# Patient Record
Sex: Male | Born: 1955
Health system: Southern US, Community
[De-identification: ages and names within clinical notes are randomized; demographics above are authoritative.]

## PROBLEM LIST (undated history)

## (undated) DIAGNOSIS — M25519 Pain in unspecified shoulder: Secondary | ICD-10-CM

## (undated) DIAGNOSIS — K219 Gastro-esophageal reflux disease without esophagitis: Secondary | ICD-10-CM

## (undated) DIAGNOSIS — I209 Angina pectoris, unspecified: Secondary | ICD-10-CM

## (undated) DIAGNOSIS — I2510 Atherosclerotic heart disease of native coronary artery without angina pectoris: Secondary | ICD-10-CM

## (undated) DIAGNOSIS — F329 Major depressive disorder, single episode, unspecified: Secondary | ICD-10-CM

## (undated) DIAGNOSIS — T7840XA Allergy, unspecified, initial encounter: Secondary | ICD-10-CM

## (undated) DIAGNOSIS — E785 Hyperlipidemia, unspecified: Secondary | ICD-10-CM

## (undated) DIAGNOSIS — F1010 Alcohol abuse, uncomplicated: Secondary | ICD-10-CM

## (undated) DIAGNOSIS — R42 Dizziness and giddiness: Secondary | ICD-10-CM

## (undated) DIAGNOSIS — Z720 Tobacco use: Secondary | ICD-10-CM

## (undated) DIAGNOSIS — F419 Anxiety disorder, unspecified: Secondary | ICD-10-CM

## (undated) DIAGNOSIS — H9190 Unspecified hearing loss, unspecified ear: Secondary | ICD-10-CM

## (undated) DIAGNOSIS — M12812 Other specific arthropathies, not elsewhere classified, left shoulder: Secondary | ICD-10-CM

## (undated) DIAGNOSIS — M1710 Unilateral primary osteoarthritis, unspecified knee: Secondary | ICD-10-CM

## (undated) DIAGNOSIS — I10 Essential (primary) hypertension: Secondary | ICD-10-CM

## (undated) DIAGNOSIS — M179 Osteoarthritis of knee, unspecified: Secondary | ICD-10-CM

## (undated) DIAGNOSIS — R918 Other nonspecific abnormal finding of lung field: Secondary | ICD-10-CM

## (undated) DIAGNOSIS — F32A Depression, unspecified: Secondary | ICD-10-CM

## (undated) DIAGNOSIS — M75102 Unspecified rotator cuff tear or rupture of left shoulder, not specified as traumatic: Secondary | ICD-10-CM

## (undated) DIAGNOSIS — J449 Chronic obstructive pulmonary disease, unspecified: Secondary | ICD-10-CM

## (undated) DIAGNOSIS — M12811 Other specific arthropathies, not elsewhere classified, right shoulder: Secondary | ICD-10-CM

## (undated) DIAGNOSIS — M75101 Unspecified rotator cuff tear or rupture of right shoulder, not specified as traumatic: Secondary | ICD-10-CM

## (undated) HISTORY — DX: Other specific arthropathies, not elsewhere classified, right shoulder: M12.811

## (undated) HISTORY — DX: Depression, unspecified: F32.A

## (undated) HISTORY — PX: ROTATOR CUFF REPAIR: SHX139

## (undated) HISTORY — PX: CATARACT EXTRACTION: SUR2

## (undated) HISTORY — DX: Major depressive disorder, single episode, unspecified: F32.9

## (undated) HISTORY — DX: Anxiety disorder, unspecified: F41.9

## (undated) HISTORY — DX: Unspecified rotator cuff tear or rupture of right shoulder, not specified as traumatic: M75.101

## (undated) HISTORY — DX: Pain in unspecified shoulder: M25.519

## (undated) HISTORY — DX: Unspecified rotator cuff tear or rupture of left shoulder, not specified as traumatic: M75.102

## (undated) HISTORY — DX: Osteoarthritis of knee, unspecified: M17.9

## (undated) HISTORY — DX: Unilateral primary osteoarthritis, unspecified knee: M17.10

## (undated) HISTORY — DX: Other specific arthropathies, not elsewhere classified, left shoulder: M12.812

## (undated) HISTORY — DX: Allergy, unspecified, initial encounter: T78.40XA

---

## 1975-01-27 HISTORY — PX: TONSILLECTOMY: SUR1361

## 1981-01-26 HISTORY — PX: HERNIA REPAIR: SHX51

## 2001-07-04 ENCOUNTER — Emergency Department (HOSPITAL_COMMUNITY): Admission: EM | Admit: 2001-07-04 | Discharge: 2001-07-04 | Payer: Self-pay | Admitting: Emergency Medicine

## 2005-06-26 HISTORY — PX: CARDIAC CATHETERIZATION: SHX172

## 2005-07-12 ENCOUNTER — Ambulatory Visit: Payer: Self-pay | Admitting: Internal Medicine

## 2005-07-12 ENCOUNTER — Inpatient Hospital Stay (HOSPITAL_COMMUNITY): Admission: AD | Admit: 2005-07-12 | Discharge: 2005-07-13 | Payer: Self-pay | Admitting: Internal Medicine

## 2005-07-12 ENCOUNTER — Encounter: Payer: Self-pay | Admitting: Emergency Medicine

## 2005-07-15 ENCOUNTER — Ambulatory Visit: Payer: Self-pay | Admitting: Gastroenterology

## 2005-07-22 ENCOUNTER — Ambulatory Visit: Payer: Self-pay | Admitting: Internal Medicine

## 2005-08-05 ENCOUNTER — Ambulatory Visit: Payer: Self-pay | Admitting: Internal Medicine

## 2005-08-21 ENCOUNTER — Ambulatory Visit: Payer: Self-pay | Admitting: Gastroenterology

## 2005-08-26 HISTORY — PX: ESOPHAGOGASTRODUODENOSCOPY: SHX1529

## 2005-09-15 ENCOUNTER — Emergency Department (HOSPITAL_COMMUNITY): Admission: EM | Admit: 2005-09-15 | Discharge: 2005-09-15 | Payer: Self-pay | Admitting: Emergency Medicine

## 2005-09-15 ENCOUNTER — Ambulatory Visit: Payer: Self-pay | Admitting: Internal Medicine

## 2006-04-14 ENCOUNTER — Emergency Department (HOSPITAL_COMMUNITY): Admission: EM | Admit: 2006-04-14 | Discharge: 2006-04-14 | Payer: Self-pay | Admitting: Emergency Medicine

## 2006-04-15 ENCOUNTER — Ambulatory Visit: Payer: Self-pay | Admitting: Orthopedic Surgery

## 2006-04-16 ENCOUNTER — Encounter (INDEPENDENT_AMBULATORY_CARE_PROVIDER_SITE_OTHER): Payer: Self-pay | Admitting: *Deleted

## 2006-04-16 ENCOUNTER — Ambulatory Visit (HOSPITAL_COMMUNITY): Admission: RE | Admit: 2006-04-16 | Discharge: 2006-04-16 | Payer: Self-pay | Admitting: Orthopedic Surgery

## 2006-04-16 ENCOUNTER — Ambulatory Visit: Payer: Self-pay | Admitting: Orthopedic Surgery

## 2006-04-17 ENCOUNTER — Emergency Department (HOSPITAL_COMMUNITY): Admission: EM | Admit: 2006-04-17 | Discharge: 2006-04-17 | Payer: Self-pay | Admitting: Emergency Medicine

## 2006-04-29 ENCOUNTER — Ambulatory Visit: Payer: Self-pay | Admitting: Orthopedic Surgery

## 2006-05-03 ENCOUNTER — Ambulatory Visit: Payer: Self-pay | Admitting: Orthopedic Surgery

## 2006-05-10 ENCOUNTER — Ambulatory Visit: Payer: Self-pay | Admitting: Orthopedic Surgery

## 2006-05-31 ENCOUNTER — Ambulatory Visit: Payer: Self-pay | Admitting: Orthopedic Surgery

## 2006-09-09 ENCOUNTER — Emergency Department (HOSPITAL_COMMUNITY): Admission: EM | Admit: 2006-09-09 | Discharge: 2006-09-09 | Payer: Self-pay | Admitting: Emergency Medicine

## 2009-11-18 ENCOUNTER — Ambulatory Visit: Payer: Self-pay | Admitting: Cardiology

## 2009-11-18 ENCOUNTER — Observation Stay (HOSPITAL_COMMUNITY): Admission: EM | Admit: 2009-11-18 | Discharge: 2009-11-19 | Payer: Self-pay | Admitting: Emergency Medicine

## 2009-12-03 ENCOUNTER — Ambulatory Visit (HOSPITAL_COMMUNITY): Admission: RE | Admit: 2009-12-03 | Discharge: 2009-12-03 | Payer: Self-pay | Admitting: Internal Medicine

## 2010-01-02 ENCOUNTER — Encounter (HOSPITAL_COMMUNITY)
Admission: RE | Admit: 2010-01-02 | Discharge: 2010-02-01 | Payer: Self-pay | Source: Home / Self Care | Attending: Pulmonary Disease | Admitting: Pulmonary Disease

## 2010-02-15 ENCOUNTER — Encounter: Payer: Self-pay | Admitting: Internal Medicine

## 2010-04-09 LAB — CBC
HCT: 39.4 % (ref 39.0–52.0)
HCT: 41.9 % (ref 39.0–52.0)
Hemoglobin: 13.5 g/dL (ref 13.0–17.0)
Hemoglobin: 14.5 g/dL (ref 13.0–17.0)
MCH: 34.5 pg — ABNORMAL HIGH (ref 26.0–34.0)
MCH: 34.5 pg — ABNORMAL HIGH (ref 26.0–34.0)
MCHC: 34.2 g/dL (ref 30.0–36.0)
MCHC: 34.5 g/dL (ref 30.0–36.0)
MCV: 100.7 fL — ABNORMAL HIGH (ref 78.0–100.0)
MCV: 99.8 fL (ref 78.0–100.0)
Platelets: 217 10*3/uL (ref 150–400)
Platelets: 221 10*3/uL (ref 150–400)
RBC: 3.91 MIL/uL — ABNORMAL LOW (ref 4.22–5.81)
RBC: 4.2 MIL/uL — ABNORMAL LOW (ref 4.22–5.81)
RDW: 12.9 % (ref 11.5–15.5)
RDW: 13.3 % (ref 11.5–15.5)
WBC: 6.9 10*3/uL (ref 4.0–10.5)
WBC: 8.3 10*3/uL (ref 4.0–10.5)

## 2010-04-09 LAB — DIFFERENTIAL
Basophils Absolute: 0 10*3/uL (ref 0.0–0.1)
Basophils Absolute: 0 10*3/uL (ref 0.0–0.1)
Basophils Relative: 0 % (ref 0–1)
Basophils Relative: 0 % (ref 0–1)
Eosinophils Absolute: 0.2 10*3/uL (ref 0.0–0.7)
Eosinophils Absolute: 0.2 10*3/uL (ref 0.0–0.7)
Eosinophils Relative: 2 % (ref 0–5)
Eosinophils Relative: 3 % (ref 0–5)
Lymphocytes Relative: 25 % (ref 12–46)
Lymphocytes Relative: 30 % (ref 12–46)
Lymphs Abs: 2 10*3/uL (ref 0.7–4.0)
Lymphs Abs: 2 10*3/uL (ref 0.7–4.0)
Monocytes Absolute: 0.7 10*3/uL (ref 0.1–1.0)
Monocytes Absolute: 0.9 10*3/uL (ref 0.1–1.0)
Monocytes Relative: 10 % (ref 3–12)
Monocytes Relative: 11 % (ref 3–12)
Neutro Abs: 3.9 10*3/uL (ref 1.7–7.7)
Neutro Abs: 5.1 10*3/uL (ref 1.7–7.7)
Neutrophils Relative %: 57 % (ref 43–77)
Neutrophils Relative %: 62 % (ref 43–77)

## 2010-04-09 LAB — CARDIAC PANEL(CRET KIN+CKTOT+MB+TROPI)
CK, MB: 2.2 ng/mL (ref 0.3–4.0)
CK, MB: 2.2 ng/mL (ref 0.3–4.0)
Relative Index: 1.4 (ref 0.0–2.5)
Relative Index: 1.5 (ref 0.0–2.5)
Total CK: 151 U/L (ref 7–232)
Total CK: 152 U/L (ref 7–232)
Troponin I: 0.01 ng/mL (ref 0.00–0.06)
Troponin I: 0.02 ng/mL (ref 0.00–0.06)

## 2010-04-09 LAB — RAPID URINE DRUG SCREEN, HOSP PERFORMED
Amphetamines: NOT DETECTED
Barbiturates: NOT DETECTED
Benzodiazepines: POSITIVE — AB
Cocaine: NOT DETECTED
Opiates: NOT DETECTED
Tetrahydrocannabinol: NOT DETECTED

## 2010-04-09 LAB — CK TOTAL AND CKMB (NOT AT ARMC)
CK, MB: 3.4 ng/mL (ref 0.3–4.0)
Relative Index: 1.6 (ref 0.0–2.5)
Total CK: 211 U/L (ref 7–232)

## 2010-04-09 LAB — COMPREHENSIVE METABOLIC PANEL
ALT: 17 U/L (ref 0–53)
AST: 23 U/L (ref 0–37)
Albumin: 3.5 g/dL (ref 3.5–5.2)
Alkaline Phosphatase: 49 U/L (ref 39–117)
BUN: 11 mg/dL (ref 6–23)
CO2: 27 mEq/L (ref 19–32)
Calcium: 8.4 mg/dL (ref 8.4–10.5)
Chloride: 109 mEq/L (ref 96–112)
Creatinine, Ser: 0.86 mg/dL (ref 0.4–1.5)
GFR calc Af Amer: >60 mL/min (ref >60)
GFR calc non Af Amer: >60 mL/min (ref >60)
Glucose, Bld: 102 mg/dL — ABNORMAL HIGH (ref 70–99)
Potassium: 4.6 mEq/L (ref 3.5–5.1)
Sodium: 141 mEq/L (ref 135–145)
Total Bilirubin: 0.5 mg/dL (ref 0.3–1.2)
Total Protein: 6.5 g/dL (ref 6.0–8.3)

## 2010-04-09 LAB — LIPID PANEL
Cholesterol: 178 mg/dL (ref 0–200)
HDL: 64 mg/dL (ref >39)
LDL Cholesterol: 96 mg/dL (ref 0–99)
Total CHOL/HDL Ratio: 2.8 RATIO
Triglycerides: 88 mg/dL (ref <150)
VLDL: 18 mg/dL (ref 0–40)

## 2010-04-09 LAB — URINALYSIS, ROUTINE W REFLEX MICROSCOPIC
Bilirubin Urine: NEGATIVE
Glucose, UA: NEGATIVE mg/dL
Hgb urine dipstick: NEGATIVE
Ketones, ur: NEGATIVE mg/dL
Nitrite: NEGATIVE
Protein, ur: NEGATIVE mg/dL
Specific Gravity, Urine: 1.025 (ref 1.005–1.030)
Urobilinogen, UA: 0.2 mg/dL (ref 0.0–1.0)
pH: 5.5 (ref 5.0–8.0)

## 2010-04-09 LAB — BASIC METABOLIC PANEL
BUN: 12 mg/dL (ref 6–23)
CO2: 24 mEq/L (ref 19–32)
Calcium: 9.1 mg/dL (ref 8.4–10.5)
Chloride: 106 mEq/L (ref 96–112)
Creatinine, Ser: 0.88 mg/dL (ref 0.4–1.5)
GFR calc Af Amer: >60 mL/min (ref >60)
GFR calc non Af Amer: >60 mL/min (ref >60)
Glucose, Bld: 90 mg/dL (ref 70–99)
Potassium: 4.3 mEq/L (ref 3.5–5.1)
Sodium: 138 mEq/L (ref 135–145)

## 2010-04-09 LAB — PROTIME-INR
INR: 0.94 (ref 0.00–1.49)
Prothrombin Time: 12.8 seconds (ref 11.6–15.2)

## 2010-04-09 LAB — TSH: TSH: 0.915 u[IU]/mL (ref 0.350–4.500)

## 2010-04-09 LAB — APTT: aPTT: 31 seconds (ref 24–37)

## 2010-04-09 LAB — TROPONIN I: Troponin I: 0.01 ng/mL (ref 0.00–0.06)

## 2010-04-09 LAB — T4, FREE: Free T4: 1.24 ng/dL (ref 0.80–1.80)

## 2010-04-09 LAB — LIPASE, BLOOD: Lipase: 29 U/L (ref 11–59)

## 2010-06-13 NOTE — H&P (Signed)
NAMESHERRIL, HEYWARD NO.:  [id number]   MEDICAL RECORD NO.:  [id number]          PATIENT TYPE:  INP   LOCATION:  2015                         FACILITY:  [facility]   PHYSICIAN:  Arvilla Meres, M.D. LHCDATE OF BIRTH:  01/07/1956   DATE OF ADMISSION:  07/12/2005  DATE OF DISCHARGE:                                HISTORY & PHYSICAL   PRIMARY CARE PHYSICIAN:  Dr. Foster Simpson.  He is new to [facility].   REASON FOR ADMISSION:  Progressive chest pain concerning for unstable  angina.   HISTORY OF PRESENT ILLNESS:  Mr. Causer is a 55 year old male smoker with a  strong family history of coronary artery disease.  He denies any previous  history of known heart disease.  He also denies any known history of  hypertension, hyperlipidemia or diabetes, though he does not follow with his  doctor regularly.  He said over the past week he has had progressive left-  sided chest discomfort.  At first he did not think much of it and thought it  was probably indigestion.  However, over the past day or two he has noted a  significant pain in his chest upon awakening.  This morning it was  particularly intense and he went to the [facility] emergency room where he  was treated with sublingual nitroglycerin with prompt resolution of his  pain.  He denies any associated shortness of breath, nausea and vomiting, or  radiation.  Currently he is chest pain free.   REVIEW OF SYSTEMS:  Notable for gastroesophageal reflux and some  indigestion.  He denies any bright red blood per rectum.  There has been no  melena.  No fevers, chills.  Denies claudication, heart failure symptoms,  palpitations, syncope, or presyncope.  The remainder of the review of  systems is negative.   PAST MEDICAL HISTORY:  1.  Tobacco use ongoing one pack per day x35 years.  2.  Alcohol use, ongoing.  3.  Gastroesophageal reflux disease.  There is no documented history of      hypertension,  hyperlipidemia or diabetes.   HOME MEDICATIONS:  None.   ALLERGIES:  None.   SOCIAL HISTORY:  He lives in [place], he is previously married.  His  children are grown.  He works as a Scientist, water quality.  He smokes 1 pack of tobacco  and has for the past 35 years. He drinks beer on a regular basis but does  not quantify well.   FAMILY HISTORY:  Mother is alive at 63 with a history of palpitations but no  known coronary disease.  Father is in his 38s.  He had his first MI at 75  and had a five-vessel bypass surgery about 14 years ago.  He has two  siblings with no documented history of coronary disease.   PHYSICAL EXAM:  GENERAL:  He is well-appearing in no acute distress.  He is  lying flat in bed.  VITAL SIGNS:  Blood pressure is 132/82. His heart rate is 54.  He is satting  in the high 90s on room air.  He  is afebrile.  HEENT: Sclerae anicteric.  EOMI.  There is no xanthelasma.  Mucous membranes  are moist.  NECK:  Supple.  JVP is about 7-8 cmH2O with CV waves.  Carotids are 2+  bilaterally without bruits, no lymphadenopathy or thyromegaly.  CARDIAC:  He is bradycardic and regular with no murmurs, rubs or gallops.  LUNGS: Clear with slightly prolonged expiratory phase.  ABDOMEN:  Soft, he has mild tenderness in the epigastrium but no rebound or  guarding.  There is no hepatosplenomegaly.  No bruits.  No masses.  EXTREMITIES:  Warm with no cyanosis, clubbing or edema.  Femoral pulses are  2+ bilaterally with no bruits.  DP pulses are 2+.  NEUROLOGIC:  He is alert and oriented x3 with a very pleasant affect.  Cranial nerves II-XII are intact.  Moves all four extremities without  difficulty.   EKG shows normal sinus rhythm at a rate of 62.  There is an RSR prime in V1  and V2.  There is T-wave inversion in V2.  Otherwise no significant ST-T  wave changes.  No old EKG is available.   LABORATORY DATA:  White count 9.7, hemoglobin of 14.9, platelets 246.  Sodium 138, potassium 3.8,  chloride 105, bicarb is 26, glucose 103, BUN 13,  creatinine 0.9.  Point of care markers; troponin was less than 0.05.  CK-MB  was 1.1.   ASSESSMENT/PLAN:  1.  Chest pain, progressive. This is concerning for unstable angina.      Especially given his family history and risk factors.  We will admit      him, treat him with IV heparin, nitroglycerin.  Also aspirin, statin and      beta blockers.  He will need cardiac catheterization tomorrow.  Given      his epigastric pain will also go ahead and check an amylase and lipase.  2.  Tobacco and alcohol use.  We have ordered a smoking cessation and      empirically treated him for DT prophylaxis.      Arvilla Meres, M.D. [facility]     DB/MEDQ  D:  07/12/2005  T:  07/13/2005  Job:  [id number]

## 2010-06-13 NOTE — Op Note (Signed)
NAMEELUTERIO, Bill Johnson              ACCOUNT NO.:  [id number]   MEDICAL RECORD NO.:  [id number]          PATIENT TYPE:  AMB   LOCATION:  DAY                           FACILITY:  [facility]   PHYSICIAN:  Vickki Hearing, M.D.DATE OF BIRTH:  02/06/1955   DATE OF PROCEDURE:  04/16/2006  DATE OF DISCHARGE:                               OPERATIVE REPORT   PREOP DIAGNOSIS:  Infection, right index finger/felon, versus bone  tumor, distal phalanx of the right index finger.  Flexor tenosynovitis, right index finger.   POSTOP DIAGNOSIS:  Felon with osteomyelitis, right index finger.   PROCEDURE:  Incision and drainage of the right index finger/felon, bone  biopsy.   SURGEON:  Dr. Fuller Canada   ASSISTANT:  None.   ANESTHETIC:  General.   SPECIMENS:  Distal phalanx bone biopsy, sent in formalin.   CULTURES:  Anaerobic and aerobic.   FINDINGS:  There was immediate purulence noted on incising the distal  pulp space.  This area was drained, irrigated and then the distal  phalanx was found to have a lytic area, which had eroded all the way to  the dorsal cortex and involved approximately 50% of the length of the  phalanx.  In fact, it went right up to the insertion of the flexor  tendon.   This area was then resected.  The remaining bone was contoured, curetted  and this bone was sent for biopsy.   BLOOD LOSS:  Minimal.   COMPLICATIONS:  None.   The patient to PACU in good condition.   PROCEDURE:  [name] was identified in the preop holding area.  We  marked the surgical site as the right index finger, countersigned by the  surgeon and the patient, as well as updated in the history and physical.  He was taken to the operating room, where he was initially given a Bier  block.  It was unsuccessful, so he had general anesthetic.   After successful anesthesia and completion of the time-out procedure, a  curved incision was made around the pulp space on the ulnar side and  immediate purulent material drained from the wound.  This was irrigated  copiously until it was clear.  It was cultured.   We then resected the distal phalanx, as the erosion had occurred up to  the dorsal cortex.  We removed bone up to the level of the flexor tendon  insertion, curetted the remaining bone and contoured it.   We then placed a Penrose drain, sewed the wound with 3-0 nylon suture  and wrapped it sterilely.   PLAN:  The patient is continued on Keflex 500 mg p.o. b.i.d. #28, Lorcet  10/650 one q. four p.r.n. #60 with one refill.  He will need a follow-up  visit for wound check and review of cultures next Tuesday or Wednesday.  I will have to get Dr. Hilda Lias to see him in my absence and the patient  will be told of this need for change of care until I have returned.      Vickki Hearing, M.D.  Electronically Signed  SEH/MEDQ  D:  04/16/2006  T:  04/16/2006  Job:  [id number]   cc:   Teola Bradley, M.D.  Fax: [phone]

## 2010-06-13 NOTE — Cardiovascular Report (Signed)
NAMEELICK, AGUILERA NO.:  [id number]   MEDICAL RECORD NO.:  [id number]          PATIENT TYPE:  INP   LOCATION:  2899                         FACILITY:  [facility]   PHYSICIAN:  Charlton Haws, M.D.     DATE OF BIRTH:  1955-05-23   DATE OF PROCEDURE:  DATE OF DISCHARGE:  07/13/2005                              CARDIAC CATHETERIZATION   INDICATIONS:  Chest pain requiring hospital admission   Catheterization was done with 6-French catheters from right femoral artery.   Left main coronary artery was normal.   Left anterior descending artery was normal.   Circumflex coronary artery was normal.   Right coronary artery was dominant and normal.   Right ventriculography showed normal ejection fraction of 60%.  There was no  gradient across the aortic valve and no MR.  Aortic pressure was 103/67.  LV  pressure was 103/4 with post A-wave EDP of 17.   Nonselective iliac injection showed [place] to be in good position for deployment  of Angio-Seal.  Collagen plug was displaced into the right iliac artery with  good hemostasis.   IMPRESSION:  The patient has no significant coronary artery disease.  He can  be discharged later today as long as his groin stays without hematoma.   He will follow up with Dr. Renard Matter.  He needs to stop smoking.           ______________________________  Charlton Haws, M.D.     PN/MEDQ  D:  07/13/2005  T:  07/14/2005  Job:  [id number]   cc:   Angus G. Renard Matter, MD  Fax: [phone]

## 2010-06-13 NOTE — Op Note (Signed)
NAMECRANFORD, BLESSINGER NO.:  [id number]   MEDICAL RECORD NO.:  [id number]          PATIENT TYPE:  AMB   LOCATION:  DAY                           FACILITY:  [facility]   PHYSICIAN:  Vickki Hearing, M.D.DATE OF BIRTH:  [birth year]   DATE OF PROCEDURE:  DATE OF DISCHARGE:                               OPERATIVE REPORT   ADDENDUM:  Once the felon had been drained, we explored the flexor  tendon by milking from proximal-to-distal and there was no purulence in  the flexor tendon sheath.      Vickki Hearing, M.D.  Electronically Signed     SEH/MEDQ  D:  04/16/2006  T:  04/16/2006  Job:  [id number]

## 2010-06-13 NOTE — Discharge Summary (Signed)
NAMEJALAN, Bill Johnson NO.:  [id number]   MEDICAL RECORD NO.:  [id number]          PATIENT TYPE:  INP   LOCATION:  2015                         FACILITY:  [facility]   PHYSICIAN:  Dorian Pod, NP    DATE OF BIRTH:  1955-11-10   DATE OF ADMISSION:  07/12/2005  DATE OF DISCHARGE:  07/13/2005                                 DISCHARGE SUMMARY   DISCHARGE DIAGNOSES:  1.  Chest pain, ruled out for myocardial infarction status post cardiac      catheterization showing no significant coronary artery disease.  2.  Ongoing tobacco use.   PAST MEDICAL HISTORY:  Includes ongoing EtOH abuse, tobacco use, and GERD.   PROCEDURES THIS ADMISSION:  Include cardiac catheterization on July 13, 2005.   HOSPITAL COURSE:  [name] is a 55 year old Caucasian with no known  history of coronary artery disease with a strong family history of coronary  artery disease who presented to [facility] emergency room complaining of  chest pain somewhat concerning for unstable angina.  He was seen by Dr.  Kittie Plater.  EKG showed normal sinus rhythm with a rate of 62 with T wave  inversion to V2, otherwise no significant ST or T wave changes.  There was  no old EKG available for comparison.  Cardiac care markers with a troponin  of less than 0.05.  The patient was admitted, treated with IV heparin and  nitroglycerin and was also started on aspirin and beta blockers, scheduled  for cardiac catheterization.  The patient taken to the cath lab on July 13, 2005, results as stated above.  The patient tolerated the procedure without  complication.  Apparently, he was discharged to home by Dr. Jodelle Red orders  when bed rest completed.  The patient apparently received cardiac  catheterization home care instructions through the cath lab holding area  and/or short stay.  The patient was not given a post cardiac catheterization  appointment.  I did not see the patient prior to discharge.   LABORATORY DATA:   During this hospitalization; lipid panel showed a total  cholesterol of 179, triglycerides 55, HDL 58, LDL 110.  TSH 1.335.  CBC; WBC  9.4, hemoglobin 13.7, hematocrit 40.7 with a platelet count of 260,000.  Chemistry showed a sodium of 139, potassium 3.9, BUN 14, creatinine 0.9, AST  23, ALT 17.  Amylase 36, lipase 29.      Dorian Pod, NP     MB/MEDQ  D:  08/05/2005  T:  08/05/2005  Job:  [id number]

## 2012-03-04 ENCOUNTER — Encounter (HOSPITAL_COMMUNITY): Payer: Self-pay | Admitting: *Deleted

## 2012-03-04 ENCOUNTER — Emergency Department (HOSPITAL_COMMUNITY)
Admission: EM | Admit: 2012-03-04 | Discharge: 2012-03-04 | Disposition: A | Discharge status: Home / Self Care | Payer: Worker's Compensation | Attending: Emergency Medicine | Admitting: Emergency Medicine

## 2012-03-04 ENCOUNTER — Emergency Department (HOSPITAL_COMMUNITY): Payer: Worker's Compensation

## 2012-03-04 DIAGNOSIS — Y929 Unspecified place or not applicable: Secondary | ICD-10-CM | POA: Insufficient documentation

## 2012-03-04 DIAGNOSIS — S6720XA Crushing injury of unspecified hand, initial encounter: Secondary | ICD-10-CM | POA: Insufficient documentation

## 2012-03-04 DIAGNOSIS — W3189XA Contact with other specified machinery, initial encounter: Secondary | ICD-10-CM | POA: Insufficient documentation

## 2012-03-04 DIAGNOSIS — Y939 Activity, unspecified: Secondary | ICD-10-CM | POA: Insufficient documentation

## 2012-03-04 DIAGNOSIS — T148XXA Other injury of unspecified body region, initial encounter: Secondary | ICD-10-CM

## 2012-03-04 MED ORDER — IBUPROFEN 800 MG PO TABS
800.0000 mg | ORAL_TABLET | Freq: Once | ORAL | Status: AC
  Administered 2012-03-04: 800 mg via ORAL
  Filled 2012-03-04: qty 1

## 2012-03-04 NOTE — ED Notes (Signed)
Patient states pain shoots from his hand all the way to his elbow. Edema noted at wrist

## 2012-03-04 NOTE — ED Notes (Signed)
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

## 2012-03-04 NOTE — ED Provider Notes (Signed)
History     CSN: [id number]  Arrival date & time 03/04/12  0451   First MD Initiated Contact with Patient 03/04/12 0501      Chief Complaint  Patient presents with  . Hand Injury    left hand was smashed by cage on a mixer. patient is a Scientist, water quality.    (Consider location/radiation/quality/duration/timing/severity/associated sxs/prior treatment) HPI Bill Johnson is a 57 y.o. male who presents to the Emergency Department complaining of a pinning injury to his left wrist in a cage from a mixer. The accident occurred t 2 PM yesterday. Hurting overnight.   PCP Dr. Renard Matter History reviewed. No pertinent past medical history.  History reviewed. No pertinent past surgical history.  History reviewed. No pertinent family history.  History  Substance Use Topics  . Smoking status: Not on file  . Smokeless tobacco: Not on file  . Alcohol Use: Not on file      Review of Systems  Constitutional: Negative for fever.       10 Systems reviewed and are negative for acute change except as noted in the HPI.  HENT: Negative for congestion.   Eyes: Negative for discharge and redness.  Respiratory: Negative for cough and shortness of breath.   Cardiovascular: Negative for chest pain.  Gastrointestinal: Negative for vomiting and abdominal pain.  Musculoskeletal: Negative for back pain.  Skin: Negative for rash.  Neurological: Negative for syncope, numbness and headaches.  Psychiatric/Behavioral:       No behavior change.    Allergies  Review of patient's allergies indicates no known allergies.  Home Medications  No current outpatient prescriptions on file.  BP 153/105  Pulse 80  Temp 98.1 F (36.7 C) (Oral)  Resp 20  Ht 5\' 7"  (1.702 m)  Wt 145 lb (65.772 kg)  BMI 22.71 kg/m2  SpO2 98%  Physical Exam  Nursing note and vitals reviewed. Constitutional:       Awake, alert, nontoxic appearance.  HENT:  Head: Atraumatic.  Eyes: Right eye exhibits no discharge. Left eye  exhibits no discharge.  Neck: Neck supple.  Cardiovascular: Normal rate.   Pulmonary/Chest: Effort normal and breath sounds normal. He exhibits no tenderness.  Abdominal: Soft. There is no tenderness. There is no rebound.  Musculoskeletal: He exhibits no tenderness.       Baseline ROM, no obvious new focal weakness.  Neurological:       Mental status and motor strength appears baseline for patient and situation.  Skin: No rash noted.       Skin intact. No bruising to the hand. No swelling noted.   Psychiatric: He has a normal mood and affect.    ED Course  Procedures (including critical care time)  Dg Wrist Complete Left  03/04/2012  *RADIOLOGY REPORT*  Clinical Data: Left wrist mashed by a cage on the cement mixture. Red and swollen.  LEFT WRIST - COMPLETE 3+ VIEW  Comparison: None.  Findings: Degenerative changes in the STT joints.  Dorsal soft tissue swelling.  No acute fracture or subluxation is appreciated. Bone cortex and trabecular architecture appear intact.  Tiny osseous fragments adjacent to the triquetrum likely representing degenerative change.  No focal bone lesion or bone destruction.  No abnormal radiopaque foreign bodies in the soft tissues.  IMPRESSION: Dorsal soft tissue swelling.  No acute bony abnormalities. Degenerative changes.   Original Report Authenticated By: Burman Nieves, M.D.      MDM  Patient with no signs of external injury. Xray with no acute  findings. Reviewed results with patient. Pt stable in ED with no significant deterioration in condition.The patient appears reasonably screened and/or stabilized for discharge and I doubt any other medical condition or other [facility] requiring further screening, evaluation, or treatment in the ED at this time prior to discharge.  MDM Reviewed: nursing note and vitals Interpretation: x-ray           Nicoletta Dress. Colon Branch, MD 03/04/12 [phone]

## 2012-03-04 NOTE — ED Notes (Signed)
Patient was given ice pack

## 2013-05-19 ENCOUNTER — Emergency Department (HOSPITAL_COMMUNITY)
Admission: EM | Admit: 2013-05-19 | Discharge: 2013-05-19 | Disposition: A | Discharge status: Home / Self Care | Payer: 59 | Attending: Emergency Medicine | Admitting: Emergency Medicine

## 2013-05-19 ENCOUNTER — Emergency Department (HOSPITAL_COMMUNITY): Payer: 59

## 2013-05-19 ENCOUNTER — Encounter (HOSPITAL_COMMUNITY): Payer: Self-pay | Admitting: Emergency Medicine

## 2013-05-19 ENCOUNTER — Other Ambulatory Visit: Payer: Self-pay

## 2013-05-19 DIAGNOSIS — Z9889 Other specified postprocedural states: Secondary | ICD-10-CM | POA: Insufficient documentation

## 2013-05-19 DIAGNOSIS — Z8719 Personal history of other diseases of the digestive system: Secondary | ICD-10-CM | POA: Insufficient documentation

## 2013-05-19 DIAGNOSIS — H81399 Other peripheral vertigo, unspecified ear: Secondary | ICD-10-CM | POA: Insufficient documentation

## 2013-05-19 DIAGNOSIS — Z791 Long term (current) use of non-steroidal anti-inflammatories (NSAID): Secondary | ICD-10-CM | POA: Insufficient documentation

## 2013-05-19 DIAGNOSIS — F17200 Nicotine dependence, unspecified, uncomplicated: Secondary | ICD-10-CM | POA: Insufficient documentation

## 2013-05-19 HISTORY — DX: Gastro-esophageal reflux disease without esophagitis: K21.9

## 2013-05-19 LAB — COMPREHENSIVE METABOLIC PANEL
ALT: 19 U/L (ref 0–53)
AST: 25 U/L (ref 0–37)
Albumin: 3.8 g/dL (ref 3.5–5.2)
Alkaline Phosphatase: 64 U/L (ref 39–117)
BUN: 15 mg/dL (ref 6–23)
CO2: 26 mEq/L (ref 19–32)
Calcium: 9.1 mg/dL (ref 8.4–10.5)
Chloride: 100 mEq/L (ref 96–112)
Creatinine, Ser: 0.82 mg/dL (ref 0.50–1.35)
GFR calc Af Amer: >90 mL/min (ref >90)
GFR calc non Af Amer: >90 mL/min (ref >90)
Glucose, Bld: 91 mg/dL (ref 70–99)
Potassium: 4.3 mEq/L (ref 3.7–5.3)
Sodium: 138 mEq/L (ref 137–147)
Total Bilirubin: 0.4 mg/dL (ref 0.3–1.2)
Total Protein: 7.4 g/dL (ref 6.0–8.3)

## 2013-05-19 LAB — URINALYSIS, ROUTINE W REFLEX MICROSCOPIC
Bilirubin Urine: NEGATIVE
Glucose, UA: NEGATIVE mg/dL
Hgb urine dipstick: NEGATIVE
Ketones, ur: NEGATIVE mg/dL
Leukocytes, UA: NEGATIVE
Nitrite: NEGATIVE
Protein, ur: NEGATIVE mg/dL
Specific Gravity, Urine: 1.01 (ref 1.005–1.030)
Urobilinogen, UA: 0.2 mg/dL (ref 0.0–1.0)
pH: 5 (ref 5.0–8.0)

## 2013-05-19 LAB — CBC
HCT: 43.2 % (ref 39.0–52.0)
Hemoglobin: 14.4 g/dL (ref 13.0–17.0)
MCH: 33.2 pg (ref 26.0–34.0)
MCHC: 33.3 g/dL (ref 30.0–36.0)
MCV: 99.5 fL (ref 78.0–100.0)
Platelets: 259 10*3/uL (ref 150–400)
RBC: 4.34 MIL/uL (ref 4.22–5.81)
RDW: 13.2 % (ref 11.5–15.5)
WBC: 7 10*3/uL (ref 4.0–10.5)

## 2013-05-19 LAB — RAPID URINE DRUG SCREEN, HOSP PERFORMED
Amphetamines: NOT DETECTED
Barbiturates: NOT DETECTED
Benzodiazepines: NOT DETECTED
Cocaine: NOT DETECTED
Opiates: NOT DETECTED
Tetrahydrocannabinol: NOT DETECTED

## 2013-05-19 LAB — DIFFERENTIAL
Basophils Absolute: 0 10*3/uL (ref 0.0–0.1)
Basophils Relative: 0 % (ref 0–1)
Eosinophils Absolute: 0.2 10*3/uL (ref 0.0–0.7)
Eosinophils Relative: 3 % (ref 0–5)
Lymphocytes Relative: 20 % (ref 12–46)
Lymphs Abs: 1.4 10*3/uL (ref 0.7–4.0)
Monocytes Absolute: 0.8 10*3/uL (ref 0.1–1.0)
Monocytes Relative: 11 % (ref 3–12)
Neutro Abs: 4.6 10*3/uL (ref 1.7–7.7)
Neutrophils Relative %: 65 % (ref 43–77)

## 2013-05-19 LAB — PROTIME-INR
INR: 0.91 (ref 0.00–1.49)
Prothrombin Time: 12.1 seconds (ref 11.6–15.2)

## 2013-05-19 LAB — I-STAT CHEM 8, ED
BUN: 14 mg/dL (ref 6–23)
Calcium, Ion: 1.14 mmol/L (ref 1.12–1.23)
Chloride: 103 mEq/L (ref 96–112)
Creatinine, Ser: 1 mg/dL (ref 0.50–1.35)
Glucose, Bld: 85 mg/dL (ref 70–99)
HCT: 47 % (ref 39.0–52.0)
Hemoglobin: 16 g/dL (ref 13.0–17.0)
Potassium: 4.2 mEq/L (ref 3.7–5.3)
Sodium: 139 mEq/L (ref 137–147)
TCO2: 24 mmol/L (ref 0–100)

## 2013-05-19 LAB — ETHANOL: Alcohol, Ethyl (B): <11 mg/dL (ref 0–11)

## 2013-05-19 LAB — APTT: aPTT: 30 seconds (ref 24–37)

## 2013-05-19 MED ORDER — DIAZEPAM 5 MG PO TABS
5.0000 mg | ORAL_TABLET | Freq: Once | ORAL | Status: AC
  Administered 2013-05-19: 5 mg via ORAL
  Filled 2013-05-19: qty 1

## 2013-05-19 MED ORDER — MECLIZINE HCL 25 MG PO TABS: 25.0000 mg | ORAL_TABLET | Freq: Three times a day (TID) | ORAL | Status: DC | PRN

## 2013-05-19 MED ORDER — MECLIZINE HCL 12.5 MG PO TABS
25.0000 mg | ORAL_TABLET | Freq: Once | ORAL | Status: AC
  Administered 2013-05-19: 25 mg via ORAL
  Filled 2013-05-19: qty 2

## 2013-05-19 NOTE — ED Notes (Signed)
Pt ambulated approximately 150 ft without difficulty. Denied dizziness and weakness.

## 2013-05-19 NOTE — Discharge Instructions (Signed)
°Emergency Department Resource Guide °1) Find a Doctor and Pay Out of Pocket °Although you won't have to find out who is covered by your insurance plan, it is a good idea to ask around and get recommendations. You will then need to call the office and see if the doctor you have chosen will accept you as a new patient and what types of options they offer for patients who are self-pay. Some doctors offer discounts or will set up payment plans for their patients who do not have insurance, but you will need to ask so you aren't surprised when you get to your appointment. ° °2) Contact Your Local Health Department °Not all health departments have doctors that can see patients for sick visits, but many do, so it is worth a call to see if yours does. If you don't know where your local health department is, you can check in your phone book. The CDC also has a tool to help you locate your state's health department, and many state websites also have listings of all of their local health departments. ° °3) Find a Walk-in Clinic °If your illness is not likely to be very severe or complicated, you may want to try a walk in clinic. These are popping up all over the country in pharmacies, drugstores, and shopping centers. They're usually staffed by nurse practitioners or physician assistants that have been trained to treat common illnesses and complaints. They're usually fairly quick and inexpensive. However, if you have serious medical issues or chronic medical problems, these are probably not your best option. ° °No Primary Care Doctor: °- Call [facility] at  832-8000 - they can help you locate a primary care doctor that  accepts your insurance, provides certain services, etc. °- Physician Referral Service- 1-800-533-3463 ° °Chronic Pain Problems: °Organization         Address  Phone   Notes  °[facility]  ([phone]) [phone] Patients need to be referred by their primary care doctor.  ° °Medication  Assistance: °Organization         Address  Phone   Notes  °[place] [facility] [address] °[place], [place] [address] (336) 641-8030 --Must be a resident of [place] °-- Must have NO insurance coverage whatsoever (no Medicaid/ Medicare, etc.) °-- The pt. MUST have a primary care doctor that directs their care regularly and follows them in the community °  °[facility]  (866) 331-1348   °[employer]  (888) 892-1162   ° °Agencies that provide inexpensive medical care: °Organization         Address  Phone   Notes  °[facility]  (336) 832-8035   ° Internal Medicine    (336) 832-7272   °[facility] [address] °[place], [place] [address] (336) 832-4777   °[facility] [address], °[place] (336) 271-4999   °[facility]    (336) 373-0678   °[facility]    (336) 272-1050   °[facility] ° [address], [place] Phone:  (336) 832-4444, Fax:  (336) 832-4440 Hours of Operation:  9 am - 6 pm, M-F.  Also accepts Medicaid/Medicare and self-pay.  °[facility] ° [address], [place] Phone: (336) [phone], Fax: (336) [phone]. Hours of Operation:  8:30 am - 5:30 pm, M-F.  Also accepts Medicaid and self-pay.  °[facility] [place] [address], [place] Phone: (336) 878-6027   °[facility] [address], [place], [place] (336)723-1848, Ext. 123 Mondays & Thursdays: 7-9 AM.  First 15 patients are seen on a first come, first serve basis. °  ° °Medicaid-accepting [place] Providers: ° °Organization         Address  Phone   Notes  °[facility] [address], [place] (336) 641-2100 Also accepts self-pay patients.  °[facility] [address], [place] ° (336) 856-9996   °[facility] [address], [place]  (336) 288-8857   °[facility] [address] [place] [address], [place] (336) 299-7000   °Veita Bland [address], [place]  ° (336) 373-1557 Only accepts [place] Access Medicaid patients after they have their name applied to their card.  ° °Self-Pay (no insurance) in [place]: ° °Organization         Address  Phone   Notes  °Sickle Cell Patients, [facility] [address], [place] (336) 832-1970   °[facility] [address], [place] (336) 832-4400   °[facility] ° [address], [place] (336) 992-4800   °[facility]/Dr. Osei-Bonsu ° [address] [place] [address], [place] or [address], [place] (336) [phone] Phone number for both [place] and [place] locations is the same.  °[facility] [address], [place] (336) 299-0000   °[facility] [address] [place] [address], [place] or [address] (336) 852-7530 °(336) 878-2260   °[facility] [address],  (336) 350-1642, phone; (336) 294-5005, fax Sees patients 1st and 3rd Saturday of every month.  Must not qualify for public or private insurance (i.e. Medicaid, Medicare, [place] Health Choice, Veterans' Benefits) • Household income should be no more than 200% of the poverty level •The clinic cannot treat you if you are pregnant or think you are pregnant • Sexually transmitted diseases are not treated at the clinic.  ° ° °Dental Care: °Organization         Address  Phone  Notes  °[place] [facility] [address],  (336) 641-6152 Accepts children up to age 21 who are enrolled in Medicaid or [place] Health Choice; pregnant women with a Medicaid card; and children who have applied for Medicaid or [place] Health Choice, but were declined, whose parents can pay a reduced fee at time of service.  °[place]  [facility] [place]  [address], [place] (336) 641-7733 Accepts children up to age 21 who are enrolled in Medicaid or [place] Health Choice; pregnant women with a Medicaid card; and children who have applied for Medicaid or [place] Health Choice, but were declined, whose parents can pay a reduced fee at time of service.  °[facility] ° [address],  (336) 641-4533 Patients are seen by appointment only. Walk-ins are not accepted. [facility] will see patients 18 years of age and older. °Monday - Tuesday (8am-5pm) °Most Wednesdays (8:30-5pm) °$30 per visit, cash only  °[facility] ° [address], [place] (336) 641-4533 Patients are seen by appointment only. Walk-ins are not accepted. [facility] will see patients 18 years of age and older. °One   Wednesday Evening (Monthly: Volunteer Based).  $30 per visit, cash only  °[facility]  (919) [phone] for adults; Children under age 4, call Graduate Pediatric Dentistry at ([phone]) [phone]. Children aged 4-14, please call ([phone]) [phone] to request a pediatric application. ° Dental services are provided in all areas of dental care including fillings, crowns and bridges, complete and partial dentures, implants, gum treatment, root canals, and extractions. Preventive care is also provided. Treatment is provided to both adults and children. °Patients are selected via a lottery and there is often a waiting list. °  °[facility] [address], °[place] ° (336) 763-8833 [email or web address].drcivils.[email or web address] °  °[facility] [address], [place], [place] (336)723-1848, Ext. 123 Second and Fourth Thursday of each month, opens at 6:30 AM; Clinic ends at 9 AM.  Patients are seen on a first-come first-served basis, and a limited number are seen during each clinic.  ° °[facility] ° [address], [place], [place] (336) [phone]    Eligibility Requirements °You must have lived in [place], [place], or [place] counties for at least the last three months. °  You cannot be eligible for state or federal sponsored healthcare insurance, including Veterans Administration, Medicaid, or Medicare. °  You generally cannot be eligible for healthcare insurance through your employer.  °  How to apply: °Eligibility screenings are held every Tuesday and Wednesday afternoon from 1:00 pm until 4:00 pm. You do not need an appointment for the interview!  °[facility] [address], [place], [place] 336-631-2330   °[facility]  336-342-8273   °[facility]  336-703-3100   °[facility]  336-570-6415   ° °Behavioral Health Resources in the Community: °Intensive Outpatient Programs °Organization         Address  Phone  Notes  °[facility] [address], [place], [place] 336-878-6098   °[facility] [address], [place], [place] 336-832-9800   °[facility] [address], [place], [place] ° 336-882-2125   °[facility] [address],  °[place], [place] 1-800-853-5163 or 336-641-4981   °Substance Abuse Resources °Organization         Address  Phone  Notes  °[facility]  336-882-2125   °[facility]  336-784-9470   °[facility]  336-285-9073   °[facility]  336-845-3988   °Residential & Outpatient Substance Abuse Program  1-800-659-3381   °Psychological Services °Organization         Address  Phone  Notes  °[facility]  336- 832-9600   °[facility]  336- 378-7881   °[facility] [address], [place] 1-800-853-5163 or 336-641-4981   ° °Mobile Crisis Teams °Organization         Address  Phone  Notes  °[facility], Mobile Crisis Care Unit  1-877-626-1772   °Assertive °[facility] ° [address]  [place], [place] 336-834-9664   °Sharon DeEsch [address] °[place]  336-554-5454   ° °Self-Help/Support Groups °Organization         Address  Phone             Notes  °[facility] [place] - variety of support groups  336- 373-1402 Call for more information  °Narcotics Anonymous (NA), [facility] [address], °[place]   2 meetings at this location  ° °  Residential Treatment Programs Organization         Address  Phone  Notes  [facility] [address],    [place]  1-317-842-4621   [facility]  [address], [place] [address], [place], [place]   [place], [place] (410)[phone] Admissions: 8am-3pm M-F  [facility] [address],    [place], [place] 539-767-3419   [facility] [address] [place], [place], [place]   [facility] [address],  [place], [place]   [facility] Intensive Outpatient [address], [place], [place]   [facility] ([place]  [place], [place] 1-336-392-7721 or 8057626534   [facility] [address] [place], [place] Accepts Medicaid  [facility] [address],  [place] 1-(281)200-1325 Substance Abuse/Addiction Treatment   [facility]         Address  Phone  Notes  [facility]  (361)489-2376   Domenic Schwab, PhD [address] [place], [place]   (873) 651-0430 or [phone]   [place], [place] (260)[phone]   [facility] [address] [place], [place] [phone] Insurance/Medicaid/sponsorship through [facility] [address]                                    [place], [place] [phone] [place] [address], [place] [phone]    Dr. Adele Schilder  [phone]   [facility] Dept. 1) [address], [place] 2) [place] 3)  [address], [place] [phone]  [phone]   [place] [phone] or [phone] (After Hours)       Take the prescription as directed. Your MRI today did not show a stroke. Call your regular medical doctor today to schedule a follow up appointment within the next 3 days.  Return to the Emergency Department immediately sooner if worsening.

## 2013-05-19 NOTE — ED Notes (Signed)
Patient reports feeling anxious about the MRI. Dr Thurnell Garbe made aware.

## 2013-05-19 NOTE — ED Notes (Signed)
Patient given discharge instruction, verbalized understand. IV removed, band aid applied. Patient ambulatory out of the department.  

## 2013-05-19 NOTE — ED Provider Notes (Signed)
CSN: [id number]     Arrival date & time 05/19/13  0808 History   First MD Initiated Contact with Patient 05/19/13 0820     Chief Complaint  Patient presents with  . Dizziness     HPI Pt was seen at [facility]. Per pt, c/o sudden onset and persistence of constant "dizziness" for the past 2 days. Pt describes the dizziness as "feeling off balance" while walking and "not being able to focus right with my eyes when looking from distance to close up." Pt states his symptoms began while he was driving in his car Wednesday (2 days ago) at 5:15pm. Pt states at that time he "felt my entire face draw up" for approx 30 seconds before spontaneously resolving. Endorses hx runny/stuffy nose, sinus and ears congestion for the past several weeks. Denies CP/palpitations, no SOB/cough, no abd pain, no N/V/D, no back or neck pain, no headache, no focal motor weakness, no tingling/numbness in extremities, no fevers, no rash, no sore throat, no injury.    Past Medical History  Diagnosis Date  . GERD (gastroesophageal reflux disease)    Past Surgical History  Procedure Laterality Date  . Cardiac catheterization  06/2005    no significant CAD  . Hernia repair     Family History  Problem Relation Age of Onset  . Stroke Other   . Cancer Other   . Heart disease Father    History  Substance Use Topics  . Smoking status: Current Every Day Smoker -- 1.00 packs/day for 43 years    Types: Cigarettes  . Smokeless tobacco: Never Used  . Alcohol Use: 28.8 oz/week    48 Cans of beer per week    Review of Systems ROS: Statement: All systems negative except as marked or noted in the HPI; Constitutional: Negative for fever and chills. ; ; Eyes: Negative for eye pain, redness and discharge. ; ; ENMT: Negative for ear pain, hoarseness, sore throat. +nasal congestion, sinus pressure and rhinorrhea. ; ; Cardiovascular: Negative for chest pain, palpitations, diaphoresis, dyspnea and peripheral edema. ; ; Respiratory: Negative  for cough, wheezing and stridor. ; ; Gastrointestinal: Negative for nausea, vomiting, diarrhea, abdominal pain, blood in stool, hematemesis, jaundice and rectal bleeding. . ; ; Genitourinary: Negative for dysuria, flank pain and hematuria. ; ; Musculoskeletal: Negative for back pain and neck pain. Negative for swelling and trauma.; ; Skin: Negative for pruritus, rash, abrasions, blisters, bruising and skin lesion.; ; Neuro: +"dizziness," "off balance." Negative for headache, lightheadedness and neck stiffness. Negative for weakness, altered level of consciousness , altered mental status, extremity weakness, paresthesias, involuntary movement, seizure and syncope.      Allergies  Review of patient's allergies indicates no known allergies.  Home Medications   Prior to Admission medications   Medication Sig Start Date End Date Taking? Authorizing Provider  acetaminophen (TYLENOL) 500 MG tablet Take 1,000 mg by mouth 2 (two) times daily as needed for moderate pain (arthritis pain).   Yes Historical Provider, MD  meloxicam (MOBIC) 15 MG tablet Take 15 mg by mouth daily.   Yes Historical Provider, MD    BP 140/78  Pulse 69  Temp(Src) 98.3 F (36.8 C) (Oral)  Resp 16  Ht 5\' 5"  (1.651 m)  Wt 145 lb (65.772 kg)  BMI 24.13 kg/m2  SpO2 97%  Physical Exam 0830: Physical examination:  Nursing notes reviewed; Vital signs and O2 SAT reviewed;  Constitutional: Well developed, Well nourished, Well hydrated, In no acute distress; Head:  Normocephalic, atraumatic; Eyes:  EOMI, PERRL, No scleral icterus; ENMT: TM's clear bilat. +edemetous nasal turbinates bilat with clear rhinorrhea. Mouth and pharynx normal, Mucous membranes moist; Neck: Supple, Full range of motion, No lymphadenopathy; Cardiovascular: Regular rate and rhythm, No murmur, rub, or gallop; Respiratory: Breath sounds clear & equal bilaterally, No rales, rhonchi, wheezes.  Speaking full sentences with ease, Normal respiratory effort/excursion;  Chest: Nontender, Movement normal; Abdomen: Soft, Nontender, Nondistended, Normal bowel sounds; Genitourinary: No CVA tenderness; Extremities: Pulses normal, No tenderness, No edema, No calf edema or asymmetry.; Neuro: AA&Ox3, Major CN grossly intact.Speech clear.  No facial droop.  +right horizontal end gaze fatigable nystagmus that reproduces pt's symptoms. Grips equal. Strength 5/5 equal bilat UE's and LE's.  DTR 2/4 equal bilat UE's and LE's.  No gross sensory deficits.  Normal cerebellar testing bilat UE's (finger-nose) and LE's (heel-shin). Climbs on and off stretcher easily by himself. Gait steady..; Skin: Color normal, Warm, Dry.; Psych:  Anxious.    ED Course  Procedures     EKG Interpretation None      MDM  MDM Reviewed: previous chart, nursing note and vitals Reviewed previous: labs Interpretation: labs, ECG, x-ray and MRI    Date: 05/19/2013  Rate: 69  Rhythm: normal sinus rhythm  QRS Axis: normal  Intervals: normal  ST/T Wave abnormalities: normal  Conduction Disutrbances:none  Narrative Interpretation:   Old EKG Reviewed: none available.   Results for orders placed during the hospital encounter of 05/19/13  ETHANOL      Result Value Ref Range   Alcohol, Ethyl (B) <11  0 - 11 mg/dL  PROTIME-INR      Result Value Ref Range   Prothrombin Time 12.1  11.6 - 15.2 seconds   INR 0.91  0.00 - 1.49  APTT      Result Value Ref Range   aPTT 30  24 - 37 seconds  CBC      Result Value Ref Range   WBC 7.0  4.0 - 10.5 K/uL   RBC 4.34  4.22 - 5.81 MIL/uL   Hemoglobin 14.4  13.0 - 17.0 g/dL   HCT 43.2  39.0 - 52.0 %   MCV 99.5  78.0 - 100.0 fL   MCH 33.2  26.0 - 34.0 pg   MCHC 33.3  30.0 - 36.0 g/dL   RDW 13.2  11.5 - 15.5 %   Platelets 259  150 - 400 K/uL  DIFFERENTIAL      Result Value Ref Range   Neutrophils Relative % 65  43 - 77 %   Neutro Abs 4.6  1.7 - 7.7 K/uL   Lymphocytes Relative 20  12 - 46 %   Lymphs Abs 1.4  0.7 - 4.0 K/uL   Monocytes Relative 11   3 - 12 %   Monocytes Absolute 0.8  0.1 - 1.0 K/uL   Eosinophils Relative 3  0 - 5 %   Eosinophils Absolute 0.2  0.0 - 0.7 K/uL   Basophils Relative 0  0 - 1 %   Basophils Absolute 0.0  0.0 - 0.1 K/uL  COMPREHENSIVE METABOLIC PANEL      Result Value Ref Range   Sodium 138  137 - 147 mEq/L   Potassium 4.3  3.7 - 5.3 mEq/L   Chloride 100  96 - 112 mEq/L   CO2 26  19 - 32 mEq/L   Glucose, Bld 91  70 - 99 mg/dL   BUN 15  6 - 23 mg/dL   Creatinine, Ser 0.82  0.50 -  1.35 mg/dL   Calcium 9.1  8.4 - 10.5 mg/dL   Total Protein 7.4  6.0 - 8.3 g/dL   Albumin 3.8  3.5 - 5.2 g/dL   AST 25  0 - 37 U/L   ALT 19  0 - 53 U/L   Alkaline Phosphatase 64  39 - 117 U/L   Total Bilirubin 0.4  0.3 - 1.2 mg/dL   GFR calc non Af Amer >90  >90 mL/min   GFR calc Af Amer >90  >90 mL/min  URINE RAPID DRUG SCREEN (HOSP PERFORMED)      Result Value Ref Range   Opiates NONE DETECTED  NONE DETECTED   Cocaine NONE DETECTED  NONE DETECTED   Benzodiazepines NONE DETECTED  NONE DETECTED   Amphetamines NONE DETECTED  NONE DETECTED   Tetrahydrocannabinol NONE DETECTED  NONE DETECTED   Barbiturates NONE DETECTED  NONE DETECTED  URINALYSIS, ROUTINE W REFLEX MICROSCOPIC      Result Value Ref Range   Color, Urine YELLOW  YELLOW   APPearance CLEAR  CLEAR   Specific Gravity, Urine 1.010  1.005 - 1.030   pH 5.0  5.0 - 8.0   Glucose, UA NEGATIVE  NEGATIVE mg/dL   Hgb urine dipstick NEGATIVE  NEGATIVE   Bilirubin Urine NEGATIVE  NEGATIVE   Ketones, ur NEGATIVE  NEGATIVE mg/dL   Protein, ur NEGATIVE  NEGATIVE mg/dL   Urobilinogen, UA 0.2  0.0 - 1.0 mg/dL   Nitrite NEGATIVE  NEGATIVE   Leukocytes, UA NEGATIVE  NEGATIVE  I-STAT CHEM 8, ED      Result Value Ref Range   Sodium 139  137 - 147 mEq/L   Potassium 4.2  3.7 - 5.3 mEq/L   Chloride 103  96 - 112 mEq/L   BUN 14  6 - 23 mg/dL   Creatinine, Ser 1.00  0.50 - 1.35 mg/dL   Glucose, Bld 85  70 - 99 mg/dL   Calcium, Ion 1.14  1.12 - 1.23 mmol/L   TCO2 24  0 - 100  mmol/L   Hemoglobin 16.0  13.0 - 17.0 g/dL   HCT 47.0  39.0 - 52.0 %   Dg Chest 2 View 05/19/2013   CLINICAL DATA:  Dizziness  EXAM: CHEST  2 VIEW  COMPARISON:  11/18/2009  FINDINGS: The heart size and mediastinal contours are within normal limits. Both lungs are clear but hyperinflated. The visualized skeletal structures are unremarkable.  IMPRESSION: COPD without acute abnormality.   Electronically Signed   By: [name] M.D.   On: 05/19/2013 09:25   Mr Brain Wo Contrast 05/19/2013   CLINICAL DATA:  Sudden onset of facial pressure with dizziness.  EXAM: MRI HEAD WITHOUT CONTRAST  TECHNIQUE: Multiplanar, multiecho pulse sequences of the brain and surrounding structures were obtained without intravenous contrast.  COMPARISON:  None.  FINDINGS: No evidence for acute infarction, hemorrhage, mass lesion, hydrocephalus, or extra-axial fluid. Normal for age cerebral volume. Scattered foci of subcortical and periventricular white matter signal abnormality, likely chronic microvascular ischemic change. Normal pituitary and cerebellar tonsils. Visualized upper cervical region unremarkable. Mild chronic sinus disease in the sphenoid, ethmoid, frontal, and maxillary regions. Left cataract extraction. Bilateral parotid gland enlargement without visible inflammation or stones. Unremarkable nasopharynx with no mastoid effusion. Visualized orbits unremarkable.  IMPRESSION: No acute intracranial findings.  Mild white matter disease, likely chronic microvascular ischemic change.  Mild chronic sinusitis.  Grossly negative orbits.  Bilateral parotid gland enlargement without visible inflammation or stones.   Electronically Signed  By: Rolla Flatten M.D.   On: 05/19/2013 10:24    1120:  Pt given antivert on arrival. Pt also given valium before MRI for anxiety. Workup reassuring; no CVA. Pt states he feels "much better now" and wants to go home. Neuro exam remains intact and unchanged. Pt has been ambulatory with  steady/upright gait, easy resps, NAD. Has tol PO well without N/V or dysphagia/choking. Will continue to tx vertigo symptomatically at this time. Dx and testing d/w pt.  Questions answered.  Verb understanding, agreeable to d/c home with outpt f/u.   Alfonzo Feller, DO 05/21/13 1259

## 2013-05-19 NOTE — ED Notes (Signed)
Patient reports driving home from work when he felt sudden pressure "behind eyes" and dizziness. Per patient face was drooping. Patient denies any slurred speech or blurred vision or weakness. Per patient facial drooping cleared but the pressure behind his eyes and dizziness stayed. Per patient this has happened before but would clear on it's own and he reports this is the first time he had facial drooping.

## 2013-05-22 LAB — I-STAT TROPONIN, ED: Troponin i, poc: 0 ng/mL (ref 0.00–0.08)

## 2013-11-27 ENCOUNTER — Encounter: Payer: Self-pay | Admitting: Internal Medicine

## 2013-12-04 ENCOUNTER — Encounter: Payer: Self-pay | Admitting: Gastroenterology

## 2013-12-04 ENCOUNTER — Encounter: Payer: Self-pay | Admitting: Internal Medicine

## 2014-04-23 ENCOUNTER — Emergency Department (HOSPITAL_COMMUNITY)
Admission: EM | Admit: 2014-04-23 | Discharge: 2014-04-23 | Discharge status: Against Medical Advice | Payer: 59 | Attending: Emergency Medicine | Admitting: Emergency Medicine

## 2014-04-23 ENCOUNTER — Encounter (HOSPITAL_COMMUNITY): Payer: Self-pay | Admitting: Emergency Medicine

## 2014-04-23 DIAGNOSIS — Z720 Tobacco use: Secondary | ICD-10-CM | POA: Insufficient documentation

## 2014-04-23 DIAGNOSIS — R42 Dizziness and giddiness: Secondary | ICD-10-CM | POA: Insufficient documentation

## 2014-04-23 DIAGNOSIS — I10 Essential (primary) hypertension: Secondary | ICD-10-CM | POA: Diagnosis not present

## 2014-04-23 HISTORY — DX: Dizziness and giddiness: R42

## 2014-04-23 HISTORY — DX: Essential (primary) hypertension: I10

## 2014-04-23 NOTE — ED Notes (Addendum)
Pt LWBS after triage. pt reports has appointment with PCP at 1540. Pt reports " ill wait and go see my doctor this afternoon." nad noted.

## 2014-04-23 NOTE — ED Notes (Addendum)
Pt reports "vertigo flare-up",high bp since Friday. Pt reports history of same in the past. Pt reports recently changed bp meds. nad noted. Speech clear. No facial asymmetry.

## 2014-10-10 ENCOUNTER — Other Ambulatory Visit (HOSPITAL_COMMUNITY): Payer: Self-pay | Admitting: Orthopedic Surgery

## 2014-10-10 DIAGNOSIS — M25512 Pain in left shoulder: Secondary | ICD-10-CM

## 2014-10-10 DIAGNOSIS — M25511 Pain in right shoulder: Secondary | ICD-10-CM

## 2014-10-24 ENCOUNTER — Ambulatory Visit (HOSPITAL_COMMUNITY): Payer: 59

## 2014-11-28 ENCOUNTER — Encounter: Payer: Self-pay | Admitting: Neurology

## 2014-11-28 ENCOUNTER — Ambulatory Visit (INDEPENDENT_AMBULATORY_CARE_PROVIDER_SITE_OTHER): Payer: 59 | Admitting: Neurology

## 2014-11-28 VITALS — BP 179/83 | HR 55 | Ht 68.0 in | Wt 147.6 lb

## 2014-11-28 DIAGNOSIS — R42 Dizziness and giddiness: Secondary | ICD-10-CM | POA: Diagnosis not present

## 2014-11-28 DIAGNOSIS — R270 Ataxia, unspecified: Secondary | ICD-10-CM | POA: Diagnosis not present

## 2014-11-28 DIAGNOSIS — R251 Tremor, unspecified: Secondary | ICD-10-CM

## 2014-11-28 DIAGNOSIS — M542 Cervicalgia: Secondary | ICD-10-CM

## 2014-11-28 DIAGNOSIS — H9193 Unspecified hearing loss, bilateral: Secondary | ICD-10-CM

## 2014-11-28 DIAGNOSIS — R569 Unspecified convulsions: Secondary | ICD-10-CM | POA: Insufficient documentation

## 2014-11-28 DIAGNOSIS — G1221 Amyotrophic lateral sclerosis: Secondary | ICD-10-CM | POA: Diagnosis not present

## 2014-11-28 DIAGNOSIS — R5382 Chronic fatigue, unspecified: Secondary | ICD-10-CM | POA: Diagnosis not present

## 2014-11-28 DIAGNOSIS — R258 Other abnormal involuntary movements: Secondary | ICD-10-CM

## 2014-11-28 DIAGNOSIS — G1229 Other motor neuron disease: Secondary | ICD-10-CM

## 2014-11-28 DIAGNOSIS — H9190 Unspecified hearing loss, unspecified ear: Secondary | ICD-10-CM | POA: Insufficient documentation

## 2014-11-28 DIAGNOSIS — H8110 Benign paroxysmal vertigo, unspecified ear: Secondary | ICD-10-CM | POA: Diagnosis not present

## 2014-11-28 NOTE — Patient Instructions (Addendum)
Remember to drink plenty of fluid, eat healthy meals and do not skip any meals. Try to eat protein with a every meal and eat a healthy snack such as fruit or nuts in between meals. Try to keep a regular sleep-wake schedule and try to exercise daily, particularly in the form of walking, 20-30 minutes a day, if you can.   As far as diagnostic testing: MRi brain and cervical cord, EEG, vestibular rehabilitation, ENT referral, lab  I would like to see you back in 3 months, sooner if we need to. Please call us with any interim questions, concerns, problems, updates or refill requests.   Please also call us for any test results so we can go over those with you on the phone.  My clinical assistant and will answer any of your questions and relay your messages to me and also relay most of my messages to you.   Our phone number is [phone]. We also have an after hours call service for urgent matters and there is a physician on-call for urgent questions. For any emergencies you know to call 911 or go to the nearest emergency room

## 2014-11-28 NOTE — Progress Notes (Addendum)
[facility]    Provider:  Dr Jaynee Eagles Referring Provider: Celene Squibb, MD Primary Care Physician:  Wende Neighbors, MD  CC:  Tremor, hearing loss, vertigo  HPI:  Bill Johnson is a 59 y.o. male here as a referral from Dr. Nevada Crane for tremor. Past medical history of depression, anxiety, high blood pressure, shoulder pain, twitching, malaise, and general joint pain, hearing loss and dizziness, GERD, benign paroxysmal vertigo, tobacco use. It started last spring. His whole body twitches. He was placed on a drug that helped. Every 45 seconds his body with jerk. This would happen only before he was going to sleep. He has had 3 episodes of his whole body "seizing". These episodes lasted for 10 seconds, and he couldn't move, he got locked up, he remembered it. He was aware the whole time. No urination or defecation or tongue biting. He has had 3 of those. He has been taking Alprazolam for these episodes. He also takes sertraline for this.Marland Kitchen He drinks 4 beers a night. He shakes in the mornings really bad but gets better as the day goes on. The jerks happen just when getting ready to go to bed as he is dozing off. He has vertigo and hearing loss. He has vertigo all the time, he takes meclizine daily which helps. The vertigo started last spring. The vertigo happens when he move head really fast left to right. The spinning lasts 2-5 minutes. Has significant hearing loss for years and has never been evaluated. Plus tinnitus. He has been very healthy and in the last year he fell apart. Hearing loss is worsening since this summer, he got up and couldn't hear, he was acutely deaf in one ear. He can't even use a phone because he can't hear anybody. He has memory problems. Can't remember peoples names. Loses focus.No bowel or bladder incontinence. He has a lot of anxiety. No tremors in the families. Difficulty writing and using utencils. Daily continuous tremor getting worse.   Reviewed notes, labs and  imaging from outside physicians, which showed: Hearing difficulty, continues postulated Caryl Comes, could not afford to go see the ENT. Rotator cuff problems bilaterally, had cortisone shots, getting MRI and likely need surgery. Tremor is worse in the morning but improves, no cogwheeling, not resting type tremor possibly benign essential tremor. Myoclonic symptoms at bedtime which includes twitching, test tensing up of all muscles, leg cramps and then when relaxes really wears mouth and he cannot sleep. Only occurs at night. No loss of consciousness. No seizure-like activity. Depressed is a physical limitations, stress with business partners that screwed him over. Not sleeping at night. Started alprazolam and sertraline.  MRI of the brain was completed in April 2015, personally reviewed and agree with the following: No acute intracranial findings.  Mild white matter disease, likely chronic microvascular ischemic change.  Mild chronic sinusitis. Grossly negative orbits.  Bilateral parotid gland enlargement without visible inflammation or stones.  Review of Systems: Patient complains of symptoms per HPI as well as the following symptoms: Fatigue, snoring, increased thirst, hearing loss, ringing in ears, spinning sensation, trouble swallowing, diarrhea, joint pain, cramps, aching muscles, incontinence, allergies, runny nose, memory loss, confusion, numbness, weakness, difficulty swallowing, dizziness, insomnia, sleepiness, shift work, restless legs, depression, too much sleep, not enough sleep, decreased energy, change in appetite, disinterest in activities, racing thoughts. Pertinent negatives per HPI. All others negative.   Social History   Social History  . Marital Status: Single    Spouse Name: N/A  .  Number of Children: 2  . Years of Education: 12   Occupational History  . Self-employed Designer, television/film set)    Social History Main Topics  . Smoking status: Current Every Day Smoker -- 1.00  packs/day for 43 years    Types: Cigarettes  . Smokeless tobacco: Never Used  . Alcohol Use: 28.8 oz/week    48 Cans of beer per week     Comment: Drinks 4 beers/day (11/28/14)  . Drug Use: No  . Sexual Activity: Not on file   Other Topics Concern  . Not on file   Social History Narrative   Lives at home with nephew and niece in-law   Caffeine use: Drinks coffee/soda every day    Family History  Problem Relation Age of Onset  . Stroke Other   . Cancer Other   . Heart disease Father     Past Medical History  Diagnosis Date  . GERD (gastroesophageal reflux disease)   . Vertigo   . Hypertension   . Depression   . Anxiety   . Shoulder pain     Past Surgical History  Procedure Laterality Date  . Cardiac catheterization  06/2005    no significant CAD  . Hernia repair  1983  . Tonsillectomy  1977    Current Outpatient Prescriptions  Medication Sig Dispense Refill  . acetaminophen (TYLENOL) 500 MG tablet Take 1,000 mg by mouth 2 (two) times daily as needed for moderate pain (arthritis pain).    Marland Kitchen ALPRAZolam (XANAX) 0.5 MG tablet Take 0.5 mg by mouth every 8 (eight) hours as needed.    Marland Kitchen aspirin 81 MG tablet Take 81 mg by mouth daily.    Marland Kitchen BYSTOLIC 10 MG tablet Take 10 mg by mouth daily.    . fexofenadine (ALLEGRA) 180 MG tablet Take 180 mg by mouth daily.    . meclizine (ANTIVERT) 25 MG tablet Take 1 tablet (25 mg total) by mouth 3 (three) times daily as needed for dizziness. 15 tablet 0  . meloxicam (MOBIC) 15 MG tablet Take 15 mg by mouth daily.    . Naproxen Sodium (ALEVE PO) Take 220 mg by mouth daily. Arthritis    . omeprazole (PRILOSEC) 20 MG capsule Take 20 mg by mouth daily.    . sertraline (ZOLOFT) 50 MG tablet Take 50 mg by mouth daily.     No current facility-administered medications for this visit.    Allergies as of 11/28/2014  . (No Known Allergies)    Vitals: BP 179/83 mmHg  Pulse 55  Ht 5\' 8"  (1.727 m)  Wt 147 lb 9.6 oz (66.951 kg)  BMI 22.45  kg/m2 Last Weight:  Wt Readings from Last 1 Encounters:  11/28/14 147 lb 9.6 oz (66.951 kg)   Last Height:   Ht Readings from Last 1 Encounters:  11/28/14 5\' 8"  (1.727 m)   Physical exam: Exam: Gen: NAD, conversant, anxious                     CV: RRR, no MRG. No Carotid Bruits. No peripheral edema, warm, nontender Eyes: Conjunctivae clear without exudates or hemorrhage  Neuro: Detailed Neurologic Exam  Speech:    Speech is normal; fluent and spontaneous with normal comprehension.  Cognition:    The patient is oriented to person, place, and time;     recent memory impaired and remote memory intact;     language fluent;     normal attention, concentration,     fund of knowledge impaired  Cranial Nerves:    The pupils are equal, round, and reactive to light. Attempted fundoscopic exam, could not visualize due to small pupils. Visual fields are full to finger confrontation. Extraocular movements are intact. Trigeminal sensation is intact and the muscles of mastication are normal. The face is symmetric. The palate elevates in the midline. Hearing significantly impaired. Voice is normal. Shoulder shrug is normal. The tongue has normal motion without fasciculations.   Coordination:    Normal finger to nose and heel to shin. Normal rapid alternating movements.   Gait:    Heel-toe gait are normal. Falls with tandem trial.  Motor Observation: high frequency, low amplitude postural tremor of the upper extremities and head.   Tone:    Normal muscle tone.    Posture: Posture is normal. normal erect   Strength: Prox arm weakness due to shoulder pain otherwise strength is V/V in the upper and lower limbs.      Sensation: intact to LT. Sways with romberg.      Reflex Exam:  DTR's:    Deep tendon reflexes in the upper and lower extremities are brisk bilaterally.   Toes:    The toes are downgoing bilaterally.   Clonus:    Few beats Clonus AJs bilat .       Assessment/Plan:   59 year old male with multiple complaints including tremor, jerking movements, imbalance and ataxia with near falls, vertigo, hearing loss, memory problems, fatigue.  For his myoclonic-type movements and episodes of "seizing" - MRI of the brain w/wo contrast Ataxia and near falls with increased reflexes and few beats clonus at the AJs - MRI of the cervical cord Myoclonic movements and "seizing": Routine EEG then a 3-day prolonged  EEG for evaluation of seizures or myoclonus Vertigo and hearing loss: Cont meclizine. ENT referral. Vestibular Therapy.  Tremor : will check TSH, CBC,CMP. Essential tremor vs enhanced physiologic tremor   Sarina Ill, MD  [facility] [address] [place], [place] [address]  Phone [phone] Fax [phone]

## 2014-11-29 ENCOUNTER — Encounter: Payer: Self-pay | Admitting: *Deleted

## 2014-11-29 NOTE — Progress Notes (Signed)
Faxed order for EEG to [facility] depart. At [phone]. Asked for pt to be scheduled. Received fax confirmation.

## 2014-11-30 LAB — CBC
Hematocrit: 44 % (ref 37.5–51.0)
Hemoglobin: 14.6 g/dL (ref 12.6–17.7)
MCH: 33.3 pg — ABNORMAL HIGH (ref 26.6–33.0)
MCHC: 33.2 g/dL (ref 31.5–35.7)
MCV: 100 fL — ABNORMAL HIGH (ref 79–97)
Platelets: 249 10*3/uL (ref 150–379)
RBC: 4.39 x10E6/uL (ref 4.14–5.80)
RDW: 14 % (ref 12.3–15.4)
WBC: 9.7 10*3/uL (ref 3.4–10.8)

## 2014-11-30 LAB — HEAVY METALS, BLOOD
Arsenic: 4 ug/L (ref 2–23)
Lead, Blood: 2 ug/dL (ref 0–19)
Mercury: NOT DETECTED ug/L (ref 0.0–14.9)

## 2014-11-30 LAB — COMPREHENSIVE METABOLIC PANEL
ALT: 17 IU/L (ref 0–44)
AST: 18 IU/L (ref 0–40)
Albumin/Globulin Ratio: 1.8 (ref 1.1–2.5)
Albumin: 4.4 g/dL (ref 3.6–4.8)
Alkaline Phosphatase: 67 IU/L (ref 39–117)
BUN/Creatinine Ratio: 15 (ref 10–22)
BUN: 11 mg/dL (ref 8–27)
Bilirubin Total: 0.3 mg/dL (ref 0.0–1.2)
CO2: 22 mmol/L (ref 18–29)
Calcium: 9.4 mg/dL (ref 8.6–10.2)
Chloride: 102 mmol/L (ref 97–106)
Creatinine, Ser: 0.74 mg/dL — ABNORMAL LOW (ref 0.76–1.27)
GFR calc Af Amer: 116 mL/min/{1.73_m2} (ref >59)
GFR calc non Af Amer: 100 mL/min/{1.73_m2} (ref >59)
Globulin, Total: 2.4 g/dL (ref 1.5–4.5)
Glucose: 94 mg/dL (ref 65–99)
Potassium: 4.9 mmol/L (ref 3.5–5.2)
Sodium: 140 mmol/L (ref 136–144)
Total Protein: 6.8 g/dL (ref 6.0–8.5)

## 2014-11-30 LAB — THYROID PANEL WITH TSH
Free Thyroxine Index: 2.2 (ref 1.2–4.9)
T3 Uptake Ratio: 30 % (ref 24–39)
T4, Total: 7.3 ug/dL (ref 4.5–12.0)
TSH: 0.774 u[IU]/mL (ref 0.450–4.500)

## 2014-12-02 ENCOUNTER — Other Ambulatory Visit: Payer: Self-pay | Admitting: Neurology

## 2014-12-02 DIAGNOSIS — E538 Deficiency of other specified B group vitamins: Secondary | ICD-10-CM

## 2014-12-03 ENCOUNTER — Telehealth: Payer: Self-pay | Admitting: *Deleted

## 2014-12-03 NOTE — Telephone Encounter (Signed)
-----   Message from Melvenia Beam, MD sent at 12/02/2014  7:03 PM EST ----- Let patient know the labs were unremarkable except something called MCv was elevated. This may be seen in B12 or folate deficiency, should probably check that. i will place the order if he can come and get that done. thanks

## 2014-12-03 NOTE — Telephone Encounter (Signed)
Spoke w/ pt niece about unremarkable labs except MCV elevated which can suggest deficiency in B12 or folate. Dr Jaynee Eagles would like him to get lab work to check this. He can come in next week to do this per Niece (hope- okay per DPR). Gave hours ok to come for lab work.

## 2014-12-03 NOTE — Telephone Encounter (Signed)
LVM for pt to call about results. Gave [facility] phone number and hours.  

## 2014-12-10 ENCOUNTER — Inpatient Hospital Stay: Admission: RE | Admit: 2014-12-10 | Payer: Self-pay | Source: Ambulatory Visit

## 2014-12-10 ENCOUNTER — Other Ambulatory Visit (INDEPENDENT_AMBULATORY_CARE_PROVIDER_SITE_OTHER): Payer: Self-pay

## 2014-12-10 DIAGNOSIS — Z0289 Encounter for other administrative examinations: Secondary | ICD-10-CM

## 2014-12-10 DIAGNOSIS — E538 Deficiency of other specified B group vitamins: Secondary | ICD-10-CM

## 2014-12-11 ENCOUNTER — Telehealth: Payer: Self-pay | Admitting: Neurology

## 2014-12-11 DIAGNOSIS — E538 Deficiency of other specified B group vitamins: Secondary | ICD-10-CM

## 2014-12-11 NOTE — Telephone Encounter (Signed)
LVM for pt to call back about results. Gave [facility] phone number and hours.  

## 2014-12-11 NOTE — Telephone Encounter (Signed)
Bill Johnson, patient's B12 was very low. This can cause many problems including dementia, anemia, neuropathy, muscle weakness, problems walking and others. He needs to start taking 1000 - 2000 mcg B12 daily po, this will be long term. We need to recheck it in 6 weeks, I will place an order. It is very important he take this daily, thanks.

## 2014-12-13 ENCOUNTER — Other Ambulatory Visit: Payer: Self-pay | Admitting: Neurology

## 2014-12-13 DIAGNOSIS — E538 Deficiency of other specified B group vitamins: Secondary | ICD-10-CM

## 2014-12-13 LAB — B12 AND FOLATE PANEL
Folate: 7.7 ng/mL (ref >3.0)
Vitamin B-12: 178 pg/mL — ABNORMAL LOW (ref 211–946)

## 2014-12-13 LAB — METHYLMALONIC ACID, SERUM: Methylmalonic Acid: 199 nmol/L (ref 0–378)

## 2014-12-13 NOTE — Telephone Encounter (Signed)
Left VM requesting patient call back for lab results and instructions from Dr Jaynee Eagles. Left this caller's name, office hours and number.

## 2014-12-13 NOTE — Telephone Encounter (Signed)
LFt vm for Hope patients niece about his b12 results. Lft message to call back about results

## 2014-12-13 NOTE — Telephone Encounter (Signed)
-----   Message from Melvenia Beam, MD sent at 12/13/2014  2:29 PM EST ----- Bill Johnson - would you call patient back and let hm know his B12 was very low. This can cause many problems including dementia, anemia, neuropathy, muscle weakness, problems walking and others. He needs to start taking 1000 - 2000 mcg B12 daily po, this will be long term. We need to recheck it in 6 weeks, I will place an order. It is very important he take this daily and comes back for repeat B12 testing, thanks.

## 2014-12-13 NOTE — Telephone Encounter (Signed)
Rn call Jeneen Rinks about his uncle labs he stated to call [facility]

## 2014-12-13 NOTE — Telephone Encounter (Signed)
-----   Message from Melvenia Beam, MD sent at 12/13/2014  2:29 PM EST ----- Elliana Bal - would you call patient back and let hm know his B12 was very low. This can cause many problems including dementia, anemia, neuropathy, muscle weakness, problems walking and others. He needs to start taking 1000 - 2000 mcg B12 daily po, this will be long term. We need to recheck it in 6 weeks, I will place an order. It is very important he take this daily and comes back for repeat B12 testing, thanks.

## 2014-12-13 NOTE — Telephone Encounter (Signed)
Rn talk to patients niece Hope about her uncles b12 levels. Rn explain that B12 was very low. This can cause many problems including dementia, anemia, neuropathy, muscle weakness, problems walking and others. He needs to start taking 1000 - 2000 mcg B12 daily po, this will be long term. We need to recheck it in 6 weeks, Pts neice verbalized understanding and will tell her uncle and get the vitamin b 12 for him. Rn explain D.Jaynee Eagles put a order in for lab work for January 29, 2015. Rn explain that patient must take 1000-2000 b12 daily and come to [facility] for lab work.Hope did say her uncle was hard of hearing at times.

## 2014-12-13 NOTE — Telephone Encounter (Signed)
-----   Message from Melvenia Beam, MD sent at 12/13/2014  2:29 PM EST ----- Katrina - would you call patient back and let hm know his B12 was very low. This can cause many problems including dementia, anemia, neuropathy, muscle weakness, problems walking and others. He needs to start taking 1000 - 2000 mcg B12 daily po, this will be long term. We need to recheck it in 6 weeks, I will place an order. It is very important he take this daily and comes back for repeat B12 testing, thanks.

## 2014-12-13 NOTE — Telephone Encounter (Signed)
LFt vm for patient to call back about his B12 results.

## 2014-12-13 NOTE — Telephone Encounter (Signed)
Patient's niece Durenda Guthrie is returning a call regarding lab results for the patient. She can be reached at [phone].

## 2014-12-17 ENCOUNTER — Other Ambulatory Visit (HOSPITAL_COMMUNITY): Payer: Self-pay

## 2014-12-24 ENCOUNTER — Ambulatory Visit (HOSPITAL_COMMUNITY): Payer: Self-pay

## 2015-02-28 ENCOUNTER — Ambulatory Visit: Payer: 59 | Admitting: Neurology

## 2015-03-14 DIAGNOSIS — Z0279 Encounter for issue of other medical certificate: Secondary | ICD-10-CM

## 2015-06-08 ENCOUNTER — Emergency Department (HOSPITAL_COMMUNITY)
Admission: EM | Admit: 2015-06-08 | Discharge: 2015-06-08 | Disposition: A | Discharge status: Home / Self Care | Payer: BLUE CROSS/BLUE SHIELD | Attending: Emergency Medicine | Admitting: Emergency Medicine

## 2015-06-08 ENCOUNTER — Encounter (HOSPITAL_COMMUNITY): Payer: Self-pay | Admitting: *Deleted

## 2015-06-08 DIAGNOSIS — Z79899 Other long term (current) drug therapy: Secondary | ICD-10-CM | POA: Diagnosis not present

## 2015-06-08 DIAGNOSIS — Z7982 Long term (current) use of aspirin: Secondary | ICD-10-CM | POA: Insufficient documentation

## 2015-06-08 DIAGNOSIS — L089 Local infection of the skin and subcutaneous tissue, unspecified: Secondary | ICD-10-CM

## 2015-06-08 DIAGNOSIS — L723 Sebaceous cyst: Secondary | ICD-10-CM | POA: Insufficient documentation

## 2015-06-08 DIAGNOSIS — F329 Major depressive disorder, single episode, unspecified: Secondary | ICD-10-CM | POA: Insufficient documentation

## 2015-06-08 DIAGNOSIS — L02416 Cutaneous abscess of left lower limb: Secondary | ICD-10-CM | POA: Diagnosis present

## 2015-06-08 DIAGNOSIS — I10 Essential (primary) hypertension: Secondary | ICD-10-CM | POA: Insufficient documentation

## 2015-06-08 DIAGNOSIS — F17210 Nicotine dependence, cigarettes, uncomplicated: Secondary | ICD-10-CM | POA: Diagnosis not present

## 2015-06-08 MED ORDER — LIDOCAINE HCL (PF) 1 % IJ SOLN
5.0000 mL | Freq: Once | INTRAMUSCULAR | Status: AC
  Administered 2015-06-08: 5 mL

## 2015-06-08 MED ORDER — LIDOCAINE HCL (PF) 1 % IJ SOLN
INTRAMUSCULAR | Status: AC
  Administered 2015-06-08: 5 mL
  Filled 2015-06-08: qty 5

## 2015-06-08 NOTE — Discharge Instructions (Signed)
Sebaceous Cyst Removal Sebaceous cyst removal is a procedure to remove a sac of oily material that forms under your skin (sebaceous cyst). Sebaceous cysts may also be called epidermoid cysts or keratin cysts. Normally, the skin secretes this oily material through a gland or a hair follicle. This type of cyst usually results when a skin gland or hair follicle becomes blocked. You may need this procedure if you have a sebaceous cyst that becomes large, uncomfortable, or infected. LET YOUR HEALTH CARE PROVIDER KNOW ABOUT:  Any allergies you have.  All medicines you are taking, including vitamins, herbs, eye drops, creams, and over-the-counter medicines.  Previous problems you or members of your family have had with the use of anesthetics.  Any blood disorders you have.  Previous surgeries you have had.  Medical conditions you have. RISKS AND COMPLICATIONS Generally, this is a safe procedure. However, problems may occur, including:  Developing another cyst.  Bleeding.  Infection.  Scarring. BEFORE THE PROCEDURE  Ask your health care provider about:  Changing or stopping your regular medicines. This is especially important if you are taking diabetes medicines or blood thinners.  Taking medicines such as aspirin and ibuprofen. These medicines can thin your blood. Do not take these medicines before your procedure if your health care provider instructs you not to.  If you have an infected cyst, you may have to take antibiotic medicines before or after the cyst removal. Take your antibiotics as directed by your health care provider. Finish all of the medicine even if you start to feel better.  Take a shower on the morning of your procedure. Your health care provider may ask you to use a germ-killing (antiseptic) soap. PROCEDURE  You will be given a medicine that numbs the area (local anesthetic).  The skin around the cyst will be cleaned with a germ-killing solution  (antiseptic).  Your health care provider will make a small surgical incision over the cyst.  The cyst will be separated from the surrounding tissues that are under your skin.  If possible, the cyst will be removed undamaged (intact).  If the cyst bursts (ruptures), it will need to be removed in pieces.  After the cyst is removed, your health care provider will control any bleeding and close the incision with small stitches (sutures). Small incisions may not need sutures, and the bleeding will be controlled by applying direct pressure with gauze.  Your health care provider may apply antibiotic ointment and a light bandage (dressing) over the incision. This procedure may vary among health care providers and hospitals. AFTER THE PROCEDURE  If your cyst ruptured during surgery, you may need to take antibiotic medicine. If you were prescribed an antibiotic medicine, finish all of it even if you start to feel better.   This information is not intended to replace advice given to you by your health care provider. Make sure you discuss any questions you have with your health care provider.   Document Released: 01/10/2000 Document Revised: 02/02/2014 Document Reviewed: 09/27/2013 Elsevier Interactive Patient Education 2016 [employer]vier Inc[employer]  

## 2015-06-08 NOTE — ED Notes (Signed)
Covered wound with sterile 4x4 gauze and wrapped with sterile wrap. Patient tolerated well.

## 2015-06-08 NOTE — ED Notes (Signed)
Pt has abscess to left lower calf. Pt states this started 10 days ago. He went to his dermatologist about it and was given doxycycline. Pt state this area has gotten worse. He denies any drainage. VS stable in triage.

## 2015-06-10 NOTE — ED Provider Notes (Signed)
CSN: [id number]     Arrival date & time 06/08/15  1514 History   First MD Initiated Contact with Patient 06/08/15 1556     Chief Complaint  Patient presents with  . Abscess     (Consider location/radiation/quality/duration/timing/severity/associated sxs/prior Treatment) The history is provided by the patient.   Bill Johnson is a 60 y.o. male presenting for evaluation of an abscess on his left lower calf which is worsened despite being placed on doxycyline (day 5) by his dermatologist.  He has been employing warm compresses without drainage or improvement. He describes a long standing "knot" at this site which he has occasionally been able to squeeze thick white drainage from.  He denies radiation of pain, fevers, chills or other complaint.     Past Medical History  Diagnosis Date  . GERD (gastroesophageal reflux disease)   . Vertigo   . Hypertension   . Depression   . Anxiety   . Shoulder pain    Past Surgical History  Procedure Laterality Date  . Cardiac catheterization  06/2005    no significant CAD  . Hernia repair  1983  . Tonsillectomy  1977   Family History  Problem Relation Age of Onset  . Stroke Other   . Cancer Other   . Heart disease Father    Social History  Substance Use Topics  . Smoking status: Current Every Day Smoker -- 1.00 packs/day for 43 years    Types: Cigarettes  . Smokeless tobacco: Never Used  . Alcohol Use: 28.8 oz/week    48 Cans of beer per week     Comment: Drinks 4 beers/day (11/28/14)    Review of Systems  Constitutional: Negative for fever and chills.  Respiratory: Negative.   Gastrointestinal: Negative for nausea.  Skin: Positive for color change and wound.  Neurological: Negative for numbness.      Allergies  Review of patient's allergies indicates no known allergies.  Home Medications   Prior to Admission medications   Medication Sig Start Date End Date Taking? Authorizing Provider  acetaminophen (TYLENOL) 500 MG  tablet Take 1,000 mg by mouth 2 (two) times daily as needed for moderate pain (arthritis pain).   Yes Historical Provider, MD  ALPRAZolam Duanne Moron) 0.5 MG tablet Take 0.5 mg by mouth at bedtime. *May take three times daily as needed for anxiety 11/07/14  Yes Historical Provider, MD  aspirin 81 MG tablet Take 81 mg by mouth daily.   Yes Historical Provider, MD  atenolol (TENORMIN) 50 MG tablet Take 50 mg by mouth daily.   Yes Historical Provider, MD  Cyanocobalamin (B-12 PO) Take 2 tablets by mouth daily.   Yes Historical Provider, MD  doxycycline (VIBRA-TABS) 100 MG tablet Take 100 mg by mouth 2 (two) times daily. 14 day course starting on 06/03/15   Yes Historical Provider, MD  meclizine (ANTIVERT) 25 MG tablet Take 1 tablet (25 mg total) by mouth 3 (three) times daily as needed for dizziness. 05/19/13  Yes Francine Graven, DO  omeprazole (PRILOSEC) 20 MG capsule Take 20 mg by mouth daily.   Yes Historical Provider, MD  sertraline (ZOLOFT) 50 MG tablet Take 100 mg by mouth daily.  11/07/14  Yes Historical Provider, MD   BP 161/88 mmHg  Pulse 66  Temp(Src) 98.4 F (36.9 C) (Oral)  Resp 16  Ht 5\' 6"  (1.676 m)  Wt 73.483 kg  BMI 26.16 kg/m2  SpO2 99% Physical Exam  Constitutional: He is oriented to person, place, and time. He  appears well-developed and well-nourished.  HENT:  Head: Normocephalic.  Cardiovascular: Normal rate.   Pulmonary/Chest: Effort normal.  Musculoskeletal: Normal range of motion.  Neurological: He is alert and oriented to person, place, and time. No sensory deficit.  Skin:  2 cm raised induration with central fluctuant pustule left mid calf.  2 cm surrounding erythema without red streaking.     ED Course  Procedures (including critical care time)  INCISION AND DRAINAGE Performed by: Evalee Jefferson Consent: Verbal consent obtained. Risks and benefits: risks, benefits and alternatives were discussed Type: abscess  Body area: left calf  Anesthesia: local  infiltration  Incision was made with a scalpel.  Local anesthetic: lidocaine 1% without epinephrine  Anesthetic total: 3 ml  Complexity: complex Blunt dissection to break up loculations  Drainage: purulent drainage and thick clumps of sebum. No core or outer membrane identified.  Drainage amount: moderate  Packing material: none Patient tolerance: Patient tolerated the procedure well with no immediate complications.    Labs Review Labs Reviewed - No data to display  Imaging Review No results found. I have personally reviewed and evaluated these images and lab results as part of my medical decision-making.   EKG Interpretation None      MDM   Final diagnoses:  Infected sebaceous cyst of skin    Dressing applied. Advised continued warm soaks bit, finish abx, f/u with Dr. [place] Crane prn   The patient appears reasonably screened and/or stabilized for discharge and I doubt any other medical condition or other [facility] requiring further screening, evaluation, or treatment in the ED at this time prior to discharge.     Evalee Jefferson, PA-C 06/10/15 [place], MD 06/10/15 [phone]

## 2016-07-01 ENCOUNTER — Encounter: Payer: Self-pay | Admitting: Family Medicine

## 2016-07-01 ENCOUNTER — Ambulatory Visit (INDEPENDENT_AMBULATORY_CARE_PROVIDER_SITE_OTHER): Payer: BLUE CROSS/BLUE SHIELD | Admitting: Family Medicine

## 2016-07-01 VITALS — BP 140/72 | HR 70 | Temp 97.8°F | Resp 14 | Ht 64.0 in | Wt 161.0 lb

## 2016-07-01 DIAGNOSIS — G8929 Other chronic pain: Secondary | ICD-10-CM | POA: Insufficient documentation

## 2016-07-01 DIAGNOSIS — H906 Mixed conductive and sensorineural hearing loss, bilateral: Secondary | ICD-10-CM | POA: Diagnosis not present

## 2016-07-01 DIAGNOSIS — I10 Essential (primary) hypertension: Secondary | ICD-10-CM | POA: Insufficient documentation

## 2016-07-01 DIAGNOSIS — R42 Dizziness and giddiness: Secondary | ICD-10-CM | POA: Diagnosis not present

## 2016-07-01 DIAGNOSIS — Z720 Tobacco use: Secondary | ICD-10-CM

## 2016-07-01 DIAGNOSIS — R251 Tremor, unspecified: Secondary | ICD-10-CM

## 2016-07-01 DIAGNOSIS — E538 Deficiency of other specified B group vitamins: Secondary | ICD-10-CM | POA: Insufficient documentation

## 2016-07-01 DIAGNOSIS — F411 Generalized anxiety disorder: Secondary | ICD-10-CM | POA: Diagnosis not present

## 2016-07-01 DIAGNOSIS — Z125 Encounter for screening for malignant neoplasm of prostate: Secondary | ICD-10-CM

## 2016-07-01 DIAGNOSIS — J449 Chronic obstructive pulmonary disease, unspecified: Secondary | ICD-10-CM

## 2016-07-01 DIAGNOSIS — G894 Chronic pain syndrome: Secondary | ICD-10-CM

## 2016-07-01 MED ORDER — ALPRAZOLAM 0.5 MG PO TABS: 0.5000 mg | ORAL_TABLET | Freq: Every evening | ORAL | 1 refills | Status: DC | PRN

## 2016-07-01 NOTE — Assessment & Plan Note (Signed)
Obtain records, called pharmacy to verify his xanax script today  Unclear why he states meds are being tapered off, not sure if he is being forthcoming of the truth. I will keep him on his current meds to prevent any withdrawel

## 2016-07-01 NOTE — Assessment & Plan Note (Signed)
Declines evaluatuon or hearing aids , he has significant hearing loss

## 2016-07-01 NOTE — Progress Notes (Signed)
Subjective:    Patient ID: Bill Johnson, male    DOB: [birth year], 61 y.o.   MRN: [id number]  Patient presents for [facility] (is fasting)  Pt here to establish care. His previous PCP was Dr. Wende Neighbors. His other family members come to this office  Derm- Dr. Allyn Kenner His history of hypertension but he also has a tremor which I assume why he is on the beta blocker. He cannot give me specifics about this. But denies any history of heart disease or heart failure.  History generalized anxiety he is more concerned would not be needed to get his nerve pill. His chart states that he is on both Zoloft as well as Xanax. He states that his last doctor who was in the same office with Dr. Nevada Crane actually started to take him off of his medications and he did not know why.  He has history of chronic pain he has been on disability for the past 2 years secondary to shoulder injury he was a Horticulturist, commercial for 30 years. He was seen by Dr. Veverly Fells he states that his shoulder injury is inoperable. He was on pain medicine in the past. He also has bilateral knee pain he has osteoarthritis of his knees Cynthia cartilage tear with chronic knee pain in his right knee since age 32.  His history of vertigo he has been seen by neurology Dr. Jaynee Eagles was noted in the chart. He uses meclizine as needed. He was also told he has B-12 deficiency he takes 2000 mg of B-12 daily.  His history of reflux he states he's had endoscopies but has never had colonoscopy. He takes Zantac to control this.  On examination he is noted to be significantly wheezy humid smoking heavily. In the past he was actually on Advair since he has an inhaler at home but did not know the name of this. After further evaluation he has to have albuterol at home. He does not want to quit smoking. States that he had a lung test done about 15 years ago.  He has had precancerous lesions removed from his face by Dr. Nevada Crane dermatology.   Review Of  Systems:  GEN- denies fatigue, fever, weight loss,weakness, recent illness HEENT- denies eye drainage, change in vision, nasal discharge, CVS- denies chest pain, palpitations RESP- denies SOB, cough, wheeze ABD- denies N/V, change in stools, abd pain GU- denies dysuria, hematuria, dribbling, incontinence MSK- + joint pain, muscle aches, injury Neuro- denies headache, dizziness, syncope, seizure activity       Objective:    BP 140/72   Pulse 70   Temp 97.8 F (36.6 C) (Oral)   Resp 14   Ht 5\' 4"  (1.626 m)   Wt 161 lb (73 kg)   SpO2 98%   BMI 27.64 kg/m  GEN- NAD, alert and oriented x3,very hard of hearing  HEENT- PERRL, EOMI, non injected sclera, pink conjunctiva, MMM, oropharynx clear Neck- Supple, no thyromegaly CVS- RRR, no murmur RESP bilat wheeze, normal WOB, no retractions, no rales  MSK- Brace on left knee, ACE wrap on right knee, Decreased ROM upper ext, bilat knees  ABD-NABS,soft,NT,ND Psych- normal affect and mood EXT- No edema Pulses- Radial 2+        Assessment & Plan:    Approx 40 minutes spent with pt, coordinating care, medication reconcilation treatment plans   Problem List Items Addressed This Visit    Vertigo - Primary    Meclizine prn  Tremor   Tobacco use   Hypertension    BP Mildly elevated, continue atenanolol obtain records from previous PCP       Relevant Orders   CBC with Differential/Platelet   Comprehensive metabolic panel   Lipid panel   Hearing loss    Declines evaluatuon or hearing aids , he has significant hearing loss      GAD (generalized anxiety disorder)    Obtain records, called pharmacy to verify his xanax script today  Unclear why he states meds are being tapered off, not sure if he is being forthcoming of the truth. I will keep him on his current meds to prevent any withdrawel       COPD (chronic obstructive pulmonary disease) (HCC)    PFT continue albuterol, discussed tobacco cessation, he is not  interested in quitting      Relevant Orders   Pulmonary function test   Chronic pain    Obtain records, unknown why his "pain" is not being treated      Relevant Medications   naproxen sodium (ANAPROX) 220 MG tablet   B12 deficiency    Other Visit Diagnoses    Prostate cancer screening       Relevant Orders   PSA   Vitamin B 12 deficiency       Relevant Orders   Vitamin B12      Note: This dictation was prepared with Dragon dictation along with smaller phrase technology. Any transcriptional errors that result from this process are unintentional.

## 2016-07-01 NOTE — Assessment & Plan Note (Signed)
PFT continue albuterol, discussed tobacco cessation, he is not interested in quitting

## 2016-07-01 NOTE — Assessment & Plan Note (Signed)
Meclizine prn 

## 2016-07-01 NOTE — Assessment & Plan Note (Signed)
Obtain records, unknown why his "pain" is not being treated

## 2016-07-01 NOTE — Assessment & Plan Note (Signed)
BP Mildly elevated, continue atenanolol obtain records from previous PCP

## 2016-07-01 NOTE — Patient Instructions (Signed)
Release of records- Dr.Zach [place] Crane- [place]  Release of records- Dermatology- Dr. Nevada Crane  We will call with lab results Pulmonary Function test to be set up F/U 3 months

## 2016-07-02 LAB — CBC WITH DIFFERENTIAL/PLATELET
Basophils Absolute: 0 cells/uL (ref 0–200)
Basophils Relative: 0 %
Eosinophils Absolute: 171 cells/uL (ref 15–500)
Eosinophils Relative: 3 %
HCT: 46 % (ref 38.5–50.0)
Hemoglobin: 14.9 g/dL (ref 13.0–17.0)
Lymphocytes Relative: 37 %
Lymphs Abs: 2109 cells/uL (ref 850–3900)
MCH: 32.5 pg (ref 27.0–33.0)
MCHC: 32.4 g/dL (ref 32.0–36.0)
MCV: 100.2 fL — ABNORMAL HIGH (ref 80.0–100.0)
MPV: 9.6 fL (ref 7.5–12.5)
Monocytes Absolute: 570 cells/uL (ref 200–950)
Monocytes Relative: 10 %
Neutro Abs: 2850 cells/uL (ref 1500–7800)
Neutrophils Relative %: 50 %
Platelets: 209 10*3/uL (ref 140–400)
RBC: 4.59 MIL/uL (ref 4.20–5.80)
RDW: 14 % (ref 11.0–15.0)
WBC: 5.7 10*3/uL (ref 3.8–10.8)

## 2016-07-02 LAB — COMPREHENSIVE METABOLIC PANEL
ALT: 19 U/L (ref 9–46)
AST: 27 U/L (ref 10–35)
Albumin: 4.1 g/dL (ref 3.6–5.1)
Alkaline Phosphatase: 58 U/L (ref 40–115)
BUN: 10 mg/dL (ref 7–25)
CO2: 24 mmol/L (ref 20–31)
Calcium: 8.8 mg/dL (ref 8.6–10.3)
Chloride: 105 mmol/L (ref 98–110)
Creat: 0.87 mg/dL (ref 0.70–1.25)
Glucose, Bld: 89 mg/dL (ref 70–99)
Potassium: 5 mmol/L (ref 3.5–5.3)
Sodium: 138 mmol/L (ref 135–146)
Total Bilirubin: 0.6 mg/dL (ref 0.2–1.2)
Total Protein: 7.1 g/dL (ref 6.1–8.1)

## 2016-07-02 LAB — LIPID PANEL
Cholesterol: 175 mg/dL (ref <200)
HDL: 90 mg/dL (ref >40)
LDL Cholesterol: 79 mg/dL (ref <100)
Total CHOL/HDL Ratio: 1.9 Ratio (ref <5.0)
Triglycerides: 32 mg/dL (ref <150)
VLDL: 6 mg/dL (ref <30)

## 2016-07-02 LAB — VITAMIN B12: Vitamin B-12: 1181 pg/mL — ABNORMAL HIGH (ref 200–1100)

## 2016-07-02 LAB — PSA: PSA: 0.5 ng/mL (ref <=4.0)

## 2016-07-03 ENCOUNTER — Encounter: Payer: Self-pay | Admitting: Family Medicine

## 2016-07-22 ENCOUNTER — Ambulatory Visit (HOSPITAL_COMMUNITY)
Admission: RE | Admit: 2016-07-22 | Discharge: 2016-07-22 | Disposition: A | Discharge status: Home / Self Care | Payer: BLUE CROSS/BLUE SHIELD | Source: Ambulatory Visit | Attending: Family Medicine | Admitting: Family Medicine

## 2016-07-22 DIAGNOSIS — J449 Chronic obstructive pulmonary disease, unspecified: Secondary | ICD-10-CM | POA: Diagnosis not present

## 2016-07-22 LAB — PULMONARY FUNCTION TEST
DL/VA % pred: 100 %
DL/VA: 4.19 ml/min/mmHg/L
DLCO cor % pred: 69 %
DLCO cor: 16.96 ml/min/mmHg
DLCO unc % pred: 69 %
DLCO unc: 16.96 ml/min/mmHg
FEF 25-75 Post: 2.56 L/sec
FEF 25-75 Pre: 1.53 L/sec
FEF2575-%Change-Post: 67 %
FEF2575-%Pred-Post: 110 %
FEF2575-%Pred-Pre: 65 %
FEV1-%Change-Post: 16 %
FEV1-%Pred-Post: 94 %
FEV1-%Pred-Pre: 81 %
FEV1-Post: 2.66 L
FEV1-Pre: 2.28 L
FEV1FVC-%Change-Post: -1 %
FEV1FVC-%Pred-Pre: 95 %
FEV6-%Change-Post: 18 %
FEV6-%Pred-Post: 106 %
FEV6-%Pred-Pre: 90 %
FEV6-Post: 3.76 L
FEV6-Pre: 3.18 L
FEV6FVC-%Change-Post: 0 %
FEV6FVC-%Pred-Post: 105 %
FEV6FVC-%Pred-Pre: 105 %
FVC-%Change-Post: 18 %
FVC-%Pred-Post: 100 %
FVC-%Pred-Pre: 85 %
FVC-Post: 3.76 L
FVC-Pre: 3.18 L
Post FEV1/FVC ratio: 71 %
Post FEV6/FVC ratio: 100 %
Pre FEV1/FVC ratio: 72 %
Pre FEV6/FVC Ratio: 100 %
RV % pred: 227 %
RV: 4.47 L
TLC % pred: 131 %
TLC: 7.64 L

## 2016-07-22 MED ORDER — ALBUTEROL SULFATE (2.5 MG/3ML) 0.083% IN NEBU
2.5000 mg | INHALATION_SOLUTION | Freq: Once | RESPIRATORY_TRACT | Status: AC
  Administered 2016-07-22: 2.5 mg via RESPIRATORY_TRACT

## 2016-07-24 ENCOUNTER — Other Ambulatory Visit: Payer: Self-pay | Admitting: *Deleted

## 2016-07-24 MED ORDER — VARENICLINE TARTRATE 0.5 MG X 11 & 1 MG X 42 PO MISC: ORAL | 0 refills | Status: DC

## 2016-07-24 MED ORDER — UMECLIDINIUM-VILANTEROL 62.5-25 MCG/INH IN AEPB: 1.0000 | INHALATION_SPRAY | Freq: Every day | RESPIRATORY_TRACT | 11 refills | Status: DC

## 2016-09-07 ENCOUNTER — Telehealth: Payer: Self-pay | Admitting: Family Medicine

## 2016-09-07 MED ORDER — ALPRAZOLAM 0.5 MG PO TABS: 0.5000 mg | ORAL_TABLET | Freq: Every evening | ORAL | 1 refills | Status: DC | PRN

## 2016-09-07 MED ORDER — ATENOLOL 50 MG PO TABS: 50.0000 mg | ORAL_TABLET | Freq: Every day | ORAL | 3 refills | Status: DC

## 2016-09-07 MED ORDER — MECLIZINE HCL 25 MG PO TABS: 25.0000 mg | ORAL_TABLET | Freq: Three times a day (TID) | ORAL | 0 refills | Status: DC | PRN

## 2016-09-07 NOTE — Telephone Encounter (Signed)
Okay to refill xanax. 

## 2016-09-07 NOTE — Telephone Encounter (Signed)
Pt wants refill on meclizine, xanax, and atenolol [facility] in [place].

## 2016-09-07 NOTE — Telephone Encounter (Signed)
Ok to refill Xanax??  Last office visit/ refill 07/01/2016, #1 refill.

## 2016-09-07 NOTE — Telephone Encounter (Signed)
Medication called to pharmacy. 

## 2016-09-24 ENCOUNTER — Encounter: Payer: Self-pay | Admitting: Family Medicine

## 2016-09-24 ENCOUNTER — Ambulatory Visit (INDEPENDENT_AMBULATORY_CARE_PROVIDER_SITE_OTHER): Payer: BLUE CROSS/BLUE SHIELD | Admitting: Family Medicine

## 2016-09-24 VITALS — BP 154/80 | HR 60 | Temp 98.2°F | Resp 16 | Ht 64.0 in | Wt 162.0 lb

## 2016-09-24 DIAGNOSIS — J449 Chronic obstructive pulmonary disease, unspecified: Secondary | ICD-10-CM | POA: Diagnosis not present

## 2016-09-24 DIAGNOSIS — R42 Dizziness and giddiness: Secondary | ICD-10-CM

## 2016-09-24 DIAGNOSIS — F411 Generalized anxiety disorder: Secondary | ICD-10-CM | POA: Diagnosis not present

## 2016-09-24 DIAGNOSIS — I10 Essential (primary) hypertension: Secondary | ICD-10-CM | POA: Diagnosis not present

## 2016-09-24 MED ORDER — ALPRAZOLAM 0.5 MG PO TABS: 0.5000 mg | ORAL_TABLET | Freq: Every evening | ORAL | 1 refills | Status: DC | PRN

## 2016-09-24 MED ORDER — ATENOLOL 50 MG PO TABS: 50.0000 mg | ORAL_TABLET | Freq: Every day | ORAL | 3 refills | Status: DC

## 2016-09-24 MED ORDER — MECLIZINE HCL 25 MG PO TABS: 25.0000 mg | ORAL_TABLET | Freq: Three times a day (TID) | ORAL | 0 refills | Status: DC | PRN

## 2016-09-24 MED ORDER — AMLODIPINE BESYLATE 5 MG PO TABS: 5.0000 mg | ORAL_TABLET | Freq: Every day | ORAL | 3 refills | Status: DC

## 2016-09-24 MED ORDER — UMECLIDINIUM-VILANTEROL 62.5-25 MCG/INH IN AEPB: 1.0000 | INHALATION_SPRAY | Freq: Every day | RESPIRATORY_TRACT | 11 refills | Status: DC

## 2016-09-24 MED ORDER — SERTRALINE HCL 25 MG PO TABS: 25.0000 mg | ORAL_TABLET | Freq: Every day | ORAL | 6 refills | Status: DC

## 2016-09-24 NOTE — Patient Instructions (Signed)
F/U 2 months   New medicaiton is norvasc for blood pressure Restart the zoloft for nerves/mood swings

## 2016-09-24 NOTE — Assessment & Plan Note (Signed)
Refilled meclizine.  

## 2016-09-24 NOTE — Progress Notes (Signed)
   Subjective:    Patient ID: Bill Johnson, male    DOB: 10/20/1955, 61 y.o.   MRN: [id number]  Patient presents for Follow-up (is not fasting) and Mood Swings (states that he has swing through being clam and angry) Patient had a follow-up chronic medical problems. He established care back in June Upon a function study done which did show COPD he was started on Anora daily he'll be had albuterol as a rescue inhaler,he has not been using the Anora regularly but has used the albuterol at least 5 times  Hypertension he is currently on atenolol  Vertigo on meclizine, needs refilled, gets spells on and off   GAD/Mood swings- he does not have the zoloft    Review Of Systems:  GEN- denies fatigue, fever, weight loss,weakness, recent illness HEENT- denies eye drainage, change in vision, nasal discharge, CVS- denies chest pain, palpitations RESP- denies SOB, cough, wheeze ABD- denies N/V, change in stools, abd pain GU- denies dysuria, hematuria, dribbling, incontinence MSK- + joint pain, muscle aches, injury Neuro- denies headache, dizziness, syncope, seizure activity       Objective:    BP (!) 154/80   Pulse 60   Temp 98.2 F (36.8 C) (Oral)   Resp 16   Ht 5\' 4"  (1.626 m)   Wt 162 lb (73.5 kg)   SpO2 96%   BMI 27.81 kg/m  GEN- NAD, alert and oriented x3 repeat 160/88 HEENT- PERRL, EOMI, non injected sclera, pink conjunctiva, MMM, oropharynx clear Neck- Supple, no thyromegaly CVS- RRR, no murmur RESP-CTAB Psych- normal affect andmood EXT- No edema Pulses- Radial 2+        Assessment & Plan:      Problem List Items Addressed This Visit      Unprioritized   Vertigo    Refilled meclizine      Hypertension    Uncontrolled, as HR already 60's will not push atenolol Add norvasc 5mg        Relevant Medications   atenolol (TENORMIN) 50 MG tablet   amLODipine (NORVASC) 5 MG tablet   GAD (generalized anxiety disorder)    Restart zoloft 25mg  Continue xanax       COPD (chronic obstructive pulmonary disease) (HCC) - Primary    Discussed use of daily Anora      Relevant Medications   umeclidinium-vilanterol (ANORO ELLIPTA) 62.5-25 MCG/INH AEPB      Note: This dictation was prepared with Dragon dictation along with smaller phrase technology. Any transcriptional errors that result from this process are unintentional.

## 2016-09-24 NOTE — Assessment & Plan Note (Signed)
Discussed use of daily Anora

## 2016-09-24 NOTE — Assessment & Plan Note (Signed)
Uncontrolled, as HR already 60's will not push atenolol Add norvasc 5mg 

## 2016-09-24 NOTE — Assessment & Plan Note (Signed)
Restart zoloft 25mg  Continue xanax

## 2016-10-02 ENCOUNTER — Ambulatory Visit: Payer: BLUE CROSS/BLUE SHIELD | Admitting: Family Medicine

## 2016-11-24 ENCOUNTER — Encounter: Payer: Self-pay | Admitting: Family Medicine

## 2016-11-24 ENCOUNTER — Ambulatory Visit (INDEPENDENT_AMBULATORY_CARE_PROVIDER_SITE_OTHER): Payer: BLUE CROSS/BLUE SHIELD | Admitting: Family Medicine

## 2016-11-24 VITALS — BP 148/82 | HR 50 | Temp 98.1°F | Resp 16 | Ht 64.0 in | Wt 165.0 lb

## 2016-11-24 DIAGNOSIS — Z23 Encounter for immunization: Secondary | ICD-10-CM | POA: Diagnosis not present

## 2016-11-24 DIAGNOSIS — J449 Chronic obstructive pulmonary disease, unspecified: Secondary | ICD-10-CM

## 2016-11-24 DIAGNOSIS — F411 Generalized anxiety disorder: Secondary | ICD-10-CM | POA: Diagnosis not present

## 2016-11-24 DIAGNOSIS — I10 Essential (primary) hypertension: Secondary | ICD-10-CM | POA: Diagnosis not present

## 2016-11-24 MED ORDER — SERTRALINE HCL 50 MG PO TABS: 50.0000 mg | ORAL_TABLET | Freq: Every day | ORAL | 2 refills | Status: DC

## 2016-11-24 MED ORDER — ALPRAZOLAM 0.5 MG PO TABS: 0.5000 mg | ORAL_TABLET | Freq: Every evening | ORAL | 2 refills | Status: DC | PRN

## 2016-11-24 NOTE — Progress Notes (Signed)
   Subjective:    Patient ID: Bill Johnson, male    DOB: 03-14-1955, 61 y.o.   MRN: [id number]  Patient presents for Follow-up (is fasting)  Pt here for intermin follow up on hypertension/Mood Last visit started on norvasc in addition to his atenolol for his BP.  COPD- he is taking [place] daily he is cut back on his smoking his breathing has been good.  States he is also cutting back on alcohol states that he only does this because he is bored  Mood Disorder- restarted on zoloft in August, noticed past few weeks more nightmares, uses xanax at bedtime,   He has chronic shoulder pain, he will be getting Medicare and Medicaid in the next few weeks and he is going to try to contact his old surgeon to see if he can restart his steroid shots.  He does think that he may have a bill there and if so he will need referral to a new orthopedist he will call and let me know what he wants to do.   Review Of Systems:  GEN- denies fatigue, fever, weight loss,weakness, recent illness HEENT- denies eye drainage, change in vision, nasal discharge, CVS- denies chest pain, palpitations RESP- denies SOB, cough, wheeze ABD- denies N/V, change in stools, abd pain GU- denies dysuria, hematuria, dribbling, incontinence MSK- +joint pain, muscle aches, injury Neuro- denies headache, dizziness, syncope, seizure activity       Objective:    BP (!) 148/82   Pulse (!) 50   Temp 98.1 F (36.7 C) (Oral)   Resp 16   Ht 5\' 4"  (1.626 m)   Wt 165 lb (74.8 kg)   SpO2 96%   BMI 28.32 kg/m  GEN- NAD, alert and oriented x3 HEENT- PERRL, EOMI, non injected sclera, pink conjunctiva, MMM, oropharynx clear Neck- Supple,  CVS- RRR, no murmur RESP-CTAB Psych- normal affect , a little anxious  EXT- No edema Pulses- Radial, DP- 2+        Assessment & Plan:      Problem List Items Addressed This Visit      Unprioritized   Hypertension    BP a little improved, he is a bit anxious in the office  setting No change to BP meds for now      GAD (generalized anxiety disorder) - Primary    Increase zoloft to 50mg  Continue xanax at bedtime Will see if mood and nightmares improve, he states mood is better during the day       Relevant Medications   sertraline (ZOLOFT) 50 MG tablet   ALPRAZolam (XANAX) 0.5 MG tablet   COPD (chronic obstructive pulmonary disease) (HCC)    Much improved, continue Anora Flu shot given       Other Visit Diagnoses    Need for immunization against influenza       Relevant Orders   Flu Vaccine QUAD 36+ mos IM (Completed)      Note: This dictation was prepared with Dragon dictation along with smaller phrase technology. Any transcriptional errors that result from this process are unintentional.

## 2016-11-24 NOTE — Assessment & Plan Note (Addendum)
BP a little improved, he is a bit anxious in the office setting No change to BP meds for now

## 2016-11-24 NOTE — Assessment & Plan Note (Signed)
Increase zoloft to 50mg  Continue xanax at bedtime Will see if mood and nightmares improve, he states mood is better during the day

## 2016-11-24 NOTE — Assessment & Plan Note (Signed)
Much improved, continue Anora Flu shot given

## 2016-11-24 NOTE — Patient Instructions (Addendum)
Call me if new referral needed Sertraline increased to 50mg  once a day  F/U 4 months for PHYSICAL

## 2017-03-25 ENCOUNTER — Telehealth: Payer: Self-pay | Admitting: Orthopedic Surgery

## 2017-03-25 NOTE — Telephone Encounter (Signed)
Patient came into the office wanting to be seen by either one of our doctors for bilateral shoulder pain. He stated he has been seen by Dr. Netta Cedars at [facility]. He said he would like to get a Cortizone shot in both of his shoulders, because he was told surgery would probably make them worse.  I explained that our office would need his records from Dr. Veverly Fells so Dr. Aline Brochure could review before scheduling an appointment. He signed a release to have his records sent to him and he is to bring them to the office. I also told him that he would need to go to [place] and get a cd with xrays and any MRI's that he has had, so Dr. Aline Brochure could review them also. I told him that as soon as Dr. Aline Brochure reviews everything and lets [place] know if he can help him or suggest other treatment. He is in agreement with this.

## 2017-03-26 ENCOUNTER — Encounter: Payer: BLUE CROSS/BLUE SHIELD | Admitting: Family Medicine

## 2017-04-21 NOTE — Telephone Encounter (Signed)
Call was made to patient on 04/01/17, 2:31pm, following Dr Ruthe Mannan review of notes received (no films received) - to notify patient that Dr Aline Brochure does not treat this medical issue.  No response had been received.  Patient came by office today, 04/21/17, and said he thought he had an appointment today with Dr Aline Brochure at 1:30p.m.  I relayed the information per Dr Ruthe Mannan note.  Apologized to patient for any miscommunication; he voiced understanding.

## 2017-06-28 ENCOUNTER — Encounter: Payer: Self-pay | Admitting: Family Medicine

## 2017-06-28 ENCOUNTER — Ambulatory Visit (INDEPENDENT_AMBULATORY_CARE_PROVIDER_SITE_OTHER): Payer: Medicare Other | Admitting: Family Medicine

## 2017-06-28 ENCOUNTER — Other Ambulatory Visit: Payer: Self-pay

## 2017-06-28 VITALS — BP 152/88 | HR 80 | Temp 98.4°F | Resp 18 | Ht 64.0 in | Wt 162.0 lb

## 2017-06-28 DIAGNOSIS — Z1159 Encounter for screening for other viral diseases: Secondary | ICD-10-CM | POA: Diagnosis not present

## 2017-06-28 DIAGNOSIS — H6123 Impacted cerumen, bilateral: Secondary | ICD-10-CM

## 2017-06-28 DIAGNOSIS — I10 Essential (primary) hypertension: Secondary | ICD-10-CM

## 2017-06-28 DIAGNOSIS — Z720 Tobacco use: Secondary | ICD-10-CM

## 2017-06-28 DIAGNOSIS — W540XXA Bitten by dog, initial encounter: Secondary | ICD-10-CM | POA: Diagnosis not present

## 2017-06-28 DIAGNOSIS — R42 Dizziness and giddiness: Secondary | ICD-10-CM | POA: Diagnosis not present

## 2017-06-28 DIAGNOSIS — H906 Mixed conductive and sensorineural hearing loss, bilateral: Secondary | ICD-10-CM

## 2017-06-28 DIAGNOSIS — Z125 Encounter for screening for malignant neoplasm of prostate: Secondary | ICD-10-CM | POA: Diagnosis not present

## 2017-06-28 DIAGNOSIS — Z23 Encounter for immunization: Secondary | ICD-10-CM | POA: Diagnosis not present

## 2017-06-28 DIAGNOSIS — F411 Generalized anxiety disorder: Secondary | ICD-10-CM | POA: Diagnosis not present

## 2017-06-28 DIAGNOSIS — M19019 Primary osteoarthritis, unspecified shoulder: Secondary | ICD-10-CM | POA: Insufficient documentation

## 2017-06-28 DIAGNOSIS — Z114 Encounter for screening for human immunodeficiency virus [HIV]: Secondary | ICD-10-CM | POA: Diagnosis not present

## 2017-06-28 DIAGNOSIS — Z0000 Encounter for general adult medical examination without abnormal findings: Secondary | ICD-10-CM | POA: Diagnosis not present

## 2017-06-28 DIAGNOSIS — S41152A Open bite of left upper arm, initial encounter: Secondary | ICD-10-CM | POA: Diagnosis not present

## 2017-06-28 DIAGNOSIS — J449 Chronic obstructive pulmonary disease, unspecified: Secondary | ICD-10-CM | POA: Diagnosis not present

## 2017-06-28 DIAGNOSIS — R4189 Other symptoms and signs involving cognitive functions and awareness: Secondary | ICD-10-CM

## 2017-06-28 DIAGNOSIS — M19011 Primary osteoarthritis, right shoulder: Secondary | ICD-10-CM

## 2017-06-28 DIAGNOSIS — M19012 Primary osteoarthritis, left shoulder: Secondary | ICD-10-CM

## 2017-06-28 MED ORDER — ALPRAZOLAM 0.5 MG PO TABS: 0.5000 mg | ORAL_TABLET | Freq: Every evening | ORAL | 2 refills | Status: DC | PRN

## 2017-06-28 MED ORDER — MECLIZINE HCL 25 MG PO TABS: 25.0000 mg | ORAL_TABLET | Freq: Three times a day (TID) | ORAL | 2 refills | Status: DC | PRN

## 2017-06-28 MED ORDER — AMOXICILLIN-POT CLAVULANATE 875-125 MG PO TABS: 1.0000 | ORAL_TABLET | Freq: Two times a day (BID) | ORAL | 0 refills | Status: DC

## 2017-06-28 MED ORDER — UMECLIDINIUM-VILANTEROL 62.5-25 MCG/INH IN AEPB: 1.0000 | INHALATION_SPRAY | Freq: Every day | RESPIRATORY_TRACT | 11 refills | Status: DC

## 2017-06-28 MED ORDER — ATENOLOL 50 MG PO TABS: 50.0000 mg | ORAL_TABLET | Freq: Every day | ORAL | 3 refills | Status: DC

## 2017-06-28 MED ORDER — SERTRALINE HCL 50 MG PO TABS: 50.0000 mg | ORAL_TABLET | Freq: Every day | ORAL | 2 refills | Status: DC
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
## 2017-06-28 NOTE — Patient Instructions (Addendum)
Tetantus given Shingles vaccine sent to pharmacy  Take antibiotics  Referral to ENT for hearing  Referral to [facility] in [place] Release of records- [place]  F/U 2 weeks for recheck

## 2017-06-29 LAB — CBC WITH DIFFERENTIAL/PLATELET
Basophils Absolute: 64 cells/uL (ref 0–200)
Basophils Relative: 0.7 %
Eosinophils Absolute: 100 cells/uL (ref 15–500)
Eosinophils Relative: 1.1 %
HCT: 41.9 % (ref 38.5–50.0)
Hemoglobin: 14.9 g/dL (ref 13.2–17.1)
Lymphs Abs: 2594 cells/uL (ref 850–3900)
MCH: 33.9 pg — ABNORMAL HIGH (ref 27.0–33.0)
MCHC: 35.6 g/dL (ref 32.0–36.0)
MCV: 95.4 fL (ref 80.0–100.0)
MPV: 10.2 fL (ref 7.5–12.5)
Monocytes Relative: 10.6 %
Neutro Abs: 5378 cells/uL (ref 1500–7800)
Neutrophils Relative %: 59.1 %
Platelets: 264 10*3/uL (ref 140–400)
RBC: 4.39 10*6/uL (ref 4.20–5.80)
RDW: 13.4 % (ref 11.0–15.0)
Total Lymphocyte: 28.5 %
WBC mixed population: 965 cells/uL — ABNORMAL HIGH (ref 200–950)
WBC: 9.1 10*3/uL (ref 3.8–10.8)

## 2017-06-29 LAB — COMPREHENSIVE METABOLIC PANEL
AG Ratio: 1.5 (calc) (ref 1.0–2.5)
ALT: 14 U/L (ref 9–46)
AST: 21 U/L (ref 10–35)
Albumin: 4.4 g/dL (ref 3.6–5.1)
Alkaline phosphatase (APISO): 80 U/L (ref 40–115)
BUN: 13 mg/dL (ref 7–25)
CO2: 23 mmol/L (ref 20–32)
Calcium: 9.5 mg/dL (ref 8.6–10.3)
Chloride: 102 mmol/L (ref 98–110)
Creat: 0.89 mg/dL (ref 0.70–1.25)
Globulin: 3 g/dL (calc) (ref 1.9–3.7)
Glucose, Bld: 88 mg/dL (ref 65–99)
Potassium: 5.2 mmol/L (ref 3.5–5.3)
Sodium: 137 mmol/L (ref 135–146)
Total Bilirubin: 0.8 mg/dL (ref 0.2–1.2)
Total Protein: 7.4 g/dL (ref 6.1–8.1)

## 2017-06-29 LAB — LIPID PANEL
Cholesterol: 201 mg/dL — ABNORMAL HIGH (ref <200)
HDL: 69 mg/dL (ref >40)
LDL Cholesterol (Calc): 107 mg/dL (calc) — ABNORMAL HIGH
Non-HDL Cholesterol (Calc): 132 mg/dL (calc) — ABNORMAL HIGH (ref <130)
Total CHOL/HDL Ratio: 2.9 (calc) (ref <5.0)
Triglycerides: 142 mg/dL (ref <150)

## 2017-06-29 LAB — HEPATITIS C ANTIBODY
Hepatitis C Ab: NONREACTIVE
SIGNAL TO CUT-OFF: 0.03 (ref <1.00)

## 2017-06-29 LAB — HIV ANTIBODY (ROUTINE TESTING W REFLEX): HIV 1&2 Ab, 4th Generation: NONREACTIVE

## 2017-06-29 LAB — PSA: PSA: 0.6 ng/mL (ref <=4.0)

## 2017-07-01 ENCOUNTER — Encounter: Payer: Self-pay | Admitting: *Deleted

## 2017-07-12 ENCOUNTER — Ambulatory Visit: Payer: Medicare Other | Admitting: Family Medicine

## 2017-07-13 ENCOUNTER — Encounter: Payer: Self-pay | Admitting: Family Medicine

## 2017-07-13 ENCOUNTER — Other Ambulatory Visit: Payer: Self-pay

## 2017-07-13 ENCOUNTER — Ambulatory Visit (INDEPENDENT_AMBULATORY_CARE_PROVIDER_SITE_OTHER): Payer: Medicare Other | Admitting: Family Medicine

## 2017-07-13 VITALS — BP 164/88 | HR 58 | Temp 98.6°F | Resp 16 | Ht 64.0 in | Wt 164.0 lb

## 2017-07-13 DIAGNOSIS — R413 Other amnesia: Secondary | ICD-10-CM | POA: Diagnosis not present

## 2017-07-13 DIAGNOSIS — W540XXD Bitten by dog, subsequent encounter: Secondary | ICD-10-CM

## 2017-07-13 NOTE — Progress Notes (Signed)
   Subjective:    Patient ID: Bill Johnson, male    DOB: 1955-07-15, 62 y.o.   MRN: [id number]  Patient presents for Follow-up and Dog Bite (has completed ABTx, but area continues to be painful, red and warm to touch around bite) She had a follow-up dog bite.  He was seen 2 weeks ago he had a dog bite for a few weeks did not seek care.  I gave him Augmentin recommended that he go see plastic surgery at that time but he did not want to.  The drainage has decreased but there is been no change in the size of the dog bite it is not healing over.  He is now ready to go to plastic surgery.  Note he did stop with the peroxide as this was making it worse.  We also discussed his memory a little bit at the last visit.  Want to have his memory tested.  States he does forget some names and some small things around the house.    Review Of Systems:  GEN- denies fatigue, fever, weight loss,weakness, recent illness HEENT- denies eye drainage, change in vision, nasal discharge, CVS- denies chest pain, palpitations RESP- denies SOB, cough, wheeze ABD- denies N/V, change in stools, abd pain GU- denies dysuria, hematuria, dribbling, incontinence MSK- denies joint pain, muscle aches, injury Neuro- denies headache, dizziness, syncope, seizure activity       Objective:    BP (!) 164/88   Pulse (!) 58   Temp 98.6 F (37 C) (Oral)   Resp 16   Ht 5\' 4"  (1.626 m)   Wt 164 lb (74.4 kg)   SpO2 95%   BMI 28.15 kg/m  GEN- NAD, alert and oriented x3 CVS- RRR, no murmur RESP-CTAB Skin- left forearm- 4x2cm jagged lacereation, subcuteanous fat missing at center, yellow/green dry discharge, erythema around borders, no visible tendons  Neuro-CNII-XII in tact, MMSE 30        Assessment & Plan:      Problem List Items Addressed This Visit    None    Visit Diagnoses    Dog bite, subsequent encounter    -  Primary   I think that this may need a graft in order for it to heal properly.  He would be  sent to plastic surgery.  He has had tetanus and antibiotics.   Memory change       His Mini-Mental status was completely normal.  I did not think that he needs any memory medication at this time      Note: This dictation was prepared with Dragon dictation along with smaller phrase technology. Any transcriptional errors that result from this process are unintentional.

## 2017-07-13 NOTE — Patient Instructions (Addendum)
F/U 3 months MBD

## 2017-07-14 ENCOUNTER — Encounter: Payer: Self-pay | Admitting: Family Medicine

## 2017-07-21 DIAGNOSIS — L905 Scar conditions and fibrosis of skin: Secondary | ICD-10-CM | POA: Diagnosis not present

## 2017-07-27 DIAGNOSIS — M75102 Unspecified rotator cuff tear or rupture of left shoulder, not specified as traumatic: Secondary | ICD-10-CM | POA: Diagnosis not present

## 2017-07-27 DIAGNOSIS — M25511 Pain in right shoulder: Secondary | ICD-10-CM | POA: Diagnosis not present

## 2017-07-27 DIAGNOSIS — M75101 Unspecified rotator cuff tear or rupture of right shoulder, not specified as traumatic: Secondary | ICD-10-CM | POA: Diagnosis not present

## 2017-07-27 DIAGNOSIS — M25512 Pain in left shoulder: Secondary | ICD-10-CM | POA: Diagnosis not present

## 2017-10-13 ENCOUNTER — Encounter: Payer: Self-pay | Admitting: Physician Assistant

## 2017-10-13 ENCOUNTER — Ambulatory Visit (INDEPENDENT_AMBULATORY_CARE_PROVIDER_SITE_OTHER): Payer: Medicare Other | Admitting: Physician Assistant

## 2017-10-13 VITALS — BP 160/92 | HR 61 | Temp 98.1°F | Resp 20 | Ht 64.0 in | Wt 176.0 lb

## 2017-10-13 DIAGNOSIS — I10 Essential (primary) hypertension: Secondary | ICD-10-CM | POA: Diagnosis not present

## 2017-10-13 DIAGNOSIS — H833X3 Noise effects on inner ear, bilateral: Secondary | ICD-10-CM

## 2017-10-13 DIAGNOSIS — F411 Generalized anxiety disorder: Secondary | ICD-10-CM | POA: Diagnosis not present

## 2017-10-13 DIAGNOSIS — J439 Emphysema, unspecified: Secondary | ICD-10-CM

## 2017-10-13 MED ORDER — AMLODIPINE BESYLATE 5 MG PO TABS: 5.0000 mg | ORAL_TABLET | Freq: Every day | ORAL | 11 refills | Status: DC

## 2017-10-13 MED ORDER — ALPRAZOLAM 0.5 MG PO TABS: 0.5000 mg | ORAL_TABLET | Freq: Every evening | ORAL | 2 refills | Status: DC | PRN

## 2017-10-13 MED ORDER — ALBUTEROL SULFATE HFA 108 (90 BASE) MCG/ACT IN AERS: 2.0000 | INHALATION_SPRAY | Freq: Four times a day (QID) | RESPIRATORY_TRACT | 2 refills | Status: DC | PRN

## 2017-10-13 MED ORDER — UMECLIDINIUM-VILANTEROL 62.5-25 MCG/INH IN AEPB: 1.0000 | INHALATION_SPRAY | Freq: Every day | RESPIRATORY_TRACT | 11 refills | Status: DC

## 2017-10-13 NOTE — Progress Notes (Signed)
Patient ID: Bill Johnson MRN: [id number], DOB: 1955-02-20, 62 y.o. Date of Encounter: @DATE @  Chief Complaint:  Chief Complaint  Patient presents with  . Hypertension  . Anxiety  . 3 month follow up    HPI: 62 y.o. year old male  presents for routine f/u OV.    He sees Dr. [name].  She is currently out on maternity leave so he was scheduled to see me in the interim.  I reviewed his last office visit note with her.  NOTE: HE IS VERY HARD OF HEARING. Have to speak loudly and so that he can read lips.   He has COPD.  He brings in box for his a Anoro and states that he has been using this but needs refill.  Also brings in an old inhaler for pro-air and states that he had been prescribed that in the past but ith is expired and currently does not have current Rx for this and is requesting Rx for this.  He states that he is not taking the Zoloft.  I asked him about this he states that it ran out about a month ago and that he does not think he needs to continue that anymore.  States that he is not noticing any significant anxiety since being off of the Zoloft.   States that at age 62 he was put on disability secondary to his shoulder.   Says "it was rough at first but now of morning to deal with it ".   Therefore does not feel that he needs any Zoloft anymore.   States that he only uses Xanax 1 at bedtime.  Is requesting refill for the Xanax.  Reviewed that BP is high today.  Reviewed the last several office visits --- BP was also elevated at recent visits as well.   States that he is taking his atenolol daily as directed.     Past Medical History:  Diagnosis Date  . Allergy    seasonal  . Anxiety   . Depression   . GERD (gastroesophageal reflux disease)   . Hypertension   . OA (osteoarthritis) of knee   . Rotator cuff tear arthropathy of both shoulders    On DISABILITY, non operative case  . Shoulder pain   . Vertigo      Home Meds: Outpatient Medications Prior to  Visit  Medication Sig Dispense Refill  . aspirin 81 MG tablet Take 81 mg by mouth daily.    Marland Kitchen atenolol (TENORMIN) 50 MG tablet Take 1 tablet (50 mg total) by mouth daily. 90 tablet 3  . Cyanocobalamin (B-12 PO) Take 2 tablets by mouth daily.    . meclizine (ANTIVERT) 25 MG tablet Take 1 tablet (25 mg total) by mouth 3 (three) times daily as needed for dizziness. 90 tablet 2  . Menthol-Camphor (TIGER BALM ARTHRITIS RUB) 11-11 % CREA Apply topically.    . naproxen sodium (ANAPROX) 220 MG tablet Take 220 mg by mouth 2 (two) times daily with a meal.    . ranitidine (ZANTAC) 150 MG capsule Take 150 mg by mouth 2 (two) times daily.    Marland Kitchen ALPRAZolam (XANAX) 0.5 MG tablet Take 1 tablet (0.5 mg total) by mouth at bedtime as needed for anxiety. 30 tablet 2  . umeclidinium-vilanterol (ANORO ELLIPTA) 62.5-25 MCG/INH AEPB Inhale 1 puff into the lungs daily. 1 each 11  . sertraline (ZOLOFT) 50 MG tablet Take 1 tablet (50 mg total) by mouth at bedtime. (Patient not taking: Reported  on 10/13/2017) 90 tablet 2   No facility-administered medications prior to visit.     Allergies: No Known Allergies  Social History   Socioeconomic History  . Marital status: Single    Spouse name: Not on file  . Number of children: 2  . Years of education: 54  . Highest education level: Not on file  Occupational History  . Occupation: Film/video editor Designer, television/film set)  Social Needs  . Financial resource strain: Not on file  . Food insecurity:    Worry: Not on file    Inability: Not on file  . Transportation needs:    Medical: Not on file    Non-medical: Not on file  Tobacco Use  . Smoking status: Current Every Day Smoker    Packs/day: 1.00    Years: 43.00    Pack years: 43.00    Types: Cigarettes  . Smokeless tobacco: Never Used  Substance and Sexual Activity  . Alcohol use: Yes    Alcohol/week: 48.0 standard drinks    Types: 48 Cans of beer per week    Comment: Drinks 4 beers/day (11/28/14)  . Drug use: No  .  Sexual activity: Yes  Lifestyle  . Physical activity:    Days per week: Not on file    Minutes per session: Not on file  . Stress: Not on file  Relationships  . Social connections:    Talks on phone: Not on file    Gets together: Not on file    Attends religious service: Not on file    Active member of club or organization: Not on file    Attends meetings of clubs or organizations: Not on file    Relationship status: Not on file  . Intimate partner violence:    Fear of current or ex partner: Not on file    Emotionally abused: Not on file    Physically abused: Not on file    Forced sexual activity: Not on file  Other Topics Concern  . Not on file  Social History Narrative   Lives at home with nephew and niece in-law   Caffeine use: Drinks coffee/soda every day    Family History  Problem Relation Age of Onset  . Heart disease Mother   . Diabetes Mother   . Hypertension Mother   . Heart disease Father   . Hypertension Father   . Stroke Other   . Cancer Other   . Cancer Brother      Review of Systems:  See HPI for pertinent ROS. All other ROS negative.    Physical Exam: Blood pressure (!) 160/92, pulse 61, temperature 98.1 F (36.7 C), temperature source Oral, resp. rate 20, height 5\' 4"  (1.626 m), weight 79.8 kg, SpO2 99 %., Body mass index is 30.21 kg/m. General: WM. Appears in no acute distress. Neck: Supple. No thyromegaly. No lymphadenopathy. Lungs: Clear bilaterally to auscultation without wheezes, rales, or rhonchi. Breathing is unlabored. Heart: RRR with S1 S2. No murmurs, rubs, or gallops. Musculoskeletal:  Strength and tone normal for age. Extremities/Skin: Warm and dry.  Neuro: Alert and oriented X 3. Moves all extremities spontaneously. Gait is normal. CNII-XII grossly in tact. Psych:  Responds to questions appropriately with a normal affect.     ASSESSMENT AND PLAN:  62 y.o. year old male with   1. Essential hypertension Continue atenolol.  Add  Norvasc 5 mg daily.  Return for follow-up visit in 2 weeks to recheck BP on additional medicine. - amLODipine (NORVASC) 5 MG tablet;  Take 1 tablet (5 mg total) by mouth daily.  Dispense: 30 tablet; Refill: 11  2. Pulmonary emphysema, unspecified emphysema type ([place]) Stable, controlled.  Continue Anoro.  Also will give albuterol to have available to use if needed. - umeclidinium-vilanterol (ANORO ELLIPTA) 62.5-25 MCG/INH AEPB; Inhale 1 puff into the lungs daily.  Dispense: 1 each; Refill: 11 - albuterol (PROVENTIL HFA;VENTOLIN HFA) 108 (90 Base) MCG/ACT inhaler; Inhale 2 puffs into the lungs every 6 (six) hours as needed for wheezing or shortness of breath.  Dispense: 1 Inhaler; Refill: 2  3. Noise-induced hearing loss of both ears Dr. Dorian Heckle note documents that he did see the ENT regarding this several years ago and was to get hearing aids.  4. GAD (generalized anxiety disorder) He has recently discontinued Zoloft.  Feels that he is stable without the Zoloft and only needs Xanax 1 nightly. - ALPRAZolam (XANAX) 0.5 MG tablet; Take 1 tablet (0.5 mg total) by mouth at bedtime as needed for anxiety.  Dispense: 30 tablet; Refill: 2  Plan follow-up visit in 2 weeks to recheck BP.  [address] [place], [place], [facility] 10/13/2017 2:32 PM

## 2017-10-27 ENCOUNTER — Encounter: Payer: Self-pay | Admitting: Physician Assistant

## 2017-10-27 ENCOUNTER — Ambulatory Visit (INDEPENDENT_AMBULATORY_CARE_PROVIDER_SITE_OTHER): Payer: Medicare Other | Admitting: Physician Assistant

## 2017-10-27 VITALS — BP 118/60 | HR 54 | Temp 98.3°F | Resp 18 | Ht 64.0 in | Wt 178.4 lb

## 2017-10-27 DIAGNOSIS — I10 Essential (primary) hypertension: Secondary | ICD-10-CM

## 2017-10-27 DIAGNOSIS — Z23 Encounter for immunization: Secondary | ICD-10-CM | POA: Diagnosis not present

## 2017-10-27 NOTE — Progress Notes (Signed)
Patient ID: ASHOK SAWAYA MRN: [id number], DOB: [birth year], 62 y.o. Date of Encounter: @DATE @  Chief Complaint:  No chief complaint on file.   HPI: 62 y.o. year old male     10/13/2017: presents for routine f/u OV.   He sees Dr. [name].  She is currently out on maternity leave so he was scheduled to see me in the interim.  I reviewed his last office visit note with her.  NOTE: HE IS VERY HARD OF HEARING. Have to speak loudly and so that he can read lips.   He has COPD.  He brings in box for his a Anoro and states that he has been using this but needs refill.  Also brings in an old inhaler for pro-air and states that he had been prescribed that in the past but ith is expired and currently does not have current Rx for this and is requesting Rx for this.  He states that he is not taking the Zoloft.  I asked him about this he states that it ran out about a month ago and that he does not think he needs to continue that anymore.  States that he is not noticing any significant anxiety since being off of the Zoloft.   States that at age 24 he was put on disability secondary to his shoulder.   Says "it was rough at first but now of morning to deal with it ".   Therefore does not feel that he needs any Zoloft anymore.   States that he only uses Xanax 1 at bedtime.  Is requesting refill for the Xanax.  Reviewed that BP is high today.  Reviewed the last several office visits --- BP was also elevated at recent visits as well.   States that he is taking his atenolol daily as directed. A/P AT THAT OV 10/13/2017 INCLUDED: 1. Essential hypertension Continue atenolol.  Add Norvasc 5 mg daily.  Return for follow-up visit in 2 weeks to recheck BP on additional medicine.    10/27/2017: Today he reports he did add the Norvasc 5 mg daily.  Is taking this in addition to the atenolol.  This is causing no adverse effects.  He is having no lightheadedness.  No lower extremity edema. He has not had his  blood pressure checked anywhere since last visit here.     Past Medical History:  Diagnosis Date  . Allergy    seasonal  . Anxiety   . Depression   . GERD (gastroesophageal reflux disease)   . Hypertension   . OA (osteoarthritis) of knee   . Rotator cuff tear arthropathy of both shoulders    On DISABILITY, non operative case  . Shoulder pain   . Vertigo      Home Meds: Outpatient Medications Prior to Visit  Medication Sig Dispense Refill  . albuterol (PROVENTIL HFA;VENTOLIN HFA) 108 (90 Base) MCG/ACT inhaler Inhale 2 puffs into the lungs every 6 (six) hours as needed for wheezing or shortness of breath. 1 Inhaler 2  . ALPRAZolam (XANAX) 0.5 MG tablet Take 1 tablet (0.5 mg total) by mouth at bedtime as needed for anxiety. 30 tablet 2  . amLODipine (NORVASC) 5 MG tablet Take 1 tablet (5 mg total) by mouth daily. 30 tablet 11  . aspirin 81 MG tablet Take 81 mg by mouth daily.    Marland Kitchen atenolol (TENORMIN) 50 MG tablet Take 1 tablet (50 mg total) by mouth daily. 90 tablet 3  . Cyanocobalamin (B-12 PO) Take  2 tablets by mouth daily.    . meclizine (ANTIVERT) 25 MG tablet Take 1 tablet (25 mg total) by mouth 3 (three) times daily as needed for dizziness. 90 tablet 2  . Menthol-Camphor (TIGER BALM ARTHRITIS RUB) 11-11 % CREA Apply topically.    . naproxen sodium (ANAPROX) 220 MG tablet Take 220 mg by mouth 2 (two) times daily with a meal.    . ranitidine (ZANTAC) 150 MG capsule Take 150 mg by mouth 2 (two) times daily.    Marland Kitchen umeclidinium-vilanterol (ANORO ELLIPTA) 62.5-25 MCG/INH AEPB Inhale 1 puff into the lungs daily. 1 each 11   No facility-administered medications prior to visit.     Allergies: No Known Allergies  Social History   Socioeconomic History  . Marital status: Single    Spouse name: Not on file  . Number of children: 2  . Years of education: 4  . Highest education level: Not on file  Occupational History  . Occupation: Film/video editor Designer, television/film set)  Social Needs  .  Financial resource strain: Not on file  . Food insecurity:    Worry: Not on file    Inability: Not on file  . Transportation needs:    Medical: Not on file    Non-medical: Not on file  Tobacco Use  . Smoking status: Current Every Day Smoker    Packs/day: 1.00    Years: 43.00    Pack years: 43.00    Types: Cigarettes  . Smokeless tobacco: Never Used  Substance and Sexual Activity  . Alcohol use: Yes    Alcohol/week: 48.0 standard drinks    Types: 48 Cans of beer per week    Comment: Drinks 4 beers/day (11/28/14)  . Drug use: No  . Sexual activity: Yes  Lifestyle  . Physical activity:    Days per week: Not on file    Minutes per session: Not on file  . Stress: Not on file  Relationships  . Social connections:    Talks on phone: Not on file    Gets together: Not on file    Attends religious service: Not on file    Active member of club or organization: Not on file    Attends meetings of clubs or organizations: Not on file    Relationship status: Not on file  . Intimate partner violence:    Fear of current or ex partner: Not on file    Emotionally abused: Not on file    Physically abused: Not on file    Forced sexual activity: Not on file  Other Topics Concern  . Not on file  Social History Narrative   Lives at home with nephew and niece in-law   Caffeine use: Drinks coffee/soda every day    Family History  Problem Relation Age of Onset  . Heart disease Mother   . Diabetes Mother   . Hypertension Mother   . Heart disease Father   . Hypertension Father   . Stroke Other   . Cancer Other   . Cancer Brother      Review of Systems:  See HPI for pertinent ROS. All other ROS negative.    Physical Exam: Blood pressure 118/60, pulse (!) 54, temperature 98.3 F (36.8 C), temperature source Oral, resp. rate 18, height 5\' 4"  (1.626 m), weight 80.9 kg, SpO2 92 %., Body mass index is 30.62 kg/m. General:  WNWD WM. Appears in no acute distress. Neck: Supple. No  thyromegaly. No lymphadenopathy. Lungs: Clear bilaterally to auscultation without wheezes, rales,  or rhonchi. Breathing is unlabored. Heart: RRR with S1 S2. No murmurs, rubs, or gallops. Musculoskeletal:  Strength and tone normal for age. Extremities/Skin: Warm and dry.  No LE edema.  Neuro: Alert and oriented X 3. Moves all extremities spontaneously. Gait is normal. CNII-XII grossly in tact. Psych:  Responds to questions appropriately with a normal affect.      ASSESSMENT AND PLAN:  62 y.o. year old male with    1. Essential hypertension Blood Pressure is now controlled, at goal.  Continue current meds.        Marin Olp [place], [place], [facility] 10/27/2017 2:04 PM

## 2017-11-15 ENCOUNTER — Other Ambulatory Visit: Payer: Self-pay | Admitting: Family Medicine

## 2018-01-24 ENCOUNTER — Telehealth: Payer: Self-pay | Admitting: Family Medicine

## 2018-01-24 DIAGNOSIS — F411 Generalized anxiety disorder: Secondary | ICD-10-CM

## 2018-01-24 MED ORDER — ALPRAZOLAM 0.5 MG PO TABS: 0.5000 mg | ORAL_TABLET | Freq: Every evening | ORAL | 2 refills | Status: DC | PRN

## 2018-01-24 NOTE — Telephone Encounter (Signed)
Received fax from [facility] in [place] in [place] 0.5 mg tab  Qty:30  RJG:YLUD 1 tablet by mouth at bedtime as needed for anxiety  Last filled: 12/25/2017  Physician: Olean Ree Dixon  Patient's phone number:[phone]

## 2018-01-24 NOTE — Telephone Encounter (Signed)
Medication refilled

## 2018-01-28 ENCOUNTER — Telehealth: Payer: Self-pay | Admitting: Family Medicine

## 2018-01-28 NOTE — Telephone Encounter (Signed)
Patient requesting refill on his xanax  [facility] [place]

## 2018-01-28 NOTE — Telephone Encounter (Signed)
Medication was sent to pharmacy on 01/24/2018.  Refill will not be sent in at this time.

## 2018-02-01 ENCOUNTER — Ambulatory Visit: Payer: Medicare Other | Admitting: Family Medicine

## 2018-03-28 ENCOUNTER — Telehealth: Payer: Self-pay | Admitting: Family Medicine

## 2018-03-28 MED ORDER — MECLIZINE HCL 25 MG PO TABS: ORAL_TABLET | ORAL | 2 refills | Status: DC

## 2018-03-28 NOTE — Telephone Encounter (Signed)
Prescription sent to pharmacy.

## 2018-03-28 NOTE — Telephone Encounter (Signed)
Meclizine to [facility]

## 2018-04-29 ENCOUNTER — Other Ambulatory Visit: Payer: Self-pay

## 2018-04-29 ENCOUNTER — Ambulatory Visit (INDEPENDENT_AMBULATORY_CARE_PROVIDER_SITE_OTHER): Payer: Medicare Other | Admitting: Family Medicine

## 2018-04-29 DIAGNOSIS — Z720 Tobacco use: Secondary | ICD-10-CM

## 2018-04-29 DIAGNOSIS — F411 Generalized anxiety disorder: Secondary | ICD-10-CM

## 2018-04-29 DIAGNOSIS — I10 Essential (primary) hypertension: Secondary | ICD-10-CM

## 2018-04-29 DIAGNOSIS — J439 Emphysema, unspecified: Secondary | ICD-10-CM

## 2018-04-29 MED ORDER — ALPRAZOLAM 0.5 MG PO TABS: 0.5000 mg | ORAL_TABLET | Freq: Every evening | ORAL | 3 refills | Status: DC | PRN

## 2018-04-29 MED ORDER — ATENOLOL 50 MG PO TABS: 50.0000 mg | ORAL_TABLET | Freq: Every day | ORAL | 3 refills | Status: DC

## 2018-04-29 MED ORDER — MECLIZINE HCL 25 MG PO TABS: ORAL_TABLET | ORAL | 2 refills | Status: DC

## 2018-04-29 MED ORDER — AMLODIPINE BESYLATE 5 MG PO TABS: 5.0000 mg | ORAL_TABLET | Freq: Every day | ORAL | 3 refills | Status: DC

## 2018-04-29 NOTE — Progress Notes (Signed)
   Subjective:    Patient ID: Bill Johnson, male    DOB: 23-Sep-1955, 63 y.o.   MRN: [id number]  Patient presents for Medication Management    Pt here to f/u chronic medical problems.    No concerns    HTN- taking norvasc/ atenolol , had some mild swelling initially but now resolved      COPD- using Anora once a day, has not needed albuterol, down to 6-8cig a day    GAD- needs refilled on xanax     TakING OTC acid reliever     Pt scheduled for phone visit, but has difficulty hearing on the phone so he came in  Review Of Systems:  GEN- denies fatigue, fever, weight loss,weakness, recent illness HEENT- denies eye drainage, change in vision, nasal discharge, CVS- denies chest pain, palpitations RESP- denies SOB, cough, wheeze ABD- denies N/V, change in stools, abd pain GU- denies dysuria, hematuria, dribbling, incontinence MSK- denies joint pain, muscle aches, injury Neuro- denies headache, dizziness, syncope, seizure activity       Objective:    BP 120/80   Pulse 67   Temp 98.4 F (36.9 C)   Resp 14   Ht 5\' 4"  (1.626 m)   Wt 175 lb (79.4 kg)   SpO2 96%   BMI 30.04 kg/m  GEN- NAD, alert and oriented x3 HEENT- PERRL, EOMI, non injected sclera, pink conjunctiva, MMM, oropharynx clear Neck- Supple, no thyromegaly CVS- RRR, no murmur RESP-CTAB EXT- No edema Pulses- Radial 2+        Assessment & Plan:      Problem List Items Addressed This Visit      Unprioritized   COPD (chronic obstructive pulmonary disease) (HCC)    Currently stable has not needed albuterol, continue Anora      GAD (generalized anxiety disorder)    No change to benzo, no sign of overuse      Relevant Medications   ALPRAZolam (XANAX) 0.5 MG tablet   Hypertension    Controlled no change to meds continue norvasc and atenolol      Relevant Medications   amLODipine (NORVASC) 5 MG tablet   atenolol (TENORMIN) 50 MG tablet   Tobacco use    Has cut down on tobacco use          Note: This dictation was prepared with Dragon dictation along with smaller phrase technology. Any transcriptional errors that result from this process are unintentional.

## 2018-04-29 NOTE — Patient Instructions (Addendum)
F/U 4 months for physical  No changes to your medication

## 2018-05-01 ENCOUNTER — Encounter: Payer: Self-pay | Admitting: Family Medicine

## 2018-05-01 NOTE — Assessment & Plan Note (Signed)
Controlled no change to meds continue norvasc and atenolol

## 2018-05-01 NOTE — Assessment & Plan Note (Signed)
Currently stable has not needed albuterol, continue Anora

## 2018-05-01 NOTE — Assessment & Plan Note (Signed)
Has cut down on tobacco use

## 2018-05-01 NOTE — Assessment & Plan Note (Signed)
No change to benzo, no sign of overuse

## 2018-06-24 ENCOUNTER — Telehealth: Payer: Self-pay | Admitting: Family Medicine

## 2018-06-24 ENCOUNTER — Ambulatory Visit (INDEPENDENT_AMBULATORY_CARE_PROVIDER_SITE_OTHER): Payer: Medicare Other | Admitting: Family Medicine

## 2018-06-24 ENCOUNTER — Other Ambulatory Visit: Payer: Self-pay

## 2018-06-24 VITALS — BP 140/82 | HR 66 | Temp 98.5°F | Resp 20 | Wt 176.6 lb

## 2018-06-24 DIAGNOSIS — K219 Gastro-esophageal reflux disease without esophagitis: Secondary | ICD-10-CM | POA: Diagnosis not present

## 2018-06-24 DIAGNOSIS — I10 Essential (primary) hypertension: Secondary | ICD-10-CM | POA: Diagnosis not present

## 2018-06-24 DIAGNOSIS — R079 Chest pain, unspecified: Secondary | ICD-10-CM | POA: Diagnosis not present

## 2018-06-24 MED ORDER — DEXLANSOPRAZOLE 60 MG PO CPDR: 60.0000 mg | DELAYED_RELEASE_CAPSULE | Freq: Every day | ORAL | 3 refills | Status: DC

## 2018-06-24 MED ORDER — PANTOPRAZOLE SODIUM 40 MG PO TBEC: 40.0000 mg | DELAYED_RELEASE_TABLET | Freq: Every day | ORAL | 3 refills | Status: DC

## 2018-06-24 NOTE — Telephone Encounter (Signed)
On 06/24/18 - Pt's wife called stating that pt is having chest pain (per pt has been going on and off for several months). I asked if his left arm was hurting - his reply was my arms always hurt because of his shoulder - pain any worse then normal - per pt no. Any sob - per pt has COPD so he does not know if he is sob - any worse then normal day - per pt no. Wanted to be seen today - we have no availability left for today and pt was instructed to go to the er for evaluation. Pt refused stating that he did not want to go to the hospital and made an apt for Friday with Dr. [name]. Pt did verbalize understanding that if it got worse to go to the ER.

## 2018-06-24 NOTE — Progress Notes (Signed)
   Subjective:    Patient ID: Bill Johnson, male    DOB: 1955-11-18, 63 y.o.   MRN: [id number]  Patient presents for Chest Pain (started 02/2018, gasy, bloated, acid reflux, mornings are worst, taken prilosec)  Pt here with chest pain intermittantly for past month or so, also has belching, feels bloated, has taken prilosec 40mg  once a day which helps a little. NO change in SOB, has COPD. Chest pain sometimes in center other times on left side. Can not tell about radiation because he has chronic bilat shoulder pain. He does admit that he has to stop mowing or doing yard work at times due to the discomfort and sit down No N/V no change in bowels  Review Of Systems:  GEN- denies fatigue, fever, weight loss,weakness, recent illness HEENT- denies eye drainage, change in vision, nasal discharge, CVS- +chest pain, denies  palpitations RESP- denies SOB, cough, wheeze ABD- denies N/V, change in stools, abd pain GU- denies dysuria, hematuria, dribbling, incontinence MSK- denies joint pain, muscle aches, injury Neuro- denies headache, dizziness, syncope, seizure activity       Objective:    BP 140/82   Pulse 66   Temp 98.5 F (36.9 C)   Resp 20   Wt 176 lb 9.6 oz (80.1 kg)   SpO2 95%   BMI 30.31 kg/m  GEN- NAD, alert and oriented x3 HEENT- PERRL, EOMI, non injected sclera, pink conjunctiva, MMM, oropharynx clear Neck- Supple, no bruit CVS- RRR, no murmur RESP-CTAB ABD-NABS,soft,NT,ND EXT- No edema Pulses- Radial 2+  EKG- NSR, HR  58 , no ST changes       Assessment & Plan:      Problem List Items Addressed This Visit      Unprioritized   GERD (gastroesophageal reflux disease)    He has symptoms consistent with his GERD being uncontrolled But also risk factors for CAD with HTN, COPD and family history He only wants to try stomach medicine first, declines seeing a cardiolgist Advised will try protonix once a day , EKG looks good He has been avoiding foods that also  bring on symptoms, but GERD does not explain his pain while exerting himself outside Discussed when to go to ER  He will give PPI trial no improvement he is willing to go to Cardiology       Relevant Medications   dexlansoprazole (DEXILANT) 60 MG capsule   pantoprazole (PROTONIX) 40 MG tablet   Hypertension - Primary   Relevant Orders   CBC with Differential/Platelet   Comprehensive metabolic panel    Other Visit Diagnoses    Chest pain, unspecified type       Relevant Orders   CBC with Differential/Platelet   Comprehensive metabolic panel   EKG 84-XLKG (Completed)      Note: This dictation was prepared with Dragon dictation along with smaller phrase technology. Any transcriptional errors that result from this process are unintentional.

## 2018-06-24 NOTE — Patient Instructions (Signed)
F/u 1 month 

## 2018-06-26 ENCOUNTER — Encounter: Payer: Self-pay | Admitting: Family Medicine

## 2018-06-26 NOTE — Assessment & Plan Note (Signed)
He has symptoms consistent with his GERD being uncontrolled But also risk factors for CAD with HTN, COPD and family history He only wants to try stomach medicine first, declines seeing a cardiolgist Advised will try protonix once a day , EKG looks good He has been avoiding foods that also bring on symptoms, but GERD does not explain his pain while exerting himself outside Discussed when to go to ER  He will give PPI trial no improvement he is willing to go to Cardiology

## 2018-07-25 ENCOUNTER — Encounter: Payer: Self-pay | Admitting: Family Medicine

## 2018-07-25 ENCOUNTER — Other Ambulatory Visit: Payer: Self-pay

## 2018-07-25 ENCOUNTER — Ambulatory Visit (INDEPENDENT_AMBULATORY_CARE_PROVIDER_SITE_OTHER): Payer: Medicare Other | Admitting: Family Medicine

## 2018-07-25 VITALS — BP 138/72 | HR 80 | Temp 98.9°F | Resp 16 | Ht 64.0 in | Wt 177.0 lb

## 2018-07-25 DIAGNOSIS — I10 Essential (primary) hypertension: Secondary | ICD-10-CM | POA: Diagnosis not present

## 2018-07-25 DIAGNOSIS — K219 Gastro-esophageal reflux disease without esophagitis: Secondary | ICD-10-CM

## 2018-07-25 MED ORDER — PANTOPRAZOLE SODIUM 40 MG PO TBEC: 40.0000 mg | DELAYED_RELEASE_TABLET | Freq: Every day | ORAL | 6 refills | Status: DC

## 2018-07-25 NOTE — Patient Instructions (Addendum)
Continue the protonix for your stomach  F/U Physical Oct/Nov

## 2018-07-25 NOTE — Assessment & Plan Note (Signed)
Well controlled Check fasting labs today No changes to meds Chest pain resolved

## 2018-07-25 NOTE — Progress Notes (Signed)
   Subjective:    Patient ID: Bill Johnson, male    DOB: January 06, 1956, 63 y.o.   MRN: [id number]  Patient presents for Follow-up (is fasting)   Pt here for intermin f/u on chest pain, GI symptoms, now resolved, taking protonix  after 1 week on the medications   Now as hearing aides ]  No concerns today  He is do for renal function and CBC check did not have labs at last visit    Review Of Systems:  GEN- denies fatigue, fever, weight loss,weakness, recent illness HEENT- denies eye drainage, change in vision, nasal discharge, CVS- denies chest pain, palpitations RESP- denies SOB, cough, wheeze ABD- denies N/V, change in stools, abd pain GU- denies dysuria, hematuria, dribbling, incontinence MSK- denies joint pain, muscle aches, injury Neuro- denies headache, dizziness, syncope, seizure activity       Objective:    BP 138/72   Pulse 80   Temp 98.9 F (37.2 C) (Oral)   Resp 16   Ht 5\' 4"  (1.626 m)   Wt 177 lb (80.3 kg)   SpO2 97%   BMI 30.38 kg/m  GEN- NAD, alert and oriented x3 HEENT- PERRL, EOMI, non injected sclera, pink conjunctiva, MMM, oropharynx clear CVS- RRR, no murmur RESP-CTAB ABD-NABS,soft,NT,ND EXT- No edema Pulses- Radial, 2+        Assessment & Plan:      Problem List Items Addressed This Visit      Unprioritized   GERD (gastroesophageal reflux disease)    PPI controlling symptoms Hold on referral at this time      Relevant Medications   pantoprazole (PROTONIX) 40 MG tablet   Hypertension - Primary    Well controlled Check fasting labs today No changes to meds Chest pain resolved      Relevant Orders   CBC with Differential/Platelet (Completed)   Comprehensive metabolic panel   Lipid panel      Note: This dictation was prepared with Dragon dictation along with smaller phrase technology. Any transcriptional errors that result from this process are unintentional.

## 2018-07-25 NOTE — Assessment & Plan Note (Signed)
PPI controlling symptoms Hold on referral at this time

## 2018-07-26 ENCOUNTER — Encounter: Payer: Self-pay | Admitting: Family Medicine

## 2018-07-26 DIAGNOSIS — E785 Hyperlipidemia, unspecified: Secondary | ICD-10-CM | POA: Insufficient documentation

## 2018-07-26 LAB — CBC WITH DIFFERENTIAL/PLATELET
Absolute Monocytes: 649 cells/uL (ref 200–950)
Basophils Absolute: 41 cells/uL (ref 0–200)
Basophils Relative: 0.6 %
Eosinophils Absolute: 242 cells/uL (ref 15–500)
Eosinophils Relative: 3.5 %
HCT: 40.5 % (ref 38.5–50.0)
Hemoglobin: 13.9 g/dL (ref 13.2–17.1)
Lymphs Abs: 2243 cells/uL (ref 850–3900)
MCH: 33.3 pg — ABNORMAL HIGH (ref 27.0–33.0)
MCHC: 34.3 g/dL (ref 32.0–36.0)
MCV: 96.9 fL (ref 80.0–100.0)
MPV: 10.3 fL (ref 7.5–12.5)
Monocytes Relative: 9.4 %
Neutro Abs: 3726 cells/uL (ref 1500–7800)
Neutrophils Relative %: 54 %
Platelets: 192 10*3/uL (ref 140–400)
RBC: 4.18 10*6/uL — ABNORMAL LOW (ref 4.20–5.80)
RDW: 13.2 % (ref 11.0–15.0)
Total Lymphocyte: 32.5 %
WBC: 6.9 10*3/uL (ref 3.8–10.8)

## 2018-07-26 LAB — COMPREHENSIVE METABOLIC PANEL
AG Ratio: 1.5 (calc) (ref 1.0–2.5)
ALT: 16 U/L (ref 9–46)
AST: 19 U/L (ref 10–35)
Albumin: 4.1 g/dL (ref 3.6–5.1)
Alkaline phosphatase (APISO): 55 U/L (ref 35–144)
BUN: 10 mg/dL (ref 7–25)
CO2: 22 mmol/L (ref 20–32)
Calcium: 9 mg/dL (ref 8.6–10.3)
Chloride: 108 mmol/L (ref 98–110)
Creat: 0.89 mg/dL (ref 0.70–1.25)
Globulin: 2.7 g/dL (calc) (ref 1.9–3.7)
Glucose, Bld: 93 mg/dL (ref 65–99)
Potassium: 4.6 mmol/L (ref 3.5–5.3)
Sodium: 140 mmol/L (ref 135–146)
Total Bilirubin: 0.6 mg/dL (ref 0.2–1.2)
Total Protein: 6.8 g/dL (ref 6.1–8.1)

## 2018-07-26 LAB — LIPID PANEL
Cholesterol: 265 mg/dL — ABNORMAL HIGH (ref <200)
HDL: 61 mg/dL (ref >=40)
LDL Cholesterol (Calc): 178 mg/dL (calc) — ABNORMAL HIGH
Non-HDL Cholesterol (Calc): 204 mg/dL (calc) — ABNORMAL HIGH (ref <130)
Total CHOL/HDL Ratio: 4.3 (calc) (ref <5.0)
Triglycerides: 129 mg/dL (ref <150)

## 2018-08-22 DIAGNOSIS — M12811 Other specific arthropathies, not elsewhere classified, right shoulder: Secondary | ICD-10-CM | POA: Diagnosis not present

## 2018-08-22 DIAGNOSIS — M5412 Radiculopathy, cervical region: Secondary | ICD-10-CM | POA: Diagnosis not present

## 2018-08-22 DIAGNOSIS — M12812 Other specific arthropathies, not elsewhere classified, left shoulder: Secondary | ICD-10-CM | POA: Diagnosis not present

## 2018-08-24 DIAGNOSIS — M4722 Other spondylosis with radiculopathy, cervical region: Secondary | ICD-10-CM | POA: Diagnosis not present

## 2018-08-24 DIAGNOSIS — M542 Cervicalgia: Secondary | ICD-10-CM | POA: Diagnosis not present

## 2018-08-25 DIAGNOSIS — M47892 Other spondylosis, cervical region: Secondary | ICD-10-CM | POA: Diagnosis not present

## 2018-08-25 DIAGNOSIS — Z6830 Body mass index (BMI) 30.0-30.9, adult: Secondary | ICD-10-CM | POA: Diagnosis not present

## 2018-08-25 DIAGNOSIS — I10 Essential (primary) hypertension: Secondary | ICD-10-CM | POA: Diagnosis not present

## 2018-08-25 DIAGNOSIS — M4722 Other spondylosis with radiculopathy, cervical region: Secondary | ICD-10-CM | POA: Diagnosis not present

## 2018-08-26 ENCOUNTER — Other Ambulatory Visit: Payer: Medicare Other

## 2018-08-26 ENCOUNTER — Other Ambulatory Visit: Payer: Self-pay

## 2018-08-26 DIAGNOSIS — Z0000 Encounter for general adult medical examination without abnormal findings: Secondary | ICD-10-CM | POA: Diagnosis not present

## 2018-08-26 DIAGNOSIS — E785 Hyperlipidemia, unspecified: Secondary | ICD-10-CM | POA: Diagnosis not present

## 2018-08-26 DIAGNOSIS — Z125 Encounter for screening for malignant neoplasm of prostate: Secondary | ICD-10-CM | POA: Diagnosis not present

## 2018-08-26 DIAGNOSIS — I10 Essential (primary) hypertension: Secondary | ICD-10-CM | POA: Diagnosis not present

## 2018-08-27 LAB — COMPREHENSIVE METABOLIC PANEL
AG Ratio: 1.6 (calc) (ref 1.0–2.5)
ALT: 14 U/L (ref 9–46)
AST: 14 U/L (ref 10–35)
Albumin: 4.2 g/dL (ref 3.6–5.1)
Alkaline phosphatase (APISO): 48 U/L (ref 35–144)
BUN: 11 mg/dL (ref 7–25)
CO2: 23 mmol/L (ref 20–32)
Calcium: 9.2 mg/dL (ref 8.6–10.3)
Chloride: 106 mmol/L (ref 98–110)
Creat: 0.92 mg/dL (ref 0.70–1.25)
Globulin: 2.7 g/dL (calc) (ref 1.9–3.7)
Glucose, Bld: 93 mg/dL (ref 65–99)
Potassium: 4.5 mmol/L (ref 3.5–5.3)
Sodium: 139 mmol/L (ref 135–146)
Total Bilirubin: 0.7 mg/dL (ref 0.2–1.2)
Total Protein: 6.9 g/dL (ref 6.1–8.1)

## 2018-08-27 LAB — CBC WITH DIFFERENTIAL/PLATELET
Absolute Monocytes: 711 cells/uL (ref 200–950)
Basophils Absolute: 72 cells/uL (ref 0–200)
Basophils Relative: 0.8 %
Eosinophils Absolute: 360 cells/uL (ref 15–500)
Eosinophils Relative: 4 %
HCT: 41.2 % (ref 38.5–50.0)
Hemoglobin: 14.2 g/dL (ref 13.2–17.1)
Lymphs Abs: 3204 cells/uL (ref 850–3900)
MCH: 33.2 pg — ABNORMAL HIGH (ref 27.0–33.0)
MCHC: 34.5 g/dL (ref 32.0–36.0)
MCV: 96.3 fL (ref 80.0–100.0)
MPV: 10.1 fL (ref 7.5–12.5)
Monocytes Relative: 7.9 %
Neutro Abs: 4653 cells/uL (ref 1500–7800)
Neutrophils Relative %: 51.7 %
Platelets: 248 10*3/uL (ref 140–400)
RBC: 4.28 10*6/uL (ref 4.20–5.80)
RDW: 13 % (ref 11.0–15.0)
Total Lymphocyte: 35.6 %
WBC: 9 10*3/uL (ref 3.8–10.8)

## 2018-08-27 LAB — LIPID PANEL
Cholesterol: 230 mg/dL — ABNORMAL HIGH (ref <200)
HDL: 62 mg/dL (ref >=40)
LDL Cholesterol (Calc): 143 mg/dL (calc) — ABNORMAL HIGH
Non-HDL Cholesterol (Calc): 168 mg/dL (calc) — ABNORMAL HIGH (ref <130)
Total CHOL/HDL Ratio: 3.7 (calc) (ref <5.0)
Triglycerides: 129 mg/dL (ref <150)

## 2018-08-27 LAB — PSA: PSA: 0.4 ng/mL (ref <=4.0)

## 2018-08-29 ENCOUNTER — Encounter: Payer: Self-pay | Admitting: Family Medicine

## 2018-08-29 ENCOUNTER — Other Ambulatory Visit: Payer: Self-pay

## 2018-08-29 ENCOUNTER — Ambulatory Visit (INDEPENDENT_AMBULATORY_CARE_PROVIDER_SITE_OTHER): Payer: Medicare Other | Admitting: Family Medicine

## 2018-08-29 VITALS — BP 136/74 | HR 78 | Temp 98.8°F | Resp 16 | Ht 64.0 in | Wt 174.0 lb

## 2018-08-29 DIAGNOSIS — I10 Essential (primary) hypertension: Secondary | ICD-10-CM

## 2018-08-29 DIAGNOSIS — Z0001 Encounter for general adult medical examination with abnormal findings: Secondary | ICD-10-CM

## 2018-08-29 DIAGNOSIS — F411 Generalized anxiety disorder: Secondary | ICD-10-CM

## 2018-08-29 DIAGNOSIS — E782 Mixed hyperlipidemia: Secondary | ICD-10-CM | POA: Diagnosis not present

## 2018-08-29 DIAGNOSIS — J439 Emphysema, unspecified: Secondary | ICD-10-CM | POA: Diagnosis not present

## 2018-08-29 DIAGNOSIS — H833X3 Noise effects on inner ear, bilateral: Secondary | ICD-10-CM

## 2018-08-29 DIAGNOSIS — Z23 Encounter for immunization: Secondary | ICD-10-CM | POA: Diagnosis not present

## 2018-08-29 DIAGNOSIS — Z0000 Encounter for general adult medical examination without abnormal findings: Secondary | ICD-10-CM

## 2018-08-29 MED ORDER — AMLODIPINE BESYLATE 5 MG PO TABS: 5.0000 mg | ORAL_TABLET | Freq: Every day | ORAL | 3 refills | Status: DC

## 2018-08-29 MED ORDER — MECLIZINE HCL 25 MG PO TABS: ORAL_TABLET | ORAL | 2 refills | Status: DC

## 2018-08-29 MED ORDER — ATORVASTATIN CALCIUM 20 MG PO TABS: ORAL_TABLET | ORAL | 1 refills | Status: DC

## 2018-08-29 MED ORDER — ALPRAZOLAM 0.5 MG PO TABS: 0.5000 mg | ORAL_TABLET | Freq: Every evening | ORAL | 3 refills | Status: DC | PRN

## 2018-08-29 MED ORDER — PANTOPRAZOLE SODIUM 40 MG PO TBEC: 40.0000 mg | DELAYED_RELEASE_TABLET | Freq: Every day | ORAL | 6 refills | Status: DC

## 2018-08-29 MED ORDER — UMECLIDINIUM-VILANTEROL 62.5-25 MCG/INH IN AEPB: 1.0000 | INHALATION_SPRAY | Freq: Every day | RESPIRATORY_TRACT | 11 refills | Status: DC

## 2018-08-29 MED ORDER — ATENOLOL 50 MG PO TABS: 50.0000 mg | ORAL_TABLET | Freq: Every day | ORAL | 3 refills | Status: DC

## 2018-08-29 NOTE — Progress Notes (Signed)
Subjective:   Patient presents for Medicare Annual/Subsequent preventive examination.    Monday went [facility] orthopedics in [place] , was having a lot of pain in both shoulders , givne    Wed had MRI done of neck- Thursday had appt with Dr. Ashok Pall   Has severed DDD in neck.pinched nerve , discussed neck surgery but decided to try injections in neck   Given Medrol dosepak which has helped, given Ultram 50mg   By orthopedics but his did not help     Review Past Medical/Family/Social: per EMR   Risk Factors  Current exercise habits: none Dietary issues discussed: Yes  Cardiac risk factors: HTN,   Depression Screen  (Note: if answer to either of the following is "Yes", a more complete depression screening is indicated)  Over the past two weeks, have you felt down, depressed or hopeless? No Over the past two weeks, have you felt little interest or pleasure in doing things? No Have you lost interest or pleasure in daily life? No Do you often feel hopeless? No Do you cry easily over simple problems? No   Activities of Daily Living  In your present state of health, do you have any difficulty performing the following activities?:  Driving? No  Managing money? No  Feeding yourself? No  Getting from bed to chair? No  Climbing a flight of stairs? Yes Preparing food and eating?: No  Bathing or showering? No  Getting dressed: No  Getting to the toilet? No  Using the toilet:No  Moving around from place to place: No  In the past year have you fallen or had a near fall?:Yes Are you sexually active? No  Do you have more than one partner? No   Hearing Difficulties: Yes - has hearing aides  Do you often ask people to speak up or repeat themselves? Yes  Do you experience ringing or noises in your ears? Yes Do you have difficulty understanding soft or whispered voices? Yes Do you feel that you have a problem with memory? Yes Do you often misplace items? Yes Do you feel safe at home?  Yes  Cognitive Testing  Alert? Yes Normal Appearance?Yes  Oriented to person? Yes Place? Yes  Time? Yes  Recall of three objects? Yes  Can perform simple calculations? Yes  Displays appropriate judgment?Yes  Can read the correct time from a watch face?Yes   List the Names of Other Physician/Practitioners you currently use:  [facility]- Dr. Cyndy Freeze   Screening Tests / Date Colonoscopy  Due declines                    Zostavax Declines  Tetanus/tdap UTF Pneumonia- Due HEP C- negative  ROS:  GEN- denies fatigue, fever, weight loss,weakness, recent illness HEENT- denies eye drainage, change in vision, nasal discharge, CVS- denies chest pain, palpitations RESP- denies SOB, cough, wheeze ABD- denies N/V, change in stools, abd pain GU- denies dysuria, hematuria, dribbling, incontinence MSK- + joint pain, muscle aches, injury Neuro- denies headache, dizziness, syncope, seizure activity  Physical: Vitals reviewed  GEN- NAD, alert and oriented x3 HEENT- PERRL, EOMI, non injected sclera, pink conjunctiva, MMM, oropharynx clear, TM clear  Neck- Supple, no thryomegaly, no bruit  CVS- RRR, no murmur RESP-CTAB ABD-NABS,soft,NT,ND EXT- No edema Pulses- Radial 2+   Assessment:    Annual wellness medicare exam   Plan:    During the course of the visit the patient was educated and counseled about appropriate screening and preventive services including:  Preventative medicine he agrees to get his pneumonia vaccine today.  He does have underlying COPD which is fairly controlled with his inhaler.  He declines colonoscopy.  PSA is normal.  Hyperlipidemia he has strong family history of heart disease we will start him on Lipitor 20 mg at bedtime goal to get LDL less than 100.  Recheck his labs in 4 months  Hypertension blood pressure has been controlled with his current regimen  GAD- continue xanax, also helps with sleep   Degenerative disc  disease of his cervical spine we will obtain records from [place] [facility]  Hearing loss he currently has hearing aids he is legally deaf  Audit c- has history of alcohol overuse, tries to control amount   No recent falls     Patient Instructions (the written plan) was given to the patient.  Medicare Attestation  I have personally reviewed:  The patient's medical and social history  Their use of alcohol, tobacco or illicit drugs  Their current medications and supplements  The patient's functional ability including ADLs,fall risks, home safety risks, cognitive, and hearing and visual impairment  Diet and physical activities  Evidence for depression or mood disorders  The patient's weight, height, BMI, and visual acuity have been recorded in the chart. I have made referrals, counseling, and provided education to the patient based on review of the above and I have provided the patient with a written personalized care plan for preventive services.

## 2018-08-29 NOTE — Patient Instructions (Signed)
Pneumonia vaccine given  I will get records from Dr. Cyndy Freeze Continue your current medications Start lipitor at bedtime for cholesterol F/U 4 months

## 2018-08-29 NOTE — Addendum Note (Signed)
Addended by: Sheral Flow on: 08/29/2018 03:50 PM   Modules accepted: Orders

## 2018-09-06 DIAGNOSIS — M47892 Other spondylosis, cervical region: Secondary | ICD-10-CM | POA: Insufficient documentation

## 2018-09-06 DIAGNOSIS — M47812 Spondylosis without myelopathy or radiculopathy, cervical region: Secondary | ICD-10-CM | POA: Diagnosis not present

## 2018-09-26 DIAGNOSIS — M47892 Other spondylosis, cervical region: Secondary | ICD-10-CM | POA: Diagnosis not present

## 2018-09-26 DIAGNOSIS — M47812 Spondylosis without myelopathy or radiculopathy, cervical region: Secondary | ICD-10-CM | POA: Diagnosis not present

## 2018-10-11 DIAGNOSIS — Z6829 Body mass index (BMI) 29.0-29.9, adult: Secondary | ICD-10-CM | POA: Diagnosis not present

## 2018-10-11 DIAGNOSIS — M47892 Other spondylosis, cervical region: Secondary | ICD-10-CM | POA: Diagnosis not present

## 2018-10-11 DIAGNOSIS — I10 Essential (primary) hypertension: Secondary | ICD-10-CM | POA: Diagnosis not present

## 2018-10-31 ENCOUNTER — Other Ambulatory Visit: Payer: Self-pay

## 2018-10-31 ENCOUNTER — Ambulatory Visit (INDEPENDENT_AMBULATORY_CARE_PROVIDER_SITE_OTHER): Payer: Medicare Other | Admitting: *Deleted

## 2018-10-31 DIAGNOSIS — Z23 Encounter for immunization: Secondary | ICD-10-CM | POA: Diagnosis not present

## 2018-10-31 NOTE — Progress Notes (Signed)
Patient seen in office for Influenza Vaccination.   Tolerated IM administration well.   Immunization history updated.  

## 2018-11-01 ENCOUNTER — Ambulatory Visit: Payer: Medicare Other

## 2018-12-30 ENCOUNTER — Ambulatory Visit: Payer: Medicare Other | Admitting: Family Medicine

## 2019-01-02 ENCOUNTER — Other Ambulatory Visit: Payer: Self-pay

## 2019-01-02 ENCOUNTER — Encounter: Payer: Self-pay | Admitting: Family Medicine

## 2019-01-02 ENCOUNTER — Ambulatory Visit (INDEPENDENT_AMBULATORY_CARE_PROVIDER_SITE_OTHER): Payer: Medicare Other | Admitting: Family Medicine

## 2019-01-02 VITALS — BP 130/62 | HR 60 | Temp 98.2°F | Resp 16 | Ht 64.0 in | Wt 177.0 lb

## 2019-01-02 DIAGNOSIS — F411 Generalized anxiety disorder: Secondary | ICD-10-CM | POA: Diagnosis not present

## 2019-01-02 DIAGNOSIS — J439 Emphysema, unspecified: Secondary | ICD-10-CM

## 2019-01-02 DIAGNOSIS — I10 Essential (primary) hypertension: Secondary | ICD-10-CM | POA: Diagnosis not present

## 2019-01-02 DIAGNOSIS — E782 Mixed hyperlipidemia: Secondary | ICD-10-CM | POA: Diagnosis not present

## 2019-01-02 MED ORDER — ALPRAZOLAM 0.5 MG PO TABS: 0.5000 mg | ORAL_TABLET | Freq: Every evening | ORAL | 3 refills | Status: DC | PRN

## 2019-01-02 NOTE — Assessment & Plan Note (Signed)
He is on statin drug.  He would like for [place] to call his brother with the results and we can also mail him a copy because of his hearing problems he has difficulty on the phone.

## 2019-01-02 NOTE — Assessment & Plan Note (Signed)
Well on current medications.  He has cut his cigarettes down to 5-6 a day.  He has had flu shot.  No changes

## 2019-01-02 NOTE — Assessment & Plan Note (Signed)
Continue alprazolam 

## 2019-01-02 NOTE — Assessment & Plan Note (Signed)
Blood pressure is controlled no change in medication. 

## 2019-01-02 NOTE — Progress Notes (Signed)
   Subjective:    Patient ID: Bill Johnson, male    DOB: [birth year], 63 y.o.   MRN: [id number]  Patient presents for Follow-up (is not fasting) Patient here to follow-up chronic medical problems.  He does not have any particular concerns today. Hyperlipidemia - he was started on lipitor at 20mg  at last visit   Hypertension blood pressure has been controlled with his current regimen  GAD- continue xanax, also helps with sleep , needs refill on xanax  Wearing hearing aides more now- has appt in March     COPD- using anora, has albuterol on hand he has had flu shot.  No recent exacerbations.  Review Of Systems:  GEN- denies fatigue, fever, weight loss,weakness, recent illness HEENT- denies eye drainage, change in vision, nasal discharge, CVS- denies chest pain, palpitations RESP- denies SOB, cough, wheeze ABD- denies N/V, change in stools, abd pain GU- denies dysuria, hematuria, dribbling, incontinence MSK- denies joint pain, muscle aches, injury Neuro- denies headache, dizziness, syncope, seizure activity       Objective:    BP 130/62   Pulse 60   Temp 98.2 F (36.8 C) (Temporal)   Resp 16   Ht 5\' 4"  (1.626 m)   Wt 177 lb (80.3 kg)   SpO2 98%   BMI 30.38 kg/m  GEN- NAD, alert and oriented x3 HEENT- PERRL, EOMI, non injected sclera, pink conjunctiva, CVS- RRR, no murmur RESP-CTAB Psych normal affect and mood EXT- No edema Pulses- Radial, 2+        Assessment & Plan:      Problem List Items Addressed This Visit      Unprioritized   COPD (chronic obstructive pulmonary disease) ([place])    Well on current medications.  He has cut his cigarettes down to 5-6 a day.  He has had flu shot.  No changes      GAD (generalized anxiety disorder)    Continue alprazolam      Relevant Medications   ALPRAZolam (XANAX) 0.5 MG tablet   Hyperlipemia    He is on statin drug.  He would like for [place] to call his brother with the results and we can also mail him a copy  because of his hearing problems he has difficulty on the phone.      Relevant Orders   Lipid Panel   Hypertension - Primary    Blood pressure is controlled no change in medication.      Relevant Orders   Comprehensive metabolic panel      Note: This dictation was prepared with Dragon dictation along with smaller phrase technology. Any transcriptional errors that result from this process are unintentional.

## 2019-01-02 NOTE — Patient Instructions (Signed)
F/U 4 months  

## 2019-01-03 ENCOUNTER — Encounter: Payer: Self-pay | Admitting: *Deleted

## 2019-01-03 LAB — COMPREHENSIVE METABOLIC PANEL
AG Ratio: 1.4 (calc) (ref 1.0–2.5)
ALT: 21 U/L (ref 9–46)
AST: 26 U/L (ref 10–35)
Albumin: 4.3 g/dL (ref 3.6–5.1)
Alkaline phosphatase (APISO): 67 U/L (ref 35–144)
BUN: 8 mg/dL (ref 7–25)
CO2: 24 mmol/L (ref 20–32)
Calcium: 9.4 mg/dL (ref 8.6–10.3)
Chloride: 108 mmol/L (ref 98–110)
Creat: 0.9 mg/dL (ref 0.70–1.25)
Globulin: 3 g/dL (calc) (ref 1.9–3.7)
Glucose, Bld: 89 mg/dL (ref 65–99)
Potassium: 4.4 mmol/L (ref 3.5–5.3)
Sodium: 142 mmol/L (ref 135–146)
Total Bilirubin: 0.7 mg/dL (ref 0.2–1.2)
Total Protein: 7.3 g/dL (ref 6.1–8.1)

## 2019-01-03 LAB — LIPID PANEL
Cholesterol: 200 mg/dL — ABNORMAL HIGH (ref <200)
HDL: 68 mg/dL (ref >=40)
LDL Cholesterol (Calc): 108 mg/dL (calc) — ABNORMAL HIGH
Non-HDL Cholesterol (Calc): 132 mg/dL (calc) — ABNORMAL HIGH (ref <130)
Total CHOL/HDL Ratio: 2.9 (calc) (ref <5.0)
Triglycerides: 129 mg/dL (ref <150)

## 2019-02-14 ENCOUNTER — Telehealth: Payer: Self-pay | Admitting: Family Medicine

## 2019-02-14 MED ORDER — MECLIZINE HCL 25 MG PO TABS: ORAL_TABLET | ORAL | 2 refills | Status: DC

## 2019-02-14 NOTE — Telephone Encounter (Signed)
Prescription sent to pharmacy.

## 2019-02-14 NOTE — Telephone Encounter (Signed)
Patient needs refill on meclizine  [facility] [place]

## 2019-03-18 ENCOUNTER — Other Ambulatory Visit: Payer: Self-pay

## 2019-03-18 ENCOUNTER — Encounter (HOSPITAL_COMMUNITY): Payer: Self-pay

## 2019-03-18 ENCOUNTER — Observation Stay (HOSPITAL_COMMUNITY)
Admission: EM | Admit: 2019-03-18 | Discharge: 2019-03-19 | Disposition: A | Discharge status: Home / Self Care | Payer: Medicare Other | Attending: Family Medicine | Admitting: Family Medicine

## 2019-03-18 ENCOUNTER — Emergency Department (HOSPITAL_COMMUNITY): Payer: Medicare Other

## 2019-03-18 DIAGNOSIS — E785 Hyperlipidemia, unspecified: Secondary | ICD-10-CM | POA: Insufficient documentation

## 2019-03-18 DIAGNOSIS — R0602 Shortness of breath: Secondary | ICD-10-CM

## 2019-03-18 DIAGNOSIS — Z7982 Long term (current) use of aspirin: Secondary | ICD-10-CM | POA: Diagnosis not present

## 2019-03-18 DIAGNOSIS — R079 Chest pain, unspecified: Secondary | ICD-10-CM | POA: Diagnosis not present

## 2019-03-18 DIAGNOSIS — I10 Essential (primary) hypertension: Secondary | ICD-10-CM | POA: Diagnosis not present

## 2019-03-18 DIAGNOSIS — R0789 Other chest pain: Secondary | ICD-10-CM | POA: Diagnosis not present

## 2019-03-18 DIAGNOSIS — Z20822 Contact with and (suspected) exposure to covid-19: Secondary | ICD-10-CM | POA: Insufficient documentation

## 2019-03-18 DIAGNOSIS — Z79899 Other long term (current) drug therapy: Secondary | ICD-10-CM | POA: Insufficient documentation

## 2019-03-18 DIAGNOSIS — I209 Angina pectoris, unspecified: Secondary | ICD-10-CM | POA: Diagnosis present

## 2019-03-18 DIAGNOSIS — J449 Chronic obstructive pulmonary disease, unspecified: Secondary | ICD-10-CM | POA: Diagnosis not present

## 2019-03-18 DIAGNOSIS — F17210 Nicotine dependence, cigarettes, uncomplicated: Secondary | ICD-10-CM | POA: Insufficient documentation

## 2019-03-18 DIAGNOSIS — Z720 Tobacco use: Secondary | ICD-10-CM | POA: Diagnosis present

## 2019-03-18 DIAGNOSIS — K219 Gastro-esophageal reflux disease without esophagitis: Secondary | ICD-10-CM | POA: Insufficient documentation

## 2019-03-18 LAB — CBC
HCT: 40.6 % (ref 39.0–52.0)
Hemoglobin: 13.5 g/dL (ref 13.0–17.0)
MCH: 33.1 pg (ref 26.0–34.0)
MCHC: 33.3 g/dL (ref 30.0–36.0)
MCV: 99.5 fL (ref 80.0–100.0)
Platelets: 207 10*3/uL (ref 150–400)
RBC: 4.08 MIL/uL — ABNORMAL LOW (ref 4.22–5.81)
RDW: 13.2 % (ref 11.5–15.5)
WBC: 8.1 10*3/uL (ref 4.0–10.5)
nRBC: 0 % (ref 0.0–0.2)

## 2019-03-18 LAB — BASIC METABOLIC PANEL
Anion gap: 9 (ref 5–15)
BUN: 8 mg/dL (ref 8–23)
CO2: 25 mmol/L (ref 22–32)
Calcium: 8.8 mg/dL — ABNORMAL LOW (ref 8.9–10.3)
Chloride: 104 mmol/L (ref 98–111)
Creatinine, Ser: 0.78 mg/dL (ref 0.61–1.24)
GFR calc Af Amer: >60 mL/min (ref >60)
GFR calc non Af Amer: >60 mL/min (ref >60)
Glucose, Bld: 102 mg/dL — ABNORMAL HIGH (ref 70–99)
Potassium: 3.5 mmol/L (ref 3.5–5.1)
Sodium: 138 mmol/L (ref 135–145)

## 2019-03-18 LAB — LIPASE, BLOOD: Lipase: 20 U/L (ref 11–51)

## 2019-03-18 LAB — HEPATIC FUNCTION PANEL
ALT: 12 U/L (ref 0–44)
AST: 20 U/L (ref 15–41)
Albumin: 3.1 g/dL — ABNORMAL LOW (ref 3.5–5.0)
Alkaline Phosphatase: 45 U/L (ref 38–126)
Bilirubin, Direct: <0.1 mg/dL (ref 0.0–0.2)
Total Bilirubin: 0.4 mg/dL (ref 0.3–1.2)
Total Protein: 6.4 g/dL — ABNORMAL LOW (ref 6.5–8.1)

## 2019-03-18 LAB — TROPONIN I (HIGH SENSITIVITY)
Troponin I (High Sensitivity): 4 ng/L (ref <18)
Troponin I (High Sensitivity): 4 ng/L (ref <18)

## 2019-03-18 MED ORDER — LIDOCAINE VISCOUS HCL 2 % MT SOLN
15.0000 mL | Freq: Once | OROMUCOSAL | Status: AC
  Administered 2019-03-18: 22:00:00 15 mL via ORAL
  Filled 2019-03-18: qty 15

## 2019-03-18 MED ORDER — ALUM & MAG HYDROXIDE-SIMETH 200-200-20 MG/5ML PO SUSP
30.0000 mL | Freq: Once | ORAL | Status: AC
  Administered 2019-03-18: 30 mL via ORAL
  Filled 2019-03-18: qty 30

## 2019-03-18 MED ORDER — DICYCLOMINE HCL 10 MG PO CAPS
20.0000 mg | ORAL_CAPSULE | Freq: Once | ORAL | Status: AC
  Administered 2019-03-18: 22:00:00 20 mg via ORAL
  Filled 2019-03-18: qty 2

## 2019-03-18 NOTE — ED Provider Notes (Signed)
[facility] EMERGENCY DEPARTMENT Provider Note   CSN: [id number] Arrival date & time: 03/18/19  1926     History Chief Complaint  Patient presents with  . Chest Pain    Bill Johnson is a 64 y.o. male.  HPI Patient is a 64 year old male with a history of severe acid reflux presented today with several weeks of intermittent chest pain that has been worse over the past 2 days.  He states that it is a pressure in the middle of his chest and seems to have some exertional components.  He states it is worse when he walks and states he becomes short of breath.  He states he has a history of COPD however he states does not feel like a COPD exacerbation states he is not wheezing at all.  He denies any nausea or diaphoresis with these episodes.  He states that the chest pain does seem to radiate occasionally across his chest to the left rib cage.   He denies any pleuritic chest pain, history of blood clots, recent surgeries or immobilization, recent long travel.  Patient states that he is a smoker, has a history of hypertension and hyperlipidemia, he states his father had a heart attack before the age of 76.  He denies any personal coronary artery disease that is known.    Past Medical History:  Diagnosis Date  . Allergy    seasonal  . Anxiety   . Depression   . GERD (gastroesophageal reflux disease)   . Hypertension   . OA (osteoarthritis) of knee   . Rotator cuff tear arthropathy of both shoulders    On DISABILITY, non operative case  . Shoulder pain   . Vertigo     Patient Active Problem List   Diagnosis Date Noted  . Chest pain 03/19/2019  . Hyperlipemia 07/26/2018  . GERD (gastroesophageal reflux disease) 06/24/2018  . OA (osteoarthritis) of shoulder 06/28/2017  . B12 deficiency 07/01/2016  . Hypertension 07/01/2016  . COPD (chronic obstructive pulmonary disease) ([place]) 07/01/2016  . Tobacco use 07/01/2016  . GAD (generalized anxiety disorder) 07/01/2016  . Chronic pain  07/01/2016  . Vertigo 11/28/2014  . Hearing loss 11/28/2014  . Tremor 11/28/2014    Past Surgical History:  Procedure Laterality Date  . CARDIAC CATHETERIZATION  06/2005   no significant CAD  . HERNIA REPAIR  1983  . ROTATOR CUFF REPAIR Bilateral   . TONSILLECTOMY  1977       Family History  Problem Relation Age of Onset  . Heart disease Mother   . Diabetes Mother   . Hypertension Mother   . Heart disease Father   . Hypertension Father   . Stroke Other   . Cancer Other   . Cancer Brother     Social History   Tobacco Use  . Smoking status: Current Every Day Smoker    Packs/day: 1.00    Years: 43.00    Pack years: 43.00    Types: Cigarettes  . Smokeless tobacco: Never Used  Substance Use Topics  . Alcohol use: Yes    Alcohol/week: 48.0 standard drinks    Types: 48 Cans of beer per week    Comment: Drinks 4 beers/day (11/28/14)  . Drug use: No    Home Medications Prior to Admission medications   Medication Sig Start Date End Date Taking? Authorizing Provider  albuterol (PROVENTIL HFA;VENTOLIN HFA) 108 (90 Base) MCG/ACT inhaler Inhale 2 puffs into the lungs every 6 (six) hours as needed for wheezing  or shortness of breath. 10/13/17  Yes Orlena Sheldon, PA-C  ALPRAZolam Duanne Moron) 0.5 MG tablet Take 1 tablet (0.5 mg total) by mouth at bedtime as needed for anxiety. 01/02/19  Yes [place], Modena Nunnery, MD  amLODipine (NORVASC) 5 MG tablet Take 1 tablet (5 mg total) by mouth daily. 08/29/18 08/29/19 Yes [place], Modena Nunnery, MD  aspirin 81 MG tablet Take 81 mg by mouth daily.   Yes [provider]  atenolol (TENORMIN) 50 MG tablet Take 1 tablet (50 mg total) by mouth daily. 08/29/18  Yes [place], Modena Nunnery, MD  atorvastatin (LIPITOR) 20 MG tablet Take 1 tablet at bedtime for cholesterol 08/29/18  Yes [place], Modena Nunnery, MD  Cyanocobalamin (B-12 PO) Take 2 tablets by mouth daily.   Yes [provider]  meclizine (ANTIVERT) 25 MG tablet TAKE 1 TABLET BY MOUTH THREE TIMES DAILY  AS NEEDED FOR DIZZINESS 02/14/19  Yes [place], Modena Nunnery, MD  Menthol-Camphor (TIGER BALM ARTHRITIS RUB) 11-11 % CREA Apply topically.   Yes [provider]  naproxen sodium (ANAPROX) 220 MG tablet Take 220 mg by mouth 2 (two) times daily with a meal.   Yes [provider]  pantoprazole (PROTONIX) 40 MG tablet Take 1 tablet (40 mg total) by mouth daily. 08/29/18  Yes [place], Modena Nunnery, MD  umeclidinium-vilanterol [facility] ELLIPTA) 62.5-25 MCG/INH AEPB Inhale 1 puff into the lungs daily. 08/29/18  Yes [place], Modena Nunnery, MD    Allergies    Patient has no known allergies.  Review of Systems   Review of Systems  Constitutional: Negative for chills and fever.  HENT: Negative for congestion.   Eyes: Negative for pain.  Respiratory: Positive for chest tightness and shortness of breath. Negative for cough.   Cardiovascular: Positive for chest pain. Negative for leg swelling.  Gastrointestinal: Negative for abdominal pain and vomiting.       Dyspepsia, belching, flatulence  Genitourinary: Negative for dysuria.  Musculoskeletal: Negative for myalgias.  Skin: Negative for rash.  Neurological: Negative for dizziness and headaches.    Physical Exam Updated Vital Signs BP 134/82   Pulse 71   Temp 98 F (36.7 C)   Resp 18   Wt 80.3 kg   SpO2 97%   BMI 30.39 kg/m   Physical Exam Vitals and nursing note reviewed.  Constitutional:      General: He is not in acute distress.    Comments: 64 year old male in no acute distress.  Sitting comfortably in bed no increased work of breathing, speaking full sentences without difficulty.  Extremely hard of hearing  HENT:     Head: Normocephalic and atraumatic.     Nose: Nose normal.  Eyes:     General: No scleral icterus. Cardiovascular:     Rate and Rhythm: Normal rate and regular rhythm.     Pulses: Normal pulses.     Heart sounds: Normal heart sounds.     Comments: Bilateral radial and DP/PT pulses 3+ and symmetric. Pulmonary:      Effort: Pulmonary effort is normal. No respiratory distress.     Breath sounds: Normal breath sounds. No wheezing.  Abdominal:     Palpations: Abdomen is soft.     Tenderness: There is no abdominal tenderness.  Musculoskeletal:     Cervical back: Normal range of motion.     Right lower leg: No edema.     Left lower leg: No edema.     Comments: No calf tenderness or lower extremity swelling.  Skin:  General: Skin is warm and dry.     Capillary Refill: Capillary refill takes less than 2 seconds.  Neurological:     Mental Status: He is alert. Mental status is at baseline.  Psychiatric:        Mood and Affect: Mood normal.        Behavior: Behavior normal.     ED Results / Procedures / Treatments   Labs (all labs ordered are listed, but only abnormal results are displayed) Labs Reviewed  BASIC METABOLIC PANEL - Abnormal; Notable for the following components:      Result Value   Glucose, Bld 102 (*)    Calcium 8.8 (*)    All other components within normal limits  CBC - Abnormal; Notable for the following components:   RBC 4.08 (*)    All other components within normal limits  HEPATIC FUNCTION PANEL - Abnormal; Notable for the following components:   Total Protein 6.4 (*)    Albumin 3.1 (*)    All other components within normal limits  SARS CORONAVIRUS 2 (TAT 6-24 HRS)  LIPASE, BLOOD  TROPONIN I (HIGH SENSITIVITY)  TROPONIN I (HIGH SENSITIVITY)    EKG EKG Interpretation  Date/Time:  Saturday March 18 2019 19:53:15 EST Ventricular Rate:  76 PR Interval:  152 QRS Duration: 96 QT Interval:  408 QTC Calculation: 459 R Axis:   -55 Text Interpretation: Normal sinus rhythm Left axis deviation Incomplete right bundle branch block Inferior infarct , age undetermined Abnormal ECG Confirmed by Nat Christen [phone]) on 03/18/2019 10:02:20 PM Also confirmed by Nat Christen [phone])  on 03/18/2019 10:38:53 PM   Radiology DG Chest 2 View  Result Date: 03/18/2019 CLINICAL DATA:   Chest pain EXAM: CHEST - 2 VIEW COMPARISON:  Chest radiograph dated 05/19/2013 FINDINGS: The heart size and mediastinal contours are within normal limits. There is minimal left basilar atelectasis. The right lung is clear. There is no pleural effusion or pneumothorax. The visualized skeletal structures are unremarkable. IMPRESSION: No active cardiopulmonary disease. Electronically Signed   By: Zerita Boers M.D.   On: 03/18/2019 20:13    Procedures Procedures (including critical care time)  Medications Ordered in ED Medications  enoxaparin (LOVENOX) injection 40 mg (has no administration in time range)  famotidine (PEPCID) IVPB 20 mg premix (has no administration in time range)  alum & mag hydroxide-simeth (MAALOX/MYLANTA) 200-200-20 MG/5ML suspension 30 mL (30 mLs Oral Given 03/18/19 2212)    And  lidocaine (XYLOCAINE) 2 % viscous mouth solution 15 mL (15 mLs Oral Given 03/18/19 2213)  dicyclomine (BENTYL) capsule 20 mg (20 mg Oral Given 03/18/19 2212)  morphine 4 MG/ML injection 4 mg (4 mg Intravenous Given 03/19/19 0033)    ED Course  I have reviewed the triage vital signs and the nursing notes.  Pertinent labs & imaging results that were available during my care of the patient were reviewed by me and considered in my medical decision making (see chart for details).    MDM Rules/Calculators/A&P                      Patient is 64 year old male with a history of hyperlipidemia, smoking, HTN, first-degree relative with AMI prior to age 68 who is presented today with chest pain shortness of breath that is exertional.  There are atypical qualities of patient's symptoms such as belching, flatulence, reflux symptoms.  However he does appear to have reproducibly exertional symptoms.  He has not been seen by primary care doctor  recently.  Patient states he had catheterization done many years ago but is not had one done last 5 years.  He has not seen cardiology since his catheterization was  done.  Initial EKG with some inferior lead TWI however repeat EKG shows no T wave inversions.  Troponins x2 within normal limits.  Because of patient's epigastric/sternal pain I added hepatic function panel and lipase to triage orders.  LFT, lipase, BMP and CBC were unremarkable.  Chest x-ray is independently reviewed by myself is unremarkable.  Patient symptoms seem to somewhat improve after GI cocktail however quickly worsened again.  He was given 1 dose of morphine for pain.  I discussed all results with patient he is understanding of need for admission for chest pain rule out.  He is agreeable to plan.  1:01 AM discussed case with Dr. Olevia Bowens.  Patient will be admitted for chest pain rule out.  Appreciate Dr. Olevia Bowens expert consultation.  Patient admitted for further evaluation and care.   Final Clinical Impression(s) / ED Diagnoses Final diagnoses:  Chest pain, unspecified type  SOB (shortness of breath)    Rx / DC Orders ED Discharge Orders    None       Tedd Sias, [place] 03/19/19 0102    Nat Christen, MD 03/19/19 2003

## 2019-03-18 NOTE — ED Notes (Signed)
Pt ambulatory to tx room. To was 96% on room air hr 97 bpm. Pt only complains he is a little exerted. Said that his chest pain has gotten some better.  Pt hooked to tele and given call button. Patient is hard of hearing and wears hearing aids

## 2019-03-18 NOTE — ED Triage Notes (Signed)
Pt c/o CP with mid chest pressure and occasional left chest pain for 2 days. Pt tried to treated it as acid reflux but it has not got better. Does say that he becomes sob with exertion but does have COPD

## 2019-03-19 ENCOUNTER — Observation Stay (HOSPITAL_BASED_OUTPATIENT_CLINIC_OR_DEPARTMENT_OTHER): Payer: Medicare Other

## 2019-03-19 ENCOUNTER — Other Ambulatory Visit: Payer: Self-pay | Admitting: Family Medicine

## 2019-03-19 ENCOUNTER — Encounter (HOSPITAL_COMMUNITY): Payer: Self-pay | Admitting: Internal Medicine

## 2019-03-19 DIAGNOSIS — I10 Essential (primary) hypertension: Secondary | ICD-10-CM

## 2019-03-19 DIAGNOSIS — J439 Emphysema, unspecified: Secondary | ICD-10-CM

## 2019-03-19 DIAGNOSIS — K219 Gastro-esophageal reflux disease without esophagitis: Secondary | ICD-10-CM

## 2019-03-19 DIAGNOSIS — I351 Nonrheumatic aortic (valve) insufficiency: Secondary | ICD-10-CM

## 2019-03-19 DIAGNOSIS — R079 Chest pain, unspecified: Secondary | ICD-10-CM

## 2019-03-19 DIAGNOSIS — I209 Angina pectoris, unspecified: Secondary | ICD-10-CM | POA: Diagnosis present

## 2019-03-19 DIAGNOSIS — Z720 Tobacco use: Secondary | ICD-10-CM

## 2019-03-19 LAB — SARS CORONAVIRUS 2 (TAT 6-24 HRS): SARS Coronavirus 2: NEGATIVE

## 2019-03-19 LAB — ECHOCARDIOGRAM COMPLETE
Height: 64 in
Weight: 2828.94 oz

## 2019-03-19 LAB — LIPID PANEL
Cholesterol: 249 mg/dL — ABNORMAL HIGH (ref 0–200)
HDL: 57 mg/dL (ref >40)
LDL Cholesterol: 153 mg/dL — ABNORMAL HIGH (ref 0–99)
Total CHOL/HDL Ratio: 4.4 RATIO
Triglycerides: 196 mg/dL — ABNORMAL HIGH (ref <150)
VLDL: 39 mg/dL (ref 0–40)

## 2019-03-19 LAB — TROPONIN I (HIGH SENSITIVITY): Troponin I (High Sensitivity): 6 ng/L (ref <18)

## 2019-03-19 MED ORDER — MORPHINE SULFATE (PF) 4 MG/ML IV SOLN
4.0000 mg | Freq: Once | INTRAVENOUS | Status: AC
  Administered 2019-03-19: 01:00:00 4 mg via INTRAVENOUS
  Filled 2019-03-19: qty 1

## 2019-03-19 MED ORDER — ONDANSETRON HCL 4 MG/2ML IJ SOLN: 4.0000 mg | Freq: Four times a day (QID) | INTRAMUSCULAR | Status: DC | PRN

## 2019-03-19 MED ORDER — MORPHINE SULFATE (PF) 4 MG/ML IV SOLN: 4.0000 mg | INTRAVENOUS | Status: DC | PRN

## 2019-03-19 MED ORDER — PANTOPRAZOLE SODIUM 40 MG PO TBEC: 40.0000 mg | DELAYED_RELEASE_TABLET | Freq: Two times a day (BID) | ORAL | 6 refills | Status: DC

## 2019-03-19 MED ORDER — ATENOLOL 25 MG PO TABS
50.0000 mg | ORAL_TABLET | Freq: Every day | ORAL | Status: DC
  Administered 2019-03-19: 50 mg via ORAL
  Filled 2019-03-19: qty 2

## 2019-03-19 MED ORDER — ENOXAPARIN SODIUM 40 MG/0.4ML ~~LOC~~ SOLN: 40.0000 mg | Freq: Every day | SUBCUTANEOUS | Status: DC

## 2019-03-19 MED ORDER — ACETAMINOPHEN 325 MG PO TABS: 650.0000 mg | ORAL_TABLET | ORAL | Status: DC | PRN

## 2019-03-19 MED ORDER — AMLODIPINE BESYLATE 5 MG PO TABS
5.0000 mg | ORAL_TABLET | Freq: Every day | ORAL | Status: DC
  Administered 2019-03-19: 5 mg via ORAL
  Filled 2019-03-19: qty 1

## 2019-03-19 MED ORDER — ATORVASTATIN CALCIUM 20 MG PO TABS: 20.0000 mg | ORAL_TABLET | Freq: Every day | ORAL | Status: DC

## 2019-03-19 MED ORDER — ALPRAZOLAM 0.5 MG PO TABS: 0.5000 mg | ORAL_TABLET | Freq: Every evening | ORAL | Status: DC | PRN

## 2019-03-19 MED ORDER — UMECLIDINIUM-VILANTEROL 62.5-25 MCG/INH IN AEPB
INHALATION_SPRAY | RESPIRATORY_TRACT | Status: AC
  Administered 2019-03-19: 1 via RESPIRATORY_TRACT
  Filled 2019-03-19: qty 14

## 2019-03-19 MED ORDER — UMECLIDINIUM-VILANTEROL 62.5-25 MCG/INH IN AEPB: 1.0000 | INHALATION_SPRAY | Freq: Every day | RESPIRATORY_TRACT | Status: DC

## 2019-03-19 MED ORDER — ASPIRIN EC 81 MG PO TBEC
81.0000 mg | DELAYED_RELEASE_TABLET | Freq: Every day | ORAL | Status: DC
  Administered 2019-03-19: 10:00:00 81 mg via ORAL
  Filled 2019-03-19: qty 1

## 2019-03-19 MED ORDER — PANTOPRAZOLE SODIUM 40 MG PO TBEC
40.0000 mg | DELAYED_RELEASE_TABLET | Freq: Every day | ORAL | Status: DC
  Administered 2019-03-19: 10:00:00 40 mg via ORAL
  Filled 2019-03-19: qty 1

## 2019-03-19 MED ORDER — FAMOTIDINE IN NACL 20-0.9 MG/50ML-% IV SOLN
20.0000 mg | Freq: Once | INTRAVENOUS | Status: AC
  Administered 2019-03-19: 02:00:00 20 mg via INTRAVENOUS
  Filled 2019-03-19: qty 50

## 2019-03-19 MED ORDER — ATORVASTATIN CALCIUM 40 MG PO TABS: 40.0000 mg | ORAL_TABLET | Freq: Every day | ORAL | 0 refills | Status: DC

## 2019-03-19 NOTE — H&P (Signed)
History and Physical    Bill Johnson [id number] DOB: 07/28/1955 DOA: 03/18/2019  PCP: Alycia Rossetti, MD   Patient coming from: Home.  I have personally briefly reviewed patient's old medical records in [place]  Chief Complaint: Chest pain.  HPI: Bill Johnson is a 64 y.o. male with medical history significant of allergies, anxiety, depression, GERD, hypertension, osteoarthritis of the knee, rotator cuff disease arthropathies of both shoulders, chronic shoulder pain, history of vertigo who is coming to the emergency department due to substernal chest pain since this morning associated with dyspnea on exertion and flatulence.  He denies pain radiation, palpitations, diaphoresis, PND, orthopnea or pitting edema of the lower extremities.  Nausea, emesis, diarrhea, constipation, melena or hematochezia.  No dysuria, frequency or hematuria.  He mentions that he tried to treated at home, but the symptoms persisted.  He has been taking Aleve for arthritic pain.  This is the first time he has had symptoms since he was put on Protonix by his doctor last year.  ED Course: Initial vital signs were temperature 98 F, pulse 86, respirations 19, blood pressure 170/89 mmHg and O2 sat 98% on room air.  The patient was given Maalox 30 mL x 1 with Viscous Lidocaine 15 mg p.o. x1, 20 mg of dicyclomine orally, morphine 4 mg IVP x1 and Zofran 4 mg IVP.  I added famotidine 20 mg IVPB.  CBC showed a white count of 8.1, hemoglobin 13.5 g/dL and platelets 207.  Chemistry shows a glucose of 102 mg/dL.  Renal function and electrolytes are normal when calcium is corrected to albumin.  Total protein 6.4 and albumin 3.1 g/dL.  The rest of the hepatic functions are within expected range.  Lipase level was 20.  Troponin x2 was 4 ng/L.  Initial EKG showed sinus rhythm with artifact, second EKG is sinus rhythm with baseline wander and V6.  No ischemic changes seen.  His chest radiograph did not have any active  cardiopulmonary disease.  Review of Systems: As per HPI otherwise 10 point review of systems negative.   Past Medical History:  Diagnosis Date  . Allergy    seasonal  . Anxiety   . Depression   . GERD (gastroesophageal reflux disease)   . Hypertension   . OA (osteoarthritis) of knee   . Rotator cuff tear arthropathy of both shoulders    On DISABILITY, non operative case  . Shoulder pain   . Vertigo     Past Surgical History:  Procedure Laterality Date  . CARDIAC CATHETERIZATION  06/2005   no significant CAD  . HERNIA REPAIR  1983  . ROTATOR CUFF REPAIR Bilateral   . TONSILLECTOMY  1977     reports that he has been smoking cigarettes. He has a 43.00 pack-year smoking history. He has never used smokeless tobacco. He reports current alcohol use of about 48.0 standard drinks of alcohol per week. He reports that he does not use drugs.  No Known Allergies  Family History  Problem Relation Age of Onset  . Heart disease Mother   . Diabetes Mother   . Hypertension Mother   . Heart disease Father   . Hypertension Father   . Stroke Other   . Cancer Other   . Cancer Brother    Prior to Admission medications   Medication Sig Start Date End Date Taking? Authorizing Provider  albuterol (PROVENTIL HFA;VENTOLIN HFA) 108 (90 Base) MCG/ACT inhaler Inhale 2 puffs into the lungs every 6 (six) hours  as needed for wheezing or shortness of breath. 10/13/17  Yes Orlena Sheldon, PA-C  ALPRAZolam Duanne Moron) 0.5 MG tablet Take 1 tablet (0.5 mg total) by mouth at bedtime as needed for anxiety. 01/02/19  Yes [place], Modena Nunnery, MD  amLODipine (NORVASC) 5 MG tablet Take 1 tablet (5 mg total) by mouth daily. 08/29/18 08/29/19 Yes [place], Modena Nunnery, MD  aspirin 81 MG tablet Take 81 mg by mouth daily.   Yes [provider]  atenolol (TENORMIN) 50 MG tablet Take 1 tablet (50 mg total) by mouth daily. 08/29/18  Yes [place], Modena Nunnery, MD  atorvastatin (LIPITOR) 20 MG tablet Take 1 tablet at bedtime for  cholesterol 08/29/18  Yes [place], Modena Nunnery, MD  Cyanocobalamin (B-12 PO) Take 2 tablets by mouth daily.   Yes [provider]  meclizine (ANTIVERT) 25 MG tablet TAKE 1 TABLET BY MOUTH THREE TIMES DAILY AS NEEDED FOR DIZZINESS 02/14/19  Yes [place], Modena Nunnery, MD  Menthol-Camphor (TIGER BALM ARTHRITIS RUB) 11-11 % CREA Apply topically.   Yes [provider]  naproxen sodium (ANAPROX) 220 MG tablet Take 220 mg by mouth 2 (two) times daily with a meal.   Yes [provider]  pantoprazole (PROTONIX) 40 MG tablet Take 1 tablet (40 mg total) by mouth daily. 08/29/18  Yes , Modena Nunnery, MD  umeclidinium-vilanterol [facility] ELLIPTA) 62.5-25 MCG/INH AEPB Inhale 1 puff into the lungs daily. 08/29/18  Yes Alycia Rossetti, MD    Physical Exam: Vitals:   03/18/19 2100 03/18/19 2130 03/18/19 2200 03/19/19 0000  BP: (!) 148/81 (!) 142/85 136/84 134/82  Pulse: 75 72 69 71  Resp: 17 15 15 18   Temp:      SpO2: 93% 95% 96% 97%  Weight:        Constitutional: NAD, calm, comfortable Eyes: PERRL, lids and conjunctivae normal ENMT: Mucous membranes are moist. Posterior pharynx clear of any exudate or lesions. Neck: normal, supple, no masses, no thyromegaly Respiratory: clear to auscultation bilaterally, no wheezing, no crackles. Normal respiratory effort. No accessory muscle use.  Cardiovascular: Regular rate and rhythm, no murmurs / rubs / gallops. No extremity edema. 2+ pedal pulses. No carotid bruits.  Abdomen: Nondistended.  BS positive.  Soft, no tenderness, no guarding or rebound, no masses palpated. No hepatosplenomegaly. Musculoskeletal: no clubbing / cyanosis.  Good ROM, no contractures. Normal muscle tone.  Skin: no significant rashes, lesions, ulcers on limited dermatological examination. Neurologic: CN 2-12 grossly intact. Sensation intact, DTR normal. Strength 5/5 in all 4.  Psychiatric: Normal judgment and insight. Alert and oriented x 3. Normal mood.   Labs on  Admission: I have personally reviewed following labs and imaging studies  CBC: Recent Labs  Lab 03/18/19 2034  WBC 8.1  HGB 13.5  HCT 40.6  MCV 99.5  PLT [id number]   Basic Metabolic Panel: Recent Labs  Lab 03/18/19 2034  NA 138  K 3.5  CL 104  CO2 25  GLUCOSE 102*  BUN 8  CREATININE 0.78  CALCIUM 8.8*   GFR: Estimated Creatinine Clearance: 89.2 mL/min (by C-G formula based on SCr of 0.78 mg/dL). Liver Function Tests: Recent Labs  Lab 03/18/19 2034  AST 20  ALT 12  ALKPHOS 45  BILITOT 0.4  PROT 6.4*  ALBUMIN 3.1*   Recent Labs  Lab 03/18/19 2034  LIPASE 20   No results for input(s): AMMONIA in the last 168 hours. Coagulation Profile: No results for input(s): INR, PROTIME in the last 168 hours. Cardiac Enzymes: No  results for input(s): CKTOTAL, CKMB, CKMBINDEX, TROPONINI in the last 168 hours. BNP (last 3 results) No results for input(s): PROBNP in the last 8760 hours. HbA1C: No results for input(s): HGBA1C in the last 72 hours. CBG: No results for input(s): GLUCAP in the last 168 hours. Lipid Profile: No results for input(s): CHOL, HDL, LDLCALC, TRIG, CHOLHDL, LDLDIRECT in the last 72 hours. Thyroid Function Tests: No results for input(s): TSH, T4TOTAL, FREET4, T3FREE, THYROIDAB in the last 72 hours. Anemia Panel: No results for input(s): VITAMINB12, FOLATE, FERRITIN, TIBC, IRON, RETICCTPCT in the last 72 hours.  Radiological Exams on Admission: DG Chest 2 View  Result Date: 03/18/2019 CLINICAL DATA:  Chest pain EXAM: CHEST - 2 VIEW COMPARISON:  Chest radiograph dated 05/19/2013 FINDINGS: The heart size and mediastinal contours are within normal limits. There is minimal left basilar atelectasis. The right lung is clear. There is no pleural effusion or pneumothorax. The visualized skeletal structures are unremarkable. IMPRESSION: No active cardiopulmonary disease. Electronically Signed   By: Zerita Boers M.D.   On: 03/18/2019 20:13    EKG: Independently  reviewed.  EKG#1 Vent. rate 76 BPM PR interval 152 ms QRS duration 96 ms QT/QTc 408/459 ms P-R-T axes -22 -55 -26 Normal sinus rhythm Left axis deviation Incomplete right bundle branch block Inferior infarct , age undetermined Abnormal ECG  EKG#2 Vent. rate 68 BPM PR interval * ms QRS duration 109 ms QT/QTc 435/463 ms P-R-T axes 70 67 46 Sinus rhythm Baseline wander in lead(s) V6  Assessment/Plan Principal Problem:   Chest pain Pain seems to be atypical. Multiple risk factors for ASCVD. Observation/telemetry. Continue aspirin. Continue atenolol. Continue atorvastatin. Check echocardiogram.  Active Problems:   Hypertension Continue amlodipine 5 mg p.o. daily. Continue atenolol 50 mg p.o. daily. Follow-up BP and heart rate.    COPD (chronic obstructive pulmonary disease) (HCC) Asymptomatic at this time. Continue Anoro Ellipta. Supplemental oxygen and bronchodilators as needed.    Tobacco use Nicotine replacement therapy offered. Advised to cease smoking.    GERD (gastroesophageal reflux disease) Continue pantoprazole 40 mg p.o. daily. Famotidine 20 mg IVP x1. Antiacid as needed. Analgesics as needed.    Hyperlipemia Continue atorvastatin 20 mg p.o. daily.   DVT prophylaxis: SCDs. Code Status: Full code. Family Communication:  Disposition Plan: Observation for chest pain. Consults called:  Admission status: Observation/telemetry.   Reubin Milan MD [facility]  If 7PM-7AM, please contact night-coverage [email or web address]  03/19/2019, 1:15 AM   This document was prepared using Dragon voice recognition software and may contain some unintended transcription errors.

## 2019-03-19 NOTE — ED Notes (Signed)
Patient' contact/ niece Bill Johnson  [phone]

## 2019-03-19 NOTE — Discharge Instructions (Signed)
Nonspecific Chest Pain, Adult Chest pain can be caused by many different conditions. It can be caused by a condition that is life-threatening and requires treatment right away. It can also be caused by something that is not life-threatening. If you have chest pain, it can be hard to know the difference, so it is important to get help right away to make sure that you do not have a serious condition. Some life-threatening causes of chest pain include:  Heart attack.  A tear in the body's main blood vessel (aortic dissection).  Inflammation around your heart (pericarditis).  A problem in the lungs, such as a blood clot (pulmonary embolism) or a collapsed lung (pneumothorax). Some non life-threatening causes of chest pain include:  Heartburn.  Anxiety or stress.  Damage to the bones, muscles, and cartilage that make up your chest wall.  Pneumonia or bronchitis.  Shingles infection (varicella-zoster virus). Chest pain can feel like:  Pain or discomfort on the surface of your chest or deep in your chest.  Crushing, pressure, aching, or squeezing pain.  Burning or tingling.  Dull or sharp pain that is worse when you move, cough, or take a deep breath.  Pain or discomfort that is also felt in your back, neck, jaw, shoulder, or arm, or pain that spreads to any of these areas. Your chest pain may come and go. It may also be constant. Your health care provider will do lab tests and other studies to find the cause of your pain. Treatment will depend on the cause of your chest pain. Follow these instructions at home: Medicines  Take over-the-counter and prescription medicines only as told by your health care provider.  If you were prescribed an antibiotic, take it as told by your health care provider. Do not stop taking the antibiotic even if you start to feel better. Lifestyle   Rest as directed by your health care provider.  Do not use any products that contain nicotine or tobacco,  such as cigarettes and e-cigarettes. If you need help quitting, ask your health care provider.  Do not drink alcohol.  Make healthy lifestyle choices as recommended. These may include: ? Getting regular exercise. Ask your health care provider to suggest some activities that are safe for you. ? Eating a heart-healthy diet. This includes plenty of fresh fruits and vegetables, whole grains, low-fat (lean) protein, and low-fat dairy products. A dietitian can help you find healthy eating options. ? Maintaining a healthy weight. ? Managing any other health conditions you have, such as high blood pressure (hypertension) or diabetes. ? Reducing stress, such as with yoga or relaxation techniques. General instructions  Pay attention to any changes in your symptoms. Tell your health care provider about them or any new symptoms.  Avoid any activities that cause chest pain.  Keep all follow-up visits as told by your health care provider. This is important. This includes visits for any further testing if your chest pain does not go away. Contact a health care provider if:  Your chest pain does not go away.  You feel depressed.  You have a fever. Get help right away if:  Your chest pain gets worse.  You have a cough that gets worse, or you cough up blood.  You have severe pain in your abdomen.  You faint.  You have sudden, unexplained chest discomfort.  You have sudden, unexplained discomfort in your arms, back, neck, or jaw.  You have shortness of breath at any time.  You suddenly start   to sweat, or your skin gets clammy.  You feel nausea or you vomit.  You suddenly feel lightheaded or dizzy.  You have severe weakness, or unexplained weakness or fatigue.  Your heart begins to beat quickly, or it feels like it is skipping beats. These symptoms may represent a serious problem that is an emergency. Do not wait to see if the symptoms will go away. Get medical help right away. Call your  local emergency services (911 in the [place] Do not drive yourself to the hospital. Summary  Chest pain can be caused by a condition that is serious and requires urgent treatment. It may also be caused by something that is not life-threatening.  If you have chest pain, it is very important to see your health care provider. Your health care provider may do lab tests and other studies to find the cause of your pain.  Follow your health care provider's instructions on taking medicines, making lifestyle changes, and getting emergency treatment if symptoms become worse.  Keep all follow-up visits as told by your health care provider. This includes visits for any further testing if your chest pain does not go away. This information is not intended to replace advice given to you by your health care provider. Make sure you discuss any questions you have with your health care provider. Document Revised: 07/15/2017 Document Reviewed: 07/15/2017 Elsevier Patient Education  2020 [place] for Gastroesophageal Reflux Disease, Adult When you have gastroesophageal reflux disease (GERD), the foods you eat and your eating habits are very important. Choosing the right foods can help ease the discomfort of GERD. Consider working with a diet and nutrition specialist (dietitian) to help you make healthy food choices. What general guidelines should I follow?  Eating plan  Choose healthy foods low in fat, such as fruits, vegetables, whole grains, low-fat dairy products, and lean meat, fish, and poultry.  Eat frequent, small meals instead of three large meals each day. Eat your meals slowly, in a relaxed setting. Avoid bending over or lying down until 2-3 hours after eating.  Limit high-fat foods such as fatty meats or fried foods.  Limit your intake of oils, butter, and shortening to less than 8 teaspoons each day.  Avoid the following: ? Foods that cause symptoms. These may be different  for different people. Keep a food diary to keep track of foods that cause symptoms. ? Alcohol. ? Drinking large amounts of liquid with meals. ? Eating meals during the 2-3 hours before bed.  Cook foods using methods other than frying. This may include baking, grilling, or broiling. Lifestyle  Maintain a healthy weight. Ask your health care provider what weight is healthy for you. If you need to lose weight, work with your health care provider to do so safely.  Exercise for at least 30 minutes on 5 or more days each week, or as told by your health care provider.  Avoid wearing clothes that fit tightly around your waist and chest.  Do not use any products that contain nicotine or tobacco, such as cigarettes and e-cigarettes. If you need help quitting, ask your health care provider.  Sleep with the head of your bed raised. Use a wedge under the mattress or blocks under the bed frame to raise the head of the bed. What foods are not recommended? The items listed may not be a complete list. Talk with your dietitian about what dietary choices are best for you. Grains Pastries or quick breads  with added fat. [place] toast. Vegetables Deep fried vegetables. [place] fries. Any vegetables prepared with added fat. Any vegetables that cause symptoms. For some people this may include tomatoes and tomato products, chili peppers, onions and garlic, and horseradish. Fruits Any fruits prepared with added fat. Any fruits that cause symptoms. For some people this may include citrus fruits, such as oranges, grapefruit, pineapple, and lemons. Meats and other protein foods High-fat meats, such as fatty beef or pork, hot dogs, ribs, ham, sausage, salami and bacon. Fried meat or protein, including fried fish and fried chicken. Nuts and nut butters. Dairy Whole milk and chocolate milk. Sour cream. Cream. Ice cream. Cream cheese. Milk shakes. Beverages Coffee and tea, with or without caffeine. Carbonated beverages.  Sodas. Energy drinks. Fruit juice made with acidic fruits (such as orange or grapefruit). Tomato juice. Alcoholic drinks. Fats and oils Butter. Margarine. Shortening. Ghee. Sweets and desserts Chocolate and cocoa. Donuts. Seasoning and other foods Pepper. Peppermint and spearmint. Any condiments, herbs, or seasonings that cause symptoms. For some people, this may include curry, hot sauce, or vinegar-based salad dressings. Summary  When you have gastroesophageal reflux disease (GERD), food and lifestyle choices are very important to help ease the discomfort of GERD.  Eat frequent, small meals instead of three large meals each day. Eat your meals slowly, in a relaxed setting. Avoid bending over or lying down until 2-3 hours after eating.  Limit high-fat foods such as fatty meat or fried foods. This information is not intended to replace advice given to you by your health care provider. Make sure you discuss any questions you have with your health care provider. Document Revised: 05/05/2018 Document Reviewed: 01/14/2016 Elsevier Patient Education  [place].   Gastroesophageal Reflux Disease, Adult Gastroesophageal reflux (GER) happens when acid from the stomach flows up into the tube that connects the mouth and the stomach (esophagus). Normally, food travels down the esophagus and stays in the stomach to be digested. However, when a person has GER, food and stomach acid sometimes move back up into the esophagus. If this becomes a more serious problem, the person may be diagnosed with a disease called gastroesophageal reflux disease (GERD). GERD occurs when the reflux:  Happens often.  Causes frequent or severe symptoms.  Causes problems such as damage to the esophagus. When stomach acid comes in contact with the esophagus, the acid may cause soreness (inflammation) in the esophagus. Over time, GERD may create small holes (ulcers) in the lining of the esophagus. What are the  causes? This condition is caused by a problem with the muscle between the esophagus and the stomach (lower esophageal sphincter, or LES). Normally, the LES muscle closes after food passes through the esophagus to the stomach. When the LES is weakened or abnormal, it does not close properly, and that allows food and stomach acid to go back up into the esophagus. The LES can be weakened by certain dietary substances, medicines, and medical conditions, including:  Tobacco use.  Pregnancy.  Having a hiatal hernia.  Alcohol use.  Certain foods and beverages, such as coffee, chocolate, onions, and peppermint. What increases the risk? You are more likely to develop this condition if you:  Have an increased body weight.  Have a connective tissue disorder.  Use NSAID medicines. What are the signs or symptoms? Symptoms of this condition include:  Heartburn.  Difficult or painful swallowing.  The feeling of having a lump in the throat.  Abitter taste in the mouth.  Bad  breath.  Having a large amount of saliva.  Having an upset or bloated stomach.  Belching.  Chest pain. Different conditions can cause chest pain. Make sure you see your health care provider if you experience chest pain.  Shortness of breath or wheezing.  Ongoing (chronic) cough or a night-time cough.  Wearing away of tooth enamel.  Weight loss. How is this diagnosed? Your health care provider will take a medical history and perform a physical exam. To determine if you have mild or severe GERD, your health care provider may also monitor how you respond to treatment. You may also have tests, including:  A test to examine your stomach and esophagus with a small camera (endoscopy).  A test thatmeasures the acidity level in your esophagus.  A test thatmeasures how much pressure is on your esophagus.  A barium swallow or modified barium swallow test to show the shape, size, and functioning of your  esophagus. How is this treated? The goal of treatment is to help relieve your symptoms and to prevent complications. Treatment for this condition may vary depending on how severe your symptoms are. Your health care provider may recommend:  Changes to your diet.  Medicine.  Surgery. Follow these instructions at home: Eating and drinking   Follow a diet as recommended by your health care provider. This may involve avoiding foods and drinks such as: ? Coffee and tea (with or without caffeine). ? Drinks that containalcohol. ? Energy drinks and sports drinks. ? Carbonated drinks or sodas. ? Chocolate and cocoa. ? Peppermint and mint flavorings. ? Garlic and onions. ? Horseradish. ? Spicy and acidic foods, including peppers, chili powder, curry powder, vinegar, hot sauces, and barbecue sauce. ? Citrus fruit juices and citrus fruits, such as oranges, lemons, and limes. ? Tomato-based foods, such as red sauce, chili, salsa, and pizza with red sauce. ? Fried and fatty foods, such as donuts, french fries, potato chips, and high-fat dressings. ? High-fat meats, such as hot dogs and fatty cuts of red and white meats, such as rib eye steak, sausage, ham, and bacon. ? High-fat dairy items, such as whole milk, butter, and cream cheese.  Eat small, frequent meals instead of large meals.  Avoid drinking large amounts of liquid with your meals.  Avoid eating meals during the 2-3 hours before bedtime.  Avoid lying down right after you eat.  Do not exercise right after you eat. Lifestyle   Do not use any products that contain nicotine or tobacco, such as cigarettes, e-cigarettes, and chewing tobacco. If you need help quitting, ask your health care provider.  Try to reduce your stress by using methods such as yoga or meditation. If you need help reducing stress, ask your health care provider.  If you are overweight, reduce your weight to an amount that is healthy for you. Ask your health  care provider for guidance about a safe weight loss goal. General instructions  Pay attention to any changes in your symptoms.  Take over-the-counter and prescription medicines only as told by your health care provider. Do not take aspirin, ibuprofen, or other NSAIDs unless your health care provider told you to do so.  Wear loose-fitting clothing. Do not wear anything tight around your waist that causes pressure on your abdomen.  Raise (elevate) the head of your bed about 6 inches (15 cm).  Avoid bending over if this makes your symptoms worse.  Keep all follow-up visits as told by your health care provider. This is important. Contact  a health care provider if:  You have: ? New symptoms. ? Unexplained weight loss. ? Difficulty swallowing or it hurts to swallow. ? Wheezing or a persistent cough. ? A hoarse voice.  Your symptoms do not improve with treatment. Get help right away if you:  Have pain in your arms, neck, jaw, teeth, or back.  Feel sweaty, dizzy, or light-headed.  Have chest pain or shortness of breath.  Vomit and your vomit looks like blood or coffee grounds.  Faint.  Have stool that is bloody or black.  Cannot swallow, drink, or eat. Summary  Gastroesophageal reflux happens when acid from the stomach flows up into the esophagus. GERD is a disease in which the reflux happens often, causes frequent or severe symptoms, or causes problems such as damage to the esophagus.  Treatment for this condition may vary depending on how severe your symptoms are. Your health care provider may recommend diet and lifestyle changes, medicine, or surgery.  Contact a health care provider if you have new or worsening symptoms.  Take over-the-counter and prescription medicines only as told by your health care provider. Do not take aspirin, ibuprofen, or other NSAIDs unless your health care provider told you to do so.  Keep all follow-up visits as told by your health care  provider. This is important. This information is not intended to replace advice given to you by your health care provider. Make sure you discuss any questions you have with your health care provider. Document Revised: 07/21/2017 Document Reviewed: 07/21/2017 Elsevier Patient Education  [place].   IMPORTANT INFORMATION: PAY CLOSE ATTENTION   PHYSICIAN DISCHARGE INSTRUCTIONS  Follow with Primary care provider  Alycia Rossetti, MD  and other consultants as instructed by your Hospitalist Physician  [place] IF SYMPTOMS COME BACK, WORSEN OR NEW PROBLEM DEVELOPS   Please note: You were cared for by a hospitalist during your hospital stay. Every effort will be made to forward records to your primary care provider.  You can request that your primary care provider send for your hospital records if they have not received them.  Once you are discharged, your primary care physician will handle any further medical issues. Please note that NO REFILLS for any discharge medications will be authorized once you are discharged, as it is imperative that you return to your primary care physician (or establish a relationship with a primary care physician if you do not have one) for your post hospital discharge needs so that they can reassess your need for medications and monitor your lab values.  Please get a complete blood count and chemistry panel checked by your Primary MD at your next visit, and again as instructed by your Primary MD.  Get Medicines reviewed and adjusted: Please take all your medications with you for your next visit with your Primary MD  Laboratory/radiological data: Please request your Primary MD to go over all hospital tests and procedure/radiological results at the follow up, please ask your primary care provider to get all Hospital records sent to his/her office.  In some cases, they will be blood work, cultures and biopsy results pending  at the time of your discharge. Please request that your primary care provider follow up on these results.  If you are diabetic, please bring your blood sugar readings with you to your follow up appointment with primary care.    Please call and make your follow up appointments as soon as possible.  Also Note the following: If you experience worsening of your admission symptoms, develop shortness of breath, life threatening emergency, suicidal or homicidal thoughts you must seek medical attention immediately by calling 911 or calling your MD immediately  if symptoms less severe.  You must read complete instructions/literature along with all the possible adverse reactions/side effects for all the Medicines you take and that have been prescribed to you. Take any new Medicines after you have completely understood and accpet all the possible adverse reactions/side effects.   Do not drive when taking Pain medications or sleeping medications (Benzodiazepines)  Do not take more than prescribed Pain, Sleep and Anxiety Medications. It is not advisable to combine anxiety,sleep and pain medications without talking with your primary care practitioner  Special Instructions: If you have smoked or chewed Tobacco  in the last 2 yrs please stop smoking, stop any regular Alcohol  and or any Recreational drug use.  Wear Seat belts while driving.  Do not drive if taking any narcotic, mind altering or controlled substances or recreational drugs or alcohol.

## 2019-03-19 NOTE — Progress Notes (Unsigned)
Sent new Rx for lipitor 40 mg daily.

## 2019-03-19 NOTE — Discharge Summary (Signed)
Physician Discharge Summary  Bill Johnson [id number] DOB: 16-Jun-1955 DOA: 03/18/2019  PCP: Alycia Rossetti, MD  Admit date: 03/18/2019 Discharge date: 03/19/2019  Admitted From:  Home  Disposition:  Home   Recommendations for Outpatient Follow-up:  1. Follow up with PCP in 1 weeks 2. Please obtain BMP/CBC in one week 3. Please follow up on the following pending results: final 2D echocardiogram   Discharge Condition: STABLE   CODE STATUS: FULL    Brief Hospitalization Summary: Please see all hospital notes, images, labs for full details of the hospitalization. HPI: Bill Johnson is a 64 y.o. male with medical history significant of allergies, anxiety, depression, GERD, hypertension, osteoarthritis of the knee, rotator cuff disease arthropathies of both shoulders, chronic shoulder pain, history of vertigo who is coming to the emergency department due to substernal chest pain since this morning associated with dyspnea on exertion and flatulence.  He denies pain radiation, palpitations, diaphoresis, PND, orthopnea or pitting edema of the lower extremities.  Nausea, emesis, diarrhea, constipation, melena or hematochezia.  No dysuria, frequency or hematuria.  He mentions that he tried to treated at home, but the symptoms persisted.  He has been taking Aleve for arthritic pain.  This is the first time he has had symptoms since he was put on Protonix by his doctor last year.  ED Course: Initial vital signs were temperature 98 F, pulse 86, respirations 19, blood pressure 170/89 mmHg and O2 sat 98% on room air.  The patient was given Maalox 30 mL x 1 with Viscous Lidocaine 15 mg p.o. x1, 20 mg of dicyclomine orally, morphine 4 mg IVP x1 and Zofran 4 mg IVP.  I added famotidine 20 mg IVPB.  CBC showed a white count of 8.1, hemoglobin 13.5 g/dL and platelets 207.  Chemistry shows a glucose of 102 mg/dL.  Renal function and electrolytes are normal when calcium is corrected to albumin.  Total  protein 6.4 and albumin 3.1 g/dL.  The rest of the hepatic functions are within expected range.  Lipase level was 20.  Troponin x2 was 4 ng/L.  Initial EKG showed sinus rhythm with artifact, second EKG is sinus rhythm with baseline wander and V6.  No ischemic changes seen.  His chest radiograph did not have any active cardiopulmonary disease.  The patient was admitted for observation for atypical chest pain symptoms.  The patient has had negligible high-sensitivity troponin tests.  His symptoms are mostly GI related.  He was monitored on telemetry and that remained stable as well.  A 2D echocardiogram was ordered and results are pending.  He is feeling better.  Patient is referred to outpatient gastroenterology and cardiology.  He reports that he will follow-up with his PCP.  I have increased his Protonix to twice daily.  Have given him information on GERD diet.  He is stable to discharge home at this time.  Discharge Diagnoses:  Principal Problem:   Chest pain Active Problems:   Hypertension   COPD (chronic obstructive pulmonary disease) (HCC)   Tobacco use   GERD (gastroesophageal reflux disease)   Hyperlipemia   Discharge Instructions: Discharge Instructions    Ambulatory referral to Cardiology   Complete by: As directed    Ambulatory referral to Gastroenterology   Complete by: As directed      Allergies as of 03/19/2019   No Known Allergies     Medication List    STOP taking these medications   naproxen sodium 220 MG tablet Commonly known as: ALEVE  TAKE these medications   albuterol 108 (90 Base) MCG/ACT inhaler Commonly known as: VENTOLIN HFA Inhale 2 puffs into the lungs every 6 (six) hours as needed for wheezing or shortness of breath.   ALPRAZolam 0.5 MG tablet Commonly known as: Xanax Take 1 tablet (0.5 mg total) by mouth at bedtime as needed for anxiety.   amLODipine 5 MG tablet Commonly known as: NORVASC Take 1 tablet (5 mg total) by mouth daily.   aspirin  81 MG tablet Take 81 mg by mouth daily.   atenolol 50 MG tablet Commonly known as: TENORMIN Take 1 tablet (50 mg total) by mouth daily.   atorvastatin 20 MG tablet Commonly known as: LIPITOR Take 1 tablet at bedtime for cholesterol   B-12 PO Take 2 tablets by mouth daily.   meclizine 25 MG tablet Commonly known as: ANTIVERT TAKE 1 TABLET BY MOUTH THREE TIMES DAILY AS NEEDED FOR DIZZINESS   pantoprazole 40 MG tablet Commonly known as: PROTONIX Take 1 tablet (40 mg total) by mouth 2 (two) times daily before a meal. What changed: when to take this   Tiger Balm Arthritis Rub 11-11 % Crea Generic drug: Menthol-Camphor Apply topically.   umeclidinium-vilanterol 62.5-25 MCG/INH Aepb Commonly known as: ANORO ELLIPTA Inhale 1 puff into the lungs daily.      Follow-up Information    [place], Modena Nunnery, MD. Schedule an appointment as soon as possible for a visit in 1 week(s).   Specialty: Family Medicine Contact information: [address] [place] [address] [phone]        [facility]. Schedule an appointment as soon as possible for a visit in 2 week(s).   Why: Establish care for GERD Contact information: [address] [place] [phone]         No Known Allergies Allergies as of 03/19/2019   No Known Allergies     Medication List    STOP taking these medications   naproxen sodium 220 MG tablet Commonly known as: ALEVE     TAKE these medications   albuterol 108 (90 Base) MCG/ACT inhaler Commonly known as: VENTOLIN HFA Inhale 2 puffs into the lungs every 6 (six) hours as needed for wheezing or shortness of breath.   ALPRAZolam 0.5 MG tablet Commonly known as: Xanax Take 1 tablet (0.5 mg total) by mouth at bedtime as needed for anxiety.   amLODipine 5 MG tablet Commonly known as: NORVASC Take 1 tablet (5 mg total) by mouth daily.   aspirin 81 MG tablet Take 81 mg by mouth daily.    atenolol 50 MG tablet Commonly known as: TENORMIN Take 1 tablet (50 mg total) by mouth daily.   atorvastatin 20 MG tablet Commonly known as: LIPITOR Take 1 tablet at bedtime for cholesterol   B-12 PO Take 2 tablets by mouth daily.   meclizine 25 MG tablet Commonly known as: ANTIVERT TAKE 1 TABLET BY MOUTH THREE TIMES DAILY AS NEEDED FOR DIZZINESS   pantoprazole 40 MG tablet Commonly known as: PROTONIX Take 1 tablet (40 mg total) by mouth 2 (two) times daily before a meal. What changed: when to take this   Tiger Balm Arthritis Rub 11-11 % Crea Generic drug: Menthol-Camphor Apply topically.   umeclidinium-vilanterol 62.5-25 MCG/INH Aepb Commonly known as: ANORO ELLIPTA Inhale 1 puff into the lungs daily.       Procedures/Studies: DG Chest 2 View  Result Date: 03/18/2019 CLINICAL DATA:  Chest pain EXAM: CHEST - 2 VIEW COMPARISON:  Chest radiograph dated 05/19/2013 FINDINGS: The heart size and mediastinal contours are within normal limits. There is minimal left basilar atelectasis. The right lung is clear. There is no pleural effusion or pneumothorax. The visualized skeletal structures are unremarkable. IMPRESSION: No active cardiopulmonary disease. Electronically Signed   By: Zerita Boers M.D.   On: 03/18/2019 20:13      Subjective: The patient says he is feeling better at this time.  He is having no more chest pain symptoms.  He does have occasional acid reflux symptoms.  He is having no shortness of breath.  He is having no palpitations.  Discharge Exam: Vitals:   03/19/19 0840 03/19/19 0942  BP:  (!) 159/82  Pulse:  76  Resp:    Temp:    SpO2: 95%    Vitals:   03/19/19 0208 03/19/19 0553 03/19/19 0840 03/19/19 0942  BP: (!) 142/95 (!) 156/85  (!) 159/82  Pulse: 63 68  76  Resp:      Temp:  97.7 F (36.5 C)    TempSrc:  Oral    SpO2:  97% 95%   Weight:      Height:       General: Pt is alert, awake, not in acute distress Cardiovascular: RRR, S1/S2 +,  no rubs, no gallops Respiratory: CTA bilaterally, no wheezing, no rhonchi Abdominal: Soft, NT, ND, bowel sounds + Extremities: no edema, no cyanosis   The results of significant diagnostics from this hospitalization (including imaging, microbiology, ancillary and laboratory) are listed below for reference.     Microbiology: No results found for this or any previous visit (from the past 240 hour(s)).   Labs: BNP (last 3 results) No results for input(s): BNP in the last 8760 hours. Basic Metabolic Panel: Recent Labs  Lab 03/18/19 2034  NA 138  K 3.5  CL 104  CO2 25  GLUCOSE 102*  BUN 8  CREATININE 0.78  CALCIUM 8.8*   Liver Function Tests: Recent Labs  Lab 03/18/19 2034  AST 20  ALT 12  ALKPHOS 45  BILITOT 0.4  PROT 6.4*  ALBUMIN 3.1*   Recent Labs  Lab 03/18/19 2034  LIPASE 20   No results for input(s): AMMONIA in the last 168 hours. CBC: Recent Labs  Lab 03/18/19 2034  WBC 8.1  HGB 13.5  HCT 40.6  MCV 99.5  PLT 207   Cardiac Enzymes: No results for input(s): CKTOTAL, CKMB, CKMBINDEX, TROPONINI in the last 168 hours. BNP: Invalid input(s): POCBNP CBG: No results for input(s): GLUCAP in the last 168 hours. D-Dimer No results for input(s): DDIMER in the last 72 hours. Hgb A1c No results for input(s): HGBA1C in the last 72 hours. Lipid Profile No results for input(s): CHOL, HDL, LDLCALC, TRIG, CHOLHDL, LDLDIRECT in the last 72 hours. Thyroid function studies No results for input(s): TSH, T4TOTAL, T3FREE, THYROIDAB in the last 72 hours.  Invalid input(s): FREET3 Anemia work up No results for input(s): VITAMINB12, FOLATE, FERRITIN, TIBC, IRON, RETICCTPCT in the last 72 hours. Urinalysis    Component Value Date/Time   COLORURINE YELLOW 05/19/2013 [place] 05/19/2013 0854   LABSPEC 1.010 05/19/2013 0854   PHURINE 5.0 05/19/2013 [place] 05/19/2013 0854   HGBUR NEGATIVE 05/19/2013 0854   BILIRUBINUR NEGATIVE  05/19/2013 0854   KETONESUR NEGATIVE 05/19/2013 0854   PROTEINUR NEGATIVE 05/19/2013 0854   UROBILINOGEN 0.2 05/19/2013 0854   NITRITE NEGATIVE 05/19/2013 0854   LEUKOCYTESUR NEGATIVE 05/19/2013 0854   Sepsis Labs  Invalid input(s): PROCALCITONIN,  WBC,  LACTICIDVEN Microbiology No results found for this or any previous visit (from the past 240 hour(s)).  Time coordinating discharge: 30 mins  SIGNED:  Irwin Brakeman, MD  [facility] 03/19/2019, 12:08 PM How to contact the [facility] Attending or Consulting provider [place] or covering provider during after hours [place], for this patient?  1. Check the care team in [facility] and look for a) attending/consulting [facility] provider listed and b) the [facility] team listed 2. Log into [email or web address] and use [place]'s universal password to access. If you do not have the password, please contact the hospital operator. 3. Locate the [facility] provider you are looking for under [facility] and page to a number that you can be directly reached. 4. If you still have difficulty reaching the provider, please page the [facility] (Director on Call) for the Hospitalists listed on amion for assistance.

## 2019-03-19 NOTE — Progress Notes (Signed)
*  PRELIMINARY RESULTS* Echocardiogram 2D Echocardiogram has been performed.  [name] 03/19/2019, 12:32 PM

## 2019-03-20 ENCOUNTER — Telehealth: Payer: Self-pay | Admitting: Family Medicine

## 2019-03-20 ENCOUNTER — Telehealth: Payer: Self-pay

## 2019-03-20 MED ORDER — ATORVASTATIN CALCIUM 40 MG PO TABS: 40.0000 mg | ORAL_TABLET | Freq: Every day | ORAL | 3 refills | Status: DC

## 2019-03-20 NOTE — Telephone Encounter (Signed)
Patient needs refill on atorvastatin  [facility] [place]

## 2019-03-20 NOTE — Telephone Encounter (Signed)
Pt seen in the ER and was recommended to follow up with [place] in 2 weeks. Please advise if we can accept him as a new patient.

## 2019-03-21 NOTE — Telephone Encounter (Signed)
Pt aware of OV ?

## 2019-03-21 NOTE — Telephone Encounter (Signed)
Ok to schedule ov.  

## 2019-03-24 DIAGNOSIS — M12811 Other specific arthropathies, not elsewhere classified, right shoulder: Secondary | ICD-10-CM | POA: Diagnosis not present

## 2019-03-24 DIAGNOSIS — M12812 Other specific arthropathies, not elsewhere classified, left shoulder: Secondary | ICD-10-CM | POA: Diagnosis not present

## 2019-03-24 DIAGNOSIS — M5412 Radiculopathy, cervical region: Secondary | ICD-10-CM | POA: Diagnosis not present

## 2019-03-27 ENCOUNTER — Other Ambulatory Visit: Payer: Self-pay

## 2019-03-27 ENCOUNTER — Ambulatory Visit (INDEPENDENT_AMBULATORY_CARE_PROVIDER_SITE_OTHER): Payer: Medicare Other | Admitting: Family Medicine

## 2019-03-27 ENCOUNTER — Encounter: Payer: Self-pay | Admitting: Family Medicine

## 2019-03-27 VITALS — BP 136/78 | HR 60 | Temp 97.8°F | Resp 16 | Wt 178.0 lb

## 2019-03-27 DIAGNOSIS — E782 Mixed hyperlipidemia: Secondary | ICD-10-CM

## 2019-03-27 DIAGNOSIS — F4321 Adjustment disorder with depressed mood: Secondary | ICD-10-CM

## 2019-03-27 DIAGNOSIS — J439 Emphysema, unspecified: Secondary | ICD-10-CM

## 2019-03-27 DIAGNOSIS — I10 Essential (primary) hypertension: Secondary | ICD-10-CM | POA: Diagnosis not present

## 2019-03-27 DIAGNOSIS — F1010 Alcohol abuse, uncomplicated: Secondary | ICD-10-CM

## 2019-03-27 DIAGNOSIS — K219 Gastro-esophageal reflux disease without esophagitis: Secondary | ICD-10-CM | POA: Diagnosis not present

## 2019-03-27 MED ORDER — ATORVASTATIN CALCIUM 40 MG PO TABS: 40.0000 mg | ORAL_TABLET | Freq: Every day | ORAL | 3 refills | Status: DC

## 2019-03-27 MED ORDER — PANTOPRAZOLE SODIUM 40 MG PO TBEC: 40.0000 mg | DELAYED_RELEASE_TABLET | Freq: Two times a day (BID) | ORAL | 6 refills | Status: DC

## 2019-03-27 NOTE — Progress Notes (Signed)
Subjective:    Patient ID: Bill Johnson, male    DOB: [birth year], 64 y.o.   MRN: [id number]  Patient presents for Hospitalization Follow-up  Patient here for hospital follow-up.  Hospital discharge reviewed in detail.  He was admitted to the hospital secondary to substernal chest pain along with shortness of breath.  He also had some increased flatulence.  He had been taking NSAIDs for arthritis pain as well as his PPI. His labs were unremarkable at admission troponin was negative.  Chest x-ray was unremarkable EKG did not show acute MI.  He was given GI cocktail and admitted for overnight observation.  On telemetry his cardiac rhythm remained stable.  His Protonix was increased to twice a day and he was advised to follow-up with GI.  He was also recommended that he follow-up with cardiology as an outpatient due to risk factors for coronary artery disease.  His 2D echo was fairly normal with preserved ejection fraction as well as diastolic function. He has an appointment on Wednesday with gastroenterology   since he left hospital, has cut down on beer, down to 1-2 a day, cig 1-2 a day, was drinking on 8- 10beers a day.  States that he was not eating instead just drinking.  He was depressed sitting in the house by himself due to COVID-19 pandemic.  His brother is also going through cancer treatments.  He states no one comes by the house to see him or check on him.  He likes to get outside but because of the weather he has been unable to do so into this past weekend.  He has been taking Protonix but is only been taking it once a day and drinking Maalox in the afternoon.  His abdominal pain has improved since he cut back the beer.  Hyperlipidemia note that he was on statin drug Lipitor 40 mg his LDL had improved from 143 down to 108 2 months ago.  Lipid panel was drawn at the hospital on February 21 his LDL was back up to 153 and his total cholesterol 249 triglycerides 196 He has been ou tof his  lipitor a few weeks   Hypertension he is taking his amlodipine   Taking xanax 1 at bedtime    Review Of Systems:  GEN- denies fatigue, fever, weight loss,weakness, recent illness HEENT- denies eye drainage, change in vision, nasal discharge, CVS- denies chest pain, palpitations RESP- denies SOB, cough, wheeze ABD- denies N/V, change in stools, +abd pain GU- denies dysuria, hematuria, dribbling, incontinence MSK- denies joint pain, muscle aches, injury Neuro- denies headache, dizziness, syncope, seizure activity       Objective:    BP 136/78   Pulse 60   Temp 97.8 F (36.6 C) (Other (Comment))   Resp 16   Wt 178 lb (80.7 kg)   SpO2 98%   BMI 30.55 kg/m  GEN- NAD, alert and oriented x3, hard of hearing HEENT- PERRL, EOMI, non injected sclera, pink conjunctiva, MMM, oropharynx clear Neck- Supple, no thyromegaly CVS- RRR, no murmur RESP-CTAB ABD-NABS,soft, nondistended mild tenderness to palpation in epigastric region no rebound no guarding no CVA tenderness Psych normal affect and mood no suicidal ideations no hallucinations EXT- No edema Pulses- Radial, DP- 2+        Assessment & Plan:      Problem List Items Addressed This Visit      Unprioritized   Alcohol abuse   COPD (chronic obstructive pulmonary disease) (HCC)   GERD (gastroesophageal reflux disease)  Concern would be for gastritis versus possible ulceration which are much alcohol he had been ingesting.  Advised him to take his PPI twice a day.  He is going to follow-up Wednesday with GI.  He had benign abdominal exam today.      Relevant Medications   pantoprazole (PROTONIX) 40 MG tablet   Hyperlipemia   Relevant Medications   atorvastatin (LIPITOR) 40 MG tablet   Hypertension - Primary    Blood pressure controlled no change in medication.  Restarted on his statin drug.  Advise he do go ahead and follow-up with cardiology as he does have risk factors for coronary artery disease.      Relevant  Medications   atorvastatin (LIPITOR) 40 MG tablet    Other Visit Diagnoses    Situational depression       Situational depression in the setting of pandemic needing to stay within his home due to risk factors and his brother's cancer.  He does seem to understand the importance of staying off the alcohol and the effects on his body.  He is already cut down on his own.  He is still taking his benzo at bedtime I think this likely has kept him from going into any type of DTs I would not discontinue it.      Note: This dictation was prepared with Dragon dictation along with smaller phrase technology. Any transcriptional errors that result from this process are unintentional.

## 2019-03-27 NOTE — Assessment & Plan Note (Signed)
Blood pressure controlled no change in medication.  Restarted on his statin drug.  Advise he do go ahead and follow-up with cardiology as he does have risk factors for coronary artery disease.

## 2019-03-27 NOTE — Assessment & Plan Note (Signed)
Concern would be for gastritis versus possible ulceration which are much alcohol he had been ingesting.  Advised him to take his PPI twice a day.  He is going to follow-up Wednesday with GI.  He had benign abdominal exam today.

## 2019-03-27 NOTE — Patient Instructions (Addendum)
Take the protonix ( pantoprazole)  twice a day for your stomach  Take cholesterol at bedtime  F/U as previous

## 2019-03-29 ENCOUNTER — Ambulatory Visit (INDEPENDENT_AMBULATORY_CARE_PROVIDER_SITE_OTHER): Payer: Medicare Other | Admitting: Gastroenterology

## 2019-03-29 ENCOUNTER — Other Ambulatory Visit: Payer: Self-pay

## 2019-03-29 ENCOUNTER — Encounter: Payer: Self-pay | Admitting: Gastroenterology

## 2019-03-29 VITALS — BP 125/79 | HR 61 | Temp 97.2°F | Ht 64.0 in | Wt 176.4 lb

## 2019-03-29 DIAGNOSIS — K219 Gastro-esophageal reflux disease without esophagitis: Secondary | ICD-10-CM

## 2019-03-29 DIAGNOSIS — R140 Abdominal distension (gaseous): Secondary | ICD-10-CM | POA: Diagnosis not present

## 2019-03-29 DIAGNOSIS — R10812 Left upper quadrant abdominal tenderness: Secondary | ICD-10-CM | POA: Diagnosis not present

## 2019-03-29 DIAGNOSIS — R1012 Left upper quadrant pain: Secondary | ICD-10-CM | POA: Diagnosis not present

## 2019-03-29 NOTE — Assessment & Plan Note (Signed)
Chronic GERD for decades.  Recently with worsening symptoms in the setting of excessive alcohol use and chronic use of Aleve.  He has had some persisting upper abdominal pain despite dietary changes, decreasing alcohol use, and increasing pantoprazole to twice daily.  Differential diagnosis includes complicated GERD, gastritis, peptic ulcer disease, pancreatitis, malignancy.  His platelet counts normal, albumin is slightly low, cannot exclude underlying chronic liver disease.  We will plan for an EGD with deep sedation in the near future.  I have discussed the risks, alternatives, benefits with regards to but not limited to the risk of reaction to medication, bleeding, infection, perforation and the patient is agreeable to proceed. Written consent to be obtained.  I have encouraged him to continue pantoprazole 40 mg twice daily.  Continue to decrease alcohol consumption.  We will further evaluate his abdominal pain in the meantime with CT abdomen pelvis with contrast to rule out pancreatitis, underlying liver disease, malignancy.  Patient in agreement with plan.

## 2019-03-29 NOTE — Patient Instructions (Addendum)
1. Continue pantoprazole twice daily before breakfast and evening meal for acid reflux. 2. We will schedule you for an upper endoscopy to evaluate your chronic reflux symptoms, abdominal pain, and chest pain. See separate instructions.  3. In the meantime, we will get a CT scan of your abdomen to evaluate your left upper abdominal pain. We will contact you with results as available.     Gastroesophageal Reflux Disease, Adult Gastroesophageal reflux (GER) happens when acid from the stomach flows up into the tube that connects the mouth and the stomach (esophagus). Normally, food travels down the esophagus and stays in the stomach to be digested. However, when a person has GER, food and stomach acid sometimes move back up into the esophagus. If this becomes a more serious problem, the person may be diagnosed with a disease called gastroesophageal reflux disease (GERD). GERD occurs when the reflux:  Happens often.  Causes frequent or severe symptoms.  Causes problems such as damage to the esophagus. When stomach acid comes in contact with the esophagus, the acid may cause soreness (inflammation) in the esophagus. Over time, GERD may create small holes (ulcers) in the lining of the esophagus. What are the causes? This condition is caused by a problem with the muscle between the esophagus and the stomach (lower esophageal sphincter, or LES). Normally, the LES muscle closes after food passes through the esophagus to the stomach. When the LES is weakened or abnormal, it does not close properly, and that allows food and stomach acid to go back up into the esophagus. The LES can be weakened by certain dietary substances, medicines, and medical conditions, including:  Tobacco use.  Pregnancy.  Having a hiatal hernia.  Alcohol use.  Certain foods and beverages, such as coffee, chocolate, onions, and peppermint. What increases the risk? You are more likely to develop this condition if you:  Have  an increased body weight.  Have a connective tissue disorder.  Use NSAID medicines. What are the signs or symptoms? Symptoms of this condition include:  Heartburn.  Difficult or painful swallowing.  The feeling of having a lump in the throat.  Abitter taste in the mouth.  Bad breath.  Having a large amount of saliva.  Having an upset or bloated stomach.  Belching.  Chest pain. Different conditions can cause chest pain. Make sure you see your health care provider if you experience chest pain.  Shortness of breath or wheezing.  Ongoing (chronic) cough or a night-time cough.  Wearing away of tooth enamel.  Weight loss. How is this diagnosed? Your health care provider will take a medical history and perform a physical exam. To determine if you have mild or severe GERD, your health care provider may also monitor how you respond to treatment. You may also have tests, including:  A test to examine your stomach and esophagus with a small camera (endoscopy).  A test thatmeasures the acidity level in your esophagus.  A test thatmeasures how much pressure is on your esophagus.  A barium swallow or modified barium swallow test to show the shape, size, and functioning of your esophagus. How is this treated? The goal of treatment is to help relieve your symptoms and to prevent complications. Treatment for this condition may vary depending on how severe your symptoms are. Your health care provider may recommend:  Changes to your diet.  Medicine.  Surgery. Follow these instructions at home: Eating and drinking   Follow a diet as recommended by your health care provider. This  may involve avoiding foods and drinks such as: ? Coffee and tea (with or without caffeine). ? Drinks that containalcohol. ? Energy drinks and sports drinks. ? Carbonated drinks or sodas. ? Chocolate and cocoa. ? Peppermint and mint flavorings. ? Garlic and onions. ? Horseradish. ? Spicy and  acidic foods, including peppers, chili powder, curry powder, vinegar, hot sauces, and barbecue sauce. ? Citrus fruit juices and citrus fruits, such as oranges, lemons, and limes. ? Tomato-based foods, such as red sauce, chili, salsa, and pizza with red sauce. ? Fried and fatty foods, such as donuts, french fries, potato chips, and high-fat dressings. ? High-fat meats, such as hot dogs and fatty cuts of red and white meats, such as rib eye steak, sausage, ham, and bacon. ? High-fat dairy items, such as whole milk, butter, and cream cheese.  Eat small, frequent meals instead of large meals.  Avoid drinking large amounts of liquid with your meals.  Avoid eating meals during the 2-3 hours before bedtime.  Avoid lying down right after you eat.  Do not exercise right after you eat. Lifestyle   Do not use any products that contain nicotine or tobacco, such as cigarettes, e-cigarettes, and chewing tobacco. If you need help quitting, ask your health care provider.  Try to reduce your stress by using methods such as yoga or meditation. If you need help reducing stress, ask your health care provider.  If you are overweight, reduce your weight to an amount that is healthy for you. Ask your health care provider for guidance about a safe weight loss goal. General instructions  Pay attention to any changes in your symptoms.  Take over-the-counter and prescription medicines only as told by your health care provider. Do not take aspirin, ibuprofen, or other NSAIDs unless your health care provider told you to do so.  Wear loose-fitting clothing. Do not wear anything tight around your waist that causes pressure on your abdomen.  Raise (elevate) the head of your bed about 6 inches (15 cm).  Avoid bending over if this makes your symptoms worse.  Keep all follow-up visits as told by your health care provider. This is important. Contact a health care provider if:  You have: ? New  symptoms. ? Unexplained weight loss. ? Difficulty swallowing or it hurts to swallow. ? Wheezing or a persistent cough. ? A hoarse voice.  Your symptoms do not improve with treatment. Get help right away if you:  Have pain in your arms, neck, jaw, teeth, or back.  Feel sweaty, dizzy, or light-headed.  Have chest pain or shortness of breath.  Vomit and your vomit looks like blood or coffee grounds.  Faint.  Have stool that is bloody or black.  Cannot swallow, drink, or eat. Summary  Gastroesophageal reflux happens when acid from the stomach flows up into the esophagus. GERD is a disease in which the reflux happens often, causes frequent or severe symptoms, or causes problems such as damage to the esophagus.  Treatment for this condition may vary depending on how severe your symptoms are. Your health care provider may recommend diet and lifestyle changes, medicine, or surgery.  Contact a health care provider if you have new or worsening symptoms.  Take over-the-counter and prescription medicines only as told by your health care provider. Do not take aspirin, ibuprofen, or other NSAIDs unless your health care provider told you to do so.  Keep all follow-up visits as told by your health care provider. This is important. This information is  not intended to replace advice given to you by your health care provider. Make sure you discuss any questions you have with your health care provider. Document Revised: 07/21/2017 Document Reviewed: 07/21/2017 Elsevier Patient Education  2020 [place] for Gastroesophageal Reflux Disease, Adult When you have gastroesophageal reflux disease (GERD), the foods you eat and your eating habits are very important. Choosing the right foods can help ease the discomfort of GERD. Consider working with a diet and nutrition specialist (dietitian) to help you make healthy food choices. What general guidelines should I follow?  Eating  plan  Choose healthy foods low in fat, such as fruits, vegetables, whole grains, low-fat dairy products, and lean meat, fish, and poultry.  Eat frequent, small meals instead of three large meals each day. Eat your meals slowly, in a relaxed setting. Avoid bending over or lying down until 2-3 hours after eating.  Limit high-fat foods such as fatty meats or fried foods.  Limit your intake of oils, butter, and shortening to less than 8 teaspoons each day.  Avoid the following: ? Foods that cause symptoms. These may be different for different people. Keep a food diary to keep track of foods that cause symptoms. ? Alcohol. ? Drinking large amounts of liquid with meals. ? Eating meals during the 2-3 hours before bed.  Cook foods using methods other than frying. This may include baking, grilling, or broiling. Lifestyle  Maintain a healthy weight. Ask your health care provider what weight is healthy for you. If you need to lose weight, work with your health care provider to do so safely.  Exercise for at least 30 minutes on 5 or more days each week, or as told by your health care provider.  Avoid wearing clothes that fit tightly around your waist and chest.  Do not use any products that contain nicotine or tobacco, such as cigarettes and e-cigarettes. If you need help quitting, ask your health care provider.  Sleep with the head of your bed raised. Use a wedge under the mattress or blocks under the bed frame to raise the head of the bed. What foods are not recommended? The items listed may not be a complete list. Talk with your dietitian about what dietary choices are best for you. Grains Pastries or quick breads with added fat. [place] toast. Vegetables Deep fried vegetables. [place] fries. Any vegetables prepared with added fat. Any vegetables that cause symptoms. For some people this may include tomatoes and tomato products, chili peppers, onions and garlic, and horseradish. Fruits Any  fruits prepared with added fat. Any fruits that cause symptoms. For some people this may include citrus fruits, such as oranges, grapefruit, pineapple, and lemons. Meats and other protein foods High-fat meats, such as fatty beef or pork, hot dogs, ribs, ham, sausage, salami and bacon. Fried meat or protein, including fried fish and fried chicken. Nuts and nut butters. Dairy Whole milk and chocolate milk. Sour cream. Cream. Ice cream. Cream cheese. Milk shakes. Beverages Coffee and tea, with or without caffeine. Carbonated beverages. Sodas. Energy drinks. Fruit juice made with acidic fruits (such as orange or grapefruit). Tomato juice. Alcoholic drinks. Fats and oils Butter. Margarine. Shortening. Ghee. Sweets and desserts Chocolate and cocoa. Donuts. Seasoning and other foods Pepper. Peppermint and spearmint. Any condiments, herbs, or seasonings that cause symptoms. For some people, this may include curry, hot sauce, or vinegar-based salad dressings. Summary  When you have gastroesophageal reflux disease (GERD), food and lifestyle choices are very  important to help ease the discomfort of GERD.  Eat frequent, small meals instead of three large meals each day. Eat your meals slowly, in a relaxed setting. Avoid bending over or lying down until 2-3 hours after eating.  Limit high-fat foods such as fatty meat or fried foods. This information is not intended to replace advice given to you by your health care provider. Make sure you discuss any questions you have with your health care provider. Document Revised: 05/05/2018 Document Reviewed: 01/14/2016 Elsevier Patient Education  [place].

## 2019-03-29 NOTE — Progress Notes (Signed)
Primary Care Physician:  Alycia Rossetti, MD  Primary Gastroenterologist:  Garfield Cornea, MD   Chief Complaint  Patient presents with  . Gastroesophageal Reflux    issues since teens. Reports had CP few weeks ago, went to ER. Better now. change eating habits and increased pantoprazole to BID    HPI:  Bill Johnson is a 64 y.o. male here for further evaluation of chronic GERD.  Patient is hard of hearing.  Patient was seen in the ED on February 20 with chest pain.  Chest x-ray showed no acute findings.  Kept for observation.  Troponin I x3 were unremarkable. Covid negative.  Long history of GERD.  Has had it since he was a teenager.  On multiple over-the-counter agents throughout the years.  He was in a study in 2007 regarding reflux.  Initial EGD in June 2007 showed reflux esophagitis.  Final EGD in August 2007 with no evidence of persisting reflux esophagitis, he had a small hiatal hernia.  Really was not on medication chronically.   States he was put on pantoprazole 40 mg daily by his PCP "a while back".  He was having a lot of gas, bloating, pain behind the left breast, heartburn.  Medication seemed to help somewhat but symptoms not completely resolved.  Seen in the emergency department last month for worsening symptoms along with substernal chest pain associated with shortness of breath.  Reports cardiac work-up unremarkable.  He has been under a lot of stress, daughter and her family had Covid, brother has cancer.  He feels quite isolated in his phone.  He has known visitors, no phone, no TV.  He communicates via phone through his niece Rynell Carner, who he is requesting contact with any pertinent information and she will come by and give him information correctly.  We can also send mail needed.  He notes prior to his recent hospitalization, he was not taking care of himself.  He was not eating, consuming beer only. "Power drinking".  Having a lot of bloating, gas, malodorous gas.  When  he lays down he wakes up choking.  Since he was in the hospital has been trying to eat right.  Cutting back on smoking and alcohol consumption.  Waiting at least 2 hours between eating and laying down.  He is feeling somewhat better.  His PCP increase pantoprazole to twice daily this week.  He denies dysphagia.  Bowel movements regular.  No melena or rectal bleeding.   Also notes that he stopped taking Aleve a few weeks back.  Denies any aspirin powders.  Continues to have pain especially in the epigastrium/left upper quadrant which has been going on for a while.  Feels bloated and gassy.  Never had a colonoscopy and states he does not want to have either.  Current Outpatient Medications  Medication Sig Dispense Refill  . acetaminophen (TYLENOL) 500 MG tablet Take 500 mg by mouth every 6 (six) hours as needed.    Marland Kitchen albuterol (PROVENTIL HFA;VENTOLIN HFA) 108 (90 Base) MCG/ACT inhaler Inhale 2 puffs into the lungs every 6 (six) hours as needed for wheezing or shortness of breath. 1 Inhaler 2  . ALPRAZolam (XANAX) 0.5 MG tablet Take 1 tablet (0.5 mg total) by mouth at bedtime as needed for anxiety. 30 tablet 3  . amLODipine (NORVASC) 5 MG tablet Take 1 tablet (5 mg total) by mouth daily. 90 tablet 3  . aspirin 81 MG tablet Take 81 mg by mouth daily.    Marland Kitchen atenolol (  TENORMIN) 50 MG tablet Take 1 tablet (50 mg total) by mouth daily. 90 tablet 3  . atorvastatin (LIPITOR) 40 MG tablet Take 1 tablet (40 mg total) by mouth daily at 6 PM. Take 1 tablet at bedtime for cholesterol 30 tablet 3  . Cyanocobalamin (B-12 PO) Take 1 tablet by mouth daily.     . meclizine (ANTIVERT) 25 MG tablet TAKE 1 TABLET BY MOUTH THREE TIMES DAILY AS NEEDED FOR DIZZINESS 90 tablet 2  . Menthol-Camphor (TIGER BALM ARTHRITIS RUB) 11-11 % CREA Apply topically.    . pantoprazole (PROTONIX) 40 MG tablet Take 1 tablet (40 mg total) by mouth 2 (two) times daily. 60 tablet 6  . umeclidinium-vilanterol (ANORO ELLIPTA) 62.5-25 MCG/INH  AEPB Inhale 1 puff into the lungs daily. 1 each 11   No current facility-administered medications for this visit.    Allergies as of 03/29/2019  . (No Known Allergies)    Past Medical History:  Diagnosis Date  . Allergy    seasonal  . Anxiety   . Depression   . GERD (gastroesophageal reflux disease)   . Hypertension   . OA (osteoarthritis) of knee   . Rotator cuff tear arthropathy of both shoulders    On DISABILITY, non operative case  . Shoulder pain   . Vertigo     Past Surgical History:  Procedure Laterality Date  . CARDIAC CATHETERIZATION  06/2005   no significant CAD  . ESOPHAGOGASTRODUODENOSCOPY  08/2005   Dr. Henrene Pastor: small hiatal hernia  . HERNIA REPAIR  1983  . ROTATOR CUFF REPAIR Bilateral   . TONSILLECTOMY  1977    Family History  Problem Relation Age of Onset  . Heart disease Mother   . Diabetes Mother   . Hypertension Mother   . Heart disease Father   . Hypertension Father   . Stroke Other   . Cancer Other   . Cancer Brother        prostate  . Colon cancer Neg Hx     Social History   Socioeconomic History  . Marital status: Single    Spouse name: Not on file  . Number of children: 2  . Years of education: 70  . Highest education level: Not on file  Occupational History  . Occupation: Film/video editor Designer, television/film set)  Tobacco Use  . Smoking status: Current Every Day Smoker    Packs/day: 1.00    Years: 43.00    Pack years: 43.00    Types: Cigarettes  . Smokeless tobacco: Never Used  Substance and Sexual Activity  . Alcohol use: Yes    Comment: Drinks 3-4 beers/day   . Drug use: No  . Sexual activity: Yes  Other Topics Concern  . Not on file  Social History Narrative   Lives at home with nephew and niece in-law   Caffeine use: Drinks coffee/soda every day   Social Determinants of Health   Financial Resource Strain:   . Difficulty of Paying Living Expenses: Not on file  Food Insecurity:   . Worried About Charity fundraiser in the Last  Year: Not on file  . Ran Out of Food in the Last Year: Not on file  Transportation Needs:   . Lack of Transportation (Medical): Not on file  . Lack of Transportation (Non-Medical): Not on file  Physical Activity:   . Days of Exercise per Week: Not on file  . Minutes of Exercise per Session: Not on file  Stress:   . Feeling of Stress :  Not on file  Social Connections:   . Frequency of Communication with Friends and Family: Not on file  . Frequency of Social Gatherings with Friends and Family: Not on file  . Attends Religious Services: Not on file  . Active Member of Clubs or Organizations: Not on file  . Attends Archivist Meetings: Not on file  . Marital Status: Not on file  Intimate Partner Violence:   . Fear of Current or Ex-Partner: Not on file  . Emotionally Abused: Not on file  . Physically Abused: Not on file  . Sexually Abused: Not on file      ROS:  General: Negative for anorexia, weight loss, fever, chills, fatigue, weakness. Eyes: Negative for vision changes.  ENT: Negative for hoarseness, difficulty swallowing , nasal congestion. CV: Negative for chest pain, angina, palpitations, positive dyspnea on exertion, peripheral edema.  Respiratory: Negative for dyspnea at rest, positive dyspnea on exertion, cough, sputum, wheezing.  GI: See history of present illness. GU:  Negative for dysuria, hematuria, urinary incontinence, urinary frequency, nocturnal urination.  MS: Positive for shoulder and neck pain   Derm: Negative for rash or itching.  Neuro: Negative for weakness, abnormal sensation, seizure, frequent headaches, memory loss, confusion.  Psych: Positive for anxiety, depression, negative suicidal ideation, negative hallucinations.  Endo: Negative for unusual weight change.  Heme: Negative for bruising or bleeding. Allergy: Negative for rash or hives.    Physical Examination:  BP 125/79   Pulse 61   Temp (!) 97.2 F (36.2 C) (Oral)   Ht 5\' 4"   (1.626 m)   Wt 176 lb 6.4 oz (80 kg)   BMI 30.28 kg/m    General: Well-nourished, well-developed in no acute distress.  Head: Normocephalic, atraumatic.   Eyes: Conjunctiva pink, no icterus. Mouth: masked Neck: Supple without thyromegaly, masses, or lymphadenopathy.  Lungs: Clear to auscultation bilaterally.  Heart: Regular rate and rhythm, no murmurs rubs or gallops.  Abdomen: Bowel sounds are normal, mild to moderate epigastric/left upper quadrant tenderness, nondistended, no hepatosplenomegaly or masses, no abdominal bruits or    hernia , no rebound or guarding.   Rectal: Not performed Extremities: No lower extremity edema. No clubbing or deformities.  Neuro: Alert and oriented x 4 , grossly normal neurologically.  Skin: Warm and dry, no rash or jaundice.   Psych: Alert and cooperative, normal mood and affect.  Labs: Lab Results  Component Value Date   CREATININE 0.78 03/18/2019   BUN 8 03/18/2019   NA 138 03/18/2019   K 3.5 03/18/2019   CL 104 03/18/2019   CO2 25 03/18/2019   Lab Results  Component Value Date   ALT 12 03/18/2019   AST 20 03/18/2019   ALKPHOS 45 03/18/2019   BILITOT 0.4 03/18/2019   Lab Results  Component Value Date   LIPASE 20 03/18/2019   Lab Results  Component Value Date   WBC 8.1 03/18/2019   HGB 13.5 03/18/2019   HCT 40.6 03/18/2019   MCV 99.5 03/18/2019   PLT 207 03/18/2019    No results found for: HGBA1C    Imaging Studies: DG Chest 2 View  Result Date: 03/18/2019 CLINICAL DATA:  Chest pain EXAM: CHEST - 2 VIEW COMPARISON:  Chest radiograph dated 05/19/2013 FINDINGS: The heart size and mediastinal contours are within normal limits. There is minimal left basilar atelectasis. The right lung is clear. There is no pleural effusion or pneumothorax. The visualized skeletal structures are unremarkable. IMPRESSION: No active cardiopulmonary disease. Electronically Signed  By: Zerita Boers M.D.   On: 03/18/2019 20:13   ECHOCARDIOGRAM  COMPLETE  Result Date: 03/19/2019    ECHOCARDIOGRAM REPORT   Patient Name:   Bill Johnson Date of Exam: 03/19/2019 Medical Rec #:  [id number]        Height:       64.0 in Accession #:    [id number]       Weight:       176.8 lb Date of Birth:  [birth year]        BSA:          1.857 m Patient Age:    65 years         BP:           156/85 mmHg Patient Gender: M                HR:           68 bpm. Exam Location:  [facility] Procedure: 2D Echo, Cardiac Doppler and Color Doppler Indications:    Chest Pain 786.50 / R07.9  History:        Patient has no prior history of Echocardiogram examinations.                 COPD; Risk Factors:Hypertension, Dyslipidemia and Current                 Smoker. GERD.  Sonographer:    Alvino Chapel RCS Referring Phys: [id number] [place]  1. Left ventricular ejection fraction, by estimation, is 55 to 60%. The left ventricle has normal function. The left ventricle has no regional wall motion abnormalities. Left ventricular diastolic parameters were normal.  2. Right ventricular systolic function is normal. The right ventricular size is normal. Tricuspid regurgitation signal is inadequate for assessing PA pressure.  3. Left atrial size was upper normal.  4. Right atrial size was upper normal.  5. The mitral valve is grossly normal. Trivial mitral valve regurgitation.  6. The aortic valve is tricuspid. Aortic valve regurgitation is mild.  7. The inferior vena cava is normal in size with greater than 50% respiratory variability, suggesting right atrial pressure of 3 mmHg. FINDINGS  Left Ventricle: Left ventricular ejection fraction, by estimation, is 55 to 60%. The left ventricle has normal function. The left ventricle has no regional wall motion abnormalities. The left ventricular internal cavity size was normal in size. There is  no left ventricular hypertrophy. Left ventricular diastolic parameters were normal. Right Ventricle: The right ventricular size is normal. No  increase in right ventricular wall thickness. Right ventricular systolic function is normal. Tricuspid regurgitation signal is inadequate for assessing PA pressure. Left Atrium: Left atrial size was upper normal. Right Atrium: Right atrial size was upper normal. Pericardium: There is no evidence of pericardial effusion. Mitral Valve: The mitral valve is grossly normal. Trivial mitral valve regurgitation. Tricuspid Valve: The tricuspid valve is grossly normal. Tricuspid valve regurgitation is trivial. Aortic Valve: The aortic valve is tricuspid. Aortic valve regurgitation is mild. Aortic regurgitation PHT measures 751 msec. Mild aortic valve annular calcification. Pulmonic Valve: The pulmonic valve was not well visualized. Pulmonic valve regurgitation is trivial. Aorta: The aortic root is normal in size and structure. Venous: The inferior vena cava is normal in size with greater than 50% respiratory variability, suggesting right atrial pressure of 3 mmHg. IAS/Shunts: No atrial level shunt detected by color flow Doppler.  LEFT VENTRICLE PLAX 2D LVIDd:  4.84 cm      Diastology LVIDs:         3.15 cm      LV e' lateral:   10.20 cm/s LV PW:         1.02 cm      LV E/e' lateral: 7.8 LV IVS:        0.97 cm      LV e' medial:    7.40 cm/s LVOT diam:     2.00 cm      LV E/e' medial:  10.7 LV SV:         86.71 ml LV SV Index:   46.70 LVOT Area:     3.14 cm  LV Volumes (MOD) LV vol d, MOD A2C: 78.5 ml LV vol d, MOD A4C: 128.0 ml LV vol s, MOD A2C: 36.6 ml LV vol s, MOD A4C: 42.9 ml LV SV MOD A2C:     41.9 ml LV SV MOD A4C:     128.0 ml LV SV MOD BP:      59.6 ml RIGHT VENTRICLE RV S prime:     13.60 cm/s TAPSE (M-mode): 2.3 cm LEFT ATRIUM             Index       RIGHT ATRIUM           Index LA diam:        3.50 cm 1.89 cm/m  RA Area:     21.40 cm LA Vol (A2C):   58.6 ml 31.56 ml/m RA Volume:   63.30 ml  34.10 ml/m LA Vol (A4C):   55.3 ml 29.79 ml/m LA Biplane Vol: 61.8 ml 33.29 ml/m  AORTIC VALVE LVOT Vmax:    127.00 cm/s LVOT Vmean:  74.200 cm/s LVOT VTI:    0.276 m AI PHT:      751 msec  AORTA Ao Root diam: 3.60 cm MITRAL VALVE MV Area (PHT): 4.39 cm    SHUNTS MV Decel Time: 173 msec    Systemic VTI:  0.28 m MV E velocity: 79.30 cm/s  Systemic Diam: 2.00 cm MV A velocity: 71.90 cm/s MV E/A ratio:  1.10 Rozann Lesches MD Electronically signed by Rozann Lesches MD Signature Date/Time: 03/19/2019/1:00:43 PM    Final

## 2019-04-14 ENCOUNTER — Ambulatory Visit (HOSPITAL_COMMUNITY)
Admission: RE | Admit: 2019-04-14 | Discharge: 2019-04-14 | Disposition: A | Discharge status: Home / Self Care | Payer: Medicare Other | Source: Ambulatory Visit | Attending: Gastroenterology | Admitting: Gastroenterology

## 2019-04-14 ENCOUNTER — Telehealth: Payer: Self-pay

## 2019-04-14 DIAGNOSIS — R140 Abdominal distension (gaseous): Secondary | ICD-10-CM | POA: Diagnosis not present

## 2019-04-14 DIAGNOSIS — R10812 Left upper quadrant abdominal tenderness: Secondary | ICD-10-CM

## 2019-04-14 DIAGNOSIS — R1012 Left upper quadrant pain: Secondary | ICD-10-CM | POA: Diagnosis not present

## 2019-04-14 MED ORDER — IOHEXOL 300 MG/ML  SOLN
100.0000 mL | Freq: Once | INTRAMUSCULAR | Status: AC | PRN
  Administered 2019-04-14: 100 mL via INTRAVENOUS

## 2019-04-14 NOTE — Telephone Encounter (Signed)
Diane from [facility] with CT results. Results in system. IMPRESSION: 1. Eccentric nodular focus of wall thickening in the right rectal wall. Recommend correlation with colonoscopy to exclude rectal neoplasm, if not recently performed. 2. No adenopathy or findings of abdominopelvic metastatic disease. 3. Scattered small right lung base pulmonary nodules, largest 4 mm. If the rectal finding proves to represent a neoplasm, follow-up chest CT in 3 months would be warranted. Otherwise follow-up chest CT in 12 months could be considered. 4. Otherwise no acute abnormality with no findings to explain the chronic left upper quadrant abdominal pain. Normal size spleen. 5. Aortic Atherosclerosis (ICD10-I70.0).

## 2019-04-17 ENCOUNTER — Telehealth: Payer: Self-pay | Admitting: Family Medicine

## 2019-04-17 MED ORDER — ATORVASTATIN CALCIUM 40 MG PO TABS: 40.0000 mg | ORAL_TABLET | Freq: Every day | ORAL | 3 refills | Status: DC

## 2019-04-17 NOTE — Telephone Encounter (Signed)
Prescription sent to pharmacy.

## 2019-04-17 NOTE — Telephone Encounter (Signed)
Patient needs refill on atorvastatin  [facility] [place]

## 2019-04-24 ENCOUNTER — Other Ambulatory Visit: Payer: Self-pay

## 2019-04-24 ENCOUNTER — Other Ambulatory Visit: Payer: Self-pay | Admitting: *Deleted

## 2019-04-24 DIAGNOSIS — R9389 Abnormal findings on diagnostic imaging of other specified body structures: Secondary | ICD-10-CM

## 2019-04-24 DIAGNOSIS — N329 Bladder disorder, unspecified: Secondary | ICD-10-CM

## 2019-04-24 NOTE — Telephone Encounter (Signed)
See result note.  

## 2019-04-26 NOTE — Patient Instructions (Signed)
Bill Johnson  04/26/2019     @PREFPERIOPPHARMACY @   Your procedure is scheduled on  05/01/2019 .  Report to [facility] at  [phone]  A.M.  Call this number if you have problems the morning of surgery:  [phone]   Remember:  Follow the diet and prep instructions given to you by Dr Roseanne Kaufman office.                     Take these medicines the morning of surgery with A SIP OF WATER  Atenolol, amlodipine, antivert(if needed), protonix.    Do not wear jewelry, make-up or nail polish.  Do not wear lotions, powders, or perfumes. Please wear deodorant and brush your teeth.  Do not shave 48 hours prior to surgery.  Men may shave face and neck.  Do not bring valuables to the hospital.  [facility] is not responsible for any belongings or valuables.  Contacts, dentures or bridgework may not be worn into surgery.  Leave your suitcase in the car.  After surgery it may be brought to your room.  For patients admitted to the hospital, discharge time will be determined by your treatment team.  Patients discharged the day of surgery will not be allowed to drive home.   Name and phone number of your driver:   family Special instructions:  DO NOT smoke the morning of your procedure.  Please read over the following fact sheets that you were given. Anesthesia Post-op Instructions and Care and Recovery After Surgery       Colonoscopy, Adult, Care After This sheet gives you information about how to care for yourself after your procedure. Your health care provider may also give you more specific instructions. If you have problems or questions, contact your health care provider. What can I expect after the procedure? After the procedure, it is common to have:  A small amount of blood in your stool for 24 hours after the procedure.  Some gas.  Mild cramping or bloating of your abdomen. Follow these instructions at home: Eating and drinking   Drink enough fluid to keep your urine  pale yellow.  Follow instructions from your health care provider about eating or drinking restrictions.  Resume your normal diet as instructed by your health care provider. Avoid heavy or fried foods that are hard to digest. Activity  Rest as told by your health care provider.  Avoid sitting for a long time without moving. Get up to take short walks every 1-2 hours. This is important to improve blood flow and breathing. Ask for help if you feel weak or unsteady.  Return to your normal activities as told by your health care provider. Ask your health care provider what activities are safe for you. Managing cramping and bloating   Try walking around when you have cramps or feel bloated.  Apply heat to your abdomen as told by your health care provider. Use the heat source that your health care provider recommends, such as a moist heat pack or a heating pad. ? Place a towel between your skin and the heat source. ? Leave the heat on for 20-30 minutes. ? Remove the heat if your skin turns bright red. This is especially important if you are unable to feel pain, heat, or cold. You may have a greater risk of getting burned. General instructions  For the first 24 hours after the procedure: ? Do not drive or use machinery. ?  Do not sign important documents. ? Do not drink alcohol. ? Do your regular daily activities at a slower pace than normal. ? Eat soft foods that are easy to digest.  Take over-the-counter and prescription medicines only as told by your health care provider.  Keep all follow-up visits as told by your health care provider. This is important. Contact a health care provider if:  You have blood in your stool 2-3 days after the procedure. Get help right away if you have:  More than a small spotting of blood in your stool.  Large blood clots in your stool.  Swelling of your abdomen.  Nausea or vomiting.  A fever.  Increasing pain in your abdomen that is not relieved  with medicine. Summary  After the procedure, it is common to have a small amount of blood in your stool. You may also have mild cramping and bloating of your abdomen.  For the first 24 hours after the procedure, do not drive or use machinery, sign important documents, or drink alcohol.  Get help right away if you have a lot of blood in your stool, nausea or vomiting, a fever, or increased pain in your abdomen. This information is not intended to replace advice given to you by your health care provider. Make sure you discuss any questions you have with your health care provider. Document Revised: 08/08/2018 Document Reviewed: 08/08/2018 Elsevier Patient Education  [place] After These instructions provide you with information about caring for yourself after your procedure. Your health care provider may also give you more specific instructions. Your treatment has been planned according to current medical practices, but problems sometimes occur. Call your health care provider if you have any problems or questions after your procedure. What can I expect after the procedure? After your procedure, you may:  Feel sleepy for several hours.  Feel clumsy and have poor balance for several hours.  Feel forgetful about what happened after the procedure.  Have poor judgment for several hours.  Feel nauseous or vomit.  Have a sore throat if you had a breathing tube during the procedure. Follow these instructions at home: For at least 24 hours after the procedure:      Have a responsible adult stay with you. It is important to have someone help care for you until you are awake and alert.  Rest as needed.  Do not: ? Participate in activities in which you could fall or become injured. ? Drive. ? Use heavy machinery. ? Drink alcohol. ? Take sleeping pills or medicines that cause drowsiness. ? Make important decisions or sign legal documents. ? Take  care of children on your own. Eating and drinking  Follow the diet that is recommended by your health care provider.  If you vomit, drink water, juice, or soup when you can drink without vomiting.  Make sure you have little or no nausea before eating solid foods. General instructions  Take over-the-counter and prescription medicines only as told by your health care provider.  If you have sleep apnea, surgery and certain medicines can increase your risk for breathing problems. Follow instructions from your health care provider about wearing your sleep device: ? Anytime you are sleeping, including during daytime naps. ? While taking prescription pain medicines, sleeping medicines, or medicines that make you drowsy.  If you smoke, do not smoke without supervision.  Keep all follow-up visits as told by your health care provider. This is important. Contact a health  care provider if:  You keep feeling nauseous or you keep vomiting.  You feel light-headed.  You develop a rash.  You have a fever. Get help right away if:  You have trouble breathing. Summary  For several hours after your procedure, you may feel sleepy and have poor judgment.  Have a responsible adult stay with you for at least 24 hours or until you are awake and alert. This information is not intended to replace advice given to you by your health care provider. Make sure you discuss any questions you have with your health care provider. Document Revised: 04/12/2017 Document Reviewed: 05/05/2015 Elsevier Patient Education  [place].

## 2019-04-27 ENCOUNTER — Encounter (HOSPITAL_COMMUNITY): Payer: Self-pay

## 2019-04-27 ENCOUNTER — Other Ambulatory Visit: Payer: Self-pay

## 2019-04-27 ENCOUNTER — Encounter (HOSPITAL_COMMUNITY)
Admission: RE | Admit: 2019-04-27 | Discharge: 2019-04-27 | Disposition: A | Discharge status: Home / Self Care | Payer: Medicare Other | Source: Ambulatory Visit | Attending: Internal Medicine | Admitting: Internal Medicine

## 2019-04-28 ENCOUNTER — Other Ambulatory Visit (HOSPITAL_COMMUNITY)
Admission: RE | Admit: 2019-04-28 | Discharge: 2019-04-28 | Disposition: A | Discharge status: Home / Self Care | Payer: Medicare Other | Source: Ambulatory Visit | Attending: Internal Medicine | Admitting: Internal Medicine

## 2019-04-28 DIAGNOSIS — Z20822 Contact with and (suspected) exposure to covid-19: Secondary | ICD-10-CM | POA: Diagnosis not present

## 2019-04-28 DIAGNOSIS — Z01812 Encounter for preprocedural laboratory examination: Secondary | ICD-10-CM | POA: Insufficient documentation

## 2019-04-29 LAB — SARS CORONAVIRUS 2 (TAT 6-24 HRS): SARS Coronavirus 2: NEGATIVE

## 2019-05-01 ENCOUNTER — Ambulatory Visit (HOSPITAL_COMMUNITY): Admit: 2019-05-01 | Payer: Medicare Other | Admitting: Internal Medicine

## 2019-05-01 ENCOUNTER — Ambulatory Visit (HOSPITAL_COMMUNITY): Admission: RE | Admit: 2019-05-01 | Payer: Medicare Other | Source: Ambulatory Visit | Admitting: Internal Medicine

## 2019-05-01 ENCOUNTER — Ambulatory Visit (HOSPITAL_COMMUNITY)
Admission: AD | Admit: 2019-05-01 | Discharge: 2019-05-01 | Disposition: A | Discharge status: Home / Self Care | Payer: Medicare Other | Source: Ambulatory Visit | Attending: Internal Medicine | Admitting: Internal Medicine

## 2019-05-01 ENCOUNTER — Encounter (HOSPITAL_COMMUNITY)
Admission: AD | Disposition: A | Discharge status: Home / Self Care | Payer: Self-pay | Source: Ambulatory Visit | Attending: Internal Medicine

## 2019-05-01 ENCOUNTER — Ambulatory Visit (HOSPITAL_COMMUNITY): Payer: Medicare Other | Admitting: Anesthesiology

## 2019-05-01 ENCOUNTER — Encounter (HOSPITAL_COMMUNITY): Payer: Self-pay | Admitting: Internal Medicine

## 2019-05-01 DIAGNOSIS — F419 Anxiety disorder, unspecified: Secondary | ICD-10-CM | POA: Insufficient documentation

## 2019-05-01 DIAGNOSIS — D125 Benign neoplasm of sigmoid colon: Secondary | ICD-10-CM | POA: Insufficient documentation

## 2019-05-01 DIAGNOSIS — K649 Unspecified hemorrhoids: Secondary | ICD-10-CM | POA: Insufficient documentation

## 2019-05-01 DIAGNOSIS — J449 Chronic obstructive pulmonary disease, unspecified: Secondary | ICD-10-CM | POA: Insufficient documentation

## 2019-05-01 DIAGNOSIS — I10 Essential (primary) hypertension: Secondary | ICD-10-CM | POA: Diagnosis not present

## 2019-05-01 DIAGNOSIS — D128 Benign neoplasm of rectum: Secondary | ICD-10-CM | POA: Diagnosis not present

## 2019-05-01 DIAGNOSIS — K635 Polyp of colon: Secondary | ICD-10-CM | POA: Diagnosis not present

## 2019-05-01 DIAGNOSIS — Z7982 Long term (current) use of aspirin: Secondary | ICD-10-CM | POA: Insufficient documentation

## 2019-05-01 DIAGNOSIS — Z8249 Family history of ischemic heart disease and other diseases of the circulatory system: Secondary | ICD-10-CM | POA: Insufficient documentation

## 2019-05-01 DIAGNOSIS — D120 Benign neoplasm of cecum: Secondary | ICD-10-CM | POA: Insufficient documentation

## 2019-05-01 DIAGNOSIS — D123 Benign neoplasm of transverse colon: Secondary | ICD-10-CM | POA: Insufficient documentation

## 2019-05-01 DIAGNOSIS — K621 Rectal polyp: Secondary | ICD-10-CM

## 2019-05-01 DIAGNOSIS — F17210 Nicotine dependence, cigarettes, uncomplicated: Secondary | ICD-10-CM | POA: Insufficient documentation

## 2019-05-01 DIAGNOSIS — K642 Third degree hemorrhoids: Secondary | ICD-10-CM | POA: Diagnosis not present

## 2019-05-01 DIAGNOSIS — Z79899 Other long term (current) drug therapy: Secondary | ICD-10-CM | POA: Insufficient documentation

## 2019-05-01 DIAGNOSIS — M1710 Unilateral primary osteoarthritis, unspecified knee: Secondary | ICD-10-CM | POA: Diagnosis not present

## 2019-05-01 DIAGNOSIS — K219 Gastro-esophageal reflux disease without esophagitis: Secondary | ICD-10-CM | POA: Insufficient documentation

## 2019-05-01 DIAGNOSIS — K5730 Diverticulosis of large intestine without perforation or abscess without bleeding: Secondary | ICD-10-CM | POA: Insufficient documentation

## 2019-05-01 DIAGNOSIS — R933 Abnormal findings on diagnostic imaging of other parts of digestive tract: Secondary | ICD-10-CM | POA: Diagnosis not present

## 2019-05-01 HISTORY — PX: COLONOSCOPY WITH PROPOFOL: SHX5780

## 2019-05-01 HISTORY — PX: POLYPECTOMY: SHX5525

## 2019-05-01 SURGERY — COLONOSCOPY WITH PROPOFOL
Anesthesia: General

## 2019-05-01 MED ORDER — LACTATED RINGERS IV SOLN: Freq: Once | INTRAVENOUS | Status: DC

## 2019-05-01 MED ORDER — KETAMINE HCL 10 MG/ML IJ SOLN
INTRAMUSCULAR | Status: DC | PRN
  Administered 2019-05-01: 20 mg via INTRAVENOUS

## 2019-05-01 MED ORDER — PROPOFOL 10 MG/ML IV BOLUS
INTRAVENOUS | Status: DC | PRN
  Administered 2019-05-01: 30 mg via INTRAVENOUS
  Administered 2019-05-01: 40 mg via INTRAVENOUS
  Administered 2019-05-01: 30 mg via INTRAVENOUS

## 2019-05-01 MED ORDER — CHLORHEXIDINE GLUCONATE CLOTH 2 % EX PADS: 6.0000 | MEDICATED_PAD | Freq: Once | CUTANEOUS | Status: DC

## 2019-05-01 MED ORDER — MIDAZOLAM HCL 5 MG/5ML IJ SOLN
INTRAMUSCULAR | Status: DC | PRN
  Administered 2019-05-01: 2 mg via INTRAVENOUS

## 2019-05-01 MED ORDER — KETAMINE HCL 50 MG/5ML IJ SOSY
PREFILLED_SYRINGE | INTRAMUSCULAR | Status: AC
  Filled 2019-05-01: qty 5

## 2019-05-01 MED ORDER — STERILE WATER FOR IRRIGATION IR SOLN
Status: DC | PRN
  Administered 2019-05-01: 100 mL

## 2019-05-01 MED ORDER — PROPOFOL 500 MG/50ML IV EMUL
INTRAVENOUS | Status: DC | PRN
  Administered 2019-05-01: 150 ug/kg/min via INTRAVENOUS

## 2019-05-01 MED ORDER — PROPOFOL 10 MG/ML IV BOLUS
INTRAVENOUS | Status: AC
  Filled 2019-05-01: qty 60

## 2019-05-01 MED ORDER — LACTATED RINGERS IV SOLN: INTRAVENOUS | Status: DC | PRN

## 2019-05-01 MED ORDER — MIDAZOLAM HCL 2 MG/2ML IJ SOLN
INTRAMUSCULAR | Status: AC
  Filled 2019-05-01: qty 2

## 2019-05-01 NOTE — H&P (Signed)
@LOGO @   Primary Care Physician:  Alycia Rossetti, MD Primary Gastroenterologist:  Dr. Gala Romney  Pre-Procedure History & Physical: HPI:  Bill Johnson is a 64 y.o. male here for diagnostic colonoscopy.  Rectal mass on CT recently.  No prior colonoscopy on record.  Past Medical History:  Diagnosis Date  . Allergy    seasonal  . Anxiety   . Depression   . GERD (gastroesophageal reflux disease)   . Hypertension   . OA (osteoarthritis) of knee   . Rotator cuff tear arthropathy of both shoulders    On DISABILITY, non operative case  . Shoulder pain   . Vertigo     Past Surgical History:  Procedure Laterality Date  . CARDIAC CATHETERIZATION  06/2005   no significant CAD  . ESOPHAGOGASTRODUODENOSCOPY  08/2005   Dr. Henrene Pastor: small hiatal hernia  . HERNIA REPAIR  1983  . ROTATOR CUFF REPAIR Bilateral   . TONSILLECTOMY  1977    Prior to Admission medications   Medication Sig Start Date End Date Taking? Authorizing Provider  albuterol (PROVENTIL HFA;VENTOLIN HFA) 108 (90 Base) MCG/ACT inhaler Inhale 2 puffs into the lungs every 6 (six) hours as needed for wheezing or shortness of breath. 10/13/17  Yes Orlena Sheldon, PA-C  ALPRAZolam Duanne Moron) 0.5 MG tablet Take 1 tablet (0.5 mg total) by mouth at bedtime as needed for anxiety. Patient taking differently: Take 0.5 mg by mouth at bedtime.  01/02/19  Yes [place], Modena Nunnery, MD  amLODipine (NORVASC) 5 MG tablet Take 1 tablet (5 mg total) by mouth daily. 08/29/18 08/29/19 Yes [place], Modena Nunnery, MD  atenolol (TENORMIN) 50 MG tablet Take 1 tablet (50 mg total) by mouth daily. 08/29/18  Yes [place], Modena Nunnery, MD  atorvastatin (LIPITOR) 40 MG tablet Take 1 tablet (40 mg total) by mouth daily at 6 PM. Take 1 tablet at bedtime for cholesterol Patient taking differently: Take 40 mg by mouth daily.  04/17/19  Yes [place], Modena Nunnery, MD  [facility] VITAMIN B-12 2000 MCG TBCR Take 2,000 mcg by mouth daily.   Yes [provider]  meclizine (ANTIVERT) 25 MG  tablet TAKE 1 TABLET BY MOUTH THREE TIMES DAILY AS NEEDED FOR DIZZINESS Patient taking differently: Take 25 mg by mouth 3 (three) times daily as needed (dizziness/vertigo). TAKE 1 TABLET BY MOUTH THREE TIMES DAILY AS NEEDED FOR DIZZINESS 02/14/19  Yes [place], Modena Nunnery, MD  pantoprazole (PROTONIX) 40 MG tablet Take 1 tablet (40 mg total) by mouth 2 (two) times daily. Patient taking differently: Take 40 mg by mouth daily.  03/27/19  Yes [place], Modena Nunnery, MD  umeclidinium-vilanterol [facility] ELLIPTA) 62.5-25 MCG/INH AEPB Inhale 1 puff into the lungs daily. 08/29/18  Yes [place], Modena Nunnery, MD  acetaminophen (TYLENOL) 500 MG tablet Take 500 mg by mouth every 6 (six) hours as needed.    [provider]  aspirin 81 MG tablet Take 81 mg by mouth daily.    [provider]    Allergies as of 03/29/2019  . (No Known Allergies)    Family History  Problem Relation Age of Onset  . Heart disease Mother   . Diabetes Mother   . Hypertension Mother   . Heart disease Father   . Hypertension Father   . Stroke Other   . Cancer Other   . Cancer Brother        prostate  . Colon cancer Neg Hx     Social History   Socioeconomic History  . Marital  status: Single    Spouse name: Not on file  . Number of children: 2  . Years of education: 44  . Highest education level: Not on file  Occupational History  . Occupation: Film/video editor Designer, television/film set)  Tobacco Use  . Smoking status: Current Every Day Smoker    Packs/day: 1.00    Years: 43.00    Pack years: 43.00    Types: Cigarettes  . Smokeless tobacco: Never Used  Substance and Sexual Activity  . Alcohol use: Yes    Comment: Drinks 3-4 beers/day   . Drug use: No  . Sexual activity: Yes  Other Topics Concern  . Not on file  Social History Narrative   Lives at home with nephew and niece in-law   Caffeine use: Drinks coffee/soda every day   Social Determinants of Health   Financial Resource Strain:   . Difficulty of Paying Living  Expenses:   Food Insecurity:   . Worried About Charity fundraiser in the Last Year:   . Arboriculturist in the Last Year:   Transportation Needs:   . Film/video editor (Medical):   Marland Kitchen Lack of Transportation (Non-Medical):   Physical Activity:   . Days of Exercise per Week:   . Minutes of Exercise per Session:   Stress:   . Feeling of Stress :   Social Connections:   . Frequency of Communication with Friends and Family:   . Frequency of Social Gatherings with Friends and Family:   . Attends Religious Services:   . Active Member of Clubs or Organizations:   . Attends Archivist Meetings:   Marland Kitchen Marital Status:   Intimate Partner Violence:   . Fear of Current or Ex-Partner:   . Emotionally Abused:   Marland Kitchen Physically Abused:   . Sexually Abused:     Review of Systems: See HPI, otherwise negative ROS  Physical Exam: BP 137/71   Pulse 60   Temp 98.1 F (36.7 C) (Oral)   Resp 20   Ht 5\' 4"  (1.626 m)   Wt 80.7 kg   SpO2 97%   BMI 30.55 kg/m  General:   Alert,  Well-developed, well-nourished, pleasant and cooperative in NAD Neck:  Supple; no masses or thyromegaly. No significant cervical adenopathy. Lungs:  Clear throughout to auscultation.   No wheezes, crackles, or rhonchi. No acute distress. Heart:  Regular rate and rhythm; no murmurs, clicks, rubs,  or gallops. Abdomen: Non-distended, normal bowel sounds.  Soft and nontender without appreciable mass or hepatosplenomegaly.  Pulses:  Normal pulses noted. Extremities:  Without clubbing or edema.  Impression/Plan: 64 year old gentleman with a rectal wall abnormality on CT suspicious for carcinoma.  Here for diagnostic colonoscopy per plan.  The risks, benefits, limitations, alternatives and imponderables have been reviewed with the patient. Questions have been answered. All parties are agreeable.      Notice: This dictation was prepared with Dragon dictation along with smaller phrase technology. Any  transcriptional errors that result from this process are unintentional and may not be corrected upon review.

## 2019-05-01 NOTE — Op Note (Signed)
[facility] Patient Name: Bill Johnson Procedure Date: 05/01/2019 7:01 AM MRN: [id number] Date of Birth: April 11, 1955 Attending MD: Norvel Richards , MD CSN: [id number] Age: 64 Admit Type: Outpatient Procedure:                Colonoscopy Indications:              Abnormal CT of the GI tract Providers:                Norvel Richards, MD, Rosina Lowenstein, RN, Raphael Gibney, Technician Referring MD:              Medicines:                Propofol per Anesthesia Complications:            No immediate complications. Estimated Blood Loss:     Estimated blood loss was minimal. Procedure:                Pre-Anesthesia Assessment:                           - Prior to the procedure, a History and Physical                            was performed, and patient medications and                            allergies were reviewed. The patient's tolerance of                            previous anesthesia was also reviewed. The risks                            and benefits of the procedure and the sedation                            options and risks were discussed with the patient.                            All questions were answered, and informed consent                            was obtained. Prior Anticoagulants: The patient has                            taken no previous anticoagulant or antiplatelet                            agents. ASA Grade Assessment: III - A patient with                            severe systemic disease. After reviewing the risks  and benefits, the patient was deemed in                            satisfactory condition to undergo the procedure.                           After obtaining informed consent, the colonoscope                            was passed under direct vision. Throughout the                            procedure, the patient's blood pressure, pulse, and                            oxygen  saturations were monitored continuously. The                            [id number]-HQ190L [id number] scope was introduced through                            the anus and advanced to the the cecum, identified                            by appendiceal orifice and ileocecal valve. The                            colonoscopy was performed without difficulty. The                            patient tolerated the procedure well. The quality                            of the bowel preparation was adequate. Scope In: 7:40:34 AM Scope Out: 8:19:34 AM Scope Withdrawal Time: 0 hours 36 minutes 35 seconds  Total Procedure Duration: 0 hours 39 minutes 0 seconds  Findings:      Hemorrhoids were found on perianal exam.      Three sessile polyps were found in the sigmoid colon and splenic       flexure. The polyps were 5 to 8 mm in size. These polyps were removed       with a cold snare. Resection and retrieval were complete. Estimated       blood loss was minimal.      A 12 mm polyp was found in the ileocecal valve. The polyp was sessile.       The polyp was removed with a hot snare. Resection and retrieval were       complete. Estimated blood loss: none.      A 30 mm polyp was found in the distal rectum. The polyp was sessile.       This rectal polyp was lifted nicely with 4.5 cc of Ele[place] dynamically.       Piecemeal polypectomy ensued. Resection and retrieval appeared to be       complete. Periphery polypectomy margin was ablated with the APC circular  probe rectal settings 20 J each. Multiple applications. 1 localized raw       area in the base of the crater was closed with a single 360 clip.       Estimated blood loss was minimal.      Scattered small-mouthed diverticula were found in the sigmoid colon and       descending colon.      The exam was otherwise without abnormality on direct and retroflexion       views. Impression:               - Hemorrhoids found on perianal exam. Grade 3.                            - Three 5 to 8 mm polyps in the sigmoid colon and                            at the splenic flexure, removed with a cold snare.                            Resected and retrieved.                           - One 12 mm polyp at the ileocecal valve, removed                            with a hot snare. Resected and retrieved.                           - One 30 mm polyp in the distal rectum, removed                            with a hot snare. Piecemeal resection. DC ablation.                            Clip placed. Please note this polyp was barely                            palpable on digital rectal examination it was soft.                            The distal extent of this lesion was approximately                            4 cm from the anal verge.                           - Diverticulosis in the sigmoid colon and in the                            descending colon.                           - The examination was otherwise normal on direct  and retroflexion views. Moderate Sedation:      Moderate (conscious) sedation was administered by the endoscopy nurse       and supervised by the endoscopist. The following parameters were       monitored: oxygen saturation, heart rate, blood pressure, respiratory       rate, EKG, adequacy of pulmonary ventilation, and response to care. Recommendation:           - Patient has a contact number available for                            emergencies. The signs and symptoms of potential                            delayed complications were discussed with the                            patient. Return to normal activities tomorrow.                            Written discharge instructions were provided to the                            patient.                           - Advance diet as tolerated.                           - Continue present medications.                           - Repeat colonoscopy in 6 months for  surveillance                            after piecemeal polypectomy. No MRI until clips                            gone.                           - Return to GI office in 6 months. Follow-up                            pathology. Procedure Code(s):        --- Professional ---                           [phone], Colonoscopy, flexible; with removal of                            tumor(s), polyp(s), or other lesion(s) by snare                            technique Diagnosis Code(s):        --- Professional ---  K63.5, Polyp of colon                           K62.1, Rectal polyp                           K64.9, Unspecified hemorrhoids                           K57.30, Diverticulosis of large intestine without                            perforation or abscess without bleeding                           R93.3, Abnormal findings on diagnostic imaging of                            other parts of digestive tract CPT copyright 2019 American Medical Association. All rights reserved. The codes documented in this report are preliminary and upon coder review may  be revised to meet current compliance requirements. Cristopher Estimable. Gianlucas Evenson, MD Norvel Richards, MD 05/01/2019 8:35:47 AM This report has been signed electronically. Number of Addenda: 0

## 2019-05-01 NOTE — Transfer of Care (Signed)
Immediate Anesthesia Transfer of Care Note  Patient: Bill Johnson  Procedure(s) Performed: COLONOSCOPY WITH PROPOFOL (N/A ) POLYPECTOMY  Patient Location: PACU  Anesthesia Type:General  Level of Consciousness: awake  Airway & Oxygen Therapy: Patient Spontanous Breathing  Post-op Assessment: Report given to RN  Post vital signs: Reviewed  Last Vitals:  Vitals Value Taken Time  BP    Temp    Pulse 65 05/01/19 0827  Resp 20 05/01/19 0827  SpO2 100 % 05/01/19 0827  Vitals shown include unvalidated device data.  Last Pain:  Vitals:   05/01/19 0822  TempSrc:   PainSc: 0-No pain      Patients Stated Pain Goal: 6 ([id number])  Complications: No apparent anesthesia complications

## 2019-05-01 NOTE — Anesthesia Preprocedure Evaluation (Addendum)
Anesthesia Evaluation  Patient identified by MRN, date of birth, ID band Patient awake    Reviewed: Allergy & Precautions, NPO status , Patient's Chart, lab work & pertinent test results, reviewed documented beta blocker date and time   History of Anesthesia Complications Negative for: history of anesthetic complications  Airway Mallampati: III  TM Distance: >3 FB Neck ROM: Full    Dental  (+) Loose, Dental Advisory Given, Missing,    Pulmonary shortness of breath and with exertion, COPD,  COPD inhaler, Current SmokerPatient did not abstain from smoking.,    Pulmonary exam normal breath sounds clear to auscultation       Cardiovascular Exercise Tolerance: Poor hypertension, Pt. on medications and Pt. on home beta blockers + DOE  Normal cardiovascular exam Rhythm:Regular Rate:Normal  1. Left ventricular ejection fraction, by estimation, is 55 to 60%. The  left ventricle has normal function. The left ventricle has no regional  wall motion abnormalities. Left ventricular diastolic parameters were  normal.  2. Right ventricular systolic function is normal. The right ventricular  size is normal. Tricuspid regurgitation signal is inadequate for assessing  PA pressure.  3. Left atrial size was upper normal.  4. Right atrial size was upper normal.  5. The mitral valve is grossly normal. Trivial mitral valve  regurgitation.  6. The aortic valve is tricuspid. Aortic valve regurgitation is mild.  7. The inferior vena cava is normal in size with greater than 50%  respiratory variability, suggesting right atrial pressure of 3 mmHg.  19-Mar-2019 08:31:53 [place] System-AP-300 ROUTINE RECORD Normal sinus rhythm Normal ECG No significant change since last tracing Confirmed by Cristopher Peru [phone]) on 03/19/2019 2:09:02 PM    Neuro/Psych PSYCHIATRIC DISORDERS Anxiety Depression    GI/Hepatic Neg liver ROS, GERD   Medicated and Controlled,  Endo/Other  negative endocrine ROS  Renal/GU negative Renal ROS     Musculoskeletal  (+) Arthritis , Osteoarthritis,    Abdominal   Peds  Hematology   Anesthesia Other Findings   Reproductive/Obstetrics                            Anesthesia Physical Anesthesia Plan  ASA: III  Anesthesia Plan: General   Post-op Pain Management:    Induction: Intravenous  PONV Risk Score and Plan: 1 and Treatment may vary due to age or medical condition  Airway Management Planned: Nasal Cannula and Natural Airway  Additional Equipment:   Intra-op Plan:   Post-operative Plan:   Informed Consent: I have reviewed the patients History and Physical, chart, labs and discussed the procedure including the risks, benefits and alternatives for the proposed anesthesia with the patient or authorized representative who has indicated his/her understanding and acceptance.     Dental advisory given  Plan Discussed with:   Anesthesia Plan Comments:         Anesthesia Quick Evaluation

## 2019-05-01 NOTE — Discharge Instructions (Signed)
Colonoscopy Discharge Instructions  Read the instructions outlined below and refer to this sheet in the next few weeks. These discharge instructions provide you with general information on caring for yourself after you leave the hospital. Your doctor may also give you specific instructions. While your treatment has been planned according to the most current medical practices available, unavoidable complications occasionally occur. If you have any problems or questions after discharge, call Dr. Gala Romney at [phone]. ACTIVITY  You may resume your regular activity, but move at a slower pace for the next 24 hours.   Take frequent rest periods for the next 24 hours.   Walking will help get rid of the air and reduce the bloated feeling in your belly (abdomen).   No driving for 24 hours (because of the medicine (anesthesia) used during the test).    Do not sign any important legal documents or operate any machinery for 24 hours (because of the anesthesia used during the test).  NUTRITION  Drink plenty of fluids.   You may resume your normal diet as instructed by your doctor.   Begin with a light meal and progress to your normal diet. Heavy or fried foods are harder to digest and may make you feel sick to your stomach (nauseated).   Avoid alcoholic beverages for 24 hours or as instructed.  MEDICATIONS  You may resume your normal medications unless your doctor tells you otherwise.  WHAT YOU CAN EXPECT TODAY  Some feelings of bloating in the abdomen.   Passage of more gas than usual.   Spotting of blood in your stool or on the toilet paper.  IF YOU HAD POLYPS REMOVED DURING THE COLONOSCOPY:  No aspirin products for 7 days or as instructed.   No alcohol for 7 days or as instructed.   Eat a soft diet for the next 24 hours.  FINDING OUT THE RESULTS OF YOUR TEST Not all test results are available during your visit. If your test results are not back during the visit, make an appointment  with your caregiver to find out the results. Do not assume everything is normal if you have not heard from your caregiver or the medical facility. It is important for you to follow up on all of your test results.  SEEK IMMEDIATE MEDICAL ATTENTION IF:  You have more than a spotting of blood in your stool.   Your belly is swollen (abdominal distention).   You are nauseated or vomiting.   You have a temperature over 101.   You have abdominal pain or discomfort that is severe or gets worse throughout the day.   Colon polyp, diverticulosis and hemorrhoid information provided  No MRI until clip gone  Further recommendations to follow pending review of pathology report  At a minimum, you will need a repeat look at the large polypectomy site in your rectum from today in 6 months  At patient request, I called niece [name] at [phone] and reviewed results.     Diverticulosis  Diverticulosis is a condition that develops when small pouches (diverticula) form in the wall of the large intestine (colon). The colon is where water is absorbed and stool (feces) is formed. The pouches form when the inside layer of the colon pushes through weak spots in the outer layers of the colon. You may have a few pouches or many of them. The pouches usually do not cause problems unless they become inflamed or infected. When this happens, the condition is called diverticulitis. What are the  causes? The cause of this condition is not known. What increases the risk? The following factors may make you more likely to develop this condition:  Being older than age 26. Your risk for this condition increases with age. Diverticulosis is rare among people younger than age 50. By age 30, many people have it.  Eating a low-fiber diet.  Having frequent constipation.  Being overweight.  Not getting enough exercise.  Smoking.  Taking over-the-counter pain medicines, like aspirin and ibuprofen.  Having a family  history of diverticulosis. What are the signs or symptoms? In most people, there are no symptoms of this condition. If you do have symptoms, they may include:  Bloating.  Cramps in the abdomen.  Constipation or diarrhea.  Pain in the lower left side of the abdomen. How is this diagnosed? Because diverticulosis usually has no symptoms, it is most often diagnosed during an exam for other colon problems. The condition may be diagnosed by:  Using a flexible scope to examine the colon (colonoscopy).  Taking an X-ray of the colon after dye has been put into the colon (barium enema).  Having a CT scan. How is this treated? You may not need treatment for this condition. Your health care provider may recommend treatment to prevent problems. You may need treatment if you have symptoms or if you previously had diverticulitis. Treatment may include:  Eating a high-fiber diet.  Taking a fiber supplement.  Taking a live bacteria supplement (probiotic).  Taking medicine to relax your colon. Follow these instructions at home: Medicines  Take over-the-counter and prescription medicines only as told by your health care provider.  If told by your health care provider, take a fiber supplement or probiotic. Constipation prevention Your condition may cause constipation. To prevent or treat constipation, you may need to:  Drink enough fluid to keep your urine pale yellow.  Take over-the-counter or prescription medicines.  Eat foods that are high in fiber, such as beans, whole grains, and fresh fruits and vegetables.  Limit foods that are high in fat and processed sugars, such as fried or sweet foods.  General instructions  Try not to strain when you have a bowel movement.  Keep all follow-up visits as told by your health care provider. This is important. Contact a health care provider if you:  Have pain in your abdomen.  Have bloating.  Have cramps.  Have not had a bowel movement  in 3 days. Get help right away if:  Your pain gets worse.  Your bloating becomes very bad.  You have a fever or chills, and your symptoms suddenly get worse.  You vomit.  You have bowel movements that are bloody or black.  You have bleeding from your rectum. Summary  Diverticulosis is a condition that develops when small pouches (diverticula) form in the wall of the large intestine (colon).  You may have a few pouches or many of them.  This condition is most often diagnosed during an exam for other colon problems.  Treatment may include increasing the fiber in your diet, taking supplements, or taking medicines. This information is not intended to replace advice given to you by your health care provider. Make sure you discuss any questions you have with your health care provider. Document Revised: 08/11/2018 Document Reviewed: 08/11/2018 Elsevier Patient Education  [place].    Colon Polyps  Polyps are tissue growths inside the body. Polyps can grow in many places, including the large intestine (colon). A polyp may be a  round bump or a mushroom-shaped growth. You could have one polyp or several. Most colon polyps are noncancerous (benign). However, some colon polyps can become cancerous over time. Finding and removing the polyps early can help prevent this. What are the causes? The exact cause of colon polyps is not known. What increases the risk? You are more likely to develop this condition if you:  Have a family history of colon cancer or colon polyps.  Are older than 82 or older than 45 if you are African American.  Have inflammatory bowel disease, such as ulcerative colitis or Crohn's disease.  Have certain hereditary conditions, such as: ? Familial adenomatous polyposis. ? Lynch syndrome. ? Turcot syndrome. ? Peutz-Jeghers syndrome.  Are overweight.  Smoke cigarettes.  Do not get enough exercise.  Drink too much alcohol.  Eat a diet that is  high in fat and red meat and low in fiber.  Had childhood cancer that was treated with abdominal radiation. What are the signs or symptoms? Most polyps do not cause symptoms. If you have symptoms, they may include:  Blood coming from your rectum when having a bowel movement.  Blood in your stool. The stool may look dark red or black.  Abdominal pain.  A change in bowel habits, such as constipation or diarrhea. How is this diagnosed? This condition is diagnosed with a colonoscopy. This is a procedure in which a lighted, flexible scope is inserted into the anus and then passed into the colon to examine the area. Polyps are sometimes found when a colonoscopy is done as part of routine cancer screening tests. How is this treated? Treatment for this condition involves removing any polyps that are found. Most polyps can be removed during a colonoscopy. Those polyps will then be tested for cancer. Additional treatment may be needed depending on the results of testing. Follow these instructions at home: Lifestyle  Maintain a healthy weight, or lose weight if recommended by your health care provider.  Exercise every day or as told by your health care provider.  Do not use any products that contain nicotine or tobacco, such as cigarettes and e-cigarettes. If you need help quitting, ask your health care provider.  If you drink alcohol, limit how much you have: ? 0-1 drink a day for women. ? 0-2 drinks a day for men.  Be aware of how much alcohol is in your drink. In the [place] one drink equals one 12 oz bottle of beer (355 mL), one 5 oz glass of wine (148 mL), or one 1 oz shot of hard liquor (44 mL). Eating and drinking   Eat foods that are high in fiber, such as fruits, vegetables, and whole grains.  Eat foods that are high in calcium and vitamin D, such as milk, cheese, yogurt, eggs, liver, fish, and broccoli.  Limit foods that are high in fat, such as fried foods and desserts.  Limit  the amount of red meat and processed meat you eat, such as hot dogs, sausage, bacon, and lunch meats. General instructions  Keep all follow-up visits as told by your health care provider. This is important. ? This includes having regularly scheduled colonoscopies. ? Talk to your health care provider about when you need a colonoscopy. Contact a health care provider if:  You have new or worsening bleeding during a bowel movement.  You have new or increased blood in your stool.  You have a change in bowel habits.  You lose weight for no known reason. Summary  Polyps are tissue growths inside the body. Polyps can grow in many places, including the colon.  Most colon polyps are noncancerous (benign), but some can become cancerous over time.  This condition is diagnosed with a colonoscopy.  Treatment for this condition involves removing any polyps that are found. Most polyps can be removed during a colonoscopy. This information is not intended to replace advice given to you by your health care provider. Make sure you discuss any questions you have with your health care provider. Document Revised: 04/29/2017 Document Reviewed: 04/29/2017 Elsevier Patient Education  [place].    Hemorrhoids Hemorrhoids are swollen veins in and around the rectum or anus. There are two types of hemorrhoids:  Internal hemorrhoids. These occur in the veins that are just inside the rectum. They may poke through to the outside and become irritated and painful.  External hemorrhoids. These occur in the veins that are outside the anus and can be felt as a painful swelling or hard lump near the anus. Most hemorrhoids do not cause serious problems, and they can be managed with home treatments such as diet and lifestyle changes. If home treatments do not help the symptoms, procedures can be done to shrink or remove the hemorrhoids. What are the causes? This condition is caused by increased pressure in  the anal area. This pressure may result from various things, including:  Constipation.  Straining to have a bowel movement.  Diarrhea.  Pregnancy.  Obesity.  Sitting for long periods of time.  Heavy lifting or other activity that causes you to strain.  Anal sex.  Riding a bike for a long period of time. What are the signs or symptoms? Symptoms of this condition include:  Pain.  Anal itching or irritation.  Rectal bleeding.  Leakage of stool (feces).  Anal swelling.  One or more lumps around the anus. How is this diagnosed? This condition can often be diagnosed through a visual exam. Other exams or tests may also be done, such as:  An exam that involves feeling the rectal area with a gloved hand (digital rectal exam).  An exam of the anal canal that is done using a small tube (anoscope).  A blood test, if you have lost a significant amount of blood.  A test to look inside the colon using a flexible tube with a camera on the end (sigmoidoscopy or colonoscopy). How is this treated? This condition can usually be treated at home. However, various procedures may be done if dietary changes, lifestyle changes, and other home treatments do not help your symptoms. These procedures can help make the hemorrhoids smaller or remove them completely. Some of these procedures involve surgery, and others do not. Common procedures include:  Rubber band ligation. Rubber bands are placed at the base of the hemorrhoids to cut off their blood supply.  Sclerotherapy. Medicine is injected into the hemorrhoids to shrink them.  Infrared coagulation. A type of light energy is used to get rid of the hemorrhoids.  Hemorrhoidectomy surgery. The hemorrhoids are surgically removed, and the veins that supply them are tied off.  Stapled hemorrhoidopexy surgery. The surgeon staples the base of the hemorrhoid to the rectal wall. Follow these instructions at home: Eating and drinking   Eat foods  that have a lot of fiber in them, such as whole grains, beans, nuts, fruits, and vegetables.  Ask your health care provider about taking products that have added fiber (fiber supplements).  Reduce the amount of fat in your diet.  You can do this by eating low-fat dairy products, eating less red meat, and avoiding processed foods.  Drink enough fluid to keep your urine pale yellow. Managing pain and swelling   Take warm sitz baths for 20 minutes, 3-4 times a day to ease pain and discomfort. You may do this in a bathtub or using a portable sitz bath that fits over the toilet.  If directed, apply ice to the affected area. Using ice packs between sitz baths may be helpful. ? Put ice in a plastic bag. ? Place a towel between your skin and the bag. ? Leave the ice on for 20 minutes, 2-3 times a day. General instructions  Take over-the-counter and prescription medicines only as told by your health care provider.  Use medicated creams or suppositories as told.  Get regular exercise. Ask your health care provider how much and what kind of exercise is best for you. In general, you should do moderate exercise for at least 30 minutes on most days of the week (150 minutes each week). This can include activities such as walking, biking, or yoga.  Go to the bathroom when you have the urge to have a bowel movement. Do not wait.  Avoid straining to have bowel movements.  Keep the anal area dry and clean. Use wet toilet paper or moist towelettes after a bowel movement.  Do not sit on the toilet for long periods of time. This increases blood pooling and pain.  Keep all follow-up visits as told by your health care provider. This is important. Contact a health care provider if you have:  Increasing pain and swelling that are not controlled by treatment or medicine.  Difficulty having a bowel movement, or you are unable to have a bowel movement.  Pain or inflammation outside the area of the  hemorrhoids. Get help right away if you have:  Uncontrolled bleeding from your rectum. Summary  Hemorrhoids are swollen veins in and around the rectum or anus.  Most hemorrhoids can be managed with home treatments such as diet and lifestyle changes.  Taking warm sitz baths can help ease pain and discomfort.  In severe cases, procedures or surgery can be done to shrink or remove the hemorrhoids. This information is not intended to replace advice given to you by your health care provider. Make sure you discuss any questions you have with your health care provider. Document Revised: 06/10/2018 Document Reviewed: 06/03/2017 Elsevier Patient Education  [place] After These instructions provide you with information about caring for yourself after your procedure. Your health care provider may also give you more specific instructions. Your treatment has been planned according to current medical practices, but problems sometimes occur. Call your health care provider if you have any problems or questions after your procedure. What can I expect after the procedure? After your procedure, you may:  Feel sleepy for several hours.  Feel clumsy and have poor balance for several hours.  Feel forgetful about what happened after the procedure.  Have poor judgment for several hours.  Feel nauseous or vomit.  Have a sore throat if you had a breathing tube during the procedure. Follow these instructions at home: For at least 24 hours after the procedure:      Have a responsible adult stay with you. It is important to have someone help care for you until you are awake and alert.  Rest as needed.  Do not: ? Participate in activities  in which you could fall or become injured. ? Drive. ? Use heavy machinery. ? Drink alcohol. ? Take sleeping pills or medicines that cause drowsiness. ? Make important decisions or sign legal documents. ? Take care  of children on your own. Eating and drinking  Follow the diet that is recommended by your health care provider.  If you vomit, drink water, juice, or soup when you can drink without vomiting.  Make sure you have little or no nausea before eating solid foods. General instructions  Take over-the-counter and prescription medicines only as told by your health care provider.  If you have sleep apnea, surgery and certain medicines can increase your risk for breathing problems. Follow instructions from your health care provider about wearing your sleep device: ? Anytime you are sleeping, including during daytime naps. ? While taking prescription pain medicines, sleeping medicines, or medicines that make you drowsy.  If you smoke, do not smoke without supervision.  Keep all follow-up visits as told by your health care provider. This is important. Contact a health care provider if:  You keep feeling nauseous or you keep vomiting.  You feel light-headed.  You develop a rash.  You have a fever. Get help right away if:  You have trouble breathing. Summary  For several hours after your procedure, you may feel sleepy and have poor judgment.  Have a responsible adult stay with you for at least 24 hours or until you are awake and alert. This information is not intended to replace advice given to you by your health care provider. Make sure you discuss any questions you have with your health care provider. Document Revised: 04/12/2017 Document Reviewed: 05/05/2015 Elsevier Patient Education  [place].

## 2019-05-01 NOTE — Anesthesia Postprocedure Evaluation (Signed)
Anesthesia Post Note  Patient: Bill Johnson  Procedure(s) Performed: COLONOSCOPY WITH PROPOFOL (N/A ) POLYPECTOMY  Patient location during evaluation: PACU Anesthesia Type: General Level of consciousness: awake and alert and oriented Pain management: pain level controlled Vital Signs Assessment: post-procedure vital signs reviewed and stable Respiratory status: spontaneous breathing Cardiovascular status: blood pressure returned to baseline and stable Postop Assessment: no apparent nausea or vomiting Anesthetic complications: no     Last Vitals:  Vitals:   05/01/19 0705  BP: 137/71  Pulse: 60  Resp: 20  Temp: 36.7 C  SpO2: 97%    Last Pain:  Vitals:   05/01/19 0822  TempSrc:   PainSc: 0-No pain                 Tarhonda Hollenberg

## 2019-05-02 LAB — SURGICAL PATHOLOGY

## 2019-05-03 ENCOUNTER — Other Ambulatory Visit: Payer: Self-pay

## 2019-05-03 ENCOUNTER — Ambulatory Visit (INDEPENDENT_AMBULATORY_CARE_PROVIDER_SITE_OTHER): Payer: Medicare Other | Admitting: Family Medicine

## 2019-05-03 ENCOUNTER — Encounter: Payer: Self-pay | Admitting: Family Medicine

## 2019-05-03 VITALS — BP 124/80 | HR 66 | Temp 98.0°F | Resp 18 | Ht 64.0 in | Wt 172.0 lb

## 2019-05-03 DIAGNOSIS — I10 Essential (primary) hypertension: Secondary | ICD-10-CM | POA: Diagnosis not present

## 2019-05-03 DIAGNOSIS — Z720 Tobacco use: Secondary | ICD-10-CM

## 2019-05-03 DIAGNOSIS — R918 Other nonspecific abnormal finding of lung field: Secondary | ICD-10-CM | POA: Diagnosis not present

## 2019-05-03 DIAGNOSIS — R42 Dizziness and giddiness: Secondary | ICD-10-CM

## 2019-05-03 DIAGNOSIS — K629 Disease of anus and rectum, unspecified: Secondary | ICD-10-CM

## 2019-05-03 DIAGNOSIS — N329 Bladder disorder, unspecified: Secondary | ICD-10-CM | POA: Diagnosis not present

## 2019-05-03 MED ORDER — AMLODIPINE BESYLATE 5 MG PO TABS: 5.0000 mg | ORAL_TABLET | Freq: Every day | ORAL | 3 refills | Status: DC

## 2019-05-03 MED ORDER — DIAZEPAM 5 MG PO TABS: 5.0000 mg | ORAL_TABLET | Freq: Two times a day (BID) | ORAL | 1 refills | Status: DC | PRN

## 2019-05-03 MED ORDER — ATENOLOL 50 MG PO TABS: 50.0000 mg | ORAL_TABLET | Freq: Every day | ORAL | 3 refills | Status: DC

## 2019-05-03 NOTE — Progress Notes (Signed)
   Subjective:    Patient ID: Bill Johnson, male    DOB: December 12, 1955, 64 y.o.   MRN: [id number]  Patient presents for Follow-up (is not fasting) and Dizziness (meclizine/ xanax not helping)   He had colonoscpy recently a few polyps were found, he had scope secondary to abnormal CT abd/pelvis concerning for rectal cancer, with bilat lung nodules also noted and a lesion in her bladder. Since his bowel clean out he has been dizzy, feels like his typical vertigo but meclizine did not help.  Wanted to try a different med Denies any vomiting, no abd pain Appetite is better now off clear liquids   scheduled for endocscopy on the 4/26      He is still smoking 4-5 cig a day   He has not had any ETOH recently  HTN- taking bp meds as prescribed  GAD- has last tab of xanax , needs refill or new med based on above for vertigo   Review Of Systems:  GEN- denies fatigue, fever, weight loss,weakness, recent illness HEENT- denies eye drainage, change in vision, nasal discharge, CVS- denies chest pain, palpitations RESP- denies SOB, cough, wheeze ABD- denies N/V, change in stools, abd pain GU- denies dysuria, hematuria, dribbling, incontinence MSK- denies joint pain, muscle aches, injury Neuro- denies headache, +dizziness, syncope, seizure activity       Objective:    BP 124/80   Pulse 66   Temp 98 F (36.7 C) (Temporal)   Resp 18   Ht 5\' 4"  (1.626 m)   Wt 172 lb (78 kg)   SpO2 94%   BMI 29.52 kg/m  GEN- NAD, alert and oriented x3, non toxic appearing  HEENT- PERRL, EOMI, non injected sclera, pink conjunctiva, MMM, oropharynx clear CVS- RRR, no murmur RESP-CTAB ABD-NABS,soft,NT,ND NEURO- CNII-XII in tact, no nystagmus, gait at baseline  EXT- No edema Pulses- Radial 2+        Assessment & Plan:      Problem List Items Addressed This Visit      Unprioritized   Hypertension    Controlled meds refilled He is scheduled to see cardioloy and urology coming up  EGD in 2  weeks       Relevant Medications   amLODipine (NORVASC) 5 MG tablet   atenolol (TENORMIN) 50 MG tablet   Tobacco use - Primary   Relevant Orders   CT Chest Wo Contrast   Vertigo    D/C meclizine try valium 5mg  BID prn for anxiety /sleep and vertigo Keep pushing fluids        Other Visit Diagnoses    Lung nodules       Bilat nodules, long term smoker, other lesions noted in rectum and bladder, obtain dedicated CT chest , high risk for lung cancer   Relevant Orders   CT Chest Wo Contrast   Lesion of bladder       Relevant Orders   CT Chest Wo Contrast   Rectal lesion       Relevant Orders   CT Chest Wo Contrast      Note: This dictation was prepared with Dragon dictation along with smaller phrase technology. Any transcriptional errors that result from this process are unintentional.

## 2019-05-03 NOTE — Patient Instructions (Addendum)
CT scan of chest to be done for lung nodules  Take valium twice a day as needed for vertigo and sleep Stop the xanax and meclizine F/U 3 months

## 2019-05-03 NOTE — Assessment & Plan Note (Signed)
D/C meclizine try valium 5mg  BID prn for anxiety /sleep and vertigo Keep pushing fluids

## 2019-05-03 NOTE — Assessment & Plan Note (Signed)
Controlled meds refilled He is scheduled to see cardioloy and urology coming up  EGD in 2 weeks

## 2019-05-04 ENCOUNTER — Encounter: Payer: Self-pay | Admitting: Family Medicine

## 2019-05-04 ENCOUNTER — Ambulatory Visit: Payer: Medicare Other | Admitting: Cardiovascular Disease

## 2019-05-05 ENCOUNTER — Encounter: Payer: Self-pay | Admitting: Internal Medicine

## 2019-05-08 ENCOUNTER — Telehealth: Payer: Self-pay

## 2019-05-08 NOTE — Telephone Encounter (Signed)
Per RMR- Send letter to patient.  Send copy of letter with path to referring provider and PCP.   Needs office visit prior to 65-month TCS

## 2019-05-08 NOTE — Telephone Encounter (Signed)
OV made and appt card mailed °

## 2019-05-11 ENCOUNTER — Encounter (HOSPITAL_COMMUNITY): Payer: Self-pay

## 2019-05-11 ENCOUNTER — Emergency Department (HOSPITAL_COMMUNITY): Payer: Medicare Other

## 2019-05-11 ENCOUNTER — Other Ambulatory Visit: Payer: Self-pay

## 2019-05-11 ENCOUNTER — Emergency Department (HOSPITAL_COMMUNITY)
Admission: EM | Admit: 2019-05-11 | Discharge: 2019-05-11 | Disposition: A | Discharge status: Home / Self Care | Payer: Medicare Other | Attending: Emergency Medicine | Admitting: Emergency Medicine

## 2019-05-11 DIAGNOSIS — Z7982 Long term (current) use of aspirin: Secondary | ICD-10-CM | POA: Diagnosis not present

## 2019-05-11 DIAGNOSIS — I10 Essential (primary) hypertension: Secondary | ICD-10-CM | POA: Diagnosis not present

## 2019-05-11 DIAGNOSIS — K219 Gastro-esophageal reflux disease without esophagitis: Secondary | ICD-10-CM | POA: Diagnosis not present

## 2019-05-11 DIAGNOSIS — J449 Chronic obstructive pulmonary disease, unspecified: Secondary | ICD-10-CM | POA: Insufficient documentation

## 2019-05-11 DIAGNOSIS — R079 Chest pain, unspecified: Secondary | ICD-10-CM | POA: Diagnosis not present

## 2019-05-11 DIAGNOSIS — F17210 Nicotine dependence, cigarettes, uncomplicated: Secondary | ICD-10-CM | POA: Diagnosis not present

## 2019-05-11 DIAGNOSIS — Z79899 Other long term (current) drug therapy: Secondary | ICD-10-CM | POA: Insufficient documentation

## 2019-05-11 DIAGNOSIS — R0789 Other chest pain: Secondary | ICD-10-CM

## 2019-05-11 DIAGNOSIS — R0602 Shortness of breath: Secondary | ICD-10-CM | POA: Diagnosis not present

## 2019-05-11 LAB — BASIC METABOLIC PANEL
Anion gap: 11 (ref 5–15)
BUN: 5 mg/dL — ABNORMAL LOW (ref 8–23)
CO2: 23 mmol/L (ref 22–32)
Calcium: 8.9 mg/dL (ref 8.9–10.3)
Chloride: 109 mmol/L (ref 98–111)
Creatinine, Ser: 0.76 mg/dL (ref 0.61–1.24)
GFR calc Af Amer: >60 mL/min (ref >60)
GFR calc non Af Amer: >60 mL/min (ref >60)
Glucose, Bld: 102 mg/dL — ABNORMAL HIGH (ref 70–99)
Potassium: 4.2 mmol/L (ref 3.5–5.1)
Sodium: 143 mmol/L (ref 135–145)

## 2019-05-11 LAB — CBC
HCT: 41.2 % (ref 39.0–52.0)
Hemoglobin: 13.4 g/dL (ref 13.0–17.0)
MCH: 33 pg (ref 26.0–34.0)
MCHC: 32.5 g/dL (ref 30.0–36.0)
MCV: 101.5 fL — ABNORMAL HIGH (ref 80.0–100.0)
Platelets: 200 10*3/uL (ref 150–400)
RBC: 4.06 MIL/uL — ABNORMAL LOW (ref 4.22–5.81)
RDW: 13.7 % (ref 11.5–15.5)
WBC: 6.9 10*3/uL (ref 4.0–10.5)
nRBC: 0 % (ref 0.0–0.2)

## 2019-05-11 LAB — TROPONIN I (HIGH SENSITIVITY)
Troponin I (High Sensitivity): 4 ng/L (ref <18)
Troponin I (High Sensitivity): 4 ng/L (ref <18)

## 2019-05-11 MED ORDER — ACETAMINOPHEN 325 MG PO TABS
650.0000 mg | ORAL_TABLET | Freq: Once | ORAL | Status: AC
  Administered 2019-05-11: 650 mg via ORAL
  Filled 2019-05-11: qty 2

## 2019-05-11 MED ORDER — ALUM & MAG HYDROXIDE-SIMETH 200-200-20 MG/5ML PO SUSP
30.0000 mL | Freq: Once | ORAL | Status: AC
  Administered 2019-05-11: 14:00:00 30 mL via ORAL
  Filled 2019-05-11: qty 30

## 2019-05-11 NOTE — ED Triage Notes (Signed)
Pt reports left sided chest pain on exertion for the past 3 days, no other symptoms. Pt a.o, nad noted

## 2019-05-11 NOTE — Discharge Instructions (Signed)
You can take the medications such as Maalox and Tylenol to help with your symptoms. It is important for you to follow-up with a cardiologist because of your risk factors for developing heart disease. We will refer you to one listed below. Return to the ER if you start to experience worsening chest pain, shortness of breath, leg swelling, lightheadedness or loss of consciousness.

## 2019-05-11 NOTE — ED Provider Notes (Signed)
[facility] EMERGENCY DEPARTMENT Provider Note   CSN: [id number] Arrival date & time: 05/11/19  1038     History Chief Complaint  Patient presents with  . Chest Pain    Bill Johnson is a 64 y.o. male with a past medical history of hypertension, hyperlipidemia, GERD presenting to the ED with a chief complaint of left-sided chest pain.  Since 05/08/2019 patient reports left-sided chest pain worse with exertion and palpation.  States that he has noticed worsening pain when he is working outside.  He was seen and evaluated about 2 months ago for right-sided chest pain and was found to be due to GERD.  He has been taking his Protonix regularly.  He is concerned because this is in a different location than the pain he experiences with GERD.  He has not tried any other medications to help with his pain.  He was scheduled to follow-up with a cardiologist a few weeks ago but canceled the appointment.  He denies any leg swelling, pleuritic pain, recent immobilization or travel, hemoptysis, history of DVT or PE, vomiting, abdominal pain, nausea, fever or cough.  States that his baseline cough and shortness of breath are due to his COPD but have not changed in quality.  HPI     Past Medical History:  Diagnosis Date  . Allergy    seasonal  . Anxiety   . Depression   . GERD (gastroesophageal reflux disease)   . Hypertension   . OA (osteoarthritis) of knee   . Rotator cuff tear arthropathy of both shoulders    On DISABILITY, non operative case  . Shoulder pain   . Vertigo     Patient Active Problem List   Diagnosis Date Noted  . LUQ pain 03/29/2019  . Alcohol abuse 03/27/2019  . Chest pain 03/19/2019  . Hyperlipemia 07/26/2018  . GERD (gastroesophageal reflux disease) 06/24/2018  . OA (osteoarthritis) of shoulder 06/28/2017  . B12 deficiency 07/01/2016  . Hypertension 07/01/2016  . COPD (chronic obstructive pulmonary disease) ([place]) 07/01/2016  . Tobacco use  07/01/2016  . GAD (generalized anxiety disorder) 07/01/2016  . Chronic pain 07/01/2016  . Vertigo 11/28/2014  . Hearing loss 11/28/2014  . Tremor 11/28/2014    Past Surgical History:  Procedure Laterality Date  . CARDIAC CATHETERIZATION  06/2005   no significant CAD  . COLONOSCOPY WITH PROPOFOL N/A 05/01/2019   Procedure: COLONOSCOPY WITH PROPOFOL;  Surgeon: Daneil Dolin, MD;  Location: [facility];  Service: Endoscopy;  Laterality: N/A;  7:30am  . ESOPHAGOGASTRODUODENOSCOPY  08/2005   Dr. Henrene Pastor: small hiatal hernia  . HERNIA REPAIR  1983  . POLYPECTOMY  05/01/2019   Procedure: POLYPECTOMY;  Surgeon: Daneil Dolin, MD;  Location: [facility];  Service: Endoscopy;;  . ROTATOR CUFF REPAIR Bilateral   . TONSILLECTOMY  1977       Family History  Problem Relation Age of Onset  . Heart disease Mother   . Diabetes Mother   . Hypertension Mother   . Heart disease Father   . Hypertension Father   . Stroke Other   . Cancer Other   . Cancer Brother        prostate  . Colon cancer Neg Hx     Social History   Tobacco Use  . Smoking status: Current Every Day Smoker    Packs/day: 1.00    Years: 43.00    Pack years: 43.00    Types: Cigarettes  . Smokeless tobacco: Never  Used  Substance Use Topics  . Alcohol use: Yes    Comment: Drinks 3-4 beers/day   . Drug use: No    Home Medications Prior to Admission medications   Medication Sig Start Date End Date Taking? Authorizing Provider  acetaminophen (TYLENOL) 500 MG tablet Take 500 mg by mouth every 6 (six) hours as needed for mild pain or headache.    Yes [provider]  albuterol (PROVENTIL HFA;VENTOLIN HFA) 108 (90 Base) MCG/ACT inhaler Inhale 2 puffs into the lungs every 6 (six) hours as needed for wheezing or shortness of breath. 10/13/17  Yes Dena Billet B, PA-C  amLODipine (NORVASC) 5 MG tablet Take 1 tablet (5 mg total) by mouth daily. 05/03/19 05/02/20 Yes [place], Modena Nunnery, MD  aspirin 81 MG tablet Take 81 mg  by mouth daily.   Yes [provider]  atenolol (TENORMIN) 50 MG tablet Take 1 tablet (50 mg total) by mouth daily. 05/03/19  Yes [place], Modena Nunnery, MD  atorvastatin (LIPITOR) 40 MG tablet Take 1 tablet (40 mg total) by mouth daily at 6 PM. Take 1 tablet at bedtime for cholesterol Patient taking differently: Take 40 mg by mouth daily.  04/17/19  Yes [place], Modena Nunnery, MD  [employer]S VITAMIN B-12 2000 MCG TBCR Take 2,000 mcg by mouth daily.   Yes [provider]  diazepam (VALIUM) 5 MG tablet Take 1 tablet (5 mg total) by mouth every 12 (twelve) hours as needed for anxiety. 05/03/19  Yes [place], Modena Nunnery, MD  meclizine (ANTIVERT) 25 MG tablet TAKE 1 TABLET BY MOUTH THREE TIMES DAILY AS NEEDED FOR DIZZINESS Patient taking differently: Take 25 mg by mouth 3 (three) times daily as needed (dizziness/vertigo). TAKE 1 TABLET BY MOUTH THREE TIMES DAILY AS NEEDED FOR DIZZINESS 02/14/19  Yes [place], Modena Nunnery, MD  pantoprazole (PROTONIX) 40 MG tablet Take 1 tablet (40 mg total) by mouth 2 (two) times daily. 03/27/19  Yes [place], Modena Nunnery, MD  umeclidinium-vilanterol [facility] ELLIPTA) 62.5-25 MCG/INH AEPB Inhale 1 puff into the lungs daily. 08/29/18  Yes [place], Modena Nunnery, MD    Allergies    Patient has no known allergies.  Review of Systems   Review of Systems  Constitutional: Negative for appetite change, chills and fever.  HENT: Negative for ear pain, rhinorrhea, sneezing and sore throat.   Eyes: Negative for photophobia and visual disturbance.  Respiratory: Negative for cough, chest tightness, shortness of breath and wheezing.   Cardiovascular: Positive for chest pain. Negative for palpitations.  Gastrointestinal: Negative for abdominal pain, blood in stool, constipation, diarrhea, nausea and vomiting.  Genitourinary: Negative for dysuria, hematuria and urgency.  Musculoskeletal: Negative for myalgias.  Skin: Negative for rash.  Neurological: Negative for dizziness, weakness and light-headedness.     Physical Exam Updated Vital Signs BP (!) 110/98 (BP Location: Right Arm)   Pulse 71   Temp 97.9 F (36.6 C) (Oral)   Resp 19   Ht 5\' 4"  (1.626 m)   Wt 78 kg   SpO2 98%   BMI 29.52 kg/m   Physical Exam Vitals and nursing note reviewed.  Constitutional:      General: He is not in acute distress.    Appearance: He is well-developed.  HENT:     Head: Normocephalic and atraumatic.     Nose: Nose normal.  Eyes:     General: No scleral icterus.       Left eye: No discharge.     Conjunctiva/sclera: Conjunctivae normal.  Cardiovascular:  Rate and Rhythm: Normal rate and regular rhythm.     Heart sounds: Normal heart sounds. No murmur. No friction rub. No gallop.   Pulmonary:     Effort: Pulmonary effort is normal. No respiratory distress.     Breath sounds: Normal breath sounds.  Chest:     Chest wall: Tenderness present.    Abdominal:     General: Bowel sounds are normal. There is no distension.     Palpations: Abdomen is soft.     Tenderness: There is no abdominal tenderness. There is no guarding.  Musculoskeletal:        General: No swelling or tenderness. Normal range of motion.     Cervical back: Normal range of motion and neck supple.     Right lower leg: No edema.     Left lower leg: No edema.     Comments: No lower extremity edema, erythema or calf tenderness bilaterally.  Skin:    General: Skin is warm and dry.     Findings: No rash.  Neurological:     Mental Status: He is alert.     Motor: No abnormal muscle tone.     Coordination: Coordination normal.     ED Results / Procedures / Treatments   Labs (all labs ordered are listed, but only abnormal results are displayed) Labs Reviewed  BASIC METABOLIC PANEL - Abnormal; Notable for the following components:      Result Value   Glucose, Bld 102 (*)    BUN 5 (*)    All other components within normal limits  CBC - Abnormal; Notable for the following components:   RBC 4.06 (*)    MCV 101.5 (*)     All other components within normal limits  TROPONIN I (HIGH SENSITIVITY)  TROPONIN I (HIGH SENSITIVITY)    EKG EKG Interpretation  Date/Time:  Thursday May 11 2019 10:42:20 EDT Ventricular Rate:  59 PR Interval:  158 QRS Duration: 92 QT Interval:  428 QTC Calculation: 423 R Axis:   58 Text Interpretation: Sinus bradycardia Otherwise normal ECG Confirmed by Virgel Manifold [phone]) on 05/11/2019 2:09:26 PM   Radiology DG Chest 2 View  Result Date: 05/11/2019 CLINICAL DATA:  Chest pain, shortness of breath EXAM: CHEST - 2 VIEW COMPARISON:  03/18/2019 FINDINGS: Scarring in the lungs bilaterally. Heart and mediastinal contours are within normal limits. No confluent opacities or effusions. No acute bony abnormality. IMPRESSION: Chronic changes.  No active disease. Electronically Signed   By: Rolm Baptise M.D.   On: 05/11/2019 11:13    Procedures Procedures (including critical care time)  Medications Ordered in ED Medications  acetaminophen (TYLENOL) tablet 650 mg (650 mg Oral Given 05/11/19 1351)  alum & mag hydroxide-simeth (MAALOX/MYLANTA) 200-200-20 MG/5ML suspension 30 mL (30 mLs Oral Given 05/11/19 1351)    ED Course  I have reviewed the triage vital signs and the nursing notes.  Pertinent labs & imaging results that were available during my care of the patient were reviewed by me and considered in my medical decision making (see chart for details).    MDM Rules/Calculators/A&P                      64 year old male with a past medical history of hypertension, hyperlipidemia, GERD presents to the ED with a chief complaint of left-sided chest pain.  Symptoms have been going on for the past 3 days, worse with exertion and palpation.  He was seen and evaluated about 2 months  ago for right-sided chest pain, found to be due to GERD.  He is concerned that this is in a different location than the pain he experienced with GERD.  He has not tried any other medications to help with his  symptoms.  He was supposed to follow-up with a cardiologist a few weeks ago but canceled the appointment.  Denies any leg swelling, pleuritic pain, recent immobilization or travel, history of DVT or PE, nausea, vomiting or abdominal pain.  On exam there is tenderness palpation of the left side of the chest.  No abdominal tenderness.  He is not tachycardic, tachypneic hypoxic.  He does have history of COPD but the symptoms have not changed.  EKG shows sinus bradycardia, no ischemic changes.  Chest x-ray shows chronic findings without any acute disease.  CBC, BMP unremarkable.  Serial troponins are negative.  He has a HEART score of 5 which is elevated but based on his negative troponins and his nonischemic EKG, feel that he is reasonable for discharge.  I have discussion with the patient regarding discharge versus admission. I feel that since his symptoms are controlled here with antacids and Tylenol, suspect that his symptoms could again be due to GERD. I did stress importance of following up with a cardiologist based on his risk factors.  Patient is agreeable to this plan and comfortable with discharged.  I doubt PE as a cause of his symptoms as he is low risk by Wells criteria.  We will have him continue antacids and Tylenol at home as needed and return for worsening symptoms.  Patient is hemodynamically stable, in NAD, and able to ambulate in the ED. Evaluation does not show pathology that would require ongoing emergent intervention or inpatient treatment. I have personally reviewed and interpreted all lab work and imaging at today's ED visit. I explained the diagnosis to the patient. Pain has been managed and has no complaints prior to discharge. Patient is comfortable with above plan and is stable for discharge at this time. All questions were answered prior to disposition. Strict return precautions for returning to the ED were discussed. Encouraged follow up with PCP.   An After Visit Summary was printed  and given to the patient.   Portions of this note were generated with Lobbyist. Dictation errors may occur despite best attempts at proofreading.  Final Clinical Impression(s) / ED Diagnoses Final diagnoses:  Chest wall pain  Gastroesophageal reflux disease without esophagitis    Rx / DC Orders ED Discharge Orders    None       Delia Heady, PA-C 05/11/19 1506    Virgel Manifold, MD 05/13/19 1001

## 2019-05-19 ENCOUNTER — Encounter (HOSPITAL_COMMUNITY)
Admit: 2019-05-19 | Discharge: 2019-05-19 | Disposition: A | Discharge status: Home / Self Care | Payer: Medicare Other | Attending: Internal Medicine | Admitting: Internal Medicine

## 2019-05-19 ENCOUNTER — Other Ambulatory Visit (HOSPITAL_COMMUNITY)
Admission: RE | Admit: 2019-05-19 | Discharge: 2019-05-19 | Disposition: A | Discharge status: Home / Self Care | Payer: Medicare Other | Source: Ambulatory Visit | Attending: Internal Medicine | Admitting: Internal Medicine

## 2019-05-19 ENCOUNTER — Telehealth: Payer: Self-pay | Admitting: Internal Medicine

## 2019-05-19 ENCOUNTER — Other Ambulatory Visit: Payer: Self-pay

## 2019-05-19 ENCOUNTER — Ambulatory Visit (HOSPITAL_COMMUNITY)
Admission: RE | Admit: 2019-05-19 | Discharge: 2019-05-19 | Disposition: A | Discharge status: Home / Self Care | Payer: Medicare Other | Source: Ambulatory Visit | Attending: Family Medicine | Admitting: Family Medicine

## 2019-05-19 ENCOUNTER — Other Ambulatory Visit (HOSPITAL_COMMUNITY): Payer: Medicare Other

## 2019-05-19 ENCOUNTER — Encounter (HOSPITAL_COMMUNITY): Admission: RE | Admit: 2019-05-19 | Payer: Medicare Other | Source: Ambulatory Visit | Admitting: Internal Medicine

## 2019-05-19 DIAGNOSIS — K629 Disease of anus and rectum, unspecified: Secondary | ICD-10-CM | POA: Diagnosis not present

## 2019-05-19 DIAGNOSIS — N329 Bladder disorder, unspecified: Secondary | ICD-10-CM

## 2019-05-19 DIAGNOSIS — Z720 Tobacco use: Secondary | ICD-10-CM | POA: Diagnosis not present

## 2019-05-19 DIAGNOSIS — R918 Other nonspecific abnormal finding of lung field: Secondary | ICD-10-CM | POA: Insufficient documentation

## 2019-05-19 DIAGNOSIS — J984 Other disorders of lung: Secondary | ICD-10-CM | POA: Diagnosis not present

## 2019-05-19 NOTE — Telephone Encounter (Signed)
Please make note to follow up cardiology appt so we can get him rescheduled once cleared.

## 2019-05-19 NOTE — Telephone Encounter (Addendum)
Called and informed Bill Johnson (niece, listed on DPR) procedure is being cancelled. She will try to contact him. Per appt desk, cardiology appt is scheduled for 05/25/19.

## 2019-05-19 NOTE — Telephone Encounter (Signed)
Altha Harm, RN from Short Stay called to say that patient's procedure with RMR for Monday was going to be postponed because he will need cardiac clearance.

## 2019-05-19 NOTE — Telephone Encounter (Signed)
Noted  

## 2019-05-19 NOTE — Patient Instructions (Signed)
Spoke with Dr Briant Cedar and Dr Gala Romney concerning patients recent trip to ED for chest pain. Patient is to see cardiology on 05/25/2019 and both doctors feel we should wait until after cardiac visit to have procedure. Patient aware that procedure will be canceled for now and office will call to reschedule. OR scheduler notified as well as RGA.

## 2019-05-19 NOTE — Pre-Procedure Instructions (Signed)
Spoke with Dr Briant Cedar and Dr Gala Romney concerning patients recent trip to ED for chest pain. Patient is to see cardiology on 05/25/2019 and both doctors feel we should wait until after cardiac visit to have procedure. Patient aware that procedure will be canceled for now and office will call to reschedule. OR scheduler notified as well as RGA.

## 2019-05-22 ENCOUNTER — Ambulatory Visit (HOSPITAL_COMMUNITY): Admission: RE | Admit: 2019-05-22 | Payer: Medicare Other | Source: Home / Self Care | Admitting: Internal Medicine

## 2019-05-22 ENCOUNTER — Encounter (HOSPITAL_COMMUNITY): Admission: RE | Payer: Self-pay | Source: Home / Self Care

## 2019-05-22 SURGERY — ESOPHAGOGASTRODUODENOSCOPY (EGD) WITH PROPOFOL
Anesthesia: Monitor Anesthesia Care

## 2019-05-22 SURGERY — ESOPHAGOGASTRODUODENOSCOPY (EGD) WITH PROPOFOL
Anesthesia: Moderate Sedation

## 2019-05-25 ENCOUNTER — Ambulatory Visit (INDEPENDENT_AMBULATORY_CARE_PROVIDER_SITE_OTHER): Payer: Medicare Other | Admitting: Cardiovascular Disease

## 2019-05-25 ENCOUNTER — Encounter: Payer: Self-pay | Admitting: Cardiovascular Disease

## 2019-05-25 ENCOUNTER — Encounter: Payer: Self-pay | Admitting: *Deleted

## 2019-05-25 ENCOUNTER — Other Ambulatory Visit: Payer: Self-pay

## 2019-05-25 VITALS — BP 110/60 | HR 76 | Ht 64.0 in | Wt 170.4 lb

## 2019-05-25 DIAGNOSIS — R5383 Other fatigue: Secondary | ICD-10-CM

## 2019-05-25 DIAGNOSIS — Z720 Tobacco use: Secondary | ICD-10-CM

## 2019-05-25 DIAGNOSIS — I208 Other forms of angina pectoris: Secondary | ICD-10-CM | POA: Diagnosis not present

## 2019-05-25 DIAGNOSIS — I2583 Coronary atherosclerosis due to lipid rich plaque: Secondary | ICD-10-CM | POA: Diagnosis not present

## 2019-05-25 DIAGNOSIS — I2510 Atherosclerotic heart disease of native coronary artery without angina pectoris: Secondary | ICD-10-CM | POA: Diagnosis not present

## 2019-05-25 DIAGNOSIS — I10 Essential (primary) hypertension: Secondary | ICD-10-CM

## 2019-05-25 DIAGNOSIS — R079 Chest pain, unspecified: Secondary | ICD-10-CM | POA: Diagnosis not present

## 2019-05-25 DIAGNOSIS — E7800 Pure hypercholesterolemia, unspecified: Secondary | ICD-10-CM

## 2019-05-25 MED ORDER — METOPROLOL TARTRATE 100 MG PO TABS: 100.0000 mg | ORAL_TABLET | Freq: Once | ORAL | 0 refills | Status: DC

## 2019-05-25 NOTE — Progress Notes (Signed)
CARDIOLOGY CONSULT NOTE  Patient ID: Bill Johnson MRN: [id number] DOB/AGE: [birth year] 64 y.o.  Admit date: (Not on file) Primary Physician: Alycia Rossetti, MD  Reason for Consultation: Chest pain  HPI: Bill Johnson is a 64 y.o. male who is being seen today for the evaluation of chest pain at the request of Murlean Iba, MD.   He was evaluated for chest pain in the ED on 05/11/2019.  I personally reviewed all labs, studies, and documentation.  He presented with left-sided chest pain to the ED.  Previously he had been experiencing right-sided chest pain and was found to have GERD and was prescribed Protonix.  At that time he was hospitalized for atypical chest pain in February 2021.  He has been smoking cigarettes for decades.  He had tenderness to palpation of the left side of his chest.  Troponins were negative.  Chest x-ray showed "chronic changes "but no active disease.  He then underwent a chest CT on 05/19/2019 which showed aortic atherosclerosis and three-vessel coronary artery calcifications. It also demonstrated multiple small bilateral pulmonary nodules and scarring of the medial right upper lobe and lingula.  Echocardiogram on 03/19/2019 demonstrated normal LV systolic function and regional wall motion, LVEF 55 to 60%.  There was mild aortic regurgitation.  I personally reviewed the ECG performed on 05/11/2019 which demonstrates sinus bradycardia, 59 bpm.  There were no ischemic abnormalities.  He is here with his girlfriend.  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  Over the past year he is only been able to work about 2 to 3 hours and then he has to quit due to fatigue.  Over the past several months he has been having intermittent left-sided chest pains.  They can occur when he is walking uphill, push mowing the lawn, and when using a chainsaw.  He has been smoking for 49  years.  Family history: Father had CABG and stents.  Mother died of valvular heart disease.  No Known Allergies  Current Outpatient Medications  Medication Sig Dispense Refill  . acetaminophen (TYLENOL) 500 MG tablet Take 500 mg by mouth every 6 (six) hours as needed for mild pain or headache.     . albuterol (PROVENTIL HFA;VENTOLIN HFA) 108 (90 Base) MCG/ACT inhaler Inhale 2 puffs into the lungs every 6 (six) hours as needed for wheezing or shortness of breath. 1 Inhaler 2  . amLODipine (NORVASC) 5 MG tablet Take 1 tablet (5 mg total) by mouth daily. 90 tablet 3  . aspirin 81 MG tablet Take 81 mg by mouth daily.    Marland Kitchen atenolol (TENORMIN) 50 MG tablet Take 1 tablet (50 mg total) by mouth daily. 90 tablet 3  . atorvastatin (LIPITOR) 40 MG tablet Take 1 tablet (40 mg total) by mouth daily at 6 PM. Take 1 tablet at bedtime for cholesterol (Patient taking differently: Take 40 mg by mouth daily. ) 30 tablet 3  . CVS VITAMIN B-12 2000 MCG TBCR Take 2,000 mcg by mouth daily.    . diazepam (VALIUM) 5 MG tablet Take 1 tablet (5 mg total) by mouth every 12 (twelve) hours as needed for anxiety. 45 tablet 1  . meclizine (ANTIVERT) 25 MG tablet TAKE 1 TABLET BY MOUTH THREE TIMES DAILY AS NEEDED FOR DIZZINESS (Patient taking differently: Take 25 mg by mouth 3 (three) times daily as needed (dizziness/vertigo). TAKE 1 TABLET BY MOUTH THREE  TIMES DAILY AS NEEDED FOR DIZZINESS) 90 tablet 2  . pantoprazole (PROTONIX) 40 MG tablet Take 1 tablet (40 mg total) by mouth 2 (two) times daily. 60 tablet 6  . umeclidinium-vilanterol (ANORO ELLIPTA) 62.5-25 MCG/INH AEPB Inhale 1 puff into the lungs daily. 1 each 11   No current facility-administered medications for this visit.    Past Medical History:  Diagnosis Date  . Allergy    seasonal  . Anxiety   . Depression   . GERD (gastroesophageal reflux disease)   . Hypertension   . OA (osteoarthritis) of knee   . Rotator cuff tear arthropathy of both shoulders    On  DISABILITY, non operative case  . Shoulder pain   . Vertigo     Past Surgical History:  Procedure Laterality Date  . CARDIAC CATHETERIZATION  06/2005   no significant CAD  . COLONOSCOPY WITH PROPOFOL N/A 05/01/2019   Procedure: COLONOSCOPY WITH PROPOFOL;  Surgeon: Daneil Dolin, MD;  [facility];  Service: Endoscopy;  Laterality: N/A;  7:30am  . ESOPHAGOGASTRODUODENOSCOPY  08/2005   Dr. Henrene Pastor: small hiatal hernia  . HERNIA REPAIR  1983  . POLYPECTOMY  05/01/2019   Procedure: POLYPECTOMY;  Surgeon: Daneil Dolin, MD;  [facility]: [facility];  Service: Endoscopy;;  . ROTATOR CUFF REPAIR Bilateral   . TONSILLECTOMY  1977    Social History   Socioeconomic History  . Marital status: Single    Spouse name: Not on file  . Number of children: 2  . Years of education: 47  . Highest education level: Not on file  Occupational History  . Occupation: Film/video editor Designer, television/film set)  Tobacco Use  . Smoking status: Current Every Day Smoker    Packs/day: 1.00    Years: 43.00    Pack years: 43.00    Types: Cigarettes  . Smokeless tobacco: Never Used  Substance and Sexual Activity  . Alcohol use: Yes    Comment: Drinks 3-4 beers/day   . Drug use: No  . Sexual activity: Yes  Other Topics Concern  . Not on file  Social History Narrative   Lives at home with nephew and niece in-law   Caffeine use: Drinks coffee/soda every day   Social Determinants of Health   Financial Resource Strain:   . Difficulty of Paying Living Expenses:   Food Insecurity:   . Worried About Charity fundraiser in the Last Year:   . Arboriculturist in the Last Year:   Transportation Needs:   . Film/video editor (Medical):   Marland Kitchen Lack of Transportation (Non-Medical):   Physical Activity:   . Days of Exercise per Week:   . Minutes of Exercise per Session:   Stress:   . Feeling of Stress :   Social Connections:   . Frequency of Communication with Friends and Family:   . Frequency of Social  Gatherings with Friends and Family:   . Attends Religious Services:   . Active Member of Clubs or Organizations:   . Attends Archivist Meetings:   Marland Kitchen Marital Status:   Intimate Partner Violence:   . Fear of Current or Ex-Partner:   . Emotionally Abused:   Marland Kitchen Physically Abused:   . Sexually Abused:       Current Meds  Medication Sig  . acetaminophen (TYLENOL) 500 MG tablet Take 500 mg by mouth every 6 (six) hours as needed for mild pain or headache.   . albuterol (PROVENTIL HFA;VENTOLIN HFA) 108 (90  Base) MCG/ACT inhaler Inhale 2 puffs into the lungs every 6 (six) hours as needed for wheezing or shortness of breath.  Marland Kitchen amLODipine (NORVASC) 5 MG tablet Take 1 tablet (5 mg total) by mouth daily.  Marland Kitchen aspirin 81 MG tablet Take 81 mg by mouth daily.  Marland Kitchen atenolol (TENORMIN) 50 MG tablet Take 1 tablet (50 mg total) by mouth daily.  Marland Kitchen atorvastatin (LIPITOR) 40 MG tablet Take 1 tablet (40 mg total) by mouth daily at 6 PM. Take 1 tablet at bedtime for cholesterol (Patient taking differently: Take 40 mg by mouth daily. )  . CVS VITAMIN B-12 2000 MCG TBCR Take 2,000 mcg by mouth daily.  . diazepam (VALIUM) 5 MG tablet Take 1 tablet (5 mg total) by mouth every 12 (twelve) hours as needed for anxiety.  . meclizine (ANTIVERT) 25 MG tablet TAKE 1 TABLET BY MOUTH THREE TIMES DAILY AS NEEDED FOR DIZZINESS (Patient taking differently: Take 25 mg by mouth 3 (three) times daily as needed (dizziness/vertigo). TAKE 1 TABLET BY MOUTH THREE TIMES DAILY AS NEEDED FOR DIZZINESS)  . pantoprazole (PROTONIX) 40 MG tablet Take 1 tablet (40 mg total) by mouth 2 (two) times daily.  Marland Kitchen umeclidinium-vilanterol (ANORO ELLIPTA) 62.5-25 MCG/INH AEPB Inhale 1 puff into the lungs daily.      Review of systems complete and found to be negative unless listed above in HPI    Physical exam Blood pressure 110/60, pulse 76, height 5\' 4"  (1.626 m), weight 170 lb 6.4 oz (77.3 kg), SpO2 95 %. General: NAD Neck: No JVD, no  thyromegaly or thyroid nodule.  Lungs: Diminished breath sounds throughout, no crackles or wheezes. CV: Nondisplaced PMI. Regular rate and rhythm, normal S1/S2, no S3/S4, no murmur.  No peripheral edema.  No carotid bruit.    Abdomen: Soft, nontender, no distention.  Skin: Intact without lesions or rashes.  Neurologic: Alert and oriented x 3.  Psych: Normal affect. Extremities: No clubbing or cyanosis.  HEENT: Normal.   ECG: Most recent ECG reviewed.   Labs: Lab Results  Component Value Date/Time   K 4.2 05/11/2019 10:55 AM   BUN 5 (L) 05/11/2019 10:55 AM   BUN 11 11/28/2014 11:33 AM   CREATININE 0.76 05/11/2019 10:55 AM   CREATININE 0.90 01/02/2019 02:32 PM   ALT 12 03/18/2019 08:34 PM   TSH 0.774 11/28/2014 11:33 AM   HGB 13.4 05/11/2019 10:55 AM   HGB 14.6 11/28/2014 11:33 AM     Lipids: Lab Results  Component Value Date/Time   LDLCALC 153 (H) 03/19/2019 08:34 AM   LDLCALC 108 (H) 01/02/2019 02:32 PM   CHOL 249 (H) 03/19/2019 08:34 AM   TRIG 196 (H) 03/19/2019 08:34 AM   HDL 57 03/19/2019 08:34 AM        ASSESSMENT AND PLAN:   1.  Chest pain/coronary artery calcifications/exertional fatigue: Symptoms are suspicious for angina pectoris.  He has multiple cardiovascular risk factors.  I will proceed with coronary CT angiography.  He is on aspirin, beta-blocker, and statin therapy.  2.  Hypertension: Blood pressure is normal.  No changes to therapy.  3.  Hypercholesterolemia: Lipids reviewed above.  He is on atorvastatin.  4.  Tobacco abuse: He has been smoking for 49 years.  He needs cessation.    Disposition: Follow up in 3 months  Signed: Kate Sable, M.D., F.A.C.C.  05/25/2019, 9:14 AM

## 2019-05-25 NOTE — Patient Instructions (Addendum)
Medication Instructions:  Continue all current medications.  Labwork:  BMET - order given today.   Please do 2-3 days prior to CT at [facility].   Testing/Procedures:  Your physician has requested that you have cardiac CT. Cardiac computed tomography (CT) is a painless test that uses an x-ray machine to take clear, detailed pictures of your heart. For further information please visit [email or web address]. Please follow instruction sheet as given.  Office will contact with results via phone or letter.    Follow-Up: 3 months   Any Other Special Instructions Will Be Listed Below (If Applicable).  If you need a refill on your cardiac medications before your next appointment, please call your pharmacy.

## 2019-05-25 NOTE — Addendum Note (Signed)
Addended by: Laurine Blazer on: 05/25/2019 09:33 AM   Modules accepted: Orders

## 2019-05-29 NOTE — Telephone Encounter (Signed)
Per cardiology note, pt will be having coronary CT angiography and f/u in 3 months.

## 2019-05-30 NOTE — Telephone Encounter (Signed)
Noted, coronary CT angiography hasn't been scheduled at this time per appt desk.

## 2019-05-30 NOTE — Telephone Encounter (Signed)
Noted. Await coronary CT angiography.

## 2019-06-05 NOTE — Telephone Encounter (Signed)
Coronary CT angiography hasn't been scheduled at this time per appt desk.

## 2019-06-08 DIAGNOSIS — M47812 Spondylosis without myelopathy or radiculopathy, cervical region: Secondary | ICD-10-CM | POA: Diagnosis not present

## 2019-06-08 NOTE — Telephone Encounter (Signed)
Coronary CT angiography scheduled for 06/12/19 per pt's appt desk.

## 2019-06-09 ENCOUNTER — Telehealth (HOSPITAL_COMMUNITY): Payer: Self-pay | Admitting: *Deleted

## 2019-06-09 NOTE — Telephone Encounter (Signed)
Attempted to call patient regarding upcoming cardiac CT appointment. Left message on voicemail with name and callback number  Carlous Olivares Tai RN Navigator Cardiac Imaging [facility] [phone] Office [phone] Cell 

## 2019-06-12 ENCOUNTER — Ambulatory Visit: Payer: Medicare Other | Admitting: Urology

## 2019-06-12 ENCOUNTER — Ambulatory Visit (HOSPITAL_COMMUNITY): Admission: RE | Admit: 2019-06-12 | Payer: Medicare Other | Source: Ambulatory Visit

## 2019-06-13 ENCOUNTER — Other Ambulatory Visit (HOSPITAL_COMMUNITY)
Admission: RE | Admit: 2019-06-13 | Discharge: 2019-06-13 | Disposition: A | Discharge status: Home / Self Care | Payer: Medicare Other | Source: Ambulatory Visit | Attending: Cardiovascular Disease | Admitting: Cardiovascular Disease

## 2019-06-13 ENCOUNTER — Other Ambulatory Visit: Payer: Self-pay

## 2019-06-13 DIAGNOSIS — I2583 Coronary atherosclerosis due to lipid rich plaque: Secondary | ICD-10-CM | POA: Insufficient documentation

## 2019-06-13 DIAGNOSIS — I2510 Atherosclerotic heart disease of native coronary artery without angina pectoris: Secondary | ICD-10-CM | POA: Diagnosis not present

## 2019-06-13 DIAGNOSIS — I208 Other forms of angina pectoris: Secondary | ICD-10-CM | POA: Diagnosis not present

## 2019-06-13 LAB — BASIC METABOLIC PANEL
Anion gap: 11 (ref 5–15)
BUN: 11 mg/dL (ref 8–23)
CO2: 25 mmol/L (ref 22–32)
Calcium: 8.8 mg/dL — ABNORMAL LOW (ref 8.9–10.3)
Chloride: 105 mmol/L (ref 98–111)
Creatinine, Ser: 0.77 mg/dL (ref 0.61–1.24)
GFR calc Af Amer: >60 mL/min (ref >60)
GFR calc non Af Amer: >60 mL/min (ref >60)
Glucose, Bld: 98 mg/dL (ref 70–99)
Potassium: 4.3 mmol/L (ref 3.5–5.1)
Sodium: 141 mmol/L (ref 135–145)

## 2019-06-13 NOTE — Telephone Encounter (Signed)
CT angiography rescheduled to 06/15/19.

## 2019-06-14 ENCOUNTER — Telehealth (HOSPITAL_COMMUNITY): Payer: Self-pay | Admitting: *Deleted

## 2019-06-14 NOTE — Telephone Encounter (Signed)
Attempted to call patient regarding upcoming cardiac CT appointment. Left message on voicemail with name and callback number  Lillyian Heidt Tai RN Navigator Cardiac Imaging [facility] [phone] Office [phone] Cell  

## 2019-06-15 ENCOUNTER — Ambulatory Visit (HOSPITAL_COMMUNITY)
Admission: RE | Admit: 2019-06-15 | Discharge: 2019-06-15 | Disposition: A | Discharge status: Home / Self Care | Payer: Medicare Other | Source: Ambulatory Visit | Attending: Cardiovascular Disease | Admitting: Cardiovascular Disease

## 2019-06-15 ENCOUNTER — Other Ambulatory Visit: Payer: Self-pay

## 2019-06-15 DIAGNOSIS — I208 Other forms of angina pectoris: Secondary | ICD-10-CM

## 2019-06-15 DIAGNOSIS — I25118 Atherosclerotic heart disease of native coronary artery with other forms of angina pectoris: Secondary | ICD-10-CM | POA: Insufficient documentation

## 2019-06-15 DIAGNOSIS — I2510 Atherosclerotic heart disease of native coronary artery without angina pectoris: Secondary | ICD-10-CM | POA: Diagnosis not present

## 2019-06-15 MED ORDER — NITROGLYCERIN 0.4 MG SL SUBL
SUBLINGUAL_TABLET | SUBLINGUAL | Status: AC
  Filled 2019-06-15: qty 2

## 2019-06-15 MED ORDER — IOHEXOL 350 MG/ML SOLN
100.0000 mL | Freq: Once | INTRAVENOUS | Status: AC | PRN
  Administered 2019-06-15: 100 mL via INTRAVENOUS

## 2019-06-15 MED ORDER — NITROGLYCERIN 0.4 MG SL SUBL
0.8000 mg | SUBLINGUAL_TABLET | Freq: Once | SUBLINGUAL | Status: AC
  Administered 2019-06-15: 0.8 mg via SUBLINGUAL

## 2019-06-16 ENCOUNTER — Other Ambulatory Visit: Payer: Self-pay | Admitting: Family Medicine

## 2019-06-16 MED ORDER — DIAZEPAM 5 MG PO TABS: 5.0000 mg | ORAL_TABLET | Freq: Two times a day (BID) | ORAL | 1 refills | Status: DC | PRN

## 2019-06-16 NOTE — Telephone Encounter (Signed)
Ok to refill??  Last office visit/ refill 05/03/2019, #1 refill.

## 2019-06-16 NOTE — Telephone Encounter (Signed)
CB# [phone] refill Diazepam

## 2019-06-17 DIAGNOSIS — Z6828 Body mass index (BMI) 28.0-28.9, adult: Secondary | ICD-10-CM | POA: Diagnosis not present

## 2019-06-17 DIAGNOSIS — I2510 Atherosclerotic heart disease of native coronary artery without angina pectoris: Secondary | ICD-10-CM | POA: Diagnosis not present

## 2019-06-19 ENCOUNTER — Telehealth: Payer: Self-pay | Admitting: *Deleted

## 2019-06-19 NOTE — Telephone Encounter (Signed)
-----   Message from Herminio Commons, MD sent at 06/19/2019 10:12 AM EDT ----- He needs either virtual visit or an office visit within the next 2 weeks to discuss neck steps (cardiac catheterization).  This can be with either me or an APP if I do not have any availability.

## 2019-06-19 NOTE — Telephone Encounter (Signed)
Bill Johnson, [place]  [id number] PM EDT    Wife [name]) and patient notified. OV scheduled for 06/29/2019 with Katina Dung, NP in [place] office.

## 2019-06-20 NOTE — Telephone Encounter (Signed)
Bill Johnson, see CT result note for 06/15/19.

## 2019-06-20 NOTE — Telephone Encounter (Signed)
Tried to call niece, [name], no answer. LMOVM for return call.

## 2019-06-20 NOTE — Telephone Encounter (Signed)
At this point, it looks like he will have cardiac intervention. He has an appt with EG in 08/2019 because he will be due for six month f/u TCS and maybe we can get EGD at same time if still indicated.

## 2019-06-21 NOTE — Telephone Encounter (Signed)
Letter mailed

## 2019-06-21 NOTE — Telephone Encounter (Signed)
Tried to call niece, no answer, LMOVM for return call.

## 2019-06-29 ENCOUNTER — Encounter: Payer: Self-pay | Admitting: Family Medicine

## 2019-06-29 ENCOUNTER — Other Ambulatory Visit: Payer: Self-pay

## 2019-06-29 ENCOUNTER — Ambulatory Visit (INDEPENDENT_AMBULATORY_CARE_PROVIDER_SITE_OTHER): Payer: Medicare Other | Admitting: Family Medicine

## 2019-06-29 ENCOUNTER — Telehealth: Payer: Self-pay | Admitting: Family Medicine

## 2019-06-29 VITALS — BP 118/80 | HR 74 | Ht 64.0 in | Wt 165.4 lb

## 2019-06-29 DIAGNOSIS — R079 Chest pain, unspecified: Secondary | ICD-10-CM

## 2019-06-29 DIAGNOSIS — I2510 Atherosclerotic heart disease of native coronary artery without angina pectoris: Secondary | ICD-10-CM

## 2019-06-29 DIAGNOSIS — Z720 Tobacco use: Secondary | ICD-10-CM

## 2019-06-29 DIAGNOSIS — I10 Essential (primary) hypertension: Secondary | ICD-10-CM

## 2019-06-29 DIAGNOSIS — Z01810 Encounter for preprocedural cardiovascular examination: Secondary | ICD-10-CM

## 2019-06-29 DIAGNOSIS — E782 Mixed hyperlipidemia: Secondary | ICD-10-CM | POA: Diagnosis not present

## 2019-06-29 NOTE — Progress Notes (Addendum)
Cardiology Office Note  Date: 06/29/2019   ID: Bill Johnson, DOB Nov 23, 1955, MRN [id number]  PCP:  Alycia Rossetti, MD  Cardiologist:  Kate Sable, MD Electrophysiologist:  None   Chief Complaint: Chest pain  History of Present Illness: Bill Johnson is a 64 y.o. male with a history of chest pain, history of smoking, GERD.  Last encounter with Dr. Bronson Ing 05/25/2019.  He was seen at the request of Dr. Alice Rieger, MD.  He was evaluated for chest pain in the emergency room on 05/11/2019.  He presented with left-sided chest pain had been experiencing right-sided chest pain and was found to have GERD and prescribed Protonix.  He was hospitalized for atypical chest pain on February 2021.  History of long-term smoking.  Echocardiogram 03/19/2019 showed normal LV function and regional wall motion EF of 55 to 60%.  Mild aortic regurgitation.  He had been experiencing exertional fatigue with intermittent left-sided chest pains occurring when walking uphill, or using a chainsaw.  Smoking history of 49 years.  Cardiac CT angiogram was ordered.  Blood pressure was normal, lipids were normal he was on atorvastatin.  He was encouraged to stop smoking.  Recent coronary CT on 06/17/2019 showed CT FFR flow analysis demonstrating a possible hemodynamically flow-limiting lesion of the mid LAD and proximal superior branch of the obtuse marginal.  Dr. Bronson Ing was notified of results.  Patient needs a cardiac catheterization per his note.  Patient is here today to discuss the results of his coronary CT and cardiac catheterization.  He continues to come complaint of increased exertional fatigue as well as left-sided chest discomfort when exerting.  States he is becoming less able to perform exertional task and has to rest more often prior to resuming his activity due to some increasing dyspnea and increasing left-sided chest pain.  He denies any radiation to neck, arm, back, jaw.  Denies any  nausea, vomiting, or diaphoresis.  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  He denies any stroke or TIA-like symptoms, palpitations or arrhythmias, orthostatic symptoms, bleeding in stool or urine, claudication-like issues, DVT or PE issues or lower extremity edema.  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  Patient states he is significantly cut down on smoking recently.  States a pack of cigarettes will last him usually around 3 days.   Past Medical History:  Diagnosis Date  . Allergy    seasonal  . Anxiety   . Depression   . GERD (gastroesophageal reflux disease)   . Hypertension   . OA (osteoarthritis) of knee   . Rotator cuff tear arthropathy of both shoulders    On DISABILITY, non operative case  . Shoulder pain   . Vertigo     Past Surgical History:  Procedure Laterality Date  . CARDIAC CATHETERIZATION  06/2005   no significant CAD  . COLONOSCOPY WITH PROPOFOL N/A 05/01/2019   Procedure: COLONOSCOPY WITH PROPOFOL;  Surgeon: Daneil Dolin, MD;  Location: [facility];  Service: Endoscopy;  Laterality: N/A;  7:30am  . ESOPHAGOGASTRODUODENOSCOPY  08/2005   Dr. Henrene Pastor: small hiatal hernia  . HERNIA REPAIR  1983  . POLYPECTOMY  05/01/2019   Procedure: POLYPECTOMY;  Surgeon: Daneil Dolin, MD;  Location: [facility];  Service: Endoscopy;;  .  ROTATOR CUFF REPAIR Bilateral   . TONSILLECTOMY  1977    Current Outpatient Medications  Medication Sig Dispense Refill  . acetaminophen (TYLENOL) 500 MG tablet Take 500 mg by mouth every 6 (six) hours as needed for mild pain or headache.     . albuterol (PROVENTIL HFA;VENTOLIN HFA) 108 (90 Base) MCG/ACT inhaler Inhale 2 puffs into the lungs every 6 (six) hours as needed for wheezing or shortness of breath. 1 Inhaler 2  . amLODipine (NORVASC) 5  MG tablet Take 1 tablet (5 mg total) by mouth daily. 90 tablet 3  . aspirin 81 MG tablet Take 81 mg by mouth daily.    Marland Kitchen atenolol (TENORMIN) 50 MG tablet Take 1 tablet (50 mg total) by mouth daily. 90 tablet 3  . atorvastatin (LIPITOR) 40 MG tablet Take 1 tablet (40 mg total) by mouth daily at 6 PM. Take 1 tablet at bedtime for cholesterol (Patient taking differently: Take 40 mg by mouth daily. ) 30 tablet 3  . CVS VITAMIN B-12 2000 MCG TBCR Take 2,000 mcg by mouth daily.    . diazepam (VALIUM) 5 MG tablet Take 1 tablet (5 mg total) by mouth every 12 (twelve) hours as needed for anxiety. 45 tablet 1  . meclizine (ANTIVERT) 25 MG tablet TAKE 1 TABLET BY MOUTH THREE TIMES DAILY AS NEEDED FOR DIZZINESS (Patient taking differently: Take 25 mg by mouth 3 (three) times daily as needed (dizziness/vertigo). TAKE 1 TABLET BY MOUTH THREE TIMES DAILY AS NEEDED FOR DIZZINESS) 90 tablet 2  . pantoprazole (PROTONIX) 40 MG tablet Take 1 tablet (40 mg total) by mouth 2 (two) times daily. 60 tablet 6  . umeclidinium-vilanterol (ANORO ELLIPTA) 62.5-25 MCG/INH AEPB Inhale 1 puff into the lungs daily. 1 each 11   No current facility-administered medications for this visit.   Allergies:  Patient has no known allergies.   Social History: The patient  reports that he has been smoking cigarettes. He started smoking about 49 years ago. He has a 43.00 pack-year smoking history. He has never used smokeless tobacco. He reports current alcohol use. He reports that he does not use drugs.   Family History: The patient's family history includes Cancer in his brother and another family member; Diabetes in his mother; Heart disease in his father and mother; Hypertension in his father and mother; Stroke in an other family member.   ROS:  Please see the history of present illness. Otherwise, complete review of systems is positive for none.  All other systems are reviewed and negative.   Physical Exam: VS:  BP 118/80   Pulse 74    Ht 5\' 4"  (1.626 m)   Wt 165 lb 6.4 oz (75 kg)   SpO2 97%   BMI 28.39 kg/m , BMI Body mass index is 28.39 kg/m.  Wt Readings from Last 3 Encounters:  06/29/19 165 lb 6.4 oz (75 kg)  05/25/19 170 lb 6.4 oz (77.3 kg)  05/11/19 171 lb 15.3 oz (78 kg)    General: Patient appears comfortable at rest. Neck: Supple, no elevated JVP or carotid bruits, no thyromegaly. Lungs: Clear to auscultation, nonlabored breathing at rest. Cardiac: Regular rate and rhythm, no S3 or significant systolic murmur, no pericardial rub. Extremities: No pitting edema, distal pulses 2+. Skin: Warm and dry. Musculoskeletal: No kyphosis. Neuropsychiatric: Alert and oriented x3, affect grossly appropriate.  ECG:  An ECG dated 06/29/2019 was personally reviewed today and demonstrated:  Normal sinus rhythm rate of 70  Recent Labwork: 03/18/2019: ALT  12; AST 20 05/11/2019: Hemoglobin 13.4; Platelets 200 06/13/2019: BUN 11; Creatinine, Ser 0.77; Potassium 4.3; Sodium 141     Component Value Date/Time   CHOL 249 (H) 03/19/2019 0834   TRIG 196 (H) 03/19/2019 0834   HDL 57 03/19/2019 0834   CHOLHDL 4.4 03/19/2019 0834   VLDL 39 03/19/2019 0834   LDLCALC 153 (H) 03/19/2019 0834   LDLCALC 108 (H) 01/02/2019 1432    Other Studies Reviewed Today:  Coronary CTA 06/17/2019 EXAM: FFRCT ANALYSIS  FINDINGS: FFRct analysis was performed on the original cardiac CT angiogram dataset. Diagrammatic representation of the FFRct analysis is provided in a separate PDF document in PACS. This dictation was created using the PDF document and an interactive 3D model of the results. 3D model is not available in the EMR/PACS. Normal FFR range is >0.80.  1. Left Main: No significant stenosis.  LM FFR = 0.99.  2. LAD: Significant stenosis of the Mid LAD. Proximal FFR = 0.89, Mid FFR =0.52, Distal FFR = <0.5.  3. LCX: Unable to assess. 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  4. Ramus: Significant stenosis of superior  branch of ramus. Proixmal FFR = 0.9, Proximal superior branch FFR = 0.64, Distal Superior branch 0.57. Middle Branch unable to assess. Proximal inferior branch FFR = 0.96, Distal inferior branch FFR = 0.92.  5. RCA: No significant stenosis. Proximal FFR = 0.99, Mid FFR = 0.93, Distal FFR = 0.85. Distal acute RV marginal FFR = 0.82.  IMPRESSION: 1. CT FFR flow analysis demonstrates a possible hemodynamically flow limiting lesion of the mid LAD and proximal superior branch of the OM.  2.  Dr. Bronson Ing notified of results.  Echocardiogram 03/19/2019 1. Left ventricular ejection fraction, by estimation, is 55 to 60%. The left ventricle has normal function. The left ventricle has no regional wall motion abnormalities. Left ventricular diastolic parameters were normal. 2. Right ventricular systolic function is normal. The right ventricular size is normal. Tricuspid regurgitation signal is inadequate for assessing PA pressure. 3. Left atrial size was upper normal. 4. Right atrial size was upper normal. 5. The mitral valve is grossly normal. Trivial mitral valve regurgitation. 6. The aortic valve is tricuspid. Aortic valve regurgitation is mild. 7. The inferior vena cava is normal in size with greater than 50% respiratory variability, suggesting right atrial pressure of 3 mmHg.  Assessment and Plan:  1. Chest pain, unspecified type   2. CAD in native artery   3. Essential hypertension   4. Mixed hyperlipidemia   5. Tobacco abuse   6. Pre-operative cardiovascular examination    1. Chest pain, unspecified type Recent complaints of chest pain, exertional dyspnea, left-sided chest discomfort and exertional fatigue becoming worse with normal activity.  Patient denies any associated nausea, vomiting, or diaphoresis.  Denies any radiation to neck, arm, back, jaw.  Patient states he has multiple family members with coronary artery disease.  Recent coronary CTA.  Continue aspirin 81  mg.  2. CAD in native artery  Recent coronary CT  FFR flow analysis demonstrates a possible hemodynamically flow limiting lesion of the mid LAD and proximal superior branch of the OM.  Schedule for cardiac catheterization per Dr. Court Joy recommendation   3. Essential hypertension Patient is normotensive today with a blood pressure 118/80.  Continue amlodipine 5 mg daily.  Continue atenolol 50 mg daily  4. Mixed hyperlipidemia Recent lipid panel showed total cholesterol 249, triglycerides 196, HDL 57, LDL 153. Continue high intensity atorvastatin 40 mg daily.  May need to  increase to 80 mg in the future.  5. Tobacco abuse Patient has a 49-year history of smoking at least 1 pack/day.  States recently he has cut down significantly and smokes approximately 1 pack every 3 days.  Highly advised cessation.  Patient states he is trying.   Medication Adjustments/Labs and Tests Ordered: Current medicines are reviewed at length with the patient today.  Concerns regarding medicines are outlined above.   Disposition: Follow-up with Dr. Bronson Ing or APP 6 to 8 weeks after cardiac catheterization  Signed, [name] July, NP 06/29/2019 3:12 PM    [facility] [place], [place], [place] [address] Phone: [phone]; Fax: [phone]

## 2019-06-29 NOTE — H&P (View-Only) (Signed)
Cardiology Office Note  Date: 06/29/2019   ID: Bill Johnson, DOB 1955/04/08, MRN [id number]  PCP:  Bill Rossetti, MD  Cardiologist:  Bill Sable, MD Electrophysiologist:  None   Chief Complaint: Chest pain  History of Present Illness: Bill Johnson is a 64 y.o. male with a history of chest pain, history of smoking, GERD.  Last encounter with Dr. Bronson Johnson 05/25/2019.  He was seen at the request of Dr. Alice Rieger, MD.  He was evaluated for chest pain in the emergency room on 05/11/2019.  He presented with left-sided chest pain had been experiencing right-sided chest pain and was found to have GERD and prescribed Protonix.  He was hospitalized for atypical chest pain on February 2021.  History of long-term smoking.  Echocardiogram 03/19/2019 showed normal LV function and regional wall motion EF of 55 to 60%.  Mild aortic regurgitation.  He had been experiencing exertional fatigue with intermittent left-sided chest pains occurring when walking uphill car portion [place] or using a chainsaw.  Smoking history of 49 years.  Cardiac CT angiogram was ordered.  Blood pressure was normal, lipids were normal he was on atorvastatin.  He was encouraged to stop smoking.  Recent coronary CT on 06/17/2019 showed CT FFR flow analysis demonstrating a possible hemodynamically flow-limiting lesion of the mid LAD and proximal superior branch of the obtuse marginal.  Dr. Bronson Johnson was notified of results.  Patient needs a cardiac catheterization per his note.  Patient is here today to discuss the results of his coronary CT and cardiac catheterization.  He continues to come complaint of increased exertional fatigue as well as left-sided chest discomfort when exerting.  States he is becoming less able to perform exertional task and has to rest more often prior to resuming his activity due to some increasing dyspnea and increasing left-sided chest pain.  He denies any radiation to neck, arm, back,  jaw.  Denies any nausea, vomiting, or diaphoresis.  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  He denies any stroke or TIA-like symptoms, palpitations or arrhythmias, orthostatic symptoms, bleeding in stool or urine, claudication-like issues, DVT or PE issues or lower extremity edema.  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  Patient states he is significantly cut down on smoking recently.  States a pack of cigarettes will last him usually around 3 days.   Past Medical History:  Diagnosis Date  . Allergy    seasonal  . Anxiety   . Depression   . GERD (gastroesophageal reflux disease)   . Hypertension   . OA (osteoarthritis) of knee   . Rotator cuff tear arthropathy of both shoulders    On DISABILITY, non operative case  . Shoulder pain   . Vertigo     Past Surgical History:  Procedure Laterality Date  . CARDIAC CATHETERIZATION  06/2005   no significant CAD  . COLONOSCOPY WITH PROPOFOL N/A 05/01/2019   Procedure: COLONOSCOPY WITH PROPOFOL;  Surgeon: Daneil Dolin, MD;  [facility]: [facility];  Service: Endoscopy;  Laterality: N/A;  7:30am  . ESOPHAGOGASTRODUODENOSCOPY  08/2005   Dr. Henrene Pastor: small hiatal hernia  . HERNIA REPAIR  1983  . POLYPECTOMY  05/01/2019   Procedure: POLYPECTOMY;  Surgeon: Daneil Dolin, MD;  Location: [facility];  Service: Endoscopy;;  . ROTATOR CUFF REPAIR Bilateral   . TONSILLECTOMY  1977    Current Outpatient Medications  Medication Sig Dispense Refill  . acetaminophen (TYLENOL) 500 MG tablet Take 500 mg by mouth every 6 (six) hours as needed for mild pain or headache.     . albuterol (PROVENTIL HFA;VENTOLIN HFA) 108 (90 Base) MCG/ACT inhaler Inhale 2 puffs into the lungs every 6 (six) hours as needed for wheezing or shortness of breath. 1 Inhaler 2  .  amLODipine (NORVASC) 5 MG tablet Take 1 tablet (5 mg total) by mouth daily. 90 tablet 3  . aspirin 81 MG tablet Take 81 mg by mouth daily.    Marland Kitchen atenolol (TENORMIN) 50 MG tablet Take 1 tablet (50 mg total) by mouth daily. 90 tablet 3  . atorvastatin (LIPITOR) 40 MG tablet Take 1 tablet (40 mg total) by mouth daily at 6 PM. Take 1 tablet at bedtime for cholesterol (Patient taking differently: Take 40 mg by mouth daily. ) 30 tablet 3  . CVS VITAMIN B-12 2000 MCG TBCR Take 2,000 mcg by mouth daily.    . diazepam (VALIUM) 5 MG tablet Take 1 tablet (5 mg total) by mouth every 12 (twelve) hours as needed for anxiety. 45 tablet 1  . meclizine (ANTIVERT) 25 MG tablet TAKE 1 TABLET BY MOUTH THREE TIMES DAILY AS NEEDED FOR DIZZINESS (Patient taking differently: Take 25 mg by mouth 3 (three) times daily as needed (dizziness/vertigo). TAKE 1 TABLET BY MOUTH THREE TIMES DAILY AS NEEDED FOR DIZZINESS) 90 tablet 2  . pantoprazole (PROTONIX) 40 MG tablet Take 1 tablet (40 mg total) by mouth 2 (two) times daily. 60 tablet 6  . umeclidinium-vilanterol (ANORO ELLIPTA) 62.5-25 MCG/INH AEPB Inhale 1 puff into the lungs daily. 1 each 11   No current facility-administered medications for this visit.   Allergies:  Patient has no known allergies.   Social History: The patient  reports that he has been smoking cigarettes. He started smoking about 49 years ago. He has a 43.00 pack-year smoking history. He has never used smokeless tobacco. He reports current alcohol use. He reports that he does not use drugs.   Family History: The patient's family history includes Cancer in his brother and another family member; Diabetes in his mother; Heart disease in his father and mother; Hypertension in his father and mother; Stroke in an other family member.   ROS:  Please see the history of present illness. Otherwise, complete review of systems is positive for none.  All other systems are reviewed and negative.   Physical Exam: VS:   BP 118/80   Pulse 74   Ht 5\' 4"  (1.626 m)   Wt 165 lb 6.4 oz (75 kg)   SpO2 97%   BMI 28.39 kg/m , BMI Body mass index is 28.39 kg/m.  Wt Readings from Last 3 Encounters:  06/29/19 165 lb 6.4 oz (75 kg)  05/25/19 170 lb 6.4 oz (77.3 kg)  05/11/19 171 lb 15.3 oz (78 kg)    General: Patient appears comfortable at rest. Neck: Supple, no elevated JVP or carotid bruits, no thyromegaly. Lungs: Clear to auscultation, nonlabored breathing at rest. Cardiac: Regular rate and rhythm, no S3 or significant systolic murmur, no pericardial rub. Extremities: No pitting edema, distal pulses 2+. Skin: Warm and dry. Musculoskeletal: No kyphosis. Neuropsychiatric: Alert and oriented x3, affect grossly appropriate.  ECG:  An ECG dated 06/29/2019 was personally reviewed today and demonstrated:  Normal sinus rhythm rate of 70  Recent Labwork: 03/18/2019: ALT 12; AST 20 05/11/2019: Hemoglobin 13.4; Platelets 200 06/13/2019: BUN 11; Creatinine, Ser 0.77; Potassium 4.3; Sodium 141     Component Value Date/Time   CHOL 249 (H) 03/19/2019 0834   TRIG 196 (H) 03/19/2019 0834   HDL 57 03/19/2019 0834   CHOLHDL 4.4 03/19/2019 0834   VLDL 39 03/19/2019 0834   LDLCALC 153 (H) 03/19/2019 0834   LDLCALC 108 (H) 01/02/2019 1432    Other Studies Reviewed Today:  Coronary CTA 06/17/2019 EXAM: FFRCT ANALYSIS  FINDINGS: FFRct analysis was performed on the original cardiac CT angiogram dataset. Diagrammatic representation of the FFRct analysis is provided in a separate PDF document in PACS. This dictation was created using the PDF document and an interactive 3D model of the results. 3D model is not available in the EMR/PACS. Normal FFR range is >0.80.  1. Left Main: No significant stenosis.  LM FFR = 0.99.  2. LAD: Significant stenosis of the Mid LAD. Proximal FFR = 0.89, Mid FFR =0.52, Distal FFR = <0.5.  3. LCX: Unable to assess. 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  4. Ramus: Significant  stenosis of superior branch of ramus. Proixmal FFR = 0.9, Proximal superior branch FFR = 0.64, Distal Superior branch 0.57. Middle Branch unable to assess. Proximal inferior branch FFR = 0.96, Distal inferior branch FFR = 0.92.  5. RCA: No significant stenosis. Proximal FFR = 0.99, Mid FFR = 0.93, Distal FFR = 0.85. Distal acute RV marginal FFR = 0.82.  IMPRESSION: 1. CT FFR flow analysis demonstrates a possible hemodynamically flow limiting lesion of the mid LAD and proximal superior branch of the OM.  2.  Dr. Bronson Johnson notified of results.  Echocardiogram 03/19/2019 1. Left ventricular ejection fraction, by estimation, is 55 to 60%. The left ventricle has normal function. The left ventricle has no regional wall motion abnormalities. Left ventricular diastolic parameters were normal. 2. Right ventricular systolic function is normal. The right ventricular size is normal. Tricuspid regurgitation signal is inadequate for assessing PA pressure. 3. Left atrial size was upper normal. 4. Right atrial size was upper normal. 5. The mitral valve is grossly normal. Trivial mitral valve regurgitation. 6. The aortic valve is tricuspid. Aortic valve regurgitation is mild. 7. The inferior vena cava is normal in size with greater than 50% respiratory variability, suggesting right atrial pressure of 3 mmHg.  Assessment and Plan:  1. Chest pain, unspecified type   2. CAD in native artery   3. Essential hypertension   4. Mixed hyperlipidemia   5. Tobacco abuse   6. Pre-operative cardiovascular examination    1. Chest pain, unspecified type Recent complaints of chest pain, exertional dyspnea, left-sided chest discomfort and exertional fatigue becoming worse with normal activity.  Patient denies any associated nausea, vomiting, or diaphoresis.  Denies any radiation to neck, arm, back, jaw.  Patient states he has multiple family members with coronary artery disease.  Recent coronary CTA.   Continue aspirin 81 mg.  2. CAD in native artery  Recent coronary CT  FFR flow analysis demonstrates a possible hemodynamically flow limiting lesion of the mid LAD and proximal superior branch of the OM.  Schedule for cardiac catheterization per Dr. Court Joy recommendation   3. Essential hypertension Patient is normotensive today with a blood pressure 118/80.  Continue amlodipine 5 mg daily.  Continue atenolol 50 mg daily  4. Mixed hyperlipidemia Recent lipid panel showed total cholesterol 249, triglycerides 196, HDL 57, LDL 153. Continue high intensity atorvastatin 40 mg daily.  May need to increase to 80 mg in the future.  5. Tobacco abuse Patient has a 49-year history of smoking at least 1 pack/day.  States recently he has cut down significantly and smokes approximately 1 pack every 3 days.  Highly advised cessation.  Patient states he is trying.   Medication Adjustments/Labs and Tests Ordered: Current medicines are reviewed at length with the patient today.  Concerns regarding medicines are outlined above.   Disposition: Follow-up with Dr. Bronson Johnson or APP 6 to 8 weeks after cardiac catheterization  Signed, [name] July, NP 06/29/2019 3:12 PM    [facility] [place], [place], [place] [address] Phone: [phone]; Fax: [phone]

## 2019-06-29 NOTE — Telephone Encounter (Signed)
Pre-cert Verification for the following procedure    Left heart cath Wednesday, July 05, 2019 @1 :00 pm arrive at 11:00 am with Dr. [place] dx: abnormal cardiac ct & chest pain

## 2019-06-29 NOTE — Patient Instructions (Addendum)
Medication Instructions:   Your physician recommends that you continue on your current medications as directed. Please refer to the Current Medication list given to you today.   Labwork:  Your physician recommends that you return for lab work Today or Tomorrow for pre-cath lab work (BMET & CBC). This may be done at [facility] or [facility] ([place]) Monday-Friday from 8:00 am - 4:00 pm. No appointment is needed.  Your covid test is Monday, July 03, 2019 @12 :45 pm at [facility] ([place] in [place]) [place] will need to quarantine after your covid test until your heart cath is complete.  Testing/Procedures: Your physician has requested that you have a cardiac catheterization. Cardiac catheterization is used to diagnose and/or treat various heart conditions. Doctors may recommend this procedure for a number of different reasons. The most common reason is to evaluate chest pain. Chest pain can be a symptom of coronary artery disease (CAD), and cardiac catheterization can show whether plaque is narrowing or blocking your heart's arteries. This procedure is also used to evaluate the valves, as well as measure the blood flow and oxygen levels in different parts of your heart. For further information please visit [email or web address]. Please follow instruction sheet, as given.  Follow-Up:  Your physician recommends that you schedule a follow-up appointment in: 1 month (office).  Any Other Special Instructions Will Be Listed Below (If Applicable).  If you need a refill on your cardiac medications before your next appointment, please call your pharmacy.     [place] [address] Dept: [phone] Loc: [place]  06/29/2019  You are scheduled for a Cardiac Catheterization on Wednesday, June 9 with Dr. Peter  [place].  1. Please arrive at the [facility] (Main Entrance A) at [facility]: [address] [place], [place] [address] at 11:00 AM (This time is two hours before your procedure to ensure your preparation). Free valet parking service is available.   Special note: Every effort is made to have your procedure done on time. Please understand that emergencies sometimes delay scheduled procedures.  2. Diet: Do not eat solid foods after midnight.  The patient may have clear liquids until 5am upon the day of the procedure.  3. Labs: You will need to have blood drawn on Friday, June 4 at [place]. [address],   Open: 7am - 6pm, Sat 8am - 12 noon   Phone: [phone]. You do not need to be fasting. Or [facility] from 8:00 am - 4:00 pm.  4. Medication instructions in preparation for your procedure:   Contrast Allergy: No   On the morning of your procedure, take your Aspirin 81 mg and any morning medicines NOT listed above.  You may use sips of water.  5. Plan for one night stay--bring personal belongings. 6. Bring a current list of your medications and current insurance cards. 7. You MUST have a responsible person to drive you home. 8. Someone MUST be with you the first 24 hours after you arrive home or your discharge will be delayed. 9. Please wear clothes that are easy to get on and off and wear slip-on shoes.  Thank you for allowing [place] to care for you!   -- [facility]

## 2019-06-30 ENCOUNTER — Other Ambulatory Visit (HOSPITAL_COMMUNITY)
Admission: RE | Admit: 2019-06-30 | Discharge: 2019-06-30 | Disposition: A | Discharge status: Home / Self Care | Payer: Medicare Other | Source: Ambulatory Visit | Attending: Family Medicine | Admitting: Family Medicine

## 2019-06-30 DIAGNOSIS — Z01810 Encounter for preprocedural cardiovascular examination: Secondary | ICD-10-CM | POA: Insufficient documentation

## 2019-06-30 DIAGNOSIS — I2510 Atherosclerotic heart disease of native coronary artery without angina pectoris: Secondary | ICD-10-CM | POA: Diagnosis not present

## 2019-06-30 DIAGNOSIS — R079 Chest pain, unspecified: Secondary | ICD-10-CM | POA: Insufficient documentation

## 2019-06-30 LAB — BASIC METABOLIC PANEL
Anion gap: 11 (ref 5–15)
BUN: 9 mg/dL (ref 8–23)
CO2: 23 mmol/L (ref 22–32)
Calcium: 8.7 mg/dL — ABNORMAL LOW (ref 8.9–10.3)
Chloride: 105 mmol/L (ref 98–111)
Creatinine, Ser: 0.81 mg/dL (ref 0.61–1.24)
GFR calc Af Amer: >60 mL/min (ref >60)
GFR calc non Af Amer: >60 mL/min (ref >60)
Glucose, Bld: 106 mg/dL — ABNORMAL HIGH (ref 70–99)
Potassium: 4.2 mmol/L (ref 3.5–5.1)
Sodium: 139 mmol/L (ref 135–145)

## 2019-06-30 LAB — CBC
HCT: 41.3 % (ref 39.0–52.0)
Hemoglobin: 13.3 g/dL (ref 13.0–17.0)
MCH: 33.3 pg (ref 26.0–34.0)
MCHC: 32.2 g/dL (ref 30.0–36.0)
MCV: 103.3 fL — ABNORMAL HIGH (ref 80.0–100.0)
Platelets: 227 10*3/uL (ref 150–400)
RBC: 4 MIL/uL — ABNORMAL LOW (ref 4.22–5.81)
RDW: 14.1 % (ref 11.5–15.5)
WBC: 6.8 10*3/uL (ref 4.0–10.5)
nRBC: 0 % (ref 0.0–0.2)

## 2019-07-03 ENCOUNTER — Other Ambulatory Visit (HOSPITAL_COMMUNITY)
Admission: RE | Admit: 2019-07-03 | Discharge: 2019-07-03 | Disposition: A | Discharge status: Home / Self Care | Payer: Medicare Other | Source: Ambulatory Visit | Attending: Family Medicine | Admitting: Family Medicine

## 2019-07-03 DIAGNOSIS — Z20822 Contact with and (suspected) exposure to covid-19: Secondary | ICD-10-CM | POA: Diagnosis not present

## 2019-07-03 DIAGNOSIS — Z01812 Encounter for preprocedural laboratory examination: Secondary | ICD-10-CM | POA: Insufficient documentation

## 2019-07-03 LAB — SARS CORONAVIRUS 2 (TAT 6-24 HRS): SARS Coronavirus 2: NEGATIVE

## 2019-07-04 ENCOUNTER — Telehealth: Payer: Self-pay | Admitting: *Deleted

## 2019-07-04 NOTE — Telephone Encounter (Addendum)
Pt contacted pre-catheterization scheduled at [facility] for: Wednesday July 05, 2019 1 PM Verified arrival time and place: [facility]) at: 11 AM   No solid food after midnight prior to cath, clear liquids until 5 AM day of procedure.  AM meds can be  taken pre-cath with sip of water including: ASA 81 mg   Confirmed patient has responsible adult to drive home post procedure and observe 24 hours after arriving home: yes  You are allowed ONE visitor in the waiting room during your procedure. Both you and your visitor must wear masks.      COVID-19 Pre-Screening Questions:  . In the past 7 to 10 days have you had a cough,  shortness of breath, headache, congestion, fever (100 or greater) body aches, chills, sore throat, or sudden loss of taste or sense of smell? no . Have you been around anyone with known Covid 19 in the past 7 to 10 days? no . Have you been around anyone who is awaiting Covid 19 test results in the past 7 to 10 days? no . Have you been around anyone who has mentioned symptoms of Covid 19 within the past 7 to 10 days? No  Reviewed procedure/mask/visitor instructions, COVID-19 screening questions with patient's niece (DPR), [name].

## 2019-07-05 ENCOUNTER — Ambulatory Visit (HOSPITAL_COMMUNITY)
Admission: RE | Admit: 2019-07-05 | Discharge: 2019-07-06 | Disposition: A | Discharge status: Home / Self Care | Payer: Medicare Other | Source: Ambulatory Visit | Attending: Cardiology | Admitting: Cardiology

## 2019-07-05 ENCOUNTER — Encounter (HOSPITAL_COMMUNITY)
Admission: RE | Disposition: A | Discharge status: Home / Self Care | Payer: Self-pay | Source: Ambulatory Visit | Attending: Cardiology

## 2019-07-05 ENCOUNTER — Other Ambulatory Visit: Payer: Self-pay

## 2019-07-05 DIAGNOSIS — F1010 Alcohol abuse, uncomplicated: Secondary | ICD-10-CM | POA: Diagnosis present

## 2019-07-05 DIAGNOSIS — F419 Anxiety disorder, unspecified: Secondary | ICD-10-CM | POA: Diagnosis not present

## 2019-07-05 DIAGNOSIS — M179 Osteoarthritis of knee, unspecified: Secondary | ICD-10-CM | POA: Diagnosis not present

## 2019-07-05 DIAGNOSIS — Z8249 Family history of ischemic heart disease and other diseases of the circulatory system: Secondary | ICD-10-CM | POA: Insufficient documentation

## 2019-07-05 DIAGNOSIS — I25119 Atherosclerotic heart disease of native coronary artery with unspecified angina pectoris: Secondary | ICD-10-CM

## 2019-07-05 DIAGNOSIS — D649 Anemia, unspecified: Secondary | ICD-10-CM

## 2019-07-05 DIAGNOSIS — I10 Essential (primary) hypertension: Secondary | ICD-10-CM | POA: Diagnosis present

## 2019-07-05 DIAGNOSIS — F329 Major depressive disorder, single episode, unspecified: Secondary | ICD-10-CM | POA: Diagnosis not present

## 2019-07-05 DIAGNOSIS — Z7982 Long term (current) use of aspirin: Secondary | ICD-10-CM | POA: Diagnosis not present

## 2019-07-05 DIAGNOSIS — Z955 Presence of coronary angioplasty implant and graft: Secondary | ICD-10-CM

## 2019-07-05 DIAGNOSIS — Z79899 Other long term (current) drug therapy: Secondary | ICD-10-CM | POA: Insufficient documentation

## 2019-07-05 DIAGNOSIS — J449 Chronic obstructive pulmonary disease, unspecified: Secondary | ICD-10-CM | POA: Diagnosis present

## 2019-07-05 DIAGNOSIS — I209 Angina pectoris, unspecified: Secondary | ICD-10-CM | POA: Diagnosis present

## 2019-07-05 DIAGNOSIS — Z720 Tobacco use: Secondary | ICD-10-CM | POA: Diagnosis present

## 2019-07-05 DIAGNOSIS — E785 Hyperlipidemia, unspecified: Secondary | ICD-10-CM | POA: Diagnosis present

## 2019-07-05 DIAGNOSIS — K219 Gastro-esophageal reflux disease without esophagitis: Secondary | ICD-10-CM | POA: Diagnosis not present

## 2019-07-05 DIAGNOSIS — I2510 Atherosclerotic heart disease of native coronary artery without angina pectoris: Secondary | ICD-10-CM

## 2019-07-05 DIAGNOSIS — E782 Mixed hyperlipidemia: Secondary | ICD-10-CM | POA: Diagnosis not present

## 2019-07-05 DIAGNOSIS — F17210 Nicotine dependence, cigarettes, uncomplicated: Secondary | ICD-10-CM | POA: Insufficient documentation

## 2019-07-05 HISTORY — PX: CORONARY STENT INTERVENTION: CATH118234

## 2019-07-05 HISTORY — DX: Atherosclerotic heart disease of native coronary artery without angina pectoris: I25.10

## 2019-07-05 HISTORY — DX: Tobacco use: Z72.0

## 2019-07-05 HISTORY — DX: Other nonspecific abnormal finding of lung field: R91.8

## 2019-07-05 HISTORY — DX: Essential (primary) hypertension: I10

## 2019-07-05 HISTORY — DX: Alcohol abuse, uncomplicated: F10.10

## 2019-07-05 HISTORY — PX: INTRAVASCULAR ULTRASOUND/IVUS: CATH118244

## 2019-07-05 HISTORY — DX: Hyperlipidemia, unspecified: E78.5

## 2019-07-05 HISTORY — PX: LEFT HEART CATH AND CORONARY ANGIOGRAPHY: CATH118249

## 2019-07-05 HISTORY — PX: INTRAVASCULAR PRESSURE WIRE/FFR STUDY: CATH118243

## 2019-07-05 LAB — CBC
HCT: 43.8 % (ref 39.0–52.0)
Hemoglobin: 14.3 g/dL (ref 13.0–17.0)
MCH: 33 pg (ref 26.0–34.0)
MCHC: 32.6 g/dL (ref 30.0–36.0)
MCV: 101.2 fL — ABNORMAL HIGH (ref 80.0–100.0)
Platelets: 158 10*3/uL (ref 150–400)
RBC: 4.33 MIL/uL (ref 4.22–5.81)
RDW: 13.9 % (ref 11.5–15.5)
WBC: 6.5 10*3/uL (ref 4.0–10.5)
nRBC: 0 % (ref 0.0–0.2)

## 2019-07-05 LAB — POCT ACTIVATED CLOTTING TIME
Activated Clotting Time: 230 seconds
Activated Clotting Time: 615 seconds

## 2019-07-05 SURGERY — LEFT HEART CATH AND CORONARY ANGIOGRAPHY
Anesthesia: LOCAL

## 2019-07-05 MED ORDER — SODIUM CHLORIDE 0.9 % WEIGHT BASED INFUSION
3.0000 mL/kg/h | INTRAVENOUS | Status: DC
  Administered 2019-07-05: 3 mL/kg/h via INTRAVENOUS

## 2019-07-05 MED ORDER — SODIUM CHLORIDE 0.9 % IV SOLN: 250.0000 mL | INTRAVENOUS | Status: DC | PRN

## 2019-07-05 MED ORDER — ATORVASTATIN CALCIUM 40 MG PO TABS
40.0000 mg | ORAL_TABLET | Freq: Every day | ORAL | Status: DC
  Administered 2019-07-05: 40 mg via ORAL
  Filled 2019-07-05: qty 1

## 2019-07-05 MED ORDER — CLOPIDOGREL BISULFATE 300 MG PO TABS
ORAL_TABLET | ORAL | Status: AC
  Filled 2019-07-05: qty 2

## 2019-07-05 MED ORDER — AMLODIPINE BESYLATE 5 MG PO TABS
5.0000 mg | ORAL_TABLET | Freq: Every day | ORAL | Status: DC
  Administered 2019-07-06: 5 mg via ORAL
  Filled 2019-07-05: qty 1

## 2019-07-05 MED ORDER — PANTOPRAZOLE SODIUM 40 MG PO TBEC
40.0000 mg | DELAYED_RELEASE_TABLET | Freq: Two times a day (BID) | ORAL | Status: DC
  Administered 2019-07-05 – 2019-07-06 (×2): 40 mg via ORAL
  Filled 2019-07-05 (×2): qty 1

## 2019-07-05 MED ORDER — SODIUM CHLORIDE 0.9% FLUSH: 3.0000 mL | INTRAVENOUS | Status: DC | PRN

## 2019-07-05 MED ORDER — FENTANYL CITRATE (PF) 100 MCG/2ML IJ SOLN
INTRAMUSCULAR | Status: AC
  Filled 2019-07-05: qty 2

## 2019-07-05 MED ORDER — ASPIRIN EC 81 MG PO TBEC
81.0000 mg | DELAYED_RELEASE_TABLET | Freq: Every day | ORAL | Status: DC
  Administered 2019-07-06: 81 mg via ORAL
  Filled 2019-07-05: qty 1

## 2019-07-05 MED ORDER — VERAPAMIL HCL 2.5 MG/ML IV SOLN
INTRAVENOUS | Status: AC
  Filled 2019-07-05: qty 2

## 2019-07-05 MED ORDER — HEPARIN SODIUM (PORCINE) 1000 UNIT/ML IJ SOLN
INTRAMUSCULAR | Status: DC | PRN
  Administered 2019-07-05: 3000 [IU] via INTRAVENOUS
  Administered 2019-07-05 (×2): 4000 [IU] via INTRAVENOUS

## 2019-07-05 MED ORDER — HEPARIN (PORCINE) IN NACL 1000-0.9 UT/500ML-% IV SOLN
INTRAVENOUS | Status: DC | PRN
  Administered 2019-07-05 (×2): 500 mL

## 2019-07-05 MED ORDER — LIDOCAINE HCL (PF) 1 % IJ SOLN
INTRAMUSCULAR | Status: AC
  Filled 2019-07-05: qty 30

## 2019-07-05 MED ORDER — ADENOSINE 12 MG/4ML IV SOLN
INTRAVENOUS | Status: AC
  Filled 2019-07-05: qty 16

## 2019-07-05 MED ORDER — VERAPAMIL HCL 2.5 MG/ML IV SOLN
INTRAVENOUS | Status: DC | PRN
  Administered 2019-07-05: 10 mL via INTRA_ARTERIAL

## 2019-07-05 MED ORDER — NITROGLYCERIN 1 MG/10 ML FOR IR/CATH LAB
INTRA_ARTERIAL | Status: DC | PRN
  Administered 2019-07-05: 200 ug

## 2019-07-05 MED ORDER — SODIUM CHLORIDE 0.9% FLUSH: 3.0000 mL | Freq: Two times a day (BID) | INTRAVENOUS | Status: DC

## 2019-07-05 MED ORDER — VITAMIN B-12 1000 MCG PO TABS
2000.0000 ug | ORAL_TABLET | Freq: Every day | ORAL | Status: DC
  Administered 2019-07-06 (×2): 2000 ug via ORAL
  Filled 2019-07-05: qty 2

## 2019-07-05 MED ORDER — HEPARIN SODIUM (PORCINE) 1000 UNIT/ML IJ SOLN
INTRAMUSCULAR | Status: AC
  Filled 2019-07-05: qty 1

## 2019-07-05 MED ORDER — ALBUTEROL SULFATE HFA 108 (90 BASE) MCG/ACT IN AERS: 2.0000 | INHALATION_SPRAY | Freq: Four times a day (QID) | RESPIRATORY_TRACT | Status: DC | PRN

## 2019-07-05 MED ORDER — ONDANSETRON HCL 4 MG/2ML IJ SOLN: 4.0000 mg | Freq: Four times a day (QID) | INTRAMUSCULAR | Status: DC | PRN

## 2019-07-05 MED ORDER — ADENOSINE (DIAGNOSTIC) 140MCG/KG/MIN
INTRAVENOUS | Status: AC | PRN
  Administered 2019-07-05: 140 ug/kg/min via INTRAVENOUS

## 2019-07-05 MED ORDER — ACETAMINOPHEN 325 MG PO TABS
650.0000 mg | ORAL_TABLET | ORAL | Status: DC | PRN
  Administered 2019-07-05: 650 mg via ORAL
  Filled 2019-07-05: qty 2

## 2019-07-05 MED ORDER — HEPARIN (PORCINE) IN NACL 1000-0.9 UT/500ML-% IV SOLN
INTRAVENOUS | Status: AC
  Filled 2019-07-05: qty 1000

## 2019-07-05 MED ORDER — CLOPIDOGREL BISULFATE 75 MG PO TABS
75.0000 mg | ORAL_TABLET | Freq: Every day | ORAL | Status: DC
  Administered 2019-07-06: 75 mg via ORAL
  Filled 2019-07-05: qty 1

## 2019-07-05 MED ORDER — SODIUM CHLORIDE 0.9% FLUSH
3.0000 mL | Freq: Two times a day (BID) | INTRAVENOUS | Status: DC
  Administered 2019-07-06: 3 mL via INTRAVENOUS

## 2019-07-05 MED ORDER — LIDOCAINE HCL (PF) 1 % IJ SOLN
INTRAMUSCULAR | Status: DC | PRN
  Administered 2019-07-05: 2 mL

## 2019-07-05 MED ORDER — IOHEXOL 350 MG/ML SOLN
INTRAVENOUS | Status: DC | PRN
  Administered 2019-07-05: 135 mL

## 2019-07-05 MED ORDER — CLOPIDOGREL BISULFATE 300 MG PO TABS
ORAL_TABLET | ORAL | Status: DC | PRN
  Administered 2019-07-05: 600 mg via ORAL

## 2019-07-05 MED ORDER — NITROGLYCERIN 1 MG/10 ML FOR IR/CATH LAB
INTRA_ARTERIAL | Status: AC
  Filled 2019-07-05: qty 10

## 2019-07-05 MED ORDER — FENTANYL CITRATE (PF) 100 MCG/2ML IJ SOLN
INTRAMUSCULAR | Status: DC | PRN
  Administered 2019-07-05: 25 ug via INTRAVENOUS

## 2019-07-05 MED ORDER — MIDAZOLAM HCL 2 MG/2ML IJ SOLN
INTRAMUSCULAR | Status: DC | PRN
  Administered 2019-07-05: 1 mg via INTRAVENOUS

## 2019-07-05 MED ORDER — ASPIRIN 81 MG PO CHEW: 81.0000 mg | CHEWABLE_TABLET | ORAL | Status: DC

## 2019-07-05 MED ORDER — ENOXAPARIN SODIUM 40 MG/0.4ML ~~LOC~~ SOLN
40.0000 mg | SUBCUTANEOUS | Status: DC
  Administered 2019-07-06: 40 mg via SUBCUTANEOUS
  Filled 2019-07-05: qty 0.4

## 2019-07-05 MED ORDER — UMECLIDINIUM-VILANTEROL 62.5-25 MCG/INH IN AEPB
1.0000 | INHALATION_SPRAY | Freq: Every day | RESPIRATORY_TRACT | Status: DC
  Administered 2019-07-06: 1 via RESPIRATORY_TRACT
  Filled 2019-07-05: qty 14

## 2019-07-05 MED ORDER — ATENOLOL 25 MG PO TABS
50.0000 mg | ORAL_TABLET | Freq: Every day | ORAL | Status: DC
  Administered 2019-07-06: 50 mg via ORAL
  Filled 2019-07-05: qty 2

## 2019-07-05 MED ORDER — MIDAZOLAM HCL 2 MG/2ML IJ SOLN
INTRAMUSCULAR | Status: AC
  Filled 2019-07-05: qty 2

## 2019-07-05 MED ORDER — SODIUM CHLORIDE 0.9 % WEIGHT BASED INFUSION
1.0000 mL/kg/h | INTRAVENOUS | Status: DC
  Administered 2019-07-05: 13.32 mL/kg/h via INTRAVENOUS

## 2019-07-05 MED ORDER — SODIUM CHLORIDE 0.9 % WEIGHT BASED INFUSION
1.0000 mL/kg/h | INTRAVENOUS | Status: AC
  Administered 2019-07-05: 1 mL/kg/h via INTRAVENOUS

## 2019-07-05 SURGICAL SUPPLY — 20 items
BALLN SAPPHIRE ~~LOC~~ 3.0X15 (BALLOONS) ×1 IMPLANT
BALLN SAPPHIRE ~~LOC~~ 3.5X15 (BALLOONS) ×1 IMPLANT
CATH 5FR JL3.5 JR4 ANG PIG MP (CATHETERS) ×1 IMPLANT
CATH OPTICROSS 40MHZ (CATHETERS) ×1 IMPLANT
CATH VISTA GUIDE 6FR XBLAD3.5 (CATHETERS) ×1 IMPLANT
DEVICE RAD COMP TR BAND LRG (VASCULAR PRODUCTS) ×1 IMPLANT
GLIDESHEATH SLEND SS 6F .021 (SHEATH) ×1 IMPLANT
GUIDEWIRE INQWIRE 1.5J.035X260 (WIRE) IMPLANT
GUIDEWIRE PRESSURE COMET II (WIRE) ×1 IMPLANT
INQWIRE 1.5J .035X260CM (WIRE) ×2
KIT ENCORE 26 ADVANTAGE (KITS) ×1 IMPLANT
KIT ESSENTIALS PG (KITS) ×1 IMPLANT
KIT HEART LEFT (KITS) ×2 IMPLANT
PACK CARDIAC CATHETERIZATION (CUSTOM PROCEDURE TRAY) ×2 IMPLANT
SLED PULL BACK IVUS (MISCELLANEOUS) ×1 IMPLANT
STENT RESOLUTE ONYX 2.5X30 (Permanent Stent) ×1 IMPLANT
STENT RESOLUTE ONYX 3.0X22 (Permanent Stent) ×1 IMPLANT
TRANSDUCER W/STOPCOCK (MISCELLANEOUS) ×2 IMPLANT
TUBING CIL FLEX 10 FLL-RA (TUBING) ×2 IMPLANT
WIRE ASAHI PROWATER 180CM (WIRE) ×1 IMPLANT

## 2019-07-05 NOTE — Interval H&P Note (Signed)
History and Physical Interval Note:  07/05/2019 1:59 PM  Bill Johnson  has presented today for surgery, with the diagnosis of abnoraml CT.  The various methods of treatment have been discussed with the patient and family. After consideration of risks, benefits and other options for treatment, the patient has consented to  Procedure(s): LEFT HEART CATH AND CORONARY ANGIOGRAPHY (N/A) as a surgical intervention.  The patient's history has been reviewed, patient examined, no change in status, stable for surgery.  I have reviewed the patient's chart and labs.  Questions were answered to the patient's satisfaction.   Cath Lab Visit (complete for each Cath Lab visit)  Clinical Evaluation Leading to the Procedure:   ACS: No.  Non-ACS:    Anginal Classification: CCS III  Anti-ischemic medical therapy: Maximal Therapy (2 or more classes of medications)  Non-Invasive Test Results: Intermediate-risk stress test findings: cardiac mortality 1-3%/year  Prior CABG: No previous CABG        [facility] 07/05/2019 1:59 PM

## 2019-07-05 NOTE — Progress Notes (Signed)
Lab called and states Bmet is Hemolyzed new order placed

## 2019-07-06 ENCOUNTER — Encounter (HOSPITAL_COMMUNITY): Payer: Self-pay | Admitting: Cardiology

## 2019-07-06 DIAGNOSIS — F419 Anxiety disorder, unspecified: Secondary | ICD-10-CM | POA: Diagnosis not present

## 2019-07-06 DIAGNOSIS — Z8249 Family history of ischemic heart disease and other diseases of the circulatory system: Secondary | ICD-10-CM | POA: Diagnosis not present

## 2019-07-06 DIAGNOSIS — Z7982 Long term (current) use of aspirin: Secondary | ICD-10-CM | POA: Diagnosis not present

## 2019-07-06 DIAGNOSIS — F17210 Nicotine dependence, cigarettes, uncomplicated: Secondary | ICD-10-CM | POA: Diagnosis not present

## 2019-07-06 DIAGNOSIS — K219 Gastro-esophageal reflux disease without esophagitis: Secondary | ICD-10-CM | POA: Diagnosis not present

## 2019-07-06 DIAGNOSIS — M179 Osteoarthritis of knee, unspecified: Secondary | ICD-10-CM | POA: Diagnosis not present

## 2019-07-06 DIAGNOSIS — D649 Anemia, unspecified: Secondary | ICD-10-CM

## 2019-07-06 DIAGNOSIS — I10 Essential (primary) hypertension: Secondary | ICD-10-CM | POA: Diagnosis not present

## 2019-07-06 DIAGNOSIS — I25119 Atherosclerotic heart disease of native coronary artery with unspecified angina pectoris: Secondary | ICD-10-CM | POA: Diagnosis not present

## 2019-07-06 DIAGNOSIS — E782 Mixed hyperlipidemia: Secondary | ICD-10-CM | POA: Diagnosis not present

## 2019-07-06 DIAGNOSIS — I209 Angina pectoris, unspecified: Secondary | ICD-10-CM

## 2019-07-06 DIAGNOSIS — Z79899 Other long term (current) drug therapy: Secondary | ICD-10-CM | POA: Diagnosis not present

## 2019-07-06 DIAGNOSIS — I2510 Atherosclerotic heart disease of native coronary artery without angina pectoris: Secondary | ICD-10-CM

## 2019-07-06 DIAGNOSIS — F329 Major depressive disorder, single episode, unspecified: Secondary | ICD-10-CM | POA: Diagnosis not present

## 2019-07-06 LAB — CBC
HCT: 38.7 % — ABNORMAL LOW (ref 39.0–52.0)
Hemoglobin: 12.6 g/dL — ABNORMAL LOW (ref 13.0–17.0)
MCH: 33.1 pg (ref 26.0–34.0)
MCHC: 32.6 g/dL (ref 30.0–36.0)
MCV: 101.6 fL — ABNORMAL HIGH (ref 80.0–100.0)
Platelets: 150 10*3/uL (ref 150–400)
RBC: 3.81 MIL/uL — ABNORMAL LOW (ref 4.22–5.81)
RDW: 14 % (ref 11.5–15.5)
WBC: 5.7 10*3/uL (ref 4.0–10.5)
nRBC: 0 % (ref 0.0–0.2)

## 2019-07-06 LAB — BASIC METABOLIC PANEL
Anion gap: 7 (ref 5–15)
BUN: 8 mg/dL (ref 8–23)
CO2: 23 mmol/L (ref 22–32)
Calcium: 8.3 mg/dL — ABNORMAL LOW (ref 8.9–10.3)
Chloride: 111 mmol/L (ref 98–111)
Creatinine, Ser: 0.83 mg/dL (ref 0.61–1.24)
GFR calc Af Amer: >60 mL/min (ref >60)
GFR calc non Af Amer: >60 mL/min (ref >60)
Glucose, Bld: 92 mg/dL (ref 70–99)
Potassium: 3.7 mmol/L (ref 3.5–5.1)
Sodium: 141 mmol/L (ref 135–145)

## 2019-07-06 MED ORDER — CLOPIDOGREL BISULFATE 75 MG PO TABS: 75.0000 mg | ORAL_TABLET | Freq: Every day | ORAL | 11 refills | Status: DC

## 2019-07-06 MED ORDER — DIAZEPAM 5 MG PO TABS: 5.0000 mg | ORAL_TABLET | Freq: Two times a day (BID) | ORAL | Status: DC | PRN

## 2019-07-06 MED ORDER — THE SENSUOUS HEART BOOK
Freq: Once | Status: AC
  Filled 2019-07-06: qty 1

## 2019-07-06 MED ORDER — NITROGLYCERIN 0.4 MG SL SUBL
0.4000 mg | SUBLINGUAL_TABLET | SUBLINGUAL | 3 refills | Status: DC | PRN
Start: 2019-07-06 — End: 2020-05-06

## 2019-07-06 MED ORDER — ANGIOPLASTY BOOK
Freq: Once | Status: AC
  Filled 2019-07-06: qty 1

## 2019-07-06 MED ORDER — ATORVASTATIN CALCIUM 80 MG PO TABS: 80.0000 mg | ORAL_TABLET | Freq: Every day | ORAL | Status: DC

## 2019-07-06 MED ORDER — ATORVASTATIN CALCIUM 80 MG PO TABS: 80.0000 mg | ORAL_TABLET | Freq: Every evening | ORAL | 6 refills | Status: DC

## 2019-07-06 NOTE — Progress Notes (Signed)
Progress Note  Patient Name: Bill Johnson Date of Encounter: 07/06/2019  Primary Cardiologist: Kate Sable, MD  Subjective   Last name is pronounced Gal (like girl/gal) -int.  Denies CP or SOB. Feeling well this AM. Very hard of hearing.  Inpatient Medications    Scheduled Meds: . amLODipine  5 mg Oral Daily  . aspirin EC  81 mg Oral Daily  . atenolol  50 mg Oral Daily  . atorvastatin  40 mg Oral QHS  . clopidogrel  75 mg Oral Q breakfast  . enoxaparin (LOVENOX) injection  40 mg Subcutaneous Q24H  . pantoprazole  40 mg Oral BID  . sodium chloride flush  3 mL Intravenous Q12H  . umeclidinium-vilanterol  1 puff Inhalation Daily  . vitamin B-12  2,000 mcg Oral Daily   Continuous Infusions: . sodium chloride     PRN Meds: sodium chloride, acetaminophen, ondansetron (ZOFRAN) IV, sodium chloride flush   Vital Signs    Vitals:   07/05/19 1932 07/05/19 2302 07/06/19 0616 07/06/19 0758  BP: (!) 100/56 107/68 112/71   Pulse: 73 72 76   Resp:   18   Temp: 97.9 F (36.6 C) 97.6 F (36.4 C) 97.7 F (36.5 C)   TempSrc: Oral Axillary Oral   SpO2: 95% 95% 96% 97%  Weight:   74.6 kg   Height:        Intake/Output Summary (Last 24 hours) at 07/06/2019 0938 Last data filed at 07/06/2019 5102 Gross per 24 hour  Intake 1220.39 ml  Output 1080 ml  Net 140.39 ml   Last 3 Weights 07/06/2019 07/05/2019 06/29/2019  Weight (lbs) 164 lb 7.1 oz 165 lb 165 lb 6.4 oz  Weight (kg) 74.59 kg 74.844 kg 75.025 kg     Telemetry    NSR, occ PVCs - Personally Reviewed  ECG    NSR 64bpm no acute STT changes - Personally Reviewed  Physical Exam   GEN: No acute distress HEENT: Normocephalic, atraumatic, sclera non-icteric, long hair Neck: No JVD or bruits. Cardiac: RRR no murmurs, rubs, or gallops.  Radials/DP/PT 1+ and equal bilaterally.  Respiratory: Clear to auscultation bilaterally. Breathing is unlabored. GI: Soft, nontender, non-distended, BS +x 4. MS: no  deformity. Extremities: No clubbing or cyanosis. No edema. Distal pedal pulses are 2+ and equal bilaterally. Right radial cath site without hematoma or ecchymosis; good pulse. Neuro:  AAOx3. Follows commands. Psych:  Responds to questions appropriately with a normal affect.  Labs    High Sensitivity Troponin:  No results for input(s): TROPONINIHS in the last 720 hours.    Cardiac EnzymesNo results for input(s): TROPONINI in the last 168 hours. No results for input(s): TROPIPOC in the last 168 hours.   Chemistry Recent Labs  Lab 06/30/19 0928 07/06/19 0425  NA 139 141  K 4.2 3.7  CL 105 111  CO2 23 23  GLUCOSE 106* 92  BUN 9 8  CREATININE 0.81 0.83  CALCIUM 8.7* 8.3*  GFRNONAA >60 >60  GFRAA >60 >60  ANIONGAP 11 7     Hematology Recent Labs  Lab 06/30/19 0928 07/05/19 1058 07/06/19 0425  WBC 6.8 6.5 5.7  RBC 4.00* 4.33 3.81*  HGB 13.3 14.3 12.6*  HCT 41.3 43.8 38.7*  MCV 103.3* 101.2* 101.6*  MCH 33.3 33.0 33.1  MCHC 32.2 32.6 32.6  RDW 14.1 13.9 14.0  PLT 227 158 150    BNPNo results for input(s): BNP, PROBNP in the last 168 hours.   DDimer No results for  input(s): DDIMER in the last 168 hours.   Radiology    CARDIAC CATHETERIZATION  Result Date: 07/05/2019  Prox LAD to Mid LAD lesion is 70% stenosed.  A drug-eluting stent was successfully placed using a STENT RESOLUTE ONYX 3.0X22.  Post intervention, there is a 0% residual stenosis.  Mid LAD lesion is 50% stenosed.  A drug-eluting stent was successfully placed using a STENT RESOLUTE ONYX 2.5X30.  Post intervention, there is a 0% residual stenosis.  1st Mrg lesion is 95% stenosed.  The left ventricular systolic function is normal.  LV end diastolic pressure is normal.  The left ventricular ejection fraction is 55-65% by visual estimate.  1. Severe 2 vessel obstructive CAD. Abnormal FFR of the LAD. 2. Normal LV function 3. Normal LVEDP 4. Successful PCI of the ostial to mid LAD with IVUS guidance and DES  x 2. Plan: DAPT for one year. Optimize medical therapy. Smoking cessation. If patient continues to have symptoms despite optimal therapy could consider PCI of the OM but this is a small branch and supplies a relatively small area of myocardium.    Cardiac Studies   LHC 07/05/19  Prox LAD to Mid LAD lesion is 70% stenosed.  A drug-eluting stent was successfully placed using a STENT RESOLUTE ONYX 3.0X22.  Post intervention, there is a 0% residual stenosis.  Mid LAD lesion is 50% stenosed.  A drug-eluting stent was successfully placed using a STENT RESOLUTE ONYX 2.5X30.  Post intervention, there is a 0% residual stenosis.  1st Mrg lesion is 95% stenosed.  The left ventricular systolic function is normal.  LV end diastolic pressure is normal.  The left ventricular ejection fraction is 55-65% by visual estimate.   1. Severe 2 vessel obstructive CAD. Abnormal FFR of the LAD. 2. Normal LV function 3. Normal LVEDP 4. Successful PCI of the ostial to mid LAD with IVUS guidance and DES x 2.   Plan: DAPT for one year. Optimize medical therapy. Smoking cessation. If patient continues to have symptoms despite optimal therapy could consider PCI of the OM but this is a small branch and supplies a relatively small area of myocardium.    Patient Profile     64 y.o. male with HTN, depression, GERD, anxiety, OA, vertigo, tobacco abuse, habitual ETOH abuse, hyperlipidemia who presented to [facility] for planned cardiac cath. He had recently presented to the office with exertional chest discomfort and dyspnea concerning for exertional angina. Coronary CTA was suggestive of possible hemodynamically flow-limiting lesion of the mid LAD and proximal superior branch of the obtuse marginal, therefore cardiac cath was arranged.  Assessment & Plan    1. Exertional angina with newly diagnosed CAD - s/p successful PCI of the ostial to mid LAD with IVUS guidance and DES x 2. LVEDP and LVEF was normal. Recommend  uninterrupted dual antiplatelet therapy with Aspirin 81mg  daily and Clopidogrel 75mg  daily for a minimum of 12 months (ACS-Class I recommendation). If patient continues to have symptoms despite optimal therapy could consider PCI of the OM but this is a small branch and supplies a relatively small area of myocardium.   2. Essential HTN - BP well controlled. No changes to regimen today.  3. Hyperlipidemia - titrate atorvastatin from 40->80mg . LDL in 02/2019 was 153 but per prior notes, patient had been off statin at some point (unclear if this was checked when that occurred). LFTs were normal at that time. If the patient is tolerating statin at time of follow-up appointment, would consider rechecking liver  function/lipid panel in 6-8 weeks.  4. Tobacco/ETOH abuse - counseled on importance of cessation. Was drinking/smoking more heavily but has significantly cut down tobacco. Still drinking about 4 beers a day but we discussed importance of cutting down and speaking with PCP about strategies for cessation.  5. Mild anemia - macrocytosis noted on lab, likely related to ETOH. Slight downtrend of Hgb post-cath to 12.6 but no bleeding reported - suspect related to procedure/hdration. Can consider f/u Hgb at OV.  Has post cath f/u already scheduled in [place] on 07/27/19, should be OK for this timeframe. He does not work so does not need work note.  For questions or updates, please contact [place] Please consult [email or web address] for contact info under Cardiology/STEMI.  Signed, Charlie Pitter, PA-C 07/06/2019, 9:38 AM

## 2019-07-06 NOTE — Discharge Summary (Addendum)
Discharge Summary    Patient ID: Bill Johnson MRN: [id number]; DOB: 09/20/1955  Admit date: 07/05/2019 Discharge date: 07/06/2019  Primary Care Provider: Alycia Rossetti, MD  Primary Cardiologist: Kate Sable, MD  Primary Electrophysiologist:  None   Discharge Diagnoses    Principal Problem:   Angina pectoris [facility]) Active Problems:   Hypertension   COPD (chronic obstructive pulmonary disease) ([place])   Tobacco use   Hyperlipemia   Alcohol abuse   CAD in native artery   Mild anemia    Diagnostic Studies/Procedures    LHC 07/05/19   Prox LAD to Mid LAD lesion is 70% stenosed.  A drug-eluting stent was successfully placed using a STENT RESOLUTE ONYX 3.0X22.  Post intervention, there is a 0% residual stenosis.  Mid LAD lesion is 50% stenosed.  A drug-eluting stent was successfully placed using a STENT RESOLUTE ONYX 2.5X30.  Post intervention, there is a 0% residual stenosis.  1st Mrg lesion is 95% stenosed.  The left ventricular systolic function is normal.  LV end diastolic pressure is normal.  The left ventricular ejection fraction is 55-65% by visual estimate.   1. Severe 2 vessel obstructive CAD. Abnormal FFR of the LAD. 2. Normal LV function 3. Normal LVEDP 4. Successful PCI of the ostial to mid LAD with IVUS guidance and DES x 2.   Plan: DAPT for one year. Optimize medical therapy. Smoking cessation. If patient continues to have symptoms despite optimal therapy could consider PCI of the OM but this is a small branch and supplies a relatively small area of myocardium.   Antiplatelet/Anticoag Recommend uninterrupted dual antiplatelet therapy with Aspirin 81mg  daily and Clopidogrel 75mg  daily for a minimum of 12 months (ACS-Class I recommendation).     _____________   History of Present Illness     Bill Johnson is a 64 y.o. male with HTN, depression, GERD, anxiety, OA, vertigo, tobacco abuse, habitual ETOH abuse, hyperlipidemia who  presented to [facility] for planned cardiac cath.   He had recently presented to the office with exertional chest discomfort and dyspnea concerning for exertional angina. Coronary CTA was suggestive of possible hemodynamically flow-limiting lesion of the mid LAD and proximal superior branch of the obtuse marginal, therefore cardiac cath was arranged.  Hospital Course     1. Exertional angina with newly diagnosed CAD - s/p successful PCI of the ostial to mid LAD with IVUS guidance and DES x 2. LVEDP and LVEF was normal. Recommend uninterrupted dual antiplatelet therapy with Aspirin 81mg  daily and Clopidogrel 75mg  daily for a minimum of 12 months (ACS-Class I recommendation). If patient continues to have symptoms despite optimal therapy could consider PCI of the OM but this is a small branch and supplies a relatively small area of myocardium.   2. Essential HTN - BP well controlled. No changes to regimen today.  3. Hyperlipidemia - atorvastatin was titrated from 40->80mg . LDL in 02/2019 was 153 but per prior notes, patient had been off statin at some point (unclear if this was checked when that occurred). LFTs were normal at that time. If the patient is tolerating statin at time of follow-up appointment, would consider rechecking liver function/lipid panel in 6-8 weeks.  4. Tobacco/ETOH abuse - counseled on importance of cessation. Was drinking/smoking more heavily but has significantly cut down tobacco. Still drinking about 4 beers a day but we discussed importance of cutting down and speaking with PCP about strategies for cessation.  5. Mild anemia - macrocytosis noted on lab, likely related to  ETOH. Slight downtrend of Hgb post-cath to 12.6 but no bleeding reported - suspect related to procedure/hdration. Can consider f/u Hgb at OV.  6. Pulmonary nodules - his coronary CT overread showed nonspecific findings and pulmonary nodules. Per report, "Mild soft tissue fullness in the bilateral hilar regions.  Findings are nonspecific but could be reactive. In addition, there are several small pulmonary nodules with mean diameter of less than 5 mm. These pulmonary nodules are similar to the exam on 05/19/2019. Consider a follow-up chest CT with IV contrast in 6-12 months to evaluate the hilar tissue." We informed patient of this result today and advised he f/u his PCP to discuss timing of follow-up.  He is feeling well and is eager to go home. Cath site stable. He already has post cath f/u already scheduled in [place] on 08/02/19. He does not work so does not need work note. Dr. Burt Knack has seen and examined the patient today and feels he is stable for discharge.  Did the patient have an acute coronary syndrome (MI, NSTEMI, STEMI, etc) this admission?:  No                               Did the patient have a percutaneous coronary intervention (stent / angioplasty)?:  Yes.     Cath/PCI Registry Performance & Quality Measures: 1. Aspirin prescribed? - Yes 2. ADP Receptor Inhibitor (Plavix/Clopidogrel, Brilinta/Ticagrelor or Effient/Prasugrel) prescribed (includes medically managed patients)? - Yes 3. High Intensity Statin (Lipitor 40-80mg  or Crestor 20-40mg ) prescribed? - Yes 4. For EF <40%, was ACEI/ARB prescribed? - Not Applicable (EF >/= 42%) 5. For EF <40%, Aldosterone Antagonist (Spironolactone or Eplerenone) prescribed? - Not Applicable (EF >/= 68%) 6. Cardiac Rehab Phase II ordered? - Yes   _____________  Discharge Vitals Blood pressure 132/82, pulse 70, temperature 98 F (36.7 C), temperature source Oral, resp. rate 18, height 5\' 4"  (1.626 m), weight 74.6 kg, SpO2 100 %.  Filed Weights   07/05/19 1059 07/06/19 0616  Weight: 74.8 kg 74.6 kg    Labs & Radiologic Studies    CBC Recent Labs    07/05/19 1058 07/06/19 0425  WBC 6.5 5.7  HGB 14.3 12.6*  HCT 43.8 38.7*  MCV 101.2* 101.6*  PLT 158 341   Basic Metabolic Panel Recent Labs    07/06/19 0425  NA 141  K 3.7  CL 111  CO2 23   GLUCOSE 92  BUN 8  CREATININE 0.83  CALCIUM 8.3*    CARDIAC CATHETERIZATION  Result Date: 07/05/2019  Prox LAD to Mid LAD lesion is 70% stenosed.  A drug-eluting stent was successfully placed using a STENT RESOLUTE ONYX 3.0X22.  Post intervention, there is a 0% residual stenosis.  Mid LAD lesion is 50% stenosed.  A drug-eluting stent was successfully placed using a STENT RESOLUTE ONYX 2.5X30.  Post intervention, there is a 0% residual stenosis.  1st Mrg lesion is 95% stenosed.  The left ventricular systolic function is normal.  LV end diastolic pressure is normal.  The left ventricular ejection fraction is 55-65% by visual estimate.  1. Severe 2 vessel obstructive CAD. Abnormal FFR of the LAD. 2. Normal LV function 3. Normal LVEDP 4. Successful PCI of the ostial to mid LAD with IVUS guidance and DES x 2. Plan: DAPT for one year. Optimize medical therapy. Smoking cessation. If patient continues to have symptoms despite optimal therapy could consider PCI of the OM but this is a  small branch and supplies a relatively small area of myocardium.   CT CORONARY MORPH W/CTA COR W/SCORE W/CA W/CM &/OR WO/CM  Addendum Date: 06/15/2019   ADDENDUM REPORT: 06/15/2019 22:46 EXAM: Cardiac/Coronary  CT TECHNIQUE: The patient was scanned on a [employer]. FINDINGS: A 120 kV prospective scan was triggered in the descending thoracic aorta at 111 HU's. Axial non-contrast 3 mm slices were carried out through the heart. The data set was analyzed on a dedicated work station and scored using the [place]. Gantry rotation speed was 250 msecs and collimation was .6 mm. No beta blockade and 0.8 mg of sl NTG was given. The 3D data set was reconstructed in 5% intervals of the 67-82 % of the R-R cycle. Diastolic phases were analyzed on a dedicated work station using MPR, MIP and VRT modes. The patient received 80 cc of contrast. Aorta:  Normal size.  No calcifications.  No dissection. Aortic Valve:   Trileaflet.  No calcifications. 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 RCA is a large dominant artery that gives rise to PDA and PLVB. There is a moderate complex mixed plaque in the proximal RCA with associated stenosis of 50-69%. There is a moderate calcified plaque in a large acute RV marginal branch with associated stenosis of 50-69% followed by a minimal calcified plaque with positive remodeling with associated stenosis of 0-24%. Left main is a large artery that gives rise to LAD and Ramus. There is no plaque. LAD is a large vessel. There is moderate calcified plaque in the proximal LAD with associated stenosis of 50-69%. There is severe calcified plaque in the mid LAD with associated stenosis of 70-99%. There is mild scattered calcified plaque in the distal LAD with associated stenosis of 25-49%. The ramus is a large trifurcating vessel. There is mild calcified plaque in the proximal Ramus with associated stenosis of 25-49%. There is a severe noncalcified plaque in the proximal superior branch of the Ramus with associated stenosis of 70-99%. LCX is a non-dominant artery and has an anomalous origin off the RCA traversing posterior to the aorta. It is a small caliber vessel with moderate calcified plaque with associated stenosis of 50-60%. Other findings: Normal pulmonary vein drainage into the left atrium. Normal let atrial appendage without a thrombus. Normal size of the pulmonary artery. Possible left atrial diverticulum. IMPRESSION: 1. Coronary calcium score of 1108. This was 93rd percentile for age and sex matched control. 2. 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 3. Severe atherosclerosis of the LAD and moderate atherosclerosis of the RCA and anomalous LCx. CAD-RADS 4a. 4.  Recommend cardiac catheterization. 5. Consider symptom guided anti-ischemic and preventive pharmacotherapy as well as risk  factor modification per guideline directed care. 6.   This study has been sent for FFR analysis. Fransico Him Electronically Signed   By: Fransico Him   On: 06/15/2019 22:46   Result Date: 06/15/2019 EXAM: OVER-READ INTERPRETATION  CT CHEST The following report is an over-read performed by radiologist Dr. Markus Daft of [facility], [place] on 06/15/2019. This over-read does not include interpretation of cardiac or coronary anatomy or pathology. The coronary calcium score/coronary CTA interpretation by the cardiologist is attached. COMPARISON:  Chest CT 05/19/2019 FINDINGS: Vascular: Normal caliber of the visualized thoracic aorta without significant atherosclerotic disease. Limited evaluation of the pulmonary arteries. No significant pericardial effusion. Mediastinum/Nodes: Prominent right hilar tissue measuring 1.3 cm in the  short axis on sequence 11, image 17. Left hilar tissue measures up to 0.8 cm on sequence 11, image 17. Mild soft tissue fullness in the right infrahilar region on sequence 11, image 37. No significant mediastinal lymphadenopathy. Lungs/Pleura: Again noted is scarring along the right minor fissure. Stable 2 mm nodule in the right middle lobe on sequence 12, image 28. Stable tiny nodule in the right middle lobe on image 29. Stable 4 mm nodule in the right middle lobe on image 38. Stable 4 mm nodule in the superior segment of the right lower lobe on image 5. Dependent densities in the lower lobes likely represent atelectasis. No large pleural effusions. Stable oval shaped nodule in the left upper lobe on sequence 12, image 13 measures 6 x 3 cm. There is another elongated density along the left side of the mediastinum on sequence 4, image 11 that could represent focal pleural thickening and similar to the recent chest CT. Upper Abdomen: Limited evaluation of the upper abdomen. Musculoskeletal: Degenerative disc and endplate changes in the thoracic spine. IMPRESSION: 1. No acute abnormality. 2.  Mild soft tissue fullness in the bilateral hilar regions. Findings are nonspecific but could be reactive. In addition, there are several small pulmonary nodules with mean diameter of less than 5 mm. These pulmonary nodules are similar to the exam on 05/19/2019. Consider a follow-up chest CT with IV contrast in 6-12 months to evaluate the hilar tissue. Electronically Signed: By: Markus Daft M.D. On: 06/15/2019 12:07   CT CORONARY FRACTIONAL FLOW RESERVE DATA PREP  Result Date: 06/17/2019 EXAM: FFRCT ANALYSIS FINDINGS: FFRct analysis was performed on the original cardiac CT angiogram dataset. Diagrammatic representation of the FFRct analysis is provided in a separate PDF document in PACS. This dictation was created using the PDF document and an interactive 3D model of the results. 3D model is not available in the EMR/PACS. Normal FFR range is >0.80. 1. Left Main: No significant stenosis.  LM FFR = 0.99. 2. LAD: Significant stenosis of the Mid LAD. Proximal FFR = 0.89, Mid FFR =0.52, Distal FFR = <0.5. 3. LCX: Unable to assess. 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 4. Ramus: Significant stenosis of superior branch of ramus. Proixmal FFR = 0.9, Proximal superior branch FFR = 0.64, Distal Superior branch 0.57. Middle Branch unable to assess. Proximal inferior branch FFR = 0.96, Distal inferior branch FFR = 0.92. 5. RCA: No significant stenosis. Proximal FFR = 0.99, Mid FFR = 0.93, Distal FFR = 0.85. Distal acute RV marginal FFR = 0.82. IMPRESSION: 1. CT FFR flow analysis demonstrates a possible hemodynamically flow limiting lesion of the mid LAD and proximal superior branch of the OM. 2.  Dr. Bronson Ing notified of results. Fransico Him Electronically Signed   By: Fransico Him   On: 06/17/2019 23:45   Disposition   Pt is being discharged home today in good condition.  Follow-up Plans & Appointments     Follow-up Information    Verta Ellen., NP Follow up.   Specialty: Cardiology Why: [facility] - keep follow-up with Katina Dung, NP, on Wednesday August 02, 2019 10:40 AM (Arrive by 10:25 AM). Contact information: [place] [address] [phone]        Alycia Rossetti, MD Follow up.   Specialty: Family Medicine Why: Your CT scan in May showed nonspecific fullness of some of the tissue in your chest and pulmonary nodules. The radiologist recommended repeating the test with contrast in 1  year. Please discuss this with your primary care provider. Contact information: [place] [address] [phone]              Discharge Instructions    Amb Referral to Cardiac Rehabilitation   Complete by: As directed    Diagnosis:  Coronary Stents PTCA     After initial evaluation and assessments completed: Virtual Based Care may be provided alone or in conjunction with Phase 2 Cardiac Rehab based on patient barriers.: Yes   Diet - low sodium heart healthy   Complete by: As directed    Discharge instructions   Complete by: As directed    You were started on a new medicine called Plavix/clopidogrel. This is to take in addition to your aspirin. If you notice any bleeding such as blood in stool, black tarry stools, blood in urine, nosebleeds or any other unusual bleeding, call your doctor immediately. It is not normal to have this kind of bleeding while on a blood thinner and usually indicates there is an underlying problem with one of your body systems that needs to be checked out.   You were given a prescription for as-needed nitroglycerin.  Your cholesterol medicine, atorvastatin, was increased to 80mg  - new prescription given.   Increase activity slowly   Complete by: As directed    No driving for 2 days. No lifting over 5 lbs for 1 week. No sexual activity for 1 week. Keep procedure site clean & dry. If you notice increased pain, swelling, bleeding or pus, call/return!  You may shower, but no soaking baths/hot tubs/pools for 1  week.      Discharge Medications   Allergies as of 07/06/2019   No Known Allergies     Medication List    TAKE these medications   acetaminophen 500 MG tablet Commonly known as: TYLENOL Take 500 mg by mouth every 6 (six) hours as needed for mild pain or headache.   albuterol 108 (90 Base) MCG/ACT inhaler Commonly known as: VENTOLIN HFA Inhale 2 puffs into the lungs every 6 (six) hours as needed for wheezing or shortness of breath.   amLODipine 5 MG tablet Commonly known as: NORVASC Take 1 tablet (5 mg total) by mouth daily.   aspirin 81 MG tablet Take 81 mg by mouth daily.   atenolol 50 MG tablet Commonly known as: TENORMIN Take 1 tablet (50 mg total) by mouth daily.   atorvastatin 80 MG tablet Commonly known as: LIPITOR Take 1 tablet (80 mg total) by mouth every evening. What changed:   medication strength  how much to take  when to take this  additional instructions   clopidogrel 75 MG tablet Commonly known as: PLAVIX Take 1 tablet (75 mg total) by mouth daily.   [employer] Vitamin B-12 2000 MCG Tbcr Generic drug: Cyanocobalamin Take 2,000 mcg by mouth daily.   diazepam 5 MG tablet Commonly known as: VALIUM Take 1 tablet (5 mg total) by mouth every 12 (twelve) hours as needed for anxiety.   meclizine 25 MG tablet Commonly known as: ANTIVERT TAKE 1 TABLET BY MOUTH THREE TIMES DAILY AS NEEDED FOR DIZZINESS What changed:   how much to take  how to take this  when to take this  reasons to take this   nitroGLYCERIN 0.4 MG SL tablet Commonly known as: Nitrostat Place 1 tablet (0.4 mg total) under the tongue every 5 (five) minutes as needed for chest pain (up to 3 doses).   pantoprazole 40  MG tablet Commonly known as: PROTONIX Take 1 tablet (40 mg total) by mouth 2 (two) times daily.   umeclidinium-vilanterol 62.5-25 MCG/INH Aepb Commonly known as: ANORO ELLIPTA Inhale 1 puff into the lungs daily.          Outstanding Labs/Studies   Outlined  above  Duration of Discharge Encounter   Greater than 30 minutes including physician time.  Signed, Charlie Pitter, PA-C 07/06/2019, 11:32 AM  Patient seen, examined. Available data reviewed. Agree with findings, assessment, and plan as outlined by Melina Copa, PA-C. On my exam he is alert oriented in NAD. JVP normal, lungs CTA, heart RRR no murmur or gallop, abdomen soft, NT, radial cath site clear, no edema. Pt s/p PCI yesterday without any complication. DC med list reviewed and agree. Discussed need for Etoh and tobacco cessation - he is motivated to quit. Plans to drink non-alcoholic beer. Follow-up as outlined above. Post-PCI restrictions reviewed.   Sherren Mocha, M.D. 07/06/2019 11:57 AM

## 2019-07-06 NOTE — Progress Notes (Signed)
CARDIAC REHAB PHASE I   PRE:  Rate/Rhythm: 75 SR    BP: sitting 133/84    SaO2: 99 RA  MODE:  Ambulation: 400 ft   POST:  Rate/Rhythm: 85 SR    BP: sitting 141/77     SaO2:   Pt able to ambulate quicker, with more stamina today. Has arthritis. Discussed stents, Plavix, restrictions, smoking cessation, diet, exercise, NTG and CRPII. Pt receptive. Not interested in CRPII as he likes to work/exercise at home. Will refer to [place] to meet requirements. He is eager to quit smoking. [place], ACSM 07/06/2019 10:38 AM

## 2019-07-17 ENCOUNTER — Telehealth: Payer: Self-pay | Admitting: Family Medicine

## 2019-07-17 NOTE — Telephone Encounter (Signed)
Patient walked in stating that the went for a walk yesterday and then started having an "achy" feeling in his check. States I just don't feel right.

## 2019-07-17 NOTE — Telephone Encounter (Signed)
Pt says he went for a walk and started feeling achy and burning in his chest and was concerned (recent cath on 6/9) denies any chest pain but says he just doesn't feel right - has not taken any NTG - scheduled pt with Katina Dung, NP on Friday and made pt aware to go to [facility] if symptoms worsen

## 2019-07-19 ENCOUNTER — Encounter (HOSPITAL_COMMUNITY): Payer: Self-pay

## 2019-07-19 ENCOUNTER — Other Ambulatory Visit: Payer: Self-pay

## 2019-07-19 ENCOUNTER — Emergency Department (HOSPITAL_COMMUNITY): Payer: Medicare Other

## 2019-07-19 ENCOUNTER — Inpatient Hospital Stay (HOSPITAL_COMMUNITY)
Admission: EM | Admit: 2019-07-19 | Discharge: 2019-07-21 | DRG: 247 | Disposition: A | Discharge status: Home / Self Care | Payer: Medicare Other | Attending: Cardiovascular Disease | Admitting: Cardiovascular Disease

## 2019-07-19 DIAGNOSIS — I2510 Atherosclerotic heart disease of native coronary artery without angina pectoris: Secondary | ICD-10-CM | POA: Diagnosis not present

## 2019-07-19 DIAGNOSIS — I10 Essential (primary) hypertension: Secondary | ICD-10-CM | POA: Diagnosis present

## 2019-07-19 DIAGNOSIS — Z87891 Personal history of nicotine dependence: Secondary | ICD-10-CM

## 2019-07-19 DIAGNOSIS — R079 Chest pain, unspecified: Secondary | ICD-10-CM

## 2019-07-19 DIAGNOSIS — Z955 Presence of coronary angioplasty implant and graft: Secondary | ICD-10-CM

## 2019-07-19 DIAGNOSIS — H9190 Unspecified hearing loss, unspecified ear: Secondary | ICD-10-CM | POA: Diagnosis present

## 2019-07-19 DIAGNOSIS — Z79899 Other long term (current) drug therapy: Secondary | ICD-10-CM

## 2019-07-19 DIAGNOSIS — J449 Chronic obstructive pulmonary disease, unspecified: Secondary | ICD-10-CM | POA: Diagnosis present

## 2019-07-19 DIAGNOSIS — M1710 Unilateral primary osteoarthritis, unspecified knee: Secondary | ICD-10-CM | POA: Diagnosis present

## 2019-07-19 DIAGNOSIS — I25111 Atherosclerotic heart disease of native coronary artery with angina pectoris with documented spasm: Secondary | ICD-10-CM | POA: Diagnosis not present

## 2019-07-19 DIAGNOSIS — E538 Deficiency of other specified B group vitamins: Secondary | ICD-10-CM | POA: Diagnosis present

## 2019-07-19 DIAGNOSIS — Z7982 Long term (current) use of aspirin: Secondary | ICD-10-CM

## 2019-07-19 DIAGNOSIS — K219 Gastro-esophageal reflux disease without esophagitis: Secondary | ICD-10-CM | POA: Diagnosis present

## 2019-07-19 DIAGNOSIS — D649 Anemia, unspecified: Secondary | ICD-10-CM | POA: Diagnosis present

## 2019-07-19 DIAGNOSIS — Z7902 Long term (current) use of antithrombotics/antiplatelets: Secondary | ICD-10-CM

## 2019-07-19 DIAGNOSIS — M19019 Primary osteoarthritis, unspecified shoulder: Secondary | ICD-10-CM | POA: Diagnosis present

## 2019-07-19 DIAGNOSIS — R0789 Other chest pain: Secondary | ICD-10-CM | POA: Diagnosis not present

## 2019-07-19 DIAGNOSIS — R918 Other nonspecific abnormal finding of lung field: Secondary | ICD-10-CM | POA: Diagnosis present

## 2019-07-19 DIAGNOSIS — Z8249 Family history of ischemic heart disease and other diseases of the circulatory system: Secondary | ICD-10-CM

## 2019-07-19 DIAGNOSIS — E785 Hyperlipidemia, unspecified: Secondary | ICD-10-CM | POA: Diagnosis present

## 2019-07-19 DIAGNOSIS — F1010 Alcohol abuse, uncomplicated: Secondary | ICD-10-CM | POA: Diagnosis present

## 2019-07-19 DIAGNOSIS — Z959 Presence of cardiac and vascular implant and graft, unspecified: Secondary | ICD-10-CM

## 2019-07-19 DIAGNOSIS — Z20822 Contact with and (suspected) exposure to covid-19: Secondary | ICD-10-CM | POA: Diagnosis present

## 2019-07-19 LAB — TROPONIN I (HIGH SENSITIVITY)
Troponin I (High Sensitivity): 5 ng/L (ref <18)
Troponin I (High Sensitivity): 5 ng/L (ref <18)
Troponin I (High Sensitivity): 5 ng/L (ref <18)

## 2019-07-19 LAB — CBC
HCT: 39.9 % (ref 39.0–52.0)
Hemoglobin: 12.9 g/dL — ABNORMAL LOW (ref 13.0–17.0)
MCH: 32.9 pg (ref 26.0–34.0)
MCHC: 32.3 g/dL (ref 30.0–36.0)
MCV: 101.8 fL — ABNORMAL HIGH (ref 80.0–100.0)
Platelets: 278 10*3/uL (ref 150–400)
RBC: 3.92 MIL/uL — ABNORMAL LOW (ref 4.22–5.81)
RDW: 13.3 % (ref 11.5–15.5)
WBC: 7.8 10*3/uL (ref 4.0–10.5)
nRBC: 0 % (ref 0.0–0.2)

## 2019-07-19 LAB — BASIC METABOLIC PANEL
Anion gap: 10 (ref 5–15)
BUN: 8 mg/dL (ref 8–23)
CO2: 22 mmol/L (ref 22–32)
Calcium: 8.9 mg/dL (ref 8.9–10.3)
Chloride: 101 mmol/L (ref 98–111)
Creatinine, Ser: 0.8 mg/dL (ref 0.61–1.24)
GFR calc Af Amer: >60 mL/min (ref >60)
GFR calc non Af Amer: >60 mL/min (ref >60)
Glucose, Bld: 87 mg/dL (ref 70–99)
Potassium: 3.5 mmol/L (ref 3.5–5.1)
Sodium: 133 mmol/L — ABNORMAL LOW (ref 135–145)

## 2019-07-19 LAB — SARS CORONAVIRUS 2 BY RT PCR (HOSPITAL ORDER, PERFORMED IN ~~LOC~~ HOSPITAL LAB): SARS Coronavirus 2: NEGATIVE

## 2019-07-19 MED ORDER — ACETAMINOPHEN 500 MG PO TABS
500.0000 mg | ORAL_TABLET | Freq: Four times a day (QID) | ORAL | Status: DC | PRN
  Administered 2019-07-20: 500 mg via ORAL
  Filled 2019-07-19: qty 1

## 2019-07-19 MED ORDER — UMECLIDINIUM-VILANTEROL 62.5-25 MCG/INH IN AEPB
1.0000 | INHALATION_SPRAY | Freq: Every day | RESPIRATORY_TRACT | Status: DC
  Administered 2019-07-20 – 2019-07-21 (×2): 1 via RESPIRATORY_TRACT
  Filled 2019-07-19: qty 14

## 2019-07-19 MED ORDER — DIAZEPAM 5 MG PO TABS
5.0000 mg | ORAL_TABLET | Freq: Two times a day (BID) | ORAL | Status: DC | PRN
  Administered 2019-07-20: 5 mg via ORAL
  Filled 2019-07-19: qty 1

## 2019-07-19 MED ORDER — PANTOPRAZOLE SODIUM 40 MG PO TBEC
40.0000 mg | DELAYED_RELEASE_TABLET | Freq: Two times a day (BID) | ORAL | Status: DC
  Administered 2019-07-20 – 2019-07-21 (×3): 40 mg via ORAL
  Filled 2019-07-19 (×4): qty 1

## 2019-07-19 MED ORDER — VITAMIN B-12 1000 MCG PO TABS
2000.0000 ug | ORAL_TABLET | Freq: Every day | ORAL | Status: DC
  Administered 2019-07-20 – 2019-07-21 (×2): 2000 ug via ORAL
  Filled 2019-07-19 (×4): qty 2

## 2019-07-19 MED ORDER — ATORVASTATIN CALCIUM 80 MG PO TABS
80.0000 mg | ORAL_TABLET | Freq: Every evening | ORAL | Status: DC
  Administered 2019-07-19 – 2019-07-20 (×2): 80 mg via ORAL
  Filled 2019-07-19: qty 2
  Filled 2019-07-19: qty 1

## 2019-07-19 MED ORDER — HEPARIN SODIUM (PORCINE) 5000 UNIT/ML IJ SOLN
5000.0000 [IU] | Freq: Three times a day (TID) | INTRAMUSCULAR | Status: DC
  Administered 2019-07-19 – 2019-07-20 (×2): 5000 [IU] via SUBCUTANEOUS
  Filled 2019-07-19 (×2): qty 1

## 2019-07-19 MED ORDER — NITROGLYCERIN 0.4 MG SL SUBL: 0.4000 mg | SUBLINGUAL_TABLET | SUBLINGUAL | Status: DC | PRN

## 2019-07-19 MED ORDER — CLOPIDOGREL BISULFATE 75 MG PO TABS
75.0000 mg | ORAL_TABLET | Freq: Every day | ORAL | Status: DC
  Administered 2019-07-20: 75 mg via ORAL
  Filled 2019-07-19: qty 1

## 2019-07-19 MED ORDER — ASPIRIN 81 MG PO CHEW
81.0000 mg | CHEWABLE_TABLET | Freq: Every day | ORAL | Status: DC
  Administered 2019-07-20 – 2019-07-21 (×2): 81 mg via ORAL
  Filled 2019-07-19 (×2): qty 1

## 2019-07-19 MED ORDER — POTASSIUM CHLORIDE CRYS ER 20 MEQ PO TBCR
40.0000 meq | EXTENDED_RELEASE_TABLET | Freq: Once | ORAL | Status: AC
  Administered 2019-07-19: 40 meq via ORAL
  Filled 2019-07-19: qty 2

## 2019-07-19 MED ORDER — NITROGLYCERIN 0.4 MG SL SUBL
0.4000 mg | SUBLINGUAL_TABLET | Freq: Once | SUBLINGUAL | Status: AC
  Administered 2019-07-19: 0.4 mg via SUBLINGUAL
  Filled 2019-07-19: qty 1

## 2019-07-19 MED ORDER — ATENOLOL 25 MG PO TABS
50.0000 mg | ORAL_TABLET | Freq: Every day | ORAL | Status: DC
  Administered 2019-07-20: 50 mg via ORAL
  Filled 2019-07-19: qty 2

## 2019-07-19 MED ORDER — ONDANSETRON HCL 4 MG/2ML IJ SOLN: 4.0000 mg | Freq: Four times a day (QID) | INTRAMUSCULAR | Status: DC | PRN

## 2019-07-19 MED ORDER — ISOSORBIDE MONONITRATE ER 60 MG PO TB24: 30.0000 mg | ORAL_TABLET | Freq: Every day | ORAL | Status: DC

## 2019-07-19 MED ORDER — NITROGLYCERIN IN D5W 200-5 MCG/ML-% IV SOLN
0.0000 ug/min | INTRAVENOUS | Status: DC
  Administered 2019-07-19: 5 ug/min via INTRAVENOUS
  Filled 2019-07-19: qty 250

## 2019-07-19 MED ORDER — ALBUTEROL SULFATE HFA 108 (90 BASE) MCG/ACT IN AERS: 2.0000 | INHALATION_SPRAY | Freq: Four times a day (QID) | RESPIRATORY_TRACT | Status: DC | PRN

## 2019-07-19 MED ORDER — AMLODIPINE BESYLATE 10 MG PO TABS
10.0000 mg | ORAL_TABLET | Freq: Every day | ORAL | Status: DC
  Administered 2019-07-20: 10 mg via ORAL
  Filled 2019-07-19: qty 2

## 2019-07-19 NOTE — H&P (Signed)
TRH H&P   Patient Demographics:    Bill Johnson, is a 64 y.o. male  MRN: [id number]   DOB - 1955-11-22  Admit Date - 07/19/2019  Outpatient Primary MD for the patient is [name], Modena Nunnery, MD  Referring MD/NP/PA: PA Alroy Bailiff.  Patient coming from: Home  Chief Complaint  Patient presents with  . Chest Pain      HPI:    Bill Johnson  is a 64 y.o. male, hypertension, hyperlipidemia, alcohol abuse, depression, GERD, CAD, with recent stent placement x2 and ostial to mid LAD 07/05/2019, tobacco abuse, quit after recent stent placement, patient presents to ED secondary to complaints of pain, reports is intermittent, has been going over the last 4 days, progressive, initially with activity (after he was walking for 27 minutes), resolved with nitro, but reported has been become more frequent, more intense, and less responsive to nitro which prompted him to come to ED, reports it did happen today at rest, still resolved with nitro but it did recur again, currently he reports chest pain, pressure-like, nonradiating, denies any dyspnea, nausea or diaphoresis, denies fever, chills, cough or fever -In ED troponins were negative x2, EKG was nonacute, blood pressure has been controlled, ED discussed with cardiology, who recommended adjustment of his anginal medicine include increasing Norvasc, and adding Imdur, given known lesion in OM1 which was nonintervened upon given its known to supply small area, triage hospitalist were consulted to admit for further evaluation.    Review of systems:    In addition to the HPI above,  No Fever-chills, No Headache, No changes with Vision or hearing, No problems swallowing food or Liquids, Complains of chest pain, cough or Shortness of Breath, No Abdominal pain, No Nausea or Vommitting, Bowel movements are regular, No Blood in stool or Urine, No dysuria, No  new skin rashes or bruises, No new joints pains-aches,  No new weakness, tingling, numbness in any extremity, No recent weight gain or loss, No polyuria, polydypsia or polyphagia, No significant Mental Stressors.  A full 10 point Review of Systems was done, except as stated above, all other Review of Systems were negative.   With Past History of the following :    Past Medical History:  Diagnosis Date  . Alcohol abuse   . Allergy    seasonal  . Anxiety   . CAD (coronary artery disease)    a. 06/2019 cath - s/p successful PCI of the ostial to mid LAD with IVUS guidance and DES x 2.  . Depression   . Essential hypertension   . GERD (gastroesophageal reflux disease)   . Hyperlipidemia   . Hypertension   . OA (osteoarthritis) of knee   . Pulmonary nodules    a. seen on coronary CT 05/2019, will need OP f/u.  Marland Kitchen Rotator cuff tear arthropathy of both shoulders    On DISABILITY, non operative case  .  Shoulder pain   . Tobacco abuse   . Vertigo       Past Surgical History:  Procedure Laterality Date  . CARDIAC CATHETERIZATION  06/2005   no significant CAD  . COLONOSCOPY WITH PROPOFOL N/A 05/01/2019   Procedure: COLONOSCOPY WITH PROPOFOL;  Surgeon: Daneil Dolin, MD;  [facility];  Service: Endoscopy;  Laterality: N/A;  7:30am  . CORONARY STENT INTERVENTION N/A 07/05/2019   Procedure: CORONARY STENT INTERVENTION;  Surgeon: Martinique, Peter M, MD;  Location: [place] [facility];  Service: Cardiovascular;  Laterality: N/A;  . ESOPHAGOGASTRODUODENOSCOPY  08/2005   Dr. Henrene Pastor: small hiatal hernia  . HERNIA REPAIR  1983  . INTRAVASCULAR PRESSURE WIRE/FFR STUDY N/A 07/05/2019   Procedure: INTRAVASCULAR PRESSURE WIRE/FFR STUDY;  Surgeon: Martinique, Peter M, MD;  Location: [place] [facility];  Service: Cardiovascular;  Laterality: N/A;  . INTRAVASCULAR ULTRASOUND/IVUS N/A 07/05/2019   Procedure: Intravascular Ultrasound/IVUS;  Surgeon: Martinique, Peter M, MD;  Location: [place] [facility];   Service: Cardiovascular;  Laterality: N/A;  . LEFT HEART CATH AND CORONARY ANGIOGRAPHY N/A 07/05/2019   Procedure: LEFT HEART CATH AND CORONARY ANGIOGRAPHY;  Surgeon: Martinique, Peter M, MD;  Location: [place] [facility];  Service: Cardiovascular;  Laterality: N/A;  . POLYPECTOMY  05/01/2019   Procedure: POLYPECTOMY;  Surgeon: Daneil Dolin, MD;  Location: [facility];  Service: Endoscopy;;  . ROTATOR CUFF REPAIR Bilateral   . TONSILLECTOMY  1977      Social History:     Social History   Tobacco Use  . Smoking status: Current Every Day Smoker    Packs/day: 1.00    Years: 43.00    Pack years: 43.00    Types: Cigarettes    Start date: 04/15/1970  . Smokeless tobacco: Never Used  Substance Use Topics  . Alcohol use: Yes    Comment: Drinks 3-4 beers/day        Family History :     Family History  Problem Relation Age of Onset  . Heart disease Mother   . Diabetes Mother   . Hypertension Mother   . Heart disease Father   . Hypertension Father   . Stroke Other   . Cancer Other   . Cancer Brother        prostate  . Colon cancer Neg Hx       Home Medications:   Prior to Admission medications   Medication Sig Start Date End Date Taking? Authorizing Provider  acetaminophen (TYLENOL) 500 MG tablet Take 500 mg by mouth every 6 (six) hours as needed for mild pain or headache.    Yes [provider]  albuterol (PROVENTIL HFA;VENTOLIN HFA) 108 (90 Base) MCG/ACT inhaler Inhale 2 puffs into the lungs every 6 (six) hours as needed for wheezing or shortness of breath. 10/13/17  Yes Dena Billet B, PA-C  amLODipine (NORVASC) 5 MG tablet Take 1 tablet (5 mg total) by mouth daily. 05/03/19 05/02/20 Yes [place], Modena Nunnery, MD  aspirin 81 MG tablet Take 81 mg by mouth daily.   Yes [provider]  atenolol (TENORMIN) 50 MG tablet Take 1 tablet (50 mg total) by mouth daily. 05/03/19  Yes [place], Modena Nunnery, MD  atorvastatin (LIPITOR) 80 MG tablet Take 1 tablet (80 mg total) by mouth  every evening. 07/06/19  Yes Dunn, Dayna N, PA-C  clopidogrel (PLAVIX) 75 MG tablet Take 1 tablet (75 mg total) by mouth daily. 07/06/19  Yes Dunn, Nedra Hai, PA-C  [facility]  VITAMIN B-12 2000 MCG TBCR Take 2,000 mcg by mouth daily.   Yes [provider]  diazepam (VALIUM) 5 MG tablet Take 1 tablet (5 mg total) by mouth every 12 (twelve) hours as needed for anxiety. 06/16/19  Yes [place], Modena Nunnery, MD  nitroGLYCERIN (NITROSTAT) 0.4 MG SL tablet Place 1 tablet (0.4 mg total) under the tongue every 5 (five) minutes as needed for chest pain (up to 3 doses). 07/06/19 07/05/20 Yes Dunn, Dayna N, PA-C  pantoprazole (PROTONIX) 40 MG tablet Take 1 tablet (40 mg total) by mouth 2 (two) times daily. 03/27/19  Yes [place], Modena Nunnery, MD  umeclidinium-vilanterol [facility] ELLIPTA) 62.5-25 MCG/INH AEPB Inhale 1 puff into the lungs daily. 08/29/18  Yes , Modena Nunnery, MD  meclizine (ANTIVERT) 25 MG tablet TAKE 1 TABLET BY MOUTH THREE TIMES DAILY AS NEEDED FOR DIZZINESS Patient not taking: Reported on 07/19/2019 02/14/19   Alycia Rossetti, MD     Allergies:    No Known Allergies   Physical Exam:   Vitals  Blood pressure 115/79, pulse 74, temperature 98 F (36.7 C), temperature source Oral, resp. rate 17, height 5\' 4"  (1.626 m), weight 74.8 kg, SpO2 93 %.   1. General follow-up at male, laying in bed, no apparent distress, extremely hard of hearing, wearing hearing aids  2. Normal affect and insight, Not Suicidal or Homicidal, Awake Alert, Oriented X 3.  3. No F.N deficits, ALL C.Nerves Intact, Strength 5/5 all 4 extremities, Sensation intact all 4 extremities, Plantars down going.  4. Ears and Eyes appear Normal, Conjunctivae clear, PERRLA. Moist Oral Mucosa.  5. Supple Neck, No JVD, No cervical lymphadenopathy appriciated, No Carotid Bruits.  6. Symmetrical Chest wall movement, Good air movement bilaterally, CTAB.  7. RRR, No Gallops, Rubs or Murmurs, No Parasternal Heave.  8. Positive Bowel Sounds,  Abdomen Soft, No tenderness, No organomegaly appriciated,No rebound -guarding or rigidity.  9.  No Cyanosis, Normal Skin Turgor, No Skin Rash or Bruise.  10. Good muscle tone,  joints appear normal , no effusions, Normal ROM.  11. No Palpable Lymph Nodes in Neck or Axillae     Data Review:    CBC Recent Labs  Lab 07/19/19 1535  WBC 7.8  HGB 12.9*  HCT 39.9  PLT 278  MCV 101.8*  MCH 32.9  MCHC 32.3  RDW 13.3   ------------------------------------------------------------------------------------------------------------------  Chemistries  Recent Labs  Lab 07/19/19 1535  NA 133*  K 3.5  CL 101  CO2 22  GLUCOSE 87  BUN 8  CREATININE 0.80  CALCIUM 8.9   ------------------------------------------------------------------------------------------------------------------ estimated creatinine clearance is 85.2 mL/min (by C-G formula based on SCr of 0.8 mg/dL). ------------------------------------------------------------------------------------------------------------------ No results for input(s): TSH, T4TOTAL, T3FREE, THYROIDAB in the last 72 hours.  Invalid input(s): FREET3  Coagulation profile No results for input(s): INR, PROTIME in the last 168 hours. ------------------------------------------------------------------------------------------------------------------- No results for input(s): DDIMER in the last 72 hours. -------------------------------------------------------------------------------------------------------------------  Cardiac Enzymes No results for input(s): CKMB, TROPONINI, MYOGLOBIN in the last 168 hours.  Invalid input(s): CK ------------------------------------------------------------------------------------------------------------------ No results found for: BNP   ---------------------------------------------------------------------------------------------------------------  Urinalysis    Component Value Date/Time   COLORURINE YELLOW  05/19/2013 [place] 05/19/2013 0854   LABSPEC 1.010 05/19/2013 0854   PHURINE 5.0 05/19/2013 [place] 05/19/2013 0854   HGBUR NEGATIVE 05/19/2013 [place] NEGATIVE 05/19/2013 [place] 05/19/2013 0854   PROTEINUR NEGATIVE 05/19/2013 0854   UROBILINOGEN 0.2 05/19/2013 0854  NITRITE NEGATIVE 05/19/2013 [place] 05/19/2013 0854    ----------------------------------------------------------------------------------------------------------------   Imaging Results:    DG Chest 2 View  Result Date: 07/19/2019 CLINICAL DATA:  Chest pain. Additional provided: Patient began having chest pain on Sunday, 2 stents placed on the 9th of June. EXAM: CHEST - 2 VIEW COMPARISON:  CT chest 05/19/2019, chest radiograph 05/11/2019 FINDINGS: Heart size within normal limits. Aortic atherosclerosis. No appreciable airspace consolidation. Redemonstrated mild bibasilar pulmonary scarring. No frank pulmonary edema. No evidence of pleural effusion or pneumothorax. No acute bony abnormality identified. IMPRESSION: No evidence of acute cardiopulmonary abnormality. Redemonstrated mild scarring within the bilateral lung bases. Please refer to CT chest 05/19/2019 for a description of previously demonstrated pulmonary nodules and recommendations for follow-up. Aortic Atherosclerosis (ICD10-I70.0). Electronically Signed   By: Kellie Simmering DO   On: 07/19/2019 15:27    My personal review of EKG: Rhythm NSR, Rate  60 /min, [id number]   Assessment & Plan:    Active Problems:   Hypertension   COPD (chronic obstructive pulmonary disease) (HCC)   Hyperlipemia   CAD in native artery   Chest pain   Chest pain in the setting of recent PCI x2 the patient with known history of CAD. -No acute EKG changes, troponins negative x2, ACS ruled out . --She continues to have chest pain, which resolved by nitro in ED, chest pain remains persistent, so we will go  ahead and start on nitro drip and monitor him on stepdown . -Discussed with cardiology, and with residual significant OMI stenosis, could be contributing to his current chest pain, will await further cardiology recommendation tomorrow, his medication has been adjusted per recommendation, his Norvasc has been increased to 10 mg, and given persistent chest pain, will hold on started Imdur, and he will be started on nitro drip instead . -Reports he is compliant with aspirin, Plavix, atenolol, Norvasc and Lipitor, which we will continue during hospital stay.  Hypertension -Continue with atenolol, amlodipine has been increased to 10 mg, is currently on nitro drip.  COPD -No active wheezing, continue with home meds.  Reports he quit smoking after recent PCI intervention.  DVT Prophylaxis [place] Heparin   AM Labs Ordered, also please review Full Orders  Family Communication: Admission, patients condition and plan of care including tests being ordered have been discussed with the patient  who indicate understanding and agree with the plan and Code Status.  Code Status Full  Likely DC to  Home  Condition GUARDED    Consults called: D/W cardiolgy at [facility], [facility] cards consult in EPIC  Admission status: Observation    Time spent in minutes : 55 MINUTES   Phillips Climes M.D on 07/19/2019 at 8:07 PM   [facility] - Office  [phone]

## 2019-07-19 NOTE — ED Notes (Signed)
Pt to er room number one via wc, pt states that he had two stents placed a few weeks ago, states that he started having some chest pain on Sunday, states that he went to his cardiologist on Monday, states that they made an appointment for him on Friday, states that he stopped having pain Tuesday afternoon, states that today he started having some pain/pressure and took two nitro and denies pain at this time, cardiac monitor placed, pt appears to be in sinus brady

## 2019-07-19 NOTE — Progress Notes (Signed)
Ok to [employer] Imdur order since pt will be on NTG drip per Dr. Waldron [facility].  Onnie Boer, PharmD, BCIDP, AAHIVP, CPP Infectious Disease Pharmacist 07/19/2019 7:20 PM

## 2019-07-19 NOTE — ED Provider Notes (Addendum)
[facility] EMERGENCY DEPARTMENT Provider Note   CSN: [id number] Arrival date & time: 07/19/19  1452     History Chief Complaint  Patient presents with  . Chest Pain    Bill Johnson is a 64 y.o. male with PMHx HTN, HLD, alcohol abuse, depression, GERD, CAD with stenting placed to the ostial to mid LAD (07/05/19) who presents to the ED today with complaint of sudden onset, intermittent, stabbing, left sided chest pain that began 4 days ago.  Patient reports that on Sunday he had one episode of stabbing left-sided chest pain that quickly dissipated.  He went to his cardiologist on Monday and they saw him in the waiting room and scheduled a follow-up appointment on Friday however they were told if his pain continues that he needs to go to the ED for further evaluation.  Patient states that he did not have any pain Tuesday however this morning around 11 he had similar stabbing left-sided chest pain, he took 1 nitroglycerin with relief.  States he then had multiple episodes of left-sided chest pain approximately 3 hours later and took another nitroglycerin.  Patient is not having any active chest pain currently.  He denies any worsening shortness of breath associated with the chest pain.  No nausea, vomiting, diaphoresis.  Patient is on Plavix and states he has been compliant with all of his dosages.   The history is provided by the patient and medical records.   LHC 07/05/19   Prox LAD to Mid LAD lesion is 70% stenosed.  A drug-eluting stent was successfully placed using a STENT RESOLUTE ONYX 3.0X22.  Post intervention, there is a 0% residual stenosis.  Mid LAD lesion is 50% stenosed.  A drug-eluting stent was successfully placed using a STENT RESOLUTE ONYX 2.5X30.  Post intervention, there is a 0% residual stenosis.  1st Mrg lesion is 95% stenosed.  The left ventricular systolic function is normal.  LV end diastolic pressure is normal.  The left ventricular ejection fraction is  55-65% by visual estimate.  1. Severe 2 vessel obstructive CAD. Abnormal FFR of the LAD. 2. Normal LV function 3. Normal LVEDP 4. Successful PCI of the ostial to mid LAD with IVUS guidance and DES x 2.   Plan: DAPT for one year. Optimize medical therapy. Smoking cessation. If patient continues to have symptoms despite optimal therapy could consider PCI of the OM but this is a small branch and supplies a relatively small area of myocardium.   Antiplatelet/Anticoag Recommend uninterrupted dual antiplatelet therapy with Aspirin 81mg  daily and Clopidogrel 75mg  daily for a minimum of 12 months (ACS-Class I recommendation).       Past Medical History:  Diagnosis Date  . Alcohol abuse   . Allergy    seasonal  . Anxiety   . CAD (coronary artery disease)    a. 06/2019 cath - s/p successful PCI of the ostial to mid LAD with IVUS guidance and DES x 2.  . Depression   . Essential hypertension   . GERD (gastroesophageal reflux disease)   . Hyperlipidemia   . Hypertension   . OA (osteoarthritis) of knee   . Pulmonary nodules    a. seen on coronary CT 05/2019, will need OP f/u.  Marland Kitchen Rotator cuff tear arthropathy of both shoulders    On DISABILITY, non operative case  . Shoulder pain   . Tobacco abuse   . Vertigo     Patient Active Problem List   Diagnosis Date Noted  . CAD  in native artery 07/06/2019  . Mild anemia 07/06/2019  . LUQ pain 03/29/2019  . Alcohol abuse 03/27/2019  . Angina pectoris ([place]) 03/19/2019  . Hyperlipemia 07/26/2018  . GERD (gastroesophageal reflux disease) 06/24/2018  . OA (osteoarthritis) of shoulder 06/28/2017  . B12 deficiency 07/01/2016  . Hypertension 07/01/2016  . COPD (chronic obstructive pulmonary disease) ([place]) 07/01/2016  . Tobacco use 07/01/2016  . GAD (generalized anxiety disorder) 07/01/2016  . Chronic pain 07/01/2016  . Vertigo 11/28/2014  . Hearing loss 11/28/2014  . Tremor 11/28/2014    Past Surgical History:  Procedure Laterality Date   . CARDIAC CATHETERIZATION  06/2005   no significant CAD  . COLONOSCOPY WITH PROPOFOL N/A 05/01/2019   Procedure: COLONOSCOPY WITH PROPOFOL;  Surgeon: Daneil Dolin, MD;  [facility];  Service: Endoscopy;  Laterality: N/A;  7:30am  . CORONARY STENT INTERVENTION N/A 07/05/2019   Procedure: CORONARY STENT INTERVENTION;  Surgeon: Martinique, Peter M, MD;  Location: [place] [facility];  Service: Cardiovascular;  Laterality: N/A;  . ESOPHAGOGASTRODUODENOSCOPY  08/2005   Dr. Henrene Pastor: small hiatal hernia  . HERNIA REPAIR  1983  . INTRAVASCULAR PRESSURE WIRE/FFR STUDY N/A 07/05/2019   Procedure: INTRAVASCULAR PRESSURE WIRE/FFR STUDY;  Surgeon: Martinique, Peter M, MD;  Location: [place] [facility];  Service: Cardiovascular;  Laterality: N/A;  . INTRAVASCULAR ULTRASOUND/IVUS N/A 07/05/2019   Procedure: Intravascular Ultrasound/IVUS;  Surgeon: Martinique, Peter M, MD;  Location: [place] [facility];  Service: Cardiovascular;  Laterality: N/A;  . LEFT HEART CATH AND CORONARY ANGIOGRAPHY N/A 07/05/2019   Procedure: LEFT HEART CATH AND CORONARY ANGIOGRAPHY;  Surgeon: Martinique, Peter M, MD;  Location: [place] [facility];  Service: Cardiovascular;  Laterality: N/A;  . POLYPECTOMY  05/01/2019   Procedure: POLYPECTOMY;  Surgeon: Daneil Dolin, MD;  Location[facility];  Service: Endoscopy;;  . ROTATOR CUFF REPAIR Bilateral   . TONSILLECTOMY  1977       Family History  Problem Relation Age of Onset  . Heart disease Mother   . Diabetes Mother   . Hypertension Mother   . Heart disease Father   . Hypertension Father   . Stroke Other   . Cancer Other   . Cancer Brother        prostate  . Colon cancer Neg Hx     Social History   Tobacco Use  . Smoking status: Current Every Day Smoker    Packs/day: 1.00    Years: 43.00    Pack years: 43.00    Types: Cigarettes    Start date: 04/15/1970  . Smokeless tobacco: Never Used  Vaping Use  . Vaping Use: Never used  Substance Use Topics  . Alcohol use: Yes     Comment: Drinks 3-4 beers/day   . Drug use: No    Home Medications Prior to Admission medications   Medication Sig Start Date End Date Taking? Authorizing Provider  acetaminophen (TYLENOL) 500 MG tablet Take 500 mg by mouth every 6 (six) hours as needed for mild pain or headache.    Yes [provider]  albuterol (PROVENTIL HFA;VENTOLIN HFA) 108 (90 Base) MCG/ACT inhaler Inhale 2 puffs into the lungs every 6 (six) hours as needed for wheezing or shortness of breath. 10/13/17  Yes Dena Billet B, PA-C  amLODipine (NORVASC) 5 MG tablet Take 1 tablet (5 mg total) by mouth daily. 05/03/19 05/02/20 Yes [place], Modena Nunnery, MD  aspirin 81 MG tablet Take 81 mg by mouth daily.   Yes  [provider]  atenolol (TENORMIN) 50 MG tablet Take 1 tablet (50 mg total) by mouth daily. 05/03/19  Yes [place], Modena Nunnery, MD  atorvastatin (LIPITOR) 80 MG tablet Take 1 tablet (80 mg total) by mouth every evening. 07/06/19  Yes Dunn, Dayna N, PA-C  clopidogrel (PLAVIX) 75 MG tablet Take 1 tablet (75 mg total) by mouth daily. 07/06/19  Yes Dunn, Dayna N, PA-C  [employer] VITAMIN B-12 2000 MCG TBCR Take 2,000 mcg by mouth daily.   Yes [provider]  diazepam (VALIUM) 5 MG tablet Take 1 tablet (5 mg total) by mouth every 12 (twelve) hours as needed for anxiety. 06/16/19  Yes [place], Modena Nunnery, MD  nitroGLYCERIN (NITROSTAT) 0.4 MG SL tablet Place 1 tablet (0.4 mg total) under the tongue every 5 (five) minutes as needed for chest pain (up to 3 doses). 07/06/19 07/05/20 Yes Dunn, Dayna N, PA-C  pantoprazole (PROTONIX) 40 MG tablet Take 1 tablet (40 mg total) by mouth 2 (two) times daily. 03/27/19  Yes [place], Modena Nunnery, MD  umeclidinium-vilanterol [facility] ELLIPTA) 62.5-25 MCG/INH AEPB Inhale 1 puff into the lungs daily. 08/29/18  Yes [place], Modena Nunnery, MD  meclizine (ANTIVERT) 25 MG tablet TAKE 1 TABLET BY MOUTH THREE TIMES DAILY AS NEEDED FOR DIZZINESS Patient not taking: Reported on 07/19/2019 02/14/19   Alycia Rossetti,  MD    Allergies    Patient has no known allergies.  Review of Systems   Review of Systems  Constitutional: Negative for chills and fever.  Respiratory: Negative for cough and shortness of breath.   Cardiovascular: Positive for chest pain. Negative for palpitations and leg swelling.  Gastrointestinal: Negative for abdominal pain, nausea and vomiting.  All other systems reviewed and are negative.   Physical Exam Updated Vital Signs BP (!) 149/82 (BP Location: Right Arm)   Pulse (!) 52   Temp 98 F (36.7 C) (Oral)   Resp 18   Ht 5\' 4"  (1.626 m)   Wt 74.8 kg   SpO2 96%   BMI 28.32 kg/m   Physical Exam Vitals and nursing note reviewed.  Constitutional:      Appearance: He is not ill-appearing or diaphoretic.  HENT:     Head: Normocephalic and atraumatic.  Eyes:     Conjunctiva/sclera: Conjunctivae normal.  Cardiovascular:     Rate and Rhythm: Normal rate and regular rhythm.     Pulses:          Radial pulses are 2+ on the right side and 2+ on the left side.       Dorsalis pedis pulses are 2+ on the right side and 2+ on the left side.     Heart sounds: Normal heart sounds.  Pulmonary:     Effort: Pulmonary effort is normal.     Breath sounds: Normal breath sounds. No decreased breath sounds, wheezing, rhonchi or rales.  Chest:     Chest wall: No tenderness.  Abdominal:     Palpations: Abdomen is soft.     Tenderness: There is no abdominal tenderness. There is no guarding or rebound.  Musculoskeletal:     Cervical back: Neck supple.     Right lower leg: No tenderness. No edema.     Left lower leg: No tenderness. No edema.  Skin:    General: Skin is warm and dry.  Neurological:     Mental Status: He is alert.     ED Results / Procedures / Treatments   Labs (all labs ordered are  listed, but only abnormal results are displayed) Labs Reviewed  CBC - Abnormal; Notable for the following components:      Result Value   RBC 3.92 (*)    Hemoglobin 12.9 (*)    MCV  101.8 (*)    All other components within normal limits  BASIC METABOLIC PANEL - Abnormal; Notable for the following components:   Sodium 133 (*)    All other components within normal limits  SARS CORONAVIRUS 2 BY RT PCR (HOSPITAL ORDER, [place] [facility])  TROPONIN I (HIGH SENSITIVITY)  TROPONIN I (HIGH SENSITIVITY)    EKG EKG Interpretation  Date/Time:  Wednesday July 19 2019 16:03:17 EDT Ventricular Rate:  60 PR Interval:    QRS Duration: 95 QT Interval:  432 QTC Calculation: 432 R Axis:   68 Text Interpretation: Sinus rhythm Baseline wander in lead(s) V3 no acute ST/T changes similar to earlier in the day Confirmed by Sherwood Gambler [phone]) on 07/19/2019 4:19:34 PM   Radiology DG Chest 2 View  Result Date: 07/19/2019 CLINICAL DATA:  Chest pain. Additional provided: Patient began having chest pain on Sunday, 2 stents placed on the 9th of June. EXAM: CHEST - 2 VIEW COMPARISON:  CT chest 05/19/2019, chest radiograph 05/11/2019 FINDINGS: Heart size within normal limits. Aortic atherosclerosis. No appreciable airspace consolidation. Redemonstrated mild bibasilar pulmonary scarring. No frank pulmonary edema. No evidence of pleural effusion or pneumothorax. No acute bony abnormality identified. IMPRESSION: No evidence of acute cardiopulmonary abnormality. Redemonstrated mild scarring within the bilateral lung bases. Please refer to CT chest 05/19/2019 for a description of previously demonstrated pulmonary nodules and recommendations for follow-up. Aortic Atherosclerosis (ICD10-I70.0). Electronically Signed   By: Kyle  Golden DO   On: 07/19/2019 15:27    Procedures Procedures (including critical care time)  Medications Ordered in ED Medications  nitroGLYCERIN (NITROSTAT) SL tablet 0.4 mg (0.4 mg Sublingual Given 07/19/19 1711)    ED Course  I have reviewed the triage vital signs and the nursing notes.  Pertinent labs & imaging results that were available during  my care of the patient were reviewed by me and considered in my medical decision making (see chart for details).  Clinical Course as of Jul 19 1839  Wed Jul 19, 2019  1759 Imdur 30   [MV]    Clinical Course User Index [MV] Saree Krogh, PA-C   MDM Rules/Calculators/A&P                          64  year old male who presents to the ED today with intermittent sided chest pain x4 days.  Recently had stents placed in the LAD on 0609 after having anginal type pain with exertional dyspnea.  She has been doing well since discharge on the 10th however started having pain 4 days ago which concerned him.  He had 2 more episodes today prompting him to come to the ED.  On arrival to the ED patient is afebrile, nontachycardic nontachypneic.  He is not having any active chest pain currently.  KG without acute ischemic changes today.  Chest x-ray done in the waiting room, negative.  Initial troponin drawn while in the waiting room which is 5.  Plan for repeat.  Lab work otherwise reassuring.  Plan to discuss with cardiology given recent stenting for further recommendations at this time.   While in the ED patient began complaining of another episode of chest pain.  Nitroglycerin was given and pain resolved.  He states  he still has a mild pressure in his chest but it is no longer stabbing pain.   Repeat troponin 5.   I discussed case with cardiologist Dr. Ellyn Hack who has evaluated patient's previous cath as well as EKG and labs today.  It does appear patient had a vessel that was not stented which may be causing his symptoms today.  He recommends starting patient on 30 mg Imdur as well as increasing amlodipine at this time for symptomatic relief of his blood pressure.  Team will come and evaluate patient in the morning at [facility] and decide whether patient needs to be transferred to [facility] for possible repeat cardiac cath. Will consult hospitalist for admission at this time.   Discussed case with Dr.  Waldron Labs who agrees to evaluate patient for admission.   This note was prepared using Dragon voice recognition software and may include unintentional dictation errors due to the inherent limitations of voice recognition software.   Final Clinical Impression(s) / ED Diagnoses Final diagnoses:  Nonspecific chest pain  S/P arterial stent    Rx / DC Orders ED Discharge Orders    None          Eustaquio Maize, PA-C 07/19/19 1841    Sherwood Gambler, MD 07/19/19 2024

## 2019-07-19 NOTE — Progress Notes (Signed)
    Was called by Forestine Na, ER physician reference Mr. Allcock.  He recently underwent extensive PCI to the LAD on July 9.  The patient has now presented with a flow rate of 5-6 episodes lasting anywhere from seconds to minutes of anginal chest pain relieved with nitroglycerin.  Reviewing his Films, he had a great result on the LAD but there was a jailed diagonal branch which could be a source of angina, but he also has residual significant OM1 stenosis.  According to Dr. Doug Sou note,Could consider could consider PCI of the OM1 if symptoms warrant.  At this point we have been having multiple episodes of chest discomfort, I think it is reasonable to evaluate and titrate medications.  My recommendation was to place patient in observation status overnight at [facility], increase amlodipine to 10 mg daily and add Imdur 30 mg daily.  We will ask our [facility] cardiology colleagues to see the patient in consultation in the morning to determine if his symptoms do warrant transfer down to [facility] for staged PCI.   Glenetta Hew, MD

## 2019-07-19 NOTE — ED Triage Notes (Signed)
Pt began having CP on Sunday. He has 2 stents placed on the 9th of June. He was walking on Sunday when chest pain began. Today, he took 2 nitro with no relief.

## 2019-07-20 ENCOUNTER — Encounter (HOSPITAL_COMMUNITY)
Admission: EM | Disposition: A | Discharge status: Home / Self Care | Payer: Self-pay | Source: Home / Self Care | Attending: Cardiovascular Disease

## 2019-07-20 ENCOUNTER — Other Ambulatory Visit: Payer: Self-pay

## 2019-07-20 ENCOUNTER — Encounter (HOSPITAL_COMMUNITY): Payer: Self-pay | Admitting: Cardiovascular Disease

## 2019-07-20 DIAGNOSIS — E785 Hyperlipidemia, unspecified: Secondary | ICD-10-CM | POA: Diagnosis not present

## 2019-07-20 DIAGNOSIS — I200 Unstable angina: Secondary | ICD-10-CM | POA: Diagnosis not present

## 2019-07-20 DIAGNOSIS — Z87891 Personal history of nicotine dependence: Secondary | ICD-10-CM | POA: Diagnosis not present

## 2019-07-20 DIAGNOSIS — I10 Essential (primary) hypertension: Secondary | ICD-10-CM | POA: Diagnosis not present

## 2019-07-20 DIAGNOSIS — I25110 Atherosclerotic heart disease of native coronary artery with unstable angina pectoris: Secondary | ICD-10-CM

## 2019-07-20 HISTORY — PX: CORONARY STENT INTERVENTION: CATH118234

## 2019-07-20 HISTORY — PX: LEFT HEART CATH AND CORONARY ANGIOGRAPHY: CATH118249

## 2019-07-20 LAB — HIV ANTIBODY (ROUTINE TESTING W REFLEX): HIV Screen 4th Generation wRfx: NONREACTIVE

## 2019-07-20 LAB — MRSA PCR SCREENING: MRSA by PCR: NEGATIVE

## 2019-07-20 LAB — GLUCOSE, CAPILLARY: Glucose-Capillary: 122 mg/dL — ABNORMAL HIGH (ref 70–99)

## 2019-07-20 SURGERY — LEFT HEART CATH AND CORONARY ANGIOGRAPHY
Anesthesia: LOCAL

## 2019-07-20 MED ORDER — SODIUM CHLORIDE 0.9% FLUSH: 3.0000 mL | Freq: Two times a day (BID) | INTRAVENOUS | Status: DC

## 2019-07-20 MED ORDER — SODIUM CHLORIDE 0.9% FLUSH: 3.0000 mL | INTRAVENOUS | Status: DC | PRN

## 2019-07-20 MED ORDER — ONDANSETRON HCL 4 MG/2ML IJ SOLN: 4.0000 mg | Freq: Four times a day (QID) | INTRAMUSCULAR | Status: DC | PRN

## 2019-07-20 MED ORDER — LIDOCAINE HCL (PF) 1 % IJ SOLN
INTRAMUSCULAR | Status: DC | PRN
  Administered 2019-07-20: 2 mL via INTRADERMAL

## 2019-07-20 MED ORDER — HEPARIN (PORCINE) IN NACL 1000-0.9 UT/500ML-% IV SOLN
INTRAVENOUS | Status: DC | PRN
  Administered 2019-07-20 (×2): 500 mL

## 2019-07-20 MED ORDER — VERAPAMIL HCL 2.5 MG/ML IV SOLN
INTRAVENOUS | Status: DC | PRN
  Administered 2019-07-20: 10 mL via INTRA_ARTERIAL

## 2019-07-20 MED ORDER — ASPIRIN 81 MG PO CHEW: 81.0000 mg | CHEWABLE_TABLET | ORAL | Status: DC

## 2019-07-20 MED ORDER — HEPARIN (PORCINE) 25000 UT/250ML-% IV SOLN
900.0000 [IU]/h | INTRAVENOUS | Status: DC
  Administered 2019-07-20: 900 [IU]/h via INTRAVENOUS
  Filled 2019-07-20: qty 250

## 2019-07-20 MED ORDER — IOHEXOL 350 MG/ML SOLN
INTRAVENOUS | Status: DC | PRN
  Administered 2019-07-20: 160 mL via INTRA_ARTERIAL

## 2019-07-20 MED ORDER — MIDAZOLAM HCL 2 MG/2ML IJ SOLN
INTRAMUSCULAR | Status: DC | PRN
  Administered 2019-07-20: 1 mg via INTRAVENOUS
  Administered 2019-07-20: 2 mg via INTRAVENOUS

## 2019-07-20 MED ORDER — LABETALOL HCL 5 MG/ML IV SOLN: 10.0000 mg | INTRAVENOUS | Status: AC | PRN

## 2019-07-20 MED ORDER — SODIUM CHLORIDE 0.9 % IV SOLN: 250.0000 mL | INTRAVENOUS | Status: DC | PRN

## 2019-07-20 MED ORDER — HEPARIN (PORCINE) IN NACL 1000-0.9 UT/500ML-% IV SOLN
INTRAVENOUS | Status: AC
  Filled 2019-07-20: qty 1000

## 2019-07-20 MED ORDER — SODIUM CHLORIDE 0.9 % WEIGHT BASED INFUSION
3.0000 mL/kg/h | INTRAVENOUS | Status: DC
  Administered 2019-07-20: 3 mL/kg/h via INTRAVENOUS

## 2019-07-20 MED ORDER — ACETAMINOPHEN 325 MG PO TABS: 650.0000 mg | ORAL_TABLET | ORAL | Status: DC | PRN

## 2019-07-20 MED ORDER — SODIUM CHLORIDE 0.9 % IV SOLN: INTRAVENOUS | Status: AC

## 2019-07-20 MED ORDER — FENTANYL CITRATE (PF) 100 MCG/2ML IJ SOLN
INTRAMUSCULAR | Status: AC
  Filled 2019-07-20: qty 2

## 2019-07-20 MED ORDER — HEPARIN SODIUM (PORCINE) 1000 UNIT/ML IJ SOLN
INTRAMUSCULAR | Status: AC
  Filled 2019-07-20: qty 1

## 2019-07-20 MED ORDER — NITROGLYCERIN 1 MG/10 ML FOR IR/CATH LAB
INTRA_ARTERIAL | Status: DC | PRN
  Administered 2019-07-20 (×2): 200 ug via INTRACORONARY

## 2019-07-20 MED ORDER — NITROGLYCERIN 1 MG/10 ML FOR IR/CATH LAB
INTRA_ARTERIAL | Status: AC
  Filled 2019-07-20: qty 10

## 2019-07-20 MED ORDER — CHLORHEXIDINE GLUCONATE CLOTH 2 % EX PADS: 6.0000 | MEDICATED_PAD | Freq: Every day | CUTANEOUS | Status: DC

## 2019-07-20 MED ORDER — LIDOCAINE HCL (PF) 1 % IJ SOLN
INTRAMUSCULAR | Status: AC
  Filled 2019-07-20: qty 30

## 2019-07-20 MED ORDER — HYDRALAZINE HCL 20 MG/ML IJ SOLN: 10.0000 mg | INTRAMUSCULAR | Status: AC | PRN

## 2019-07-20 MED ORDER — ASPIRIN 81 MG PO CHEW: 81.0000 mg | CHEWABLE_TABLET | Freq: Every day | ORAL | Status: DC

## 2019-07-20 MED ORDER — HEPARIN SODIUM (PORCINE) 1000 UNIT/ML IJ SOLN
INTRAMUSCULAR | Status: DC | PRN
  Administered 2019-07-20: 3500 [IU] via INTRAVENOUS
  Administered 2019-07-20: 4000 [IU] via INTRAVENOUS

## 2019-07-20 MED ORDER — VERAPAMIL HCL 2.5 MG/ML IV SOLN
INTRAVENOUS | Status: AC
  Filled 2019-07-20: qty 2

## 2019-07-20 MED ORDER — SODIUM CHLORIDE 0.9% FLUSH
3.0000 mL | Freq: Two times a day (BID) | INTRAVENOUS | Status: DC
  Administered 2019-07-20 (×2): 3 mL via INTRAVENOUS

## 2019-07-20 MED ORDER — SODIUM CHLORIDE 0.9 % WEIGHT BASED INFUSION
1.0000 mL/kg/h | INTRAVENOUS | Status: DC
  Administered 2019-07-20: 1 mL/kg/h via INTRAVENOUS

## 2019-07-20 MED ORDER — CLOPIDOGREL BISULFATE 75 MG PO TABS
75.0000 mg | ORAL_TABLET | Freq: Every day | ORAL | Status: DC
  Administered 2019-07-21: 75 mg via ORAL
  Filled 2019-07-20: qty 1

## 2019-07-20 MED ORDER — FENTANYL CITRATE (PF) 100 MCG/2ML IJ SOLN
INTRAMUSCULAR | Status: DC | PRN
  Administered 2019-07-20 (×2): 25 ug via INTRAVENOUS

## 2019-07-20 MED ORDER — MIDAZOLAM HCL 2 MG/2ML IJ SOLN
INTRAMUSCULAR | Status: AC
  Filled 2019-07-20: qty 2

## 2019-07-20 MED ORDER — HEPARIN BOLUS VIA INFUSION
4000.0000 [IU] | Freq: Once | INTRAVENOUS | Status: AC
  Administered 2019-07-20: 4000 [IU] via INTRAVENOUS
  Filled 2019-07-20: qty 4000

## 2019-07-20 MED ORDER — ALBUTEROL SULFATE (2.5 MG/3ML) 0.083% IN NEBU: 2.5000 mg | INHALATION_SOLUTION | Freq: Four times a day (QID) | RESPIRATORY_TRACT | Status: DC | PRN

## 2019-07-20 SURGICAL SUPPLY — 20 items
BALLN SAPPHIRE 2.0X20 (BALLOONS) ×2
BALLN SAPPHIRE ~~LOC~~ 2.5X15 (BALLOONS) ×1 IMPLANT
BALLOON SAPPHIRE 2.0X20 (BALLOONS) IMPLANT
CATH 5FR JL3.5 JR4 ANG PIG MP (CATHETERS) ×1 IMPLANT
CATH INFINITI 5 FR 3DRC (CATHETERS) ×1 IMPLANT
CATH LAUNCHER 6FR EBU3.5 (CATHETERS) ×1 IMPLANT
DEVICE RAD COMP TR BAND LRG (VASCULAR PRODUCTS) ×1 IMPLANT
GLIDESHEATH SLEND SS 6F .021 (SHEATH) ×1 IMPLANT
GUIDEWIRE INQWIRE 1.5J.035X260 (WIRE) IMPLANT
INQWIRE 1.5J .035X260CM (WIRE) ×2
KIT ENCORE 26 ADVANTAGE (KITS) ×1 IMPLANT
KIT HEART LEFT (KITS) ×2 IMPLANT
KIT HEMO VALVE WATCHDOG (MISCELLANEOUS) ×1 IMPLANT
PACK CARDIAC CATHETERIZATION (CUSTOM PROCEDURE TRAY) ×2 IMPLANT
SHEATH PROBE COVER 6X72 (BAG) ×1 IMPLANT
STENT RESOLUTE ONYX 2.25X26 (Permanent Stent) ×1 IMPLANT
TRANSDUCER W/STOPCOCK (MISCELLANEOUS) ×2 IMPLANT
TUBING CIL FLEX 10 FLL-RA (TUBING) ×2 IMPLANT
WIRE ASAHI PROWATER 180CM (WIRE) ×1 IMPLANT
WIRE HI TORQ BMW 190CM (WIRE) ×1 IMPLANT

## 2019-07-20 NOTE — Care Management Obs Status (Signed)
[facility] NOTIFICATION   Patient Details  Name: Bill Johnson MRN: [id number] Date of Birth: Aug 22, 1955   Medicare Observation Status Notification Given:  Yes (letter was mailed to [place], [place], [place] [id number])    Tommy Medal 07/20/2019, 3:22 PM

## 2019-07-20 NOTE — Progress Notes (Signed)
Late entry  Questioned patient about most recent consumption of alcohol considering history of alcoholism. Patient reports the he has not consumed any alcohol since 07/05/2019.

## 2019-07-20 NOTE — H&P (View-Only) (Signed)
Cardiology Consult    Patient ID: ANTJUAN ROTHE; [id number]; [birth year]   Admit date: 07/19/2019 Date of Consult: 07/20/2019  Primary Care Provider: Alycia Rossetti, MD Primary Cardiologist: Kate Sable, MD   Patient Profile    ROBT OKUDA is a 64 y.o. male with past medical history of CAD (s/p DESx2 to LAD on 07/05/2019 with  residual  95% OM1 stenosis with consideration of PCI to OM if residual symptoms), HTN, HLD, tobacco use and prior alcohol use who is being seen today for the evaluation of chest pain at the request of Dr. Waldron Labs.   History of Present Illness    Mr. Benedick was recently admitted to [facility] from 6/9 - 07/06/2019 for a planned cardiac catheterization after recent Coronary CTA was suggestive of a flow-limiting lesion along the LAD. His catheterization showed 70% Prox-LAD stenosis followed by 50% mid-LAD which were treated with DESx2. He did have a residual 95% 1st Mrg stenosis and it was recommended if he continued to have symptoms despite optimal therapy could consider PCI of the OM but it was a small Cherylyn Sundby and supplied a relatively small area of myocardium, therefore medical therapy was initially recommended. He was started on ASA and Plavix with Atorvastatin being titrated from 40mg  daily to 80mg  daily. He did have pulmonary nodules noted on his recent CT with follow-up imaging recommended in 6-12 months.   He presented to [facility] ED yesterday afternoon for evaluation of chest pain which had started 3 days prior to admission. He reports overall feeling well following his recent catheterization. Was gradually increasing his activity and was walking for 27 minute increments without symptoms. Starting on Sunday, he started to developed dyspnea, fatigue and episodes of pressure along his left pectoral region which radiated into his shoulder. Symptoms improved with SL NTG but would return. He is now unable to walk to the mailbox without symptoms. He does  report he had chest discomfort waxing and waning throughout the night which was worse with being supine. Still having 2/10 chest discomfort this AM. He denies any recent orthopnea, PND or edema. He did quit smoking after his intervention and has also not consumed alcohol since his recent stent placement.    Initial labs show WBC 7.8, Hgb 12.9, platelets 278, Na+ 133, K+ 3.5 and creatinine 0.80. Initial and repeat HS Troponin values have been negative at 5. COVID negative. CXR shows no acute cardiopulmonary abnormalities. EKG shows sinus bradycardia, HR 58 with no acute ST abnormalities when compared to prior tracings.   His case was reviewed with Dr. Ellyn Hack who was on-call yesterday evening and his note mentions he had a jailed diagonal Labrina Lines following his recent PCI which could be the source of his angina but also has known residual OM stenosis. He recommended initiation of Imdur 30mg  daily and to titrate Amlodipine to 10mg  daily with evaluation by Cardiology to see if transfer to [facility] was warranted. He continued to have episodes of chest pain while in the ED, therefore Imdur was discontinued and he was started on IV NTG.    Past Medical History:  Diagnosis Date  . Alcohol abuse   . Allergy    seasonal  . Anxiety   . CAD (coronary artery disease)    a. 06/2019 cath - s/p successful PCI of the ostial to mid LAD with IVUS guidance and DES x 2.  . Depression   . Essential hypertension   . GERD (gastroesophageal reflux disease)   .  Hyperlipidemia   . Hypertension   . OA (osteoarthritis) of knee   . Pulmonary nodules    a. seen on coronary CT 05/2019, will need OP f/u.  Marland Kitchen Rotator cuff tear arthropathy of both shoulders    On DISABILITY, non operative case  . Shoulder pain   . Tobacco abuse   . Vertigo     Past Surgical History:  Procedure Laterality Date  . CARDIAC CATHETERIZATION  06/2005   no significant CAD  . COLONOSCOPY WITH PROPOFOL N/A 05/01/2019   Procedure: COLONOSCOPY WITH  PROPOFOL;  Surgeon: Daneil Dolin, MD;  [facility];  Service: Endoscopy;  Laterality: N/A;  7:30am  . CORONARY STENT INTERVENTION N/A 07/05/2019   Procedure: CORONARY STENT INTERVENTION;  Surgeon: Martinique, Peter M, MD;  Location: [place] [facility];  Service: Cardiovascular;  Laterality: N/A;  . ESOPHAGOGASTRODUODENOSCOPY  08/2005   Dr. Henrene Pastor: small hiatal hernia  . HERNIA REPAIR  1983  . INTRAVASCULAR PRESSURE WIRE/FFR STUDY N/A 07/05/2019   Procedure: INTRAVASCULAR PRESSURE WIRE/FFR STUDY;  Surgeon: Martinique, Peter M, MD;  Location: [place] [facility];  Service: Cardiovascular;  Laterality: N/A;  . INTRAVASCULAR ULTRASOUND/IVUS N/A 07/05/2019   Procedure: Intravascular Ultrasound/IVUS;  Surgeon: Martinique, Peter M, MD;  Location: [place] [facility];  Service: Cardiovascular;  Laterality: N/A;  . LEFT HEART CATH AND CORONARY ANGIOGRAPHY N/A 07/05/2019   Procedure: LEFT HEART CATH AND CORONARY ANGIOGRAPHY;  Surgeon: Martinique, Peter M, MD;  Location: [place] [facility];  Service: Cardiovascular;  Laterality: N/A;  . POLYPECTOMY  05/01/2019   Procedure: POLYPECTOMY;  Surgeon: Daneil Dolin, MD;  [facility];  Service: Endoscopy;;  . ROTATOR CUFF REPAIR Bilateral   . TONSILLECTOMY  1977     Home Medications:  Prior to Admission medications   Medication Sig Start Date End Date Taking? Authorizing Provider  acetaminophen (TYLENOL) 500 MG tablet Take 500 mg by mouth every 6 (six) hours as needed for mild pain or headache.    Yes [provider]  albuterol (PROVENTIL HFA;VENTOLIN HFA) 108 (90 Base) MCG/ACT inhaler Inhale 2 puffs into the lungs every 6 (six) hours as needed for wheezing or shortness of breath. 10/13/17  Yes Dena Billet B, PA-C  amLODipine (NORVASC) 5 MG tablet Take 1 tablet (5 mg total) by mouth daily. 05/03/19 05/02/20 Yes [place], Modena Nunnery, MD  aspirin 81 MG tablet Take 81 mg by mouth daily.   Yes [provider]  atenolol (TENORMIN) 50 MG tablet Take 1  tablet (50 mg total) by mouth daily. 05/03/19  Yes Schley, Modena Nunnery, MD  atorvastatin (LIPITOR) 80 MG tablet Take 1 tablet (80 mg total) by mouth every evening. 07/06/19  Yes Dunn, Dayna N, PA-C  clopidogrel (PLAVIX) 75 MG tablet Take 1 tablet (75 mg total) by mouth daily. 07/06/19  Yes Dunn, Dayna N, PA-C  [employer] VITAMIN B-12 2000 MCG TBCR Take 2,000 mcg by mouth daily.   Yes [provider]  diazepam (VALIUM) 5 MG tablet Take 1 tablet (5 mg total) by mouth every 12 (twelve) hours as needed for anxiety. 06/16/19  Yes Onaka, Modena Nunnery, MD  nitroGLYCERIN (NITROSTAT) 0.4 MG SL tablet Place 1 tablet (0.4 mg total) under the tongue every 5 (five) minutes as needed for chest pain (up to 3 doses). 07/06/19 07/05/20 Yes Dunn, Dayna N, PA-C  pantoprazole (PROTONIX) 40 MG tablet Take 1 tablet (40 mg total) by mouth 2 (two) times daily. 03/27/19  Yes [place], Modena Nunnery, MD  umeclidinium-vilanterol (ANORO ELLIPTA) 62.5-25 MCG/INH AEPB Inhale 1 puff into the lungs daily. 08/29/18  Yes [place], Modena Nunnery, MD  meclizine (ANTIVERT) 25 MG tablet TAKE 1 TABLET BY MOUTH THREE TIMES DAILY AS NEEDED FOR DIZZINESS Patient not taking: Reported on 07/19/2019 02/14/19   Alycia Rossetti, MD    Inpatient Medications: Scheduled Meds: . amLODipine  10 mg Oral Daily  . aspirin  81 mg Oral Daily  . atenolol  50 mg Oral Daily  . atorvastatin  80 mg Oral QPM  . Chlorhexidine Gluconate Cloth  6 each Topical Daily  . clopidogrel  75 mg Oral Daily  . heparin  5,000 Units Subcutaneous Q8H  . pantoprazole  40 mg Oral BID  . umeclidinium-vilanterol  1 puff Inhalation Daily  . vitamin B-12  2,000 mcg Oral Daily   Continuous Infusions: . nitroGLYCERIN 10 mcg/min (07/20/19 0600)   PRN Meds: acetaminophen, albuterol, diazepam, nitroGLYCERIN, ondansetron (ZOFRAN) IV  Allergies:   No Known Allergies  Social History:   Social History   Socioeconomic History  . Marital status: Single    Spouse name: Not on file  . Number of  children: 2  . Years of education: 2  . Highest education level: Not on file  Occupational History  . Occupation: Film/video editor Designer, television/film set)  Tobacco Use  . Smoking status: Current Every Day Smoker    Packs/day: 1.00    Years: 43.00    Pack years: 43.00    Types: Cigarettes    Start date: 04/15/1970  . Smokeless tobacco: Never Used  Vaping Use  . Vaping Use: Never used  Substance and Sexual Activity  . Alcohol use: Yes    Comment: Drinks 3-4 beers/day   . Drug use: No  . Sexual activity: Yes  Other Topics Concern  . Not on file  Social History Narrative   Lives at home with nephew and niece in-law   Caffeine use: Drinks coffee/soda every day   Social Determinants of Health   Financial Resource Strain:   . Difficulty of Paying Living Expenses:   Food Insecurity:   . Worried About Charity fundraiser in the Last Year:   . Arboriculturist in the Last Year:   Transportation Needs:   . Film/video editor (Medical):   Marland Kitchen Lack of Transportation (Non-Medical):   Physical Activity:   . Days of Exercise per Week:   . Minutes of Exercise per Session:   Stress:   . Feeling of Stress :   Social Connections:   . Frequency of Communication with Friends and Family:   . Frequency of Social Gatherings with Friends and Family:   . Attends Religious Services:   . Active Member of Clubs or Organizations:   . Attends Archivist Meetings:   Marland Kitchen Marital Status:   Intimate Partner Violence:   . Fear of Current or Ex-Partner:   . Emotionally Abused:   Marland Kitchen Physically Abused:   . Sexually Abused:      Family History:    Family History  Problem Relation Age of Onset  . Heart disease Mother   . Diabetes Mother   . Hypertension Mother   . Heart disease Father   . Hypertension Father   . Stroke Other   . Cancer Other   . Cancer Brother        prostate  . Colon cancer Neg Hx       Review of Systems    General:  No chills, fever, night  sweats or weight changes.    Cardiovascular:  No edema, orthopnea, palpitations, paroxysmal nocturnal dyspnea. Positive for chest pain and dyspnea on exertion.  Dermatological: No rash, lesions/masses Respiratory: No cough, dyspnea Urologic: No hematuria, dysuria Abdominal:   No nausea, vomiting, diarrhea, bright red blood per rectum, melena, or hematemesis Neurologic:  No visual changes, wkns, changes in mental status. All other systems reviewed and are otherwise negative except as noted above.  Physical Exam/Data    Vitals:   07/20/19 0645 07/20/19 0700 07/20/19 0713 07/20/19 0806  BP: 109/72 117/76    Pulse: 76 74 76   Resp: 15 15 15    Temp:   97.8 F (36.6 C)   TempSrc:   Oral   SpO2: 96% 98% 97% 95%  Weight:      Height:        Intake/Output Summary (Last 24 hours) at 07/20/2019 0915 Last data filed at 07/20/2019 0753 Gross per 24 hour  Intake 59.28 ml  Output 850 ml  Net -790.72 ml   Filed Weights   07/19/19 1502 07/20/19 0032  Weight: 74.8 kg 72.8 kg   Body mass index is 27.55 kg/m.   General: Pleasant male appearing in NAD Psych: Normal affect. Neuro: Alert and oriented X 3. Moves all extremities spontaneously. HEENT: Normal  Neck: Supple without bruits or JVD. Lungs:  Resp regular and unlabored, CTA without wheezing or rales. Heart: RRR no s3, s4, or murmurs. Abdomen: Soft, non-tender, non-distended, BS + x 4.  Extremities: No clubbing, cyanosis or edema. Radial site without evidence of a hematoma. DP/PT/Radials 2+ and equal bilaterally.   EKG:  The EKG was personally reviewed and demonstrates: Sinus bradycardia, HR 58 with no acute ST abnormalities when compared to prior tracings.   Telemetry:  Telemetry was personally reviewed and demonstrates: NSR, HR in 60's to 70's. No significant arrhythmias.    Labs/Studies     Relevant CV Studies:  Echocardiogram: 02/2019 IMPRESSIONS    1. Left ventricular ejection fraction, by estimation, is 55 to 60%. The  left ventricle has  normal function. The left ventricle has no regional  wall motion abnormalities. Left ventricular diastolic parameters were  normal.  2. Right ventricular systolic function is normal. The right ventricular  size is normal. Tricuspid regurgitation signal is inadequate for assessing  PA pressure.  3. Left atrial size was upper normal.  4. Right atrial size was upper normal.  5. The mitral valve is grossly normal. Trivial mitral valve  regurgitation.  6. The aortic valve is tricuspid. Aortic valve regurgitation is mild.  7. The inferior vena cava is normal in size with greater than 50%  respiratory variability, suggesting right atrial pressure of 3 mmHg.   Cardiac Catheterization: 06/2019  Prox LAD to Mid LAD lesion is 70% stenosed.  A drug-eluting stent was successfully placed using a STENT RESOLUTE ONYX 3.0X22.  Post intervention, there is a 0% residual stenosis.  Mid LAD lesion is 50% stenosed.  A drug-eluting stent was successfully placed using a STENT RESOLUTE ONYX 2.5X30.  Post intervention, there is a 0% residual stenosis.  1st Mrg lesion is 95% stenosed.  The left ventricular systolic function is normal.  LV end diastolic pressure is normal.  The left ventricular ejection fraction is 55-65% by visual estimate.   1. Severe 2 vessel obstructive CAD. Abnormal FFR of the LAD. 2. Normal LV function 3. Normal LVEDP 4. Successful PCI of the ostial to mid LAD with IVUS guidance and DES x 2.   Plan:  DAPT for one year. Optimize medical therapy. Smoking cessation. If patient continues to have symptoms despite optimal therapy could consider PCI of the OM but this is a small Chaley Castellanos and supplies a relatively small area of myocardium   Laboratory Data:  Chemistry Recent Labs  Lab 07/19/19 1535  NA 133*  K 3.5  CL 101  CO2 22  GLUCOSE 87  BUN 8  CREATININE 0.80  CALCIUM 8.9  GFRNONAA >60  GFRAA >60  ANIONGAP 10    No results for input(s): PROT, ALBUMIN, AST,  ALT, ALKPHOS, BILITOT in the last 168 hours. Hematology Recent Labs  Lab 07/19/19 1535  WBC 7.8  RBC 3.92*  HGB 12.9*  HCT 39.9  MCV 101.8*  MCH 32.9  MCHC 32.3  RDW 13.3  PLT 278   Cardiac EnzymesNo results for input(s): TROPONINI in the last 168 hours. No results for input(s): TROPIPOC in the last 168 hours.  BNPNo results for input(s): BNP, PROBNP in the last 168 hours.  DDimer No results for input(s): DDIMER in the last 168 hours.  Radiology/Studies:  DG Chest 2 View  Result Date: 07/19/2019 CLINICAL DATA:  Chest pain. Additional provided: Patient began having chest pain on Sunday, 2 stents placed on the 9th of June. EXAM: CHEST - 2 VIEW COMPARISON:  CT chest 05/19/2019, chest radiograph 05/11/2019 FINDINGS: Heart size within normal limits. Aortic atherosclerosis. No appreciable airspace consolidation. Redemonstrated mild bibasilar pulmonary scarring. No frank pulmonary edema. No evidence of pleural effusion or pneumothorax. No acute bony abnormality identified. IMPRESSION: No evidence of acute cardiopulmonary abnormality. Redemonstrated mild scarring within the bilateral lung bases. Please refer to CT chest 05/19/2019 for a description of previously demonstrated pulmonary nodules and recommendations for follow-up. Aortic Atherosclerosis (ICD10-I70.0). Electronically Signed   By: Kellie Simmering DO   On: 07/19/2019 15:27     Assessment & Plan    1. Chest Pain concerning for Accelerating Angina/CAD - He is s/p DESx2 to LAD on 07/05/2019 with residual 95% OM1 stenosis with consideration of PCI to OM if residual symptoms. He was doing well following stent placement and was walking for 30 minute intervals but developed recurrent anginal symptoms on Sunday. Now having chest pain and dyspnea with minimal activity.  -  Initial and repeat HS Troponin values have been negative at 5. EKG shows no acute ST abnormalities when compared to prior tracings. As mentioned by Dr. Ellyn Hack, symptoms could be  secondary to his jailed diagonal Shakiya Mcneary but given persistent symptoms and requirement for IV NTG, would anticipate a repeat catheterization this admission and consideration of PCI to the OM. Will review further with Dr. Harl Bowie.  - Continue current medication regimen with ASA 81mg  daily, Plavix 75mg  daily, Atenolol 50mg  daily and Atorvastatin 80mg  daily. Amlodipine was titrated to 10mg  daily on admission and he remains on IV NTG at this time.   2. HTN - BP initially elevated while in the ED, improved to 117/76 on most recent check. Continue with Amlodipine 10mg  daily, Atenolol 50mg  daily and IV NTG (currently at 10 mcg/min).  3. HLD - FLP last month showed LDL was elevated to 153 and Atorvastatin was titrated to 80mg  daily. He will need repeat FLP and LFT's next month.   4. Prior Tobacco Use and Alcohol Use - He quit consuming alcohol and quit smoking following his recent stent placement. Congratulated on this!  5. Anemia - Hgb stable at 12.9 on admission which is similar to prior values. Continue to follow given the indication for DAPT.  6. Pulmonary Nodules - recent CT showed several small pulmonary nodules with mean diameter of less than 5 mm with follow-up Chest CT recommended in 6-12 months.    For questions or updates, please contact [place] Please consult [email or web address] for contact info under Cardiology/STEMI.  Signed, Erma Heritage, PA-C 07/20/2019, 9:15 AM Pager: [phone]  Patient seen and discussed with PA Ahmed Prima, I agree with her documentation. 64 yo male history of CAD with recent stenting to LAD earlier this month, HTN, COPD, HL, EtOH use presents with chest pain.   Presents with chest pain. Initially felt better after stents earlier this month. Started noticing some recurrent chest pains with exertion, symptoms progressed to occur with lower levels of exertion and eventually at rest. Transient relief with SL NG.    WBC 7.8 Hgb 12.9 Plt 278 K 3.5 Cr 0.80    Trop 5-->5-->5 COVID neg CXR no acute process   06/2019 cath: prox LAD 70% FFR 0.79, mid LAD 50%, OM1 95%, RCA patent. S/p DES to prox LAD and DES to mid LAD. From notes if ongoing anginal symptoms could consider PCI of OM though small vessel.   02/2019 echo LVEF 55-60%, no WMAs  Presents with symptoms concenring for unstable angina. Recent cath with DES x 2 to LAD, had residual OM disease that was noted could be interveded on if neccesary, from Dr Allison Quarry note on call last night cath films also showed a jailed diag. Accelerating symptoms at home, improved but not resolved on higher norvasc dose on nitro drip overnight.  Medical therapy with ASA, norvasc 10, atenolol 50, atorva 80, plavix 75, nitro gtt. WOuld start hep gtt.    Will plan for repeat cath, arrange transfer to [facility] today.   I have reviewed the risks, indications, and alternatives to cardiac catheterization, possible angioplasty, and stenting with the patient  today. Risks include but are not limited to bleeding, infection, vascular injury, stroke, myocardial infection, arrhythmia, kidney injury, radiation-related injury in the case of prolonged fluoroscopy use, emergency cardiac surgery, and death. The patient understands the risks of serious complication is 1-2 in [id number] with diagnostic cardiac cath and 1-2% or less with angioplasty/stenting.   Carlyle Dolly MD

## 2019-07-20 NOTE — Consult Note (Addendum)
Cardiology Consult    Patient ID: Bill Johnson; [id number]; 1955/10/27   Admit date: 07/19/2019 Date of Consult: 07/20/2019  Primary Care Provider: Alycia Rossetti, MD Primary Cardiologist: Kate Sable, MD   Patient Profile    Bill Johnson is a 64 y.o. male with past medical history of CAD (s/p DESx2 to LAD on 07/05/2019 with  residual  95% OM1 stenosis with consideration of PCI to OM if residual symptoms), HTN, HLD, tobacco use and prior alcohol use who is being seen today for the evaluation of chest pain at the request of Dr. Waldron Labs.   History of Present Illness    Bill Johnson was recently admitted to [facility] from 6/9 - 07/06/2019 for a planned cardiac catheterization after recent Coronary CTA was suggestive of a flow-limiting lesion along the LAD. His catheterization showed 70% Prox-LAD stenosis followed by 50% mid-LAD which were treated with DESx2. He did have a residual 95% 1st Mrg stenosis and it was recommended if he continued to have symptoms despite optimal therapy could consider PCI of the OM but it was a small Bill Johnson and supplied a relatively small area of myocardium, therefore medical therapy was initially recommended. He was started on ASA and Plavix with Atorvastatin being titrated from 40mg  daily to 80mg  daily. He did have pulmonary nodules noted on his recent CT with follow-up imaging recommended in 6-12 months.   He presented to [facility] ED yesterday afternoon for evaluation of chest pain which had started 3 days prior to admission. He reports overall feeling well following his recent catheterization. Was gradually increasing his activity and was walking for 27 minute increments without symptoms. Starting on Sunday, he started to developed dyspnea, fatigue and episodes of pressure along his left pectoral region which radiated into his shoulder. Symptoms improved with SL NTG but would return. He is now unable to walk to the mailbox without symptoms. He does  report he had chest discomfort waxing and waning throughout the night which was worse with being supine. Still having 2/10 chest discomfort this AM. He denies any recent orthopnea, PND or edema. He did quit smoking after his intervention and has also not consumed alcohol since his recent stent placement.    Initial labs show WBC 7.8, Hgb 12.9, platelets 278, Na+ 133, K+ 3.5 and creatinine 0.80. Initial and repeat HS Troponin values have been negative at 5. COVID negative. CXR shows no acute cardiopulmonary abnormalities. EKG shows sinus bradycardia, HR 58 with no acute ST abnormalities when compared to prior tracings.   His case was reviewed with Dr. Ellyn Hack who was on-call yesterday evening and his note mentions he had a jailed diagonal Bill Johnson following his recent PCI which could be the source of his angina but also has known residual OM stenosis. He recommended initiation of Imdur 30mg  daily and to titrate Amlodipine to 10mg  daily with evaluation by Cardiology to see if transfer to [facility] was warranted. He continued to have episodes of chest pain while in the ED, therefore Imdur was discontinued and he was started on IV NTG.    Past Medical History:  Diagnosis Date  . Alcohol abuse   . Allergy    seasonal  . Anxiety   . CAD (coronary artery disease)    a. 06/2019 cath - s/p successful PCI of the ostial to mid LAD with IVUS guidance and DES x 2.  . Depression   . Essential hypertension   . GERD (gastroesophageal reflux disease)   .  Hyperlipidemia   . Hypertension   . OA (osteoarthritis) of knee   . Pulmonary nodules    a. seen on coronary CT 05/2019, will need OP f/u.  Marland Kitchen Rotator cuff tear arthropathy of both shoulders    On DISABILITY, non operative case  . Shoulder pain   . Tobacco abuse   . Vertigo     Past Surgical History:  Procedure Laterality Date  . CARDIAC CATHETERIZATION  06/2005   no significant CAD  . COLONOSCOPY WITH PROPOFOL N/A 05/01/2019   Procedure: COLONOSCOPY WITH  PROPOFOL;  Surgeon: Daneil Dolin, MD;  [facility];  Service: Endoscopy;  Laterality: N/A;  7:30am  . CORONARY STENT INTERVENTION N/A 07/05/2019   Procedure: CORONARY STENT INTERVENTION;  Surgeon: Martinique, Peter M, MD;  Location: [place] CV LAB;  Service: Cardiovascular;  Laterality: N/A;  . ESOPHAGOGASTRODUODENOSCOPY  08/2005   Dr. Henrene Pastor: small hiatal hernia  . HERNIA REPAIR  1983  . INTRAVASCULAR PRESSURE WIRE/FFR STUDY N/A 07/05/2019   Procedure: INTRAVASCULAR PRESSURE WIRE/FFR STUDY;  Surgeon: Martinique, Peter M, MD;  Location: [place] CV LAB;  Service: Cardiovascular;  Laterality: N/A;  . INTRAVASCULAR ULTRASOUND/IVUS N/A 07/05/2019   Procedure: Intravascular Ultrasound/IVUS;  Surgeon: Martinique, Peter M, MD;  Location: [place] CV LAB;  Service: Cardiovascular;  Laterality: N/A;  . LEFT HEART CATH AND CORONARY ANGIOGRAPHY N/A 07/05/2019   Procedure: LEFT HEART CATH AND CORONARY ANGIOGRAPHY;  Surgeon: Martinique, Peter M, MD;  Location: [place] CV LAB;  Service: Cardiovascular;  Laterality: N/A;  . POLYPECTOMY  05/01/2019   Procedure: POLYPECTOMY;  Surgeon: Daneil Dolin, MD;  [facility];  Service: Endoscopy;;  . ROTATOR CUFF REPAIR Bilateral   . TONSILLECTOMY  1977     Home Medications:  Prior to Admission medications   Medication Sig Start Date End Date Taking? Authorizing Provider  acetaminophen (TYLENOL) 500 MG tablet Take 500 mg by mouth every 6 (six) hours as needed for mild pain or headache.    Yes [provider]  albuterol (PROVENTIL HFA;VENTOLIN HFA) 108 (90 Base) MCG/ACT inhaler Inhale 2 puffs into the lungs every 6 (six) hours as needed for wheezing or shortness of breath. 10/13/17  Yes Dena Billet B, PA-C  amLODipine (NORVASC) 5 MG tablet Take 1 tablet (5 mg total) by mouth daily. 05/03/19 05/02/20 Yes [place], Modena Nunnery, MD  aspirin 81 MG tablet Take 81 mg by mouth daily.   Yes [provider]  atenolol (TENORMIN) 50 MG tablet Take 1  tablet (50 mg total) by mouth daily. 05/03/19  Yes [place], Modena Nunnery, MD  atorvastatin (LIPITOR) 80 MG tablet Take 1 tablet (80 mg total) by mouth every evening. 07/06/19  Yes Dunn, Dayna N, PA-C  clopidogrel (PLAVIX) 75 MG tablet Take 1 tablet (75 mg total) by mouth daily. 07/06/19  Yes Dunn, Dayna N, PA-C  [employer] VITAMIN B-12 2000 MCG TBCR Take 2,000 mcg by mouth daily.   Yes [provider]  diazepam (VALIUM) 5 MG tablet Take 1 tablet (5 mg total) by mouth every 12 (twelve) hours as needed for anxiety. 06/16/19  Yes [place], Modena Nunnery, MD  nitroGLYCERIN (NITROSTAT) 0.4 MG SL tablet Place 1 tablet (0.4 mg total) under the tongue every 5 (five) minutes as needed for chest pain (up to 3 doses). 07/06/19 07/05/20 Yes Dunn, Dayna N, PA-C  pantoprazole (PROTONIX) 40 MG tablet Take 1 tablet (40 mg total) by mouth 2 (two) times daily. 03/27/19  Yes [place], Modena Nunnery, MD  umeclidinium-vilanterol (ANORO ELLIPTA) 62.5-25 MCG/INH AEPB Inhale 1 puff into the lungs daily. 08/29/18  Yes [place], Modena Nunnery, MD  meclizine (ANTIVERT) 25 MG tablet TAKE 1 TABLET BY MOUTH THREE TIMES DAILY AS NEEDED FOR DIZZINESS Patient not taking: Reported on 07/19/2019 02/14/19   Alycia Rossetti, MD    Inpatient Medications: Scheduled Meds: . amLODipine  10 mg Oral Daily  . aspirin  81 mg Oral Daily  . atenolol  50 mg Oral Daily  . atorvastatin  80 mg Oral QPM  . Chlorhexidine Gluconate Cloth  6 each Topical Daily  . clopidogrel  75 mg Oral Daily  . heparin  5,000 Units Subcutaneous Q8H  . pantoprazole  40 mg Oral BID  . umeclidinium-vilanterol  1 puff Inhalation Daily  . vitamin B-12  2,000 mcg Oral Daily   Continuous Infusions: . nitroGLYCERIN 10 mcg/min (07/20/19 0600)   PRN Meds: acetaminophen, albuterol, diazepam, nitroGLYCERIN, ondansetron (ZOFRAN) IV  Allergies:   No Known Allergies  Social History:   Social History   Socioeconomic History  . Marital status: Single    Spouse name: Not on file  . Number of  children: 2  . Years of education: 38  . Highest education level: Not on file  Occupational History  . Occupation: Film/video editor Designer, television/film set)  Tobacco Use  . Smoking status: Current Every Day Smoker    Packs/day: 1.00    Years: 43.00    Pack years: 43.00    Types: Cigarettes    Start date: 04/15/1970  . Smokeless tobacco: Never Used  Vaping Use  . Vaping Use: Never used  Substance and Sexual Activity  . Alcohol use: Yes    Comment: Drinks 3-4 beers/day   . Drug use: No  . Sexual activity: Yes  Other Topics Concern  . Not on file  Social History Narrative   Lives at home with nephew and niece in-law   Caffeine use: Drinks coffee/soda every day   Social Determinants of Health   Financial Resource Strain:   . Difficulty of Paying Living Expenses:   Food Insecurity:   . Worried About Charity fundraiser in the Last Year:   . Arboriculturist in the Last Year:   Transportation Needs:   . Film/video editor (Medical):   Marland Kitchen Lack of Transportation (Non-Medical):   Physical Activity:   . Days of Exercise per Week:   . Minutes of Exercise per Session:   Stress:   . Feeling of Stress :   Social Connections:   . Frequency of Communication with Friends and Family:   . Frequency of Social Gatherings with Friends and Family:   . Attends Religious Services:   . Active Member of Clubs or Organizations:   . Attends Archivist Meetings:   Marland Kitchen Marital Status:   Intimate Partner Violence:   . Fear of Current or Ex-Partner:   . Emotionally Abused:   Marland Kitchen Physically Abused:   . Sexually Abused:      Family History:    Family History  Problem Relation Age of Onset  . Heart disease Mother   . Diabetes Mother   . Hypertension Mother   . Heart disease Father   . Hypertension Father   . Stroke Other   . Cancer Other   . Cancer Brother        prostate  . Colon cancer Neg Hx       Review of Systems    General:  No chills, fever, night  sweats or weight changes.    Cardiovascular:  No edema, orthopnea, palpitations, paroxysmal nocturnal dyspnea. Positive for chest pain and dyspnea on exertion.  Dermatological: No rash, lesions/masses Respiratory: No cough, dyspnea Urologic: No hematuria, dysuria Abdominal:   No nausea, vomiting, diarrhea, bright red blood per rectum, melena, or hematemesis Neurologic:  No visual changes, wkns, changes in mental status. All other systems reviewed and are otherwise negative except as noted above.  Physical Exam/Data    Vitals:   07/20/19 0645 07/20/19 0700 07/20/19 0713 07/20/19 0806  BP: 109/72 117/76    Pulse: 76 74 76   Resp: 15 15 15    Temp:   97.8 F (36.6 C)   TempSrc:   Oral   SpO2: 96% 98% 97% 95%  Weight:      Height:        Intake/Output Summary (Last 24 hours) at 07/20/2019 0915 Last data filed at 07/20/2019 0753 Gross per 24 hour  Intake 59.28 ml  Output 850 ml  Net -790.72 ml   Filed Weights   07/19/19 1502 07/20/19 0032  Weight: 74.8 kg 72.8 kg   Body mass index is 27.55 kg/m.   General: Pleasant male appearing in NAD Psych: Normal affect. Neuro: Alert and oriented X 3. Moves all extremities spontaneously. HEENT: Normal  Neck: Supple without bruits or JVD. Lungs:  Resp regular and unlabored, CTA without wheezing or rales. Heart: RRR no s3, s4, or murmurs. Abdomen: Soft, non-tender, non-distended, BS + x 4.  Extremities: No clubbing, cyanosis or edema. Radial site without evidence of a hematoma. DP/PT/Radials 2+ and equal bilaterally.   EKG:  The EKG was personally reviewed and demonstrates: Sinus bradycardia, HR 58 with no acute ST abnormalities when compared to prior tracings.   Telemetry:  Telemetry was personally reviewed and demonstrates: NSR, HR in 60's to 70's. No significant arrhythmias.    Labs/Studies     Relevant CV Studies:  Echocardiogram: 02/2019 IMPRESSIONS    1. Left ventricular ejection fraction, by estimation, is 55 to 60%. The  left ventricle has  normal function. The left ventricle has no regional  wall motion abnormalities. Left ventricular diastolic parameters were  normal.  2. Right ventricular systolic function is normal. The right ventricular  size is normal. Tricuspid regurgitation signal is inadequate for assessing  PA pressure.  3. Left atrial size was upper normal.  4. Right atrial size was upper normal.  5. The mitral valve is grossly normal. Trivial mitral valve  regurgitation.  6. The aortic valve is tricuspid. Aortic valve regurgitation is mild.  7. The inferior vena cava is normal in size with greater than 50%  respiratory variability, suggesting right atrial pressure of 3 mmHg.   Cardiac Catheterization: 06/2019  Prox LAD to Mid LAD lesion is 70% stenosed.  A drug-eluting stent was successfully placed using a STENT RESOLUTE ONYX 3.0X22.  Post intervention, there is a 0% residual stenosis.  Mid LAD lesion is 50% stenosed.  A drug-eluting stent was successfully placed using a STENT RESOLUTE ONYX 2.5X30.  Post intervention, there is a 0% residual stenosis.  1st Mrg lesion is 95% stenosed.  The left ventricular systolic function is normal.  LV end diastolic pressure is normal.  The left ventricular ejection fraction is 55-65% by visual estimate.   1. Severe 2 vessel obstructive CAD. Abnormal FFR of the LAD. 2. Normal LV function 3. Normal LVEDP 4. Successful PCI of the ostial to mid LAD with IVUS guidance and DES x 2.   Plan:  DAPT for one year. Optimize medical therapy. Smoking cessation. If patient continues to have symptoms despite optimal therapy could consider PCI of the OM but this is a small Cristianna Cyr and supplies a relatively small area of myocardium   Laboratory Data:  Chemistry Recent Labs  Lab 07/19/19 1535  NA 133*  K 3.5  CL 101  CO2 22  GLUCOSE 87  BUN 8  CREATININE 0.80  CALCIUM 8.9  GFRNONAA >60  GFRAA >60  ANIONGAP 10    No results for input(s): PROT, ALBUMIN, AST,  ALT, ALKPHOS, BILITOT in the last 168 hours. Hematology Recent Labs  Lab 07/19/19 1535  WBC 7.8  RBC 3.92*  HGB 12.9*  HCT 39.9  MCV 101.8*  MCH 32.9  MCHC 32.3  RDW 13.3  PLT 278   Cardiac EnzymesNo results for input(s): TROPONINI in the last 168 hours. No results for input(s): TROPIPOC in the last 168 hours.  BNPNo results for input(s): BNP, PROBNP in the last 168 hours.  DDimer No results for input(s): DDIMER in the last 168 hours.  Radiology/Studies:  DG Chest 2 View  Result Date: 07/19/2019 CLINICAL DATA:  Chest pain. Additional provided: Patient began having chest pain on Sunday, 2 stents placed on the 9th of June. EXAM: CHEST - 2 VIEW COMPARISON:  CT chest 05/19/2019, chest radiograph 05/11/2019 FINDINGS: Heart size within normal limits. Aortic atherosclerosis. No appreciable airspace consolidation. Redemonstrated mild bibasilar pulmonary scarring. No frank pulmonary edema. No evidence of pleural effusion or pneumothorax. No acute bony abnormality identified. IMPRESSION: No evidence of acute cardiopulmonary abnormality. Redemonstrated mild scarring within the bilateral lung bases. Please refer to CT chest 05/19/2019 for a description of previously demonstrated pulmonary nodules and recommendations for follow-up. Aortic Atherosclerosis (ICD10-I70.0). Electronically Signed   By: Kellie Simmering DO   On: 07/19/2019 15:27     Assessment & Plan    1. Chest Pain concerning for Accelerating Angina/CAD - He is s/p DESx2 to LAD on 07/05/2019 with residual 95% OM1 stenosis with consideration of PCI to OM if residual symptoms. He was doing well following stent placement and was walking for 30 minute intervals but developed recurrent anginal symptoms on Sunday. Now having chest pain and dyspnea with minimal activity.  -  Initial and repeat HS Troponin values have been negative at 5. EKG shows no acute ST abnormalities when compared to prior tracings. As mentioned by Dr. Ellyn Hack, symptoms could be  secondary to his jailed diagonal Cayle Cordoba but given persistent symptoms and requirement for IV NTG, would anticipate a repeat catheterization this admission and consideration of PCI to the OM. Will review further with Dr. Harl Bowie.  - Continue current medication regimen with ASA 81mg  daily, Plavix 75mg  daily, Atenolol 50mg  daily and Atorvastatin 80mg  daily. Amlodipine was titrated to 10mg  daily on admission and he remains on IV NTG at this time.   2. HTN - BP initially elevated while in the ED, improved to 117/76 on most recent check. Continue with Amlodipine 10mg  daily, Atenolol 50mg  daily and IV NTG (currently at 10 mcg/min).  3. HLD - FLP last month showed LDL was elevated to 153 and Atorvastatin was titrated to 80mg  daily. He will need repeat FLP and LFT's next month.   4. Prior Tobacco Use and Alcohol Use - He quit consuming alcohol and quit smoking following his recent stent placement. Congratulated on this!  5. Anemia - Hgb stable at 12.9 on admission which is similar to prior values. Continue to follow given the indication for DAPT.  6. Pulmonary Nodules - recent CT showed several small pulmonary nodules with mean diameter of less than 5 mm with follow-up Chest CT recommended in 6-12 months.    For questions or updates, please contact [place] Please consult [email or web address] for contact info under Cardiology/STEMI.  Signed, Erma Heritage, PA-C 07/20/2019, 9:15 AM Pager: [phone]  Patient seen and discussed with PA Ahmed Prima, I agree with her documentation. 64 yo male history of CAD with recent stenting to LAD earlier this month, HTN, COPD, HL, EtOH use presents with chest pain.   Presents with chest pain. Initially felt better after stents earlier this month. Started noticing some recurrent chest pains with exertion, symptoms progressed to occur with lower levels of exertion and eventually at rest. Transient relief with SL NG.    WBC 7.8 Hgb 12.9 Plt 278 K 3.5 Cr 0.80    Trop 5-->5-->5 COVID neg CXR no acute process   06/2019 cath: prox LAD 70% FFR 0.79, mid LAD 50%, OM1 95%, RCA patent. S/p DES to prox LAD and DES to mid LAD. From notes if ongoing anginal symptoms could consider PCI of OM though small vessel.   02/2019 echo LVEF 55-60%, no WMAs  Presents with symptoms concenring for unstable angina. Recent cath with DES x 2 to LAD, had residual OM disease that was noted could be interveded on if neccesary, from Dr Allison Quarry note on call last night cath films also showed a jailed diag. Accelerating symptoms at home, improved but not resolved on higher norvasc dose on nitro drip overnight.  Medical therapy with ASA, norvasc 10, atenolol 50, atorva 80, plavix 75, nitro gtt. WOuld start hep gtt.    Will plan for repeat cath, arrange transfer to [facility] today.   I have reviewed the risks, indications, and alternatives to cardiac catheterization, possible angioplasty, and stenting with the patient  today. Risks include but are not limited to bleeding, infection, vascular injury, stroke, myocardial infection, arrhythmia, kidney injury, radiation-related injury in the case of prolonged fluoroscopy use, emergency cardiac surgery, and death. The patient understands the risks of serious complication is 1-2 in 1771 with diagnostic cardiac cath and 1-2% or less with angioplasty/stenting.   Carlyle Dolly MD

## 2019-07-20 NOTE — Progress Notes (Deleted)
Cardiology Office Note  Date: 07/20/2019   ID: Kayvon, Mo Apr 16, 1955, MRN [id number]  PCP:  Alycia Rossetti, MD  Cardiologist:  Kate Sable, MD Electrophysiologist:  None   Chief Complaint: ***  History of Present Illness: Bill Johnson is a 64 y.o. male with a history of chest pain, smoking, GERD, HLD, alcohol abuse.  Last encounter with me on 06/29/2019 he admitted experiencing exertional fatigue with intermittent left-sided chest pains occurring when up walking up hill,  using a chainsaw, or other exertional activities. Had a recent Cardiac CT demonstrating a possible flow limiting lesion in mLAD and prx. superior branch of OM. A cardiac catheterization was scheduled for June 9 with Dr Martinique. Cardiac cath 07/05/2019 with DES x 2 to the ostial to mid LAD.  He presented to [facility] ED 07/19/2019 with recurrent chest pain on and off  onset 4 days prior to presentation. On Sunday prior to presentation had one episode of stabbing left chest pain which quickly resolved. On 07/19/2019 around 1100 he had a similar pain and had relief with NTG. Multiple episodes approximately 3 hours later and took another NTG. While in ED patient had another episode of chest pain which resolved with NTG. Initially pain thought possibly d/t jailed diagonal from previous stenting to LAD.   He was transferred to [facility] 07/20/2019 with repeat catheterization with DES to OM. Has was to be observed overnight and and discharged to home on 07/21/2019.  Past Medical History:  Diagnosis Date  . Alcohol abuse   . Allergy    seasonal  . Anxiety   . CAD (coronary artery disease)    a. 06/2019 cath - s/p successful PCI of the ostial to mid LAD with IVUS guidance and DES x 2.  . Depression   . Essential hypertension   . GERD (gastroesophageal reflux disease)   . Hyperlipidemia   . Hypertension   . OA (osteoarthritis) of knee   . Pulmonary nodules    a. seen on coronary CT 05/2019, will need OP f/u.  Marland Kitchen Rotator  cuff tear arthropathy of both shoulders    On DISABILITY, non operative case  . Shoulder pain   . Tobacco abuse   . Vertigo     Past Surgical History:  Procedure Laterality Date  . CARDIAC CATHETERIZATION  06/2005   no significant CAD  . COLONOSCOPY WITH PROPOFOL N/A 05/01/2019   Procedure: COLONOSCOPY WITH PROPOFOL;  Surgeon: Daneil Dolin, MD;  Location: [facility];  Service: Endoscopy;  Laterality: N/A;  7:30am  . CORONARY STENT INTERVENTION N/A 07/05/2019   Procedure: CORONARY STENT INTERVENTION;  Surgeon: Martinique, Peter M, MD;  Location: [place] [facility];  Service: Cardiovascular;  Laterality: N/A;  . ESOPHAGOGASTRODUODENOSCOPY  08/2005   Dr. Henrene Pastor: small hiatal hernia  . HERNIA REPAIR  1983  . INTRAVASCULAR PRESSURE WIRE/FFR STUDY N/A 07/05/2019   Procedure: INTRAVASCULAR PRESSURE WIRE/FFR STUDY;  Surgeon: Martinique, Peter M, MD;  Location: [place] [facility];  Service: Cardiovascular;  Laterality: N/A;  . INTRAVASCULAR ULTRASOUND/IVUS N/A 07/05/2019   Procedure: Intravascular Ultrasound/IVUS;  Surgeon: Martinique, Peter M, MD;  Location: [place] [facility];  Service: Cardiovascular;  Laterality: N/A;  . LEFT HEART CATH AND CORONARY ANGIOGRAPHY N/A 07/05/2019   Procedure: LEFT HEART CATH AND CORONARY ANGIOGRAPHY;  Surgeon: Martinique, Peter M, MD;  Location: [place] [facility];  Service: Cardiovascular;  Laterality: N/A;  . POLYPECTOMY  05/01/2019   Procedure: POLYPECTOMY;  Surgeon: Daneil Dolin, MD;  Location: [facility];  Service: Endoscopy;;  . ROTATOR CUFF REPAIR Bilateral   . TONSILLECTOMY  1977    No current facility-administered medications for this visit.   No current outpatient medications on file.   Facility-Administered Medications Ordered in Other Visits  Medication Dose Route Frequency Provider Last Rate Last Admin  . 0.9 %  sodium chloride infusion  250 mL Intravenous PRN Ahmed Prima, [place] M, PA-C      . 0.9 %  sodium chloride infusion  250 mL Intravenous PRN Jettie Booze, MD      . acetaminophen (TYLENOL) tablet 500 mg  500 mg Oral Q6H PRN Erma Heritage, PA-C   500 mg at 07/20/19 0141  . acetaminophen (TYLENOL) tablet 650 mg  650 mg Oral Q4H PRN Jettie Booze, MD      . albuterol (PROVENTIL) (2.5 MG/3ML) 0.083% nebulizer solution 2.5 mg  2.5 mg Nebulization Q6H PRN Ahmed Prima, [place] M, PA-C      . amLODipine (NORVASC) tablet 10 mg  10 mg Oral Daily Bernerd Pho M, PA-C   10 mg at 07/20/19 0737  . aspirin chewable tablet 81 mg  81 mg Oral Daily Erma Heritage, PA-C   81 mg at 07/20/19 1062  . atenolol (TENORMIN) tablet 50 mg  50 mg Oral Daily Bernerd Pho M, PA-C   50 mg at 07/20/19 0929  . atorvastatin (LIPITOR) tablet 80 mg  80 mg Oral QPM Erma Heritage, PA-C   80 mg at 07/20/19 1633  . Chlorhexidine Gluconate Cloth 2 % PADS 6 each  6 each Topical Daily Strader, Brittany M, PA-C      . [START ON 07/21/2019] clopidogrel (PLAVIX) tablet 75 mg  75 mg Oral Q breakfast Jettie Booze, MD      . diazepam (VALIUM) tablet 5 mg  5 mg Oral Q12H PRN Erma Heritage, PA-C   5 mg at 07/20/19 2027  . nitroGLYCERIN (NITROSTAT) SL tablet 0.4 mg  0.4 mg Sublingual Q5 min PRN Ahmed Prima, [place] M, PA-C      . nitroGLYCERIN 50 mg in dextrose 5 % 250 mL (0.2 mg/mL) infusion  0-200 mcg/min Intravenous Titrated Erma Heritage, PA-C   Stopped at 07/20/19 1343  . ondansetron (ZOFRAN) injection 4 mg  4 mg Intravenous Q6H PRN Jettie Booze, MD      . pantoprazole (PROTONIX) EC tablet 40 mg  40 mg Oral BID Bernerd Pho M, PA-C   40 mg at 07/20/19 2027  . sodium chloride flush (NS) 0.9 % injection 3 mL  3 mL Intravenous Q12H Strader, [place] M, PA-C   3 mL at 07/20/19 2030  . sodium chloride flush (NS) 0.9 % injection 3 mL  3 mL Intravenous Q12H Larae Grooms S, MD      . sodium chloride flush (NS) 0.9 % injection 3 mL  3 mL Intravenous PRN Jettie Booze, MD      . umeclidinium-vilanterol [facility] ELLIPTA)  62.5-25 MCG/INH 1 puff  1 puff Inhalation Daily Erma Heritage, PA-C   1 puff at 07/20/19 0806  . vitamin B-12 (CYANOCOBALAMIN) tablet 2,000 mcg  2,000 mcg Oral Daily Bernerd Pho M, PA-C   2,000 mcg at 07/20/19 6948   Allergies:  Patient has no known allergies.   Social History: The patient  reports that he has been smoking cigarettes. He started smoking about 49 years ago. He has a 43.00 pack-year smoking history. He has never used smokeless tobacco. He reports current alcohol  use. He reports that he does not use drugs.   Family History: The patient's family history includes Cancer in his brother and another family member; Diabetes in his mother; Heart disease in his father and mother; Hypertension in his father and mother; Stroke in an other family member.   ROS:  Please see the history of present illness. Otherwise, complete review of systems is positive for {NONE DEFAULTED:18576::"none"}.  All other systems are reviewed and negative.   Physical Exam: VS:  There were no vitals taken for this visit., BMI There is no height or weight on file to calculate BMI.  Wt Readings from Last 3 Encounters:  07/20/19 160 lb 7.9 oz (72.8 kg)  07/06/19 164 lb 7.1 oz (74.6 kg)  06/29/19 165 lb 6.4 oz (75 kg)    General: Patient appears comfortable at rest. HEENT: Conjunctiva and lids normal, oropharynx clear with moist mucosa. Neck: Supple, no elevated JVP or carotid bruits, no thyromegaly. Lungs: Clear to auscultation, nonlabored breathing at rest. Cardiac: Regular rate and rhythm, no S3 or significant systolic murmur, no pericardial rub. Abdomen: Soft, nontender, no hepatomegaly, bowel sounds present, no guarding or rebound. Extremities: No pitting edema, distal pulses 2+. Skin: Warm and dry. Musculoskeletal: No kyphosis. Neuropsychiatric: Alert and oriented x3, affect grossly appropriate.  ECG:  {EKG/Telemetry Strips [phone]}  Recent Labwork: 03/18/2019: ALT 12; AST  20 07/19/2019: BUN 8; Creatinine, Ser 0.80; Hemoglobin 12.9; Platelets 278; Potassium 3.5; Sodium 133     Component Value Date/Time   CHOL 249 (H) 03/19/2019 0834   TRIG 196 (H) 03/19/2019 0834   HDL 57 03/19/2019 0834   CHOLHDL 4.4 03/19/2019 0834   VLDL 39 03/19/2019 0834   LDLCALC 153 (H) 03/19/2019 0834   LDLCALC 108 (H) 01/02/2019 1432    Other Studies Reviewed Today:   Echocardiogram: 02/2019 IMPRESSIONS  1. Left ventricular ejection fraction, by estimation, is 55 to 60%. The  left ventricle has normal function. The left ventricle has no regional  wall motion abnormalities. Left ventricular diastolic parameters were  normal.  2. Right ventricular systolic function is normal. The right ventricular  size is normal. Tricuspid regurgitation signal is inadequate for assessing  PA pressure.  3. Left atrial size was upper normal.  4. Right atrial size was upper normal.  5. The mitral valve is grossly normal. Trivial mitral valve  regurgitation.  6. The aortic valve is tricuspid. Aortic valve regurgitation is mild.  7. The inferior vena cava is normal in size with greater than 50%  respiratory variability, suggesting right atrial pressure of 3 mmHg  Cardiac catheterization 07/05/2019   Prox LAD to Mid LAD lesion is 70% stenosed.  A drug-eluting stent was successfully placed using a STENT RESOLUTE ONYX 3.0X22.  Post intervention, there is a 0% residual stenosis.  Mid LAD lesion is 50% stenosed.  A drug-eluting stent was successfully placed using a STENT RESOLUTE ONYX 2.5X30.  Post intervention, there is a 0% residual stenosis.  1st Mrg lesion is 95% stenosed.  The left ventricular systolic function is normal.  LV end diastolic pressure is normal.  The left ventricular ejection fraction is 55-65% by visual estimate.   1. Severe 2 vessel obstructive CAD. Abnormal FFR of the LAD. 2. Normal LV function 3. Normal LVEDP 4. Successful PCI of the ostial to mid LAD  with IVUS guidance and DES x 2.   Plan: DAPT for one year. Optimize medical therapy. Smoking cessation. If patient continues to have symptoms despite optimal therapy could consider PCI  of the OM but this is a small branch and supplies a relatively small area of myocardium.  Diagnostic Dominance: Right   Intervention      Cardiac catheterization 07/20/2019   Previously placed Mid LAD drug eluting stent is widely patent.  Previously placed Prox LAD to Mid LAD drug eluting stent is widely patent.  1st Mrg lesion is 95% stenosed.  A drug-eluting stent was successfully placed using a STENT RESOLUTE ONYX 2.25X26, postdilated to 2.6 mm.  Post intervention, there is a 0% residual stenosis.  The left ventricular systolic function is normal.  LV end diastolic pressure is normal.  The left ventricular ejection fraction is 55-65% by visual estimate.  There is no aortic valve stenosis.   Continue aggressive secondary prevention.  Consider clopidogrel monotherapy after 12 months.   Watch overnight.  Home tomorrow.   He is interested in getting the COVID-19 vaccine.  I encouraged him to get the vaccines when possible.  Diagnostic Dominance: Right  Intervention        Assessment and Plan:  1. CAD in native artery   2. Essential hypertension   3. Mixed hyperlipidemia   4. Tobacco abuse   5. Pulmonary nodules/lesions, multiple    1. CAD in native artery ***  2. Essential hypertension ***  3. Mixed hyperlipidemia ***  4. Tobacco abuse ***  5. Pulmonary nodules/lesions, multiple ***  Medication Adjustments/Labs and Tests Ordered: Current medicines are reviewed at length with the patient today.  Concerns regarding medicines are outlined above.   Disposition: Follow-up with ***  Signed, [name] July, NP 07/20/2019 8:57 PM    [place] at [facility], [place], [place] [address] Phone: [phone]; Fax: [phone]

## 2019-07-20 NOTE — Progress Notes (Signed)
ANTICOAGULATION CONSULT NOTE - Initial Consult  Pharmacy Consult for heparin Indication: chest pain/ACS  No Known Allergies  Patient Measurements: Height: 5\' 4"  (162.6 cm) Weight: 72.8 kg (160 lb 7.9 oz) IBW/kg (Calculated) : 59.2 Heparin Dosing Weight: 72.8 kg  Vital Signs: Temp: 97.8 F (36.6 C) (06/24 0713) Temp Source: Oral (06/24 0713) BP: 117/76 (06/24 0700) Pulse Rate: 76 (06/24 0713)  Labs: Recent Labs    07/19/19 1535 07/19/19 1705 07/19/19 2049  HGB 12.9*  --   --   HCT 39.9  --   --   PLT 278  --   --   CREATININE 0.80  --   --   TROPONINIHS 5 5 5     Estimated Creatinine Clearance: 84.1 mL/min (by C-G formula based on SCr of 0.8 mg/dL).   Medical History: Past Medical History:  Diagnosis Date  . Alcohol abuse   . Allergy    seasonal  . Anxiety   . CAD (coronary artery disease)    a. 06/2019 cath - s/p successful PCI of the ostial to mid LAD with IVUS guidance and DES x 2.  . Depression   . Essential hypertension   . GERD (gastroesophageal reflux disease)   . Hyperlipidemia   . Hypertension   . OA (osteoarthritis) of knee   . Pulmonary nodules    a. seen on coronary CT 05/2019, will need OP f/u.  Marland Kitchen Rotator cuff tear arthropathy of both shoulders    On DISABILITY, non operative case  . Shoulder pain   . Tobacco abuse   . Vertigo     Medications:  Medications Prior to Admission  Medication Sig Dispense Refill Last Dose  . acetaminophen (TYLENOL) 500 MG tablet Take 500 mg by mouth every 6 (six) hours as needed for mild pain or headache.    07/19/2019 at Unknown time  . albuterol (PROVENTIL HFA;VENTOLIN HFA) 108 (90 Base) MCG/ACT inhaler Inhale 2 puffs into the lungs every 6 (six) hours as needed for wheezing or shortness of breath. 1 Inhaler 2 unknown  . amLODipine (NORVASC) 5 MG tablet Take 1 tablet (5 mg total) by mouth daily. 90 tablet 3 07/19/2019 at Unknown time  . aspirin 81 MG tablet Take 81 mg by mouth daily.   07/19/2019 at Unknown time  .  atenolol (TENORMIN) 50 MG tablet Take 1 tablet (50 mg total) by mouth daily. 90 tablet 3 07/19/2019 at 530a  . atorvastatin (LIPITOR) 80 MG tablet Take 1 tablet (80 mg total) by mouth every evening. 30 tablet 6 07/18/2019 at Unknown time  . clopidogrel (PLAVIX) 75 MG tablet Take 1 tablet (75 mg total) by mouth daily. 30 tablet 11 07/19/2019 at Unknown time  . [facility] VITAMIN B-12 2000 MCG TBCR Take 2,000 mcg by mouth daily.   07/19/2019 at Unknown time  . diazepam (VALIUM) 5 MG tablet Take 1 tablet (5 mg total) by mouth every 12 (twelve) hours as needed for anxiety. 45 tablet 1 07/19/2019 at Unknown time  . nitroGLYCERIN (NITROSTAT) 0.4 MG SL tablet Place 1 tablet (0.4 mg total) under the tongue every 5 (five) minutes as needed for chest pain (up to 3 doses). 25 tablet 3 07/19/2019 at 1400  . pantoprazole (PROTONIX) 40 MG tablet Take 1 tablet (40 mg total) by mouth 2 (two) times daily. 60 tablet 6 07/19/2019 at Unknown time  . umeclidinium-vilanterol (ANORO ELLIPTA) 62.5-25 MCG/INH AEPB Inhale 1 puff into the lungs daily. 1 each 11 07/19/2019 at Unknown time  . meclizine (ANTIVERT) 25  MG tablet TAKE 1 TABLET BY MOUTH THREE TIMES DAILY AS NEEDED FOR DIZZINESS (Patient not taking: Reported on 07/19/2019) 90 tablet 2 Not Taking at Unknown time    Assessment: Pharmacy consulted to dose heparin in patient with chest pain/ACS.  Patient is not on anticoagulation prior to admission.  Trop 5  Goal of Therapy:  Heparin level 0.3-0.7 units/ml Monitor platelets by anticoagulation protocol: Yes   Plan:  Give 4000 units bolus x 1 Start heparin infusion at 900 units/hr Heparin level in 6-8 hours and daily Monitor H&H and s/s of bleeding.  Revonda Standard Sohum Delillo 07/20/2019,10:26 AM

## 2019-07-20 NOTE — Interval H&P Note (Signed)
Cath Lab Visit (complete for each Cath Lab visit)  Clinical Evaluation Leading to the Procedure:   ACS: Yes.    Non-ACS:    Anginal Classification: CCS IV  Anti-ischemic medical therapy: Minimal Therapy (1 class of medications)  Non-Invasive Test Results: No non-invasive testing performed  Prior CABG: No previous CABG      History and Physical Interval Note:  07/20/2019 1:22 PM  Bill Johnson  has presented today for surgery, with the diagnosis of unstable angina.  The various methods of treatment have been discussed with the patient and family. After consideration of risks, benefits and other options for treatment, the patient has consented to  Procedure(s): LEFT HEART CATH AND CORONARY ANGIOGRAPHY (N/A) as a surgical intervention.  The patient's history has been reviewed, patient examined, no change in status, stable for surgery.  I have reviewed the patient's chart and labs.  Questions were answered to the patient's satisfaction.     Larae Grooms

## 2019-07-21 ENCOUNTER — Other Ambulatory Visit: Payer: Self-pay | Admitting: Physician Assistant

## 2019-07-21 ENCOUNTER — Ambulatory Visit: Payer: Medicare Other | Admitting: Family Medicine

## 2019-07-21 ENCOUNTER — Encounter (HOSPITAL_COMMUNITY): Payer: Self-pay | Admitting: Interventional Cardiology

## 2019-07-21 DIAGNOSIS — Z20822 Contact with and (suspected) exposure to covid-19: Secondary | ICD-10-CM | POA: Diagnosis present

## 2019-07-21 DIAGNOSIS — I10 Essential (primary) hypertension: Secondary | ICD-10-CM | POA: Diagnosis present

## 2019-07-21 DIAGNOSIS — M19019 Primary osteoarthritis, unspecified shoulder: Secondary | ICD-10-CM | POA: Diagnosis present

## 2019-07-21 DIAGNOSIS — Z955 Presence of coronary angioplasty implant and graft: Secondary | ICD-10-CM

## 2019-07-21 DIAGNOSIS — Z7982 Long term (current) use of aspirin: Secondary | ICD-10-CM | POA: Diagnosis not present

## 2019-07-21 DIAGNOSIS — H9190 Unspecified hearing loss, unspecified ear: Secondary | ICD-10-CM | POA: Diagnosis present

## 2019-07-21 DIAGNOSIS — Z8249 Family history of ischemic heart disease and other diseases of the circulatory system: Secondary | ICD-10-CM | POA: Diagnosis not present

## 2019-07-21 DIAGNOSIS — Z7902 Long term (current) use of antithrombotics/antiplatelets: Secondary | ICD-10-CM | POA: Diagnosis not present

## 2019-07-21 DIAGNOSIS — J449 Chronic obstructive pulmonary disease, unspecified: Secondary | ICD-10-CM | POA: Diagnosis present

## 2019-07-21 DIAGNOSIS — I25110 Atherosclerotic heart disease of native coronary artery with unstable angina pectoris: Secondary | ICD-10-CM | POA: Diagnosis not present

## 2019-07-21 DIAGNOSIS — F1010 Alcohol abuse, uncomplicated: Secondary | ICD-10-CM | POA: Diagnosis present

## 2019-07-21 DIAGNOSIS — M1710 Unilateral primary osteoarthritis, unspecified knee: Secondary | ICD-10-CM | POA: Diagnosis present

## 2019-07-21 DIAGNOSIS — R918 Other nonspecific abnormal finding of lung field: Secondary | ICD-10-CM | POA: Diagnosis present

## 2019-07-21 DIAGNOSIS — K219 Gastro-esophageal reflux disease without esophagitis: Secondary | ICD-10-CM | POA: Diagnosis present

## 2019-07-21 DIAGNOSIS — Z79899 Other long term (current) drug therapy: Secondary | ICD-10-CM | POA: Diagnosis not present

## 2019-07-21 DIAGNOSIS — Z87891 Personal history of nicotine dependence: Secondary | ICD-10-CM | POA: Diagnosis not present

## 2019-07-21 DIAGNOSIS — R079 Chest pain, unspecified: Secondary | ICD-10-CM | POA: Diagnosis present

## 2019-07-21 DIAGNOSIS — I25111 Atherosclerotic heart disease of native coronary artery with angina pectoris with documented spasm: Secondary | ICD-10-CM | POA: Diagnosis not present

## 2019-07-21 DIAGNOSIS — D649 Anemia, unspecified: Secondary | ICD-10-CM | POA: Diagnosis present

## 2019-07-21 DIAGNOSIS — E785 Hyperlipidemia, unspecified: Secondary | ICD-10-CM | POA: Diagnosis present

## 2019-07-21 DIAGNOSIS — E538 Deficiency of other specified B group vitamins: Secondary | ICD-10-CM | POA: Diagnosis present

## 2019-07-21 LAB — CBC
HCT: 38.9 % — ABNORMAL LOW (ref 39.0–52.0)
Hemoglobin: 12.5 g/dL — ABNORMAL LOW (ref 13.0–17.0)
MCH: 32.8 pg (ref 26.0–34.0)
MCHC: 32.1 g/dL (ref 30.0–36.0)
MCV: 102.1 fL — ABNORMAL HIGH (ref 80.0–100.0)
Platelets: 208 10*3/uL (ref 150–400)
RBC: 3.81 MIL/uL — ABNORMAL LOW (ref 4.22–5.81)
RDW: 13.3 % (ref 11.5–15.5)
WBC: 7.7 10*3/uL (ref 4.0–10.5)
nRBC: 0 % (ref 0.0–0.2)

## 2019-07-21 LAB — BASIC METABOLIC PANEL
Anion gap: 10 (ref 5–15)
BUN: 6 mg/dL — ABNORMAL LOW (ref 8–23)
CO2: 24 mmol/L (ref 22–32)
Calcium: 8.8 mg/dL — ABNORMAL LOW (ref 8.9–10.3)
Chloride: 106 mmol/L (ref 98–111)
Creatinine, Ser: 0.84 mg/dL (ref 0.61–1.24)
GFR calc Af Amer: >60 mL/min (ref >60)
GFR calc non Af Amer: >60 mL/min (ref >60)
Glucose, Bld: 115 mg/dL — ABNORMAL HIGH (ref 70–99)
Potassium: 3.7 mmol/L (ref 3.5–5.1)
Sodium: 140 mmol/L (ref 135–145)

## 2019-07-21 LAB — POCT ACTIVATED CLOTTING TIME: Activated Clotting Time: 362 seconds

## 2019-07-21 MED ORDER — ATENOLOL 25 MG PO TABS
50.0000 mg | ORAL_TABLET | Freq: Every day | ORAL | Status: DC
  Administered 2019-07-21: 50 mg via ORAL
  Filled 2019-07-21: qty 2

## 2019-07-21 MED ORDER — AMLODIPINE BESYLATE 5 MG PO TABS
5.0000 mg | ORAL_TABLET | Freq: Every day | ORAL | Status: DC
  Administered 2019-07-21: 5 mg via ORAL
  Filled 2019-07-21: qty 1

## 2019-07-21 NOTE — Progress Notes (Signed)
CARDIAC REHAB PHASE I   PRE:  Rate/Rhythm: 77 SR  BP:  Supine: 130/77  Sitting:   Standing:    SaO2: 96%RA  MODE:  Ambulation: 1300 ft   POST:  Rate/Rhythm: 77 SR  BP:  Supine:   Sitting: 135/78  Standing:    SaO2: 98%RA 0805-0845 Pt walked 1300 ft on RA with steady gait. No CP. Tolerated well. Pt has quit smoking and drinking ETOH. Congratulated pt on this. Brief review of importance of plavix, how to take NTG and gave walking ex as pt just seen by [place] a couple of weeks ago. Pt is very active and does not want to attend [place] CRP 2. Referral sent to meet protocol but pt not interested. Pt stated he has heart healthy diet given previously. Voiced understanding of ed.   Graylon Good, RN BSN  07/21/2019 8:40 AM

## 2019-07-21 NOTE — Discharge Summary (Signed)
Discharge Summary    Patient ID: Bill Johnson MRN: [id number]; DOB: [birth year]  Admit date: 07/19/2019 Discharge date: 07/21/2019  Primary Care Provider: Alycia Rossetti, MD  Primary Cardiologist: Bill Sable, MD  Primary Electrophysiologist:  None   Discharge Diagnoses    Principal Problem:   Coronary artery disease involving native coronary artery of native heart with unstable angina pectoris [facility]) Active Problems:   Hypertension   COPD (chronic obstructive pulmonary disease) ([place])   GERD (gastroesophageal reflux disease)   Hyperlipemia   Chest pain   Diagnostic Studies/Procedures    Coronary stent intervention 07/20/19:  Previously placed Mid LAD drug eluting stent is widely patent.  Previously placed Prox LAD to Mid LAD drug eluting stent is widely patent.  1st Mrg lesion is 95% stenosed.  A drug-eluting stent was successfully placed using a STENT RESOLUTE ONYX 2.25X26, postdilated to 2.6 mm.  Post intervention, there is a 0% residual stenosis.  The left ventricular systolic function is normal.  LV end diastolic pressure is normal.  The left ventricular ejection fraction is 55-65% by visual estimate.  There is no aortic valve stenosis.   Continue aggressive secondary prevention.  Consider clopidogrel monotherapy after 12 months.   Watch overnight.  Home tomorrow.   He is interested in getting the COVID-19 vaccine.  I encouraged him to get the vaccines when possible.   _____________   Left heart cath 07/05/19:  Prox LAD to Mid LAD lesion is 70% stenosed.  A drug-eluting stent was successfully placed using a STENT RESOLUTE ONYX 3.0X22.  Post intervention, there is a 0% residual stenosis.  Mid LAD lesion is 50% stenosed.  A drug-eluting stent was successfully placed using a STENT RESOLUTE ONYX 2.5X30.  Post intervention, there is a 0% residual stenosis.  1st Mrg lesion is 95% stenosed.  The left ventricular systolic function is  normal.  LV end diastolic pressure is normal.  The left ventricular ejection fraction is 55-65% by visual estimate.   1. Severe 2 vessel obstructive CAD. Abnormal FFR of the LAD. 2. Normal LV function 3. Normal LVEDP 4. Successful PCI of the ostial to mid LAD with IVUS guidance and DES x 2.   Plan: DAPT for one year. Optimize medical therapy. Smoking cessation. If patient continues to have symptoms despite optimal therapy could consider PCI of the OM but this is a small branch and supplies a relatively small area of myocardium.   _____________   Echo 03/19/19 1. Left ventricular ejection fraction, by estimation, is 55 to 60%. The  left ventricle has normal function. The left ventricle has no regional  wall motion abnormalities. Left ventricular diastolic parameters were  normal.  2. Right ventricular systolic function is normal. The right ventricular  size is normal. Tricuspid regurgitation signal is inadequate for assessing  PA pressure.  3. Left atrial size was upper normal.  4. Right atrial size was upper normal.  5. The mitral valve is grossly normal. Trivial mitral valve  regurgitation.  6. The aortic valve is tricuspid. Aortic valve regurgitation is mild.  7. The inferior vena cava is normal in size with greater than 50%  respiratory variability, suggesting right atrial pressure of 3 mmHg.    History of Present Illness     Bill Johnson is a 64 y.o. male with past medical history of CAD (s/p DESx2 to LAD on 07/05/2019 with  residual  95% OM1 stenosis with consideration of PCI to OM if residual symptoms), HTN, HLD, tobacco use and prior  alcohol use who is being seen today for the evaluation of chest pain.   [name] was recently admitted to [facility] from 6/9 - 07/06/2019 for a planned cardiac catheterization after recent Coronary CTA was suggestive of a flow-limiting lesion along the LAD. His catheterization showed 70% Prox-LAD stenosis followed by 50% mid-LAD  which were treated with DESx2. He did have a residual 95% 1st Mrg stenosis and it was recommended if he continued to have symptoms despite optimal therapy could consider PCI of the OM but it was a small branch and supplied a relatively small area of myocardium, therefore medical therapy was initially recommended. He was started on ASA and Plavix with Atorvastatin being titrated from 40mg  daily to 80mg  daily. He did have pulmonary nodules noted on his recent CT with follow-up imaging recommended in 6-12 months.   He presented to [facility] ED 07/19/19 for evaluation of chest pain which had started 3 days prior to admission. He reports overall feeling well following his recent catheterization. Was gradually increasing his activity and was walking for 27 minute increments without symptoms. Starting on Sunday, he started to developed dyspnea, fatigue and episodes of pressure along his left pectoral region which radiated into his shoulder. Symptoms improved with SL NTG but would return. He is now unable to walk to the mailbox without symptoms. He does report he had chest discomfort waxing and waning throughout the night which was worse with being supine. Still having 2/10 chest discomfort this AM. He denies any recent orthopnea, PND or edema. He did quit smoking after his intervention and has also not consumed alcohol since his recent stent placement.    Initial labs show WBC 7.8, Hgb 12.9, platelets 278, Na+ 133, K+ 3.5 and creatinine 0.80. Initial and repeat HS Troponin values have been negative at 5. COVID negative. CXR shows no acute cardiopulmonary abnormalities. EKG shows sinus bradycardia, HR 58 with no acute ST abnormalities when compared to prior tracings.   His case was reviewed with Bill Johnson who was on-call yesterday evening and his note mentions he had a jailed diagonal branch following his recent PCI which could be the source of his angina but also has known residual OM stenosis. He recommended  initiation of Imdur 30mg  daily and to titrate Amlodipine to 10mg  daily with evaluation by Cardiology to see if transfer to [facility] was warranted. He continued to have episodes of chest pain while in the ED, therefore Imdur was discontinued and he was started on IV NTG. He continued to have chest pain and was therefore transferred to [facility] for further evaluation.    Hospital Course     Consultants: none   CAD  S/P DES-pLAD, DES-midLAD - 07/05/19 S/P DES-first marginal - 07/20/19 Pt had residual stenosis in the OM, which was a small vessel supplying a small area. HS troponin x 3 negative. Due to ongoing unstable angina, he was transferred to [facility] and underwent repeat angiography. LHC showed patent stents x 2 in LAD. DES placed first marginal. Pt tolerated the procedure well. Nitro and heparin off. Continue ASA and plavix, atenolol, and 80 mg lipitor. Amlodipine had been titrated to 10 mg, based on pressures today, will reduce back to 5 mg.  Imudr was not restarted. He is chest pain free and has ambulated in the hall. Cath site C/D/I.   Hypertension Medications as above. Imdur held. Amlodipine reduced back to 5 mg given BP this morning.    Hyperlipidemia with LDL goal < 70 03/19/2019: Cholesterol  249; HDL 57; LDL Cholesterol 153; Triglycerides 196; VLDL 39 Lipitor increased to 80 mg. Will need fasting lipids in 6 weeks.  May need to consider PCSK9i if not at goal at that time.    Prior tobacco use and alcohol use He stopped drinking and smoking following PCI on 07/05/19. Encouraged cessation.    Anemia Hb stable at 12.5. No active bleeding. Continue DAPT.     Pulmonary nodules Recent CT with several small pulmonary nodules. Recommend CT chest in 6-12 months. Will ask him to follow up with PCP for this.       Did the patient have an acute coronary syndrome (MI, NSTEMI, STEMI, etc) this admission?:  No                               Did the patient have a percutaneous coronary intervention  (stent / angioplasty)?:  Yes.     Cath/PCI Registry Performance & Quality Measures: 1. Aspirin prescribed? - Yes 2. ADP Receptor Inhibitor (Plavix/Clopidogrel, Brilinta/Ticagrelor or Effient/Prasugrel) prescribed (includes medically managed patients)? - Yes 3. High Intensity Statin (Lipitor 40-80mg  or Crestor 20-40mg ) prescribed? - Yes 4. For EF <40%, was ACEI/ARB prescribed? - Not Applicable (EF >/= 16%) 5. For EF <40%, Aldosterone Antagonist (Spironolactone or Eplerenone) prescribed? - Not Applicable (EF >/= 10%) 6. Cardiac Rehab Phase II ordered? - Yes   _____________  Discharge Vitals Blood pressure 130/77, pulse 67, temperature 97.9 F (36.6 C), temperature source Oral, resp. rate 18, height 5\' 4"  (1.626 m), weight 72.9 kg, SpO2 96 %.  Filed Weights   07/19/19 1502 07/20/19 0032 07/21/19 0516  Weight: 74.8 kg 72.8 kg 72.9 kg   Physical Exam Constitutional:      Appearance: He is well-developed.  HENT:     Head: Normocephalic.  Cardiovascular:     Rate and Rhythm: Normal rate and regular rhythm.     Heart sounds: Normal heart sounds. No murmur heard.   Pulmonary:     Breath sounds: Normal breath sounds.  Abdominal:     General: Bowel sounds are normal.     Palpations: Abdomen is soft.  Musculoskeletal:     Right lower leg: No edema.     Left lower leg: No edema.  Skin:    General: Skin is warm and dry.  Neurological:     Mental Status: He is alert and oriented to person, place, and time.  Psychiatric:        Mood and Affect: Mood normal.   Right radial cath site C/D/I   Labs & Radiologic Studies    CBC Recent Labs    07/19/19 1535 07/21/19 0645  WBC 7.8 7.7  HGB 12.9* 12.5*  HCT 39.9 38.9*  MCV 101.8* 102.1*  PLT 278 960   Basic Metabolic Panel Recent Labs    07/19/19 1535 07/21/19 0645  NA 133* 140  K 3.5 3.7  CL 101 106  CO2 22 24  GLUCOSE 87 115*  BUN 8 6*  CREATININE 0.80 0.84  CALCIUM 8.9 8.8*   Liver Function Tests No results for  input(s): AST, ALT, ALKPHOS, BILITOT, PROT, ALBUMIN in the last 72 hours. No results for input(s): LIPASE, AMYLASE in the last 72 hours. High Sensitivity Troponin:   Recent Labs  Lab 07/19/19 1535 07/19/19 1705 07/19/19 2049  TROPONINIHS 5 5 5     BNP Invalid input(s): POCBNP D-Dimer No results for input(s): DDIMER in the last 72 hours. Hemoglobin A1C  No results for input(s): HGBA1C in the last 72 hours. Fasting Lipid Panel No results for input(s): CHOL, HDL, LDLCALC, TRIG, CHOLHDL, LDLDIRECT in the last 72 hours. Thyroid Function Tests No results for input(s): TSH, T4TOTAL, T3FREE, THYROIDAB in the last 72 hours.  Invalid input(s): FREET3 _____________  DG Chest 2 View  Result Date: 07/19/2019 CLINICAL DATA:  Chest pain. Additional provided: Patient began having chest pain on Sunday, 2 stents placed on the 9th of June. EXAM: CHEST - 2 VIEW COMPARISON:  CT chest 05/19/2019, chest radiograph 05/11/2019 FINDINGS: Heart size within normal limits. Aortic atherosclerosis. No appreciable airspace consolidation. Redemonstrated mild bibasilar pulmonary scarring. No frank pulmonary edema. No evidence of pleural effusion or pneumothorax. No acute bony abnormality identified. IMPRESSION: No evidence of acute cardiopulmonary abnormality. Redemonstrated mild scarring within the bilateral lung bases. Please refer to CT chest 05/19/2019 for a description of previously demonstrated pulmonary nodules and recommendations for follow-up. Aortic Atherosclerosis (ICD10-I70.0). Electronically Signed   By: Kellie Simmering DO   On: 07/19/2019 15:27   CARDIAC CATHETERIZATION  Result Date: 07/21/2019  Previously placed Mid LAD drug eluting stent is widely patent.  Previously placed Prox LAD to Mid LAD drug eluting stent is widely patent.  1st Mrg lesion is 95% stenosed.  A drug-eluting stent was successfully placed using a STENT RESOLUTE ONYX 2.25X26, postdilated to 2.6 mm.  Post intervention, there is a 0%  residual stenosis.  The left ventricular systolic function is normal.  LV end diastolic pressure is normal.  The left ventricular ejection fraction is 55-65% by visual estimate.  There is no aortic valve stenosis.  Continue aggressive secondary prevention.  Consider clopidogrel monotherapy after 12 months. Watch overnight.  Home tomorrow. He is interested in getting the COVID-19 vaccine.  I encouraged him to get the vaccines when possible.   CARDIAC CATHETERIZATION  Result Date: 07/05/2019  Prox LAD to Mid LAD lesion is 70% stenosed.  A drug-eluting stent was successfully placed using a STENT RESOLUTE ONYX 3.0X22.  Post intervention, there is a 0% residual stenosis.  Mid LAD lesion is 50% stenosed.  A drug-eluting stent was successfully placed using a STENT RESOLUTE ONYX 2.5X30.  Post intervention, there is a 0% residual stenosis.  1st Mrg lesion is 95% stenosed.  The left ventricular systolic function is normal.  LV end diastolic pressure is normal.  The left ventricular ejection fraction is 55-65% by visual estimate.  1. Severe 2 vessel obstructive CAD. Abnormal FFR of the LAD. 2. Normal LV function 3. Normal LVEDP 4. Successful PCI of the ostial to mid LAD with IVUS guidance and DES x 2. Plan: DAPT for one year. Optimize medical therapy. Smoking cessation. If patient continues to have symptoms despite optimal therapy could consider PCI of the OM but this is a small branch and supplies a relatively small area of myocardium.   Disposition   Pt is being discharged home today in good condition.  Follow-up Plans & Appointments     Follow-up Information    Verta Ellen., NP Follow up on 08/02/2019.   Specialty: Cardiology Why: 10:40 for [facility] Contact information: [place] [address] [phone]              Discharge Instructions    AMB Referral to Cardiac Rehabilitation - Phase II   Complete by: As directed    Diagnosis: Coronary Stents   After  initial evaluation and assessments completed: Virtual Based Care may be provided alone or in  conjunction with Phase 2 Cardiac Rehab based on patient barriers.: Yes      Discharge Medications   Allergies as of 07/21/2019   No Known Allergies     Medication List    TAKE these medications   acetaminophen 500 MG tablet Commonly known as: TYLENOL Take 500 mg by mouth every 6 (six) hours as needed for mild pain or headache.   albuterol 108 (90 Base) MCG/ACT inhaler Commonly known as: VENTOLIN HFA Inhale 2 puffs into the lungs every 6 (six) hours as needed for wheezing or shortness of breath.   amLODipine 5 MG tablet Commonly known as: NORVASC Take 1 tablet (5 mg total) by mouth daily.   aspirin 81 MG tablet Take 81 mg by mouth daily.   atenolol 50 MG tablet Commonly known as: TENORMIN Take 1 tablet (50 mg total) by mouth daily.   atorvastatin 80 MG tablet Commonly known as: LIPITOR Take 1 tablet (80 mg total) by mouth every evening.   clopidogrel 75 MG tablet Commonly known as: PLAVIX Take 1 tablet (75 mg total) by mouth daily.   [employer] Vitamin B-12 2000 MCG Tbcr Generic drug: Cyanocobalamin Take 2,000 mcg by mouth daily.   diazepam 5 MG tablet Commonly known as: VALIUM Take 1 tablet (5 mg total) by mouth every 12 (twelve) hours as needed for anxiety.   meclizine 25 MG tablet Commonly known as: ANTIVERT TAKE 1 TABLET BY MOUTH THREE TIMES DAILY AS NEEDED FOR DIZZINESS   nitroGLYCERIN 0.4 MG SL tablet Commonly known as: Nitrostat Place 1 tablet (0.4 mg total) under the tongue every 5 (five) minutes as needed for chest pain (up to 3 doses).   pantoprazole 40 MG tablet Commonly known as: PROTONIX Take 1 tablet (40 mg total) by mouth 2 (two) times daily.   umeclidinium-vilanterol 62.5-25 MCG/INH Aepb Commonly known as: ANORO ELLIPTA Inhale 1 puff into the lungs daily.          Outstanding Labs/Studies   Lipids in 6 weeks  Duration of Discharge Encounter    Greater than 30 minutes including physician time.  Signed, Tami Lin Elexis Pollak, PA 07/21/2019, 8:22 AM

## 2019-07-24 DIAGNOSIS — Z23 Encounter for immunization: Secondary | ICD-10-CM | POA: Diagnosis not present

## 2019-08-01 NOTE — Progress Notes (Signed)
Cardiology Office Note  Date: 08/02/2019   ID: Bill Johnson, DOB [birth year], MRN [id number]  PCP:  Alycia Rossetti, MD  Cardiologist:  Kate Sable, MD Electrophysiologist:  None   Chief Complaint: F/U CAD status post cardiac catheterization 07/24/2019.  History of Present Illness: Bill Johnson is a 64 y.o. male with a history of chest pain, smoking, GERD, HLD, alcohol abuse.  Last encounter with me on 06/29/2019 he admitted experiencing exertional fatigue with intermittent left-sided chest pains occurring when up walking up hill,  using a chainsaw, or other exertional activities. Had a recent Cardiac CT demonstrating a possible flow limiting lesion in mLAD and prx. superior branch of OM. A cardiac catheterization was scheduled for June 9 with Dr Martinique. Cardiac cath 07/05/2019 with DES x 2 to the ostial to mid LAD.  He presented to [facility] ED 07/19/2019 with recurrent chest pain on and off  onset 4 days prior to presentation. On Sunday prior to presentation had one episode of stabbing left chest pain which quickly resolved. On 07/19/2019 around 1100 he had a similar pain and had relief with NTG. Multiple episodes approximately 3 hours later and took another NTG. While in ED patient had another episode of chest pain which resolved with NTG. Initially pain thought possibly d/t jailed diagonal from previous stenting to LAD.   He was transferred to [facility] 07/20/2019 with repeat catheterization with DES to OM. Has was to be observed overnight and and discharged to home on 07/21/2019.  Patient is here post cardiac catheterization follow-up.  States he still having some occasional sharp chest pains which he described as not necessarily associated with activity, but can happen during activity such as mowing his lawn.  Otherwise he denies any progressive exertional symptoms.  States he did stop smoking and stop drinking.  He denies any PND, orthopnea, neck, arm, back, jaw pain on exertion.  Denies  any nausea, vomiting or diaphoresis on exertion.  No CVA or TIA-like symptoms, claudication-like symptoms, DVT or PE-like symptoms, or lower extremity edema.   Past Medical History:  Diagnosis Date  . Alcohol abuse   . Allergy    seasonal  . Anxiety   . CAD (coronary artery disease)    a. 06/2019 cath - s/p successful PCI of the ostial to mid LAD with IVUS guidance and DES x 2.  . Depression   . Essential hypertension   . GERD (gastroesophageal reflux disease)   . Hyperlipidemia   . Hypertension   . OA (osteoarthritis) of knee   . Pulmonary nodules    a. seen on coronary CT 05/2019, will need OP f/u.  Marland Kitchen Rotator cuff tear arthropathy of both shoulders    On DISABILITY, non operative case  . Shoulder pain   . Tobacco abuse   . Vertigo     Past Surgical History:  Procedure Laterality Date  . CARDIAC CATHETERIZATION  06/2005   no significant CAD  . COLONOSCOPY WITH PROPOFOL N/A 05/01/2019   Procedure: COLONOSCOPY WITH PROPOFOL;  Surgeon: Daneil Dolin, MD;  Location: [facility];  Service: Endoscopy;  Laterality: N/A;  7:30am  . CORONARY STENT INTERVENTION N/A 07/05/2019   Procedure: CORONARY STENT INTERVENTION;  Surgeon: Martinique, Peter M, MD;  Location: [place] [facility];  Service: Cardiovascular;  Laterality: N/A;  . CORONARY STENT INTERVENTION N/A 07/20/2019   Procedure: CORONARY STENT INTERVENTION;  Surgeon: Jettie Booze, MD;  Location: [place] [facility];  Service: Cardiovascular;  Laterality: N/A;  .  ESOPHAGOGASTRODUODENOSCOPY  08/2005   Dr. Henrene Pastor: small hiatal hernia  . HERNIA REPAIR  1983  . INTRAVASCULAR PRESSURE WIRE/FFR STUDY N/A 07/05/2019   Procedure: INTRAVASCULAR PRESSURE WIRE/FFR STUDY;  Surgeon: Martinique, Peter M, MD;  Location: [place] [facility];  Service: Cardiovascular;  Laterality: N/A;  . INTRAVASCULAR ULTRASOUND/IVUS N/A 07/05/2019   Procedure: Intravascular Ultrasound/IVUS;  Surgeon: Martinique, Peter M, MD;  Location: [place] [facility];  Service:  Cardiovascular;  Laterality: N/A;  . LEFT HEART CATH AND CORONARY ANGIOGRAPHY N/A 07/05/2019   Procedure: LEFT HEART CATH AND CORONARY ANGIOGRAPHY;  Surgeon: Martinique, Peter M, MD;  Location: [place] [facility];  Service: Cardiovascular;  Laterality: N/A;  . LEFT HEART CATH AND CORONARY ANGIOGRAPHY N/A 07/20/2019   Procedure: LEFT HEART CATH AND CORONARY ANGIOGRAPHY;  Surgeon: Jettie Booze, MD;  Location: [place] [facility];  Service: Cardiovascular;  Laterality: N/A;  . POLYPECTOMY  05/01/2019   Procedure: POLYPECTOMY;  Surgeon: Daneil Dolin, MD;  Location: [facility];  Service: Endoscopy;;  . ROTATOR CUFF REPAIR Bilateral   . TONSILLECTOMY  1977    Current Outpatient Medications  Medication Sig Dispense Refill  . acetaminophen (TYLENOL) 500 MG tablet Take 500 mg by mouth every 6 (six) hours as needed for mild pain or headache.     . albuterol (PROVENTIL HFA;VENTOLIN HFA) 108 (90 Base) MCG/ACT inhaler Inhale 2 puffs into the lungs every 6 (six) hours as needed for wheezing or shortness of breath. 1 Inhaler 2  . amLODipine (NORVASC) 5 MG tablet Take 1 tablet (5 mg total) by mouth daily. 90 tablet 3  . aspirin 81 MG tablet Take 81 mg by mouth daily.    Marland Kitchen atenolol (TENORMIN) 50 MG tablet Take 1 tablet (50 mg total) by mouth daily. 90 tablet 3  . atorvastatin (LIPITOR) 80 MG tablet Take 1 tablet (80 mg total) by mouth every evening. 30 tablet 6  . clopidogrel (PLAVIX) 75 MG tablet Take 1 tablet (75 mg total) by mouth daily. 30 tablet 11  . CVS VITAMIN B-12 2000 MCG TBCR Take 2,000 mcg by mouth daily.    . diazepam (VALIUM) 5 MG tablet Take 1 tablet (5 mg total) by mouth every 12 (twelve) hours as needed for anxiety. 45 tablet 1  . meclizine (ANTIVERT) 25 MG tablet TAKE 1 TABLET BY MOUTH THREE TIMES DAILY AS NEEDED FOR DIZZINESS 90 tablet 2  . nitroGLYCERIN (NITROSTAT) 0.4 MG SL tablet Place 1 tablet (0.4 mg total) under the tongue every 5 (five) minutes as needed for chest pain (up to 3  doses). 25 tablet 3  . pantoprazole (PROTONIX) 40 MG tablet Take 1 tablet (40 mg total) by mouth 2 (two) times daily. 60 tablet 6  . umeclidinium-vilanterol (ANORO ELLIPTA) 62.5-25 MCG/INH AEPB Inhale 1 puff into the lungs daily. 1 each 11  . isosorbide mononitrate (IMDUR) 30 MG 24 hr tablet Take 1 tablet (30 mg total) by mouth daily. 90 tablet 1   No current facility-administered medications for this visit.   Allergies:  Patient has no known allergies.   Social History: The patient  reports that he quit smoking about 4 weeks ago. His smoking use included cigarettes. He started smoking about 49 years ago. He has a 43.00 pack-year smoking history. He has never used smokeless tobacco. He reports current alcohol use. He reports that he does not use drugs.   Family History: The patient's family history includes Cancer in his brother and another family member; Diabetes in his  mother; Heart disease in his father and mother; Hypertension in his father and mother; Stroke in an other family member.   ROS:  Please see the history of present illness. Otherwise, complete review of systems is positive for none.  All other systems are reviewed and negative.   Physical Exam: VS:  BP 116/64   Pulse (!) 58   Ht 5\' 4"  (1.626 m)   Wt 168 lb 6.4 oz (76.4 kg)   SpO2 99%   BMI 28.91 kg/m , BMI Body mass index is 28.91 kg/m.  Wt Readings from Last 3 Encounters:  08/02/19 168 lb 6.4 oz (76.4 kg)  07/21/19 160 lb 12.8 oz (72.9 kg)  07/06/19 164 lb 7.1 oz (74.6 kg)    General: Patient appears comfortable at rest. Neck: Supple, no elevated JVP or carotid bruits, no thyromegaly. Lungs: Clear to auscultation, nonlabored breathing at rest. Cardiac: Regular rate and rhythm, no S3 or significant systolic murmur, no pericardial rub. Extremities: No pitting edema, distal pulses 2+. Skin: Warm and dry.  Right radial cardiac catheterization at access site clean and dry Musculoskeletal: No  kyphosis. Neuropsychiatric: Alert and oriented x3, affect grossly appropriate.  ECG:  EKG 07/20/2019 sinus bradycardia rate of 55 bpm  Recent Labwork: 03/18/2019: ALT 12; AST 20 07/21/2019: BUN 6; Creatinine, Ser 0.84; Hemoglobin 12.5; Platelets 208; Potassium 3.7; Sodium 140     Component Value Date/Time   CHOL 249 (H) 03/19/2019 0834   TRIG 196 (H) 03/19/2019 0834   HDL 57 03/19/2019 0834   CHOLHDL 4.4 03/19/2019 0834   VLDL 39 03/19/2019 0834   LDLCALC 153 (H) 03/19/2019 0834   LDLCALC 108 (H) 01/02/2019 1432    Other Studies Reviewed Today:   Echocardiogram: 02/2019 IMPRESSIONS  1. Left ventricular ejection fraction, by estimation, is 55 to 60%. The  left ventricle has normal function. The left ventricle has no regional  wall motion abnormalities. Left ventricular diastolic parameters were  normal.  2. Right ventricular systolic function is normal. The right ventricular  size is normal. Tricuspid regurgitation signal is inadequate for assessing  PA pressure.  3. Left atrial size was upper normal.  4. Right atrial size was upper normal.  5. The mitral valve is grossly normal. Trivial mitral valve  regurgitation.  6. The aortic valve is tricuspid. Aortic valve regurgitation is mild.  7. The inferior vena cava is normal in size with greater than 50%  respiratory variability, suggesting right atrial pressure of 3 mmHg  Cardiac catheterization 07/05/2019   Prox LAD to Mid LAD lesion is 70% stenosed.  A drug-eluting stent was successfully placed using a STENT RESOLUTE ONYX 3.0X22.  Post intervention, there is a 0% residual stenosis.  Mid LAD lesion is 50% stenosed.  A drug-eluting stent was successfully placed using a STENT RESOLUTE ONYX 2.5X30.  Post intervention, there is a 0% residual stenosis.  1st Mrg lesion is 95% stenosed.  The left ventricular systolic function is normal.  LV end diastolic pressure is normal.  The left ventricular ejection fraction  is 55-65% by visual estimate.   1. Severe 2 vessel obstructive CAD. Abnormal FFR of the LAD. 2. Normal LV function 3. Normal LVEDP 4. Successful PCI of the ostial to mid LAD with IVUS guidance and DES x 2.   Plan: DAPT for one year. Optimize medical therapy. Smoking cessation. If patient continues to have symptoms despite optimal therapy could consider PCI of the OM but this is a small branch and supplies a relatively small area of  myocardium.  Diagnostic Dominance: Right   Intervention      Cardiac catheterization 07/20/2019   Previously placed Mid LAD drug eluting stent is widely patent.  Previously placed Prox LAD to Mid LAD drug eluting stent is widely patent.  1st Mrg lesion is 95% stenosed.  A drug-eluting stent was successfully placed using a STENT RESOLUTE ONYX 2.25X26, postdilated to 2.6 mm.  Post intervention, there is a 0% residual stenosis.  The left ventricular systolic function is normal.  LV end diastolic pressure is normal.  The left ventricular ejection fraction is 55-65% by visual estimate.  There is no aortic valve stenosis.   Continue aggressive secondary prevention.  Consider clopidogrel monotherapy after 12 months.   Watch overnight.  Home tomorrow.   He is interested in getting the COVID-19 vaccine.  I encouraged him to get the vaccines when possible.  Diagnostic Dominance: Right  Intervention        Assessment and Plan:   1. CAD in native artery Status post DES to proximal to mid LAD.  DES to first marginal lesion.  Continues to complain of chest pain not necessarily associated with exertion.  States it can happen with or without activity.  States she does not understand it.  He denies any radiation to neck, arm, back, or jaw.  Denies any associated nausea, vomiting, or diaphoresis.  Start Imdur 30 mg daily to see if this may help alleviate the pain.  Patient states he has severe gastric reflux disease and is scheduled for an EGD  soon.  He is wondering if there may be something in his abdomen which may be causing the chest pain.  Continue aspirin 81 mg daily, Plavix 75 mg daily, nitroglycerin sublingual as needed for chest pain.  2. Essential hypertension Blood pressure is well controlled on current therapy.  Blood pressure today 116/64, heart rate of 58.  Continue amlodipine 5 mg daily, atenolol 50 mg daily  3. Mixed hyperlipidemia Continue atorvastatin 80 mg daily.  4. Tobacco abuse Patient states he stopped smoking and drinking alcohol on June 8.   Medication Adjustments/Labs and Tests Ordered: Current medicines are reviewed at length with the patient today.  Concerns regarding medicines are outlined above.   Disposition: Follow-up with Dr. Bronson Ing or [facility] 6 months  Signed, [name] July, NP 08/02/2019 11:08 AM    [facility], [place], [place] [address] Phone: [phone]; Fax: [phone]

## 2019-08-02 ENCOUNTER — Ambulatory Visit (INDEPENDENT_AMBULATORY_CARE_PROVIDER_SITE_OTHER): Payer: Medicare Other | Admitting: Family Medicine

## 2019-08-02 ENCOUNTER — Encounter: Payer: Self-pay | Admitting: Family Medicine

## 2019-08-02 VITALS — BP 116/64 | HR 58 | Ht 64.0 in | Wt 168.4 lb

## 2019-08-02 DIAGNOSIS — I10 Essential (primary) hypertension: Secondary | ICD-10-CM | POA: Diagnosis not present

## 2019-08-02 DIAGNOSIS — Z720 Tobacco use: Secondary | ICD-10-CM | POA: Diagnosis not present

## 2019-08-02 DIAGNOSIS — E782 Mixed hyperlipidemia: Secondary | ICD-10-CM | POA: Diagnosis not present

## 2019-08-02 DIAGNOSIS — I2510 Atherosclerotic heart disease of native coronary artery without angina pectoris: Secondary | ICD-10-CM

## 2019-08-02 MED ORDER — ISOSORBIDE MONONITRATE ER 30 MG PO TB24
30.0000 mg | ORAL_TABLET | Freq: Every day | ORAL | 1 refills | Status: DC
Start: 2019-08-02 — End: 2020-01-24

## 2019-08-02 NOTE — Patient Instructions (Addendum)
Your physician wants you to follow-up in: 6 MONTHS  You will receive a reminder letter in the mail two months in advance. If you don't receive a letter, please call our office to schedule the follow-up appointment.  Your physician has recommended you make the following change in your medication:   START IMDUR 30 MG DAILY  Thank you for choosing [facility]!!

## 2019-08-04 ENCOUNTER — Encounter: Payer: Self-pay | Admitting: Family Medicine

## 2019-08-04 ENCOUNTER — Ambulatory Visit (INDEPENDENT_AMBULATORY_CARE_PROVIDER_SITE_OTHER): Payer: Medicare Other | Admitting: Family Medicine

## 2019-08-04 ENCOUNTER — Other Ambulatory Visit: Payer: Self-pay

## 2019-08-04 DIAGNOSIS — I10 Essential (primary) hypertension: Secondary | ICD-10-CM

## 2019-08-04 DIAGNOSIS — J439 Emphysema, unspecified: Secondary | ICD-10-CM

## 2019-08-04 DIAGNOSIS — I2510 Atherosclerotic heart disease of native coronary artery without angina pectoris: Secondary | ICD-10-CM

## 2019-08-04 DIAGNOSIS — F411 Generalized anxiety disorder: Secondary | ICD-10-CM | POA: Diagnosis not present

## 2019-08-04 MED ORDER — PANTOPRAZOLE SODIUM 40 MG PO TBEC: 40.0000 mg | DELAYED_RELEASE_TABLET | Freq: Two times a day (BID) | ORAL | 2 refills | Status: DC

## 2019-08-04 MED ORDER — CLOPIDOGREL BISULFATE 75 MG PO TABS: 75.0000 mg | ORAL_TABLET | Freq: Every day | ORAL | 2 refills | Status: DC

## 2019-08-04 MED ORDER — ATORVASTATIN CALCIUM 80 MG PO TABS: 80.0000 mg | ORAL_TABLET | Freq: Every evening | ORAL | 2 refills | Status: DC

## 2019-08-04 MED ORDER — DIAZEPAM 5 MG PO TABS: 5.0000 mg | ORAL_TABLET | Freq: Two times a day (BID) | ORAL | 2 refills | Status: DC | PRN

## 2019-08-04 NOTE — Patient Instructions (Addendum)
Lab visit - fasting in 1 month  F/U 4 months for Physical

## 2019-08-04 NOTE — Assessment & Plan Note (Signed)
Continue inhalers, no recent exacerbations

## 2019-08-04 NOTE — Progress Notes (Signed)
   Subjective:    Patient ID: Bill Johnson, male    DOB: [birth year], 64 y.o.   MRN: [id number]  Patient presents for Hospital F/U (cardiac stint x3)   Pt here for hospital follow up. He had 3 stents placed over the past month. He has noticed a chnge in energy levels and his breathing, chest pain has resolved.  He is on both ASA and plavix  He was started on Imdur for chest pain Lipitor was incresaed to  80mg  once a day   HTN- he is still on atenolol and amlodipine without any difficulty.  He requests a refill on his Valium that he uses for his anxiety and sleep as needed.  New concerns today. He is doing all his regular activities outside doing yard work. States that he does not require cardiac rehab  Review Of Systems:  GEN- denies fatigue, fever, weight loss,weakness, recent illness HEENT- denies eye drainage, change in vision, nasal discharge, CVS- denies chest pain, palpitations RESP- denies SOB, cough, wheeze ABD- denies N/V, change in stools, abd pain GU- denies dysuria, hematuria, dribbling, incontinence MSK-+ joint pain, muscle aches, injury Neuro- denies headache, dizziness, syncope, seizure activity       Objective:    BP 128/68   Pulse 70   Temp 98.1 F (36.7 C) (Temporal)   Resp 16   Ht 5\' 4"  (1.626 m)   Wt 168 lb (76.2 kg)   SpO2 95%   BMI 28.84 kg/m  GEN- NAD, alert and oriented x3 HEENT- PERRL, EOMI, non injected sclera, pink conjunctiva, MMM, oropharynx clear Neck- Supple, no thyromegaly CVS- RRR, no murmur RESP-CTAB ABD-NABS,soft,NT,ND Psych normal affect and mood  EXT- No edema Pulses- Radial 2+        Assessment & Plan:      Problem List Items Addressed This Visit      Unprioritized   CAD, multiple vessel    He is on both plavix and ASA, no abnormal bleeding Recently added IMDUR Feels good, off tobacco and ETOH      Relevant Medications   atorvastatin (LIPITOR) 80 MG tablet   COPD (chronic obstructive pulmonary disease)  (HCC)    Continue inhalers, no recent exacerbations      GAD (generalized anxiety disorder)    Continue prn valium      Relevant Medications   diazepam (VALIUM) 5 MG tablet   Hypertension    Controlled no changes  Return for fasting labs in 1 month      Relevant Medications   atorvastatin (LIPITOR) 80 MG tablet      Note: This dictation was prepared with Dragon dictation along with smaller phrase technology. Any transcriptional errors that result from this process are unintentional.

## 2019-08-04 NOTE — Assessment & Plan Note (Signed)
Continue prn valium. 

## 2019-08-04 NOTE — Assessment & Plan Note (Signed)
He is on both plavix and ASA, no abnormal bleeding Recently added IMDUR Feels good, off tobacco and ETOH

## 2019-08-04 NOTE — Assessment & Plan Note (Addendum)
Controlled no changes  Return for fasting labs in 1 month

## 2019-08-21 DIAGNOSIS — Z23 Encounter for immunization: Secondary | ICD-10-CM | POA: Diagnosis not present

## 2019-09-01 ENCOUNTER — Other Ambulatory Visit: Payer: Medicare Other

## 2019-09-01 ENCOUNTER — Other Ambulatory Visit: Payer: Self-pay

## 2019-09-01 DIAGNOSIS — I10 Essential (primary) hypertension: Secondary | ICD-10-CM

## 2019-09-01 DIAGNOSIS — I2510 Atherosclerotic heart disease of native coronary artery without angina pectoris: Secondary | ICD-10-CM | POA: Diagnosis not present

## 2019-09-02 LAB — LIPID PANEL
Cholesterol: 149 mg/dL (ref <200)
HDL: 59 mg/dL (ref >=40)
LDL Cholesterol (Calc): 71 mg/dL (calc)
Non-HDL Cholesterol (Calc): 90 mg/dL (calc) (ref <130)
Total CHOL/HDL Ratio: 2.5 (calc) (ref <5.0)
Triglycerides: 104 mg/dL (ref <150)

## 2019-09-02 LAB — COMPREHENSIVE METABOLIC PANEL
AG Ratio: 1.3 (calc) (ref 1.0–2.5)
ALT: 15 U/L (ref 9–46)
AST: 18 U/L (ref 10–35)
Albumin: 4 g/dL (ref 3.6–5.1)
Alkaline phosphatase (APISO): 77 U/L (ref 35–144)
BUN: 9 mg/dL (ref 7–25)
CO2: 21 mmol/L (ref 20–32)
Calcium: 8.5 mg/dL — ABNORMAL LOW (ref 8.6–10.3)
Chloride: 108 mmol/L (ref 98–110)
Creat: 0.88 mg/dL (ref 0.70–1.25)
Globulin: 3.2 g/dL (calc) (ref 1.9–3.7)
Glucose, Bld: 93 mg/dL (ref 65–99)
Potassium: 4.6 mmol/L (ref 3.5–5.3)
Sodium: 138 mmol/L (ref 135–146)
Total Bilirubin: 0.7 mg/dL (ref 0.2–1.2)
Total Protein: 7.2 g/dL (ref 6.1–8.1)

## 2019-09-06 ENCOUNTER — Encounter: Payer: Self-pay | Admitting: *Deleted

## 2019-09-06 ENCOUNTER — Ambulatory Visit: Payer: Medicare Other | Admitting: Cardiovascular Disease

## 2019-09-15 ENCOUNTER — Other Ambulatory Visit: Payer: Self-pay | Admitting: Family Medicine

## 2019-09-26 ENCOUNTER — Ambulatory Visit: Payer: Medicare Other | Admitting: Nurse Practitioner

## 2019-09-28 ENCOUNTER — Other Ambulatory Visit: Payer: Self-pay | Admitting: Family Medicine

## 2019-09-28 DIAGNOSIS — J439 Emphysema, unspecified: Secondary | ICD-10-CM

## 2019-09-28 MED ORDER — UMECLIDINIUM-VILANTEROL 62.5-25 MCG/INH IN AEPB: 1.0000 | INHALATION_SPRAY | Freq: Every day | RESPIRATORY_TRACT | 11 refills | Status: DC

## 2019-09-28 NOTE — Telephone Encounter (Signed)
Prescription sent to pharmacy.

## 2019-09-28 NOTE — Telephone Encounter (Signed)
Patient came by requesting a refill on his anoro Ellipta called into [facility] in [place].  CB# [phone]

## 2019-10-26 ENCOUNTER — Encounter: Payer: Self-pay | Admitting: Nurse Practitioner

## 2019-10-26 ENCOUNTER — Ambulatory Visit (INDEPENDENT_AMBULATORY_CARE_PROVIDER_SITE_OTHER): Payer: Medicare Other | Admitting: Nurse Practitioner

## 2019-10-26 ENCOUNTER — Other Ambulatory Visit: Payer: Self-pay

## 2019-10-26 DIAGNOSIS — K5900 Constipation, unspecified: Secondary | ICD-10-CM

## 2019-10-26 DIAGNOSIS — D128 Benign neoplasm of rectum: Secondary | ICD-10-CM

## 2019-10-26 DIAGNOSIS — I2510 Atherosclerotic heart disease of native coronary artery without angina pectoris: Secondary | ICD-10-CM

## 2019-10-26 DIAGNOSIS — R197 Diarrhea, unspecified: Secondary | ICD-10-CM | POA: Diagnosis not present

## 2019-10-26 NOTE — Progress Notes (Signed)
Referring Provider: Alycia Rossetti, MD Primary Care Physician:  Alycia Rossetti, MD Primary GI:  Dr. Gala Romney  Chief Complaint  Patient presents with  . Colonoscopy    loose to watery dark stools    HPI:   Bill Johnson is a 64 y.o. male who presents to schedule 11-month follow-up colonoscopy.  The patient was last seen in our office 03/29/2019 for GERD, left upper quadrant pain, abdominal distention.  Noted history of chronic GERD, hard of hearing.  Emergency department visit in February for chest pain with negative troponins.  Noted longstanding chronic history of GERD since teenage years.  EGD in August 2007 with no evidence of persistent reflux esophagitis that was found initially in June 2007.  Noted small hiatal hernia.  Protonix 40 mg daily while vaccine to help.  Notes prior to recent hospitalization not caring for himself very well.  PPIs increased to twice daily by primary care.  He did admit "power drinking" without eating and drinking beer only prior to hospitalization.  A lot of bloating and gas, wakes up choking.  Since hospitalization has been trying to eat right and cutting back on smoking and alcohol.  He waits at least 2 hours prior to laying down after eating which helps him feel somewhat better.  Declines colonoscopy.  Recommended continue PPI, EGD, CT scan of the abdomen.  CT of the abdomen and pelvis completed 04/14/2019 which found eccentric nodular focus of wall thickening in the right rectal wall recommend correlation with colonoscopy to exclude neoplasm.  Scattered lung base nodules with recommended follow-up chest CT in 12 months, otherwise no abnormality.  The patient was subsequently scheduled for a colonoscopy on 05/01/2019.  Colonoscopy was completed 05/01/2019 which found grade 3 hemorrhoids, three 5 to 8 mm polyps in the sigmoid colon and splenic flexure, a 12 mm polyp at the ileocecal valve, a 30 mm polyp in the distal rectum status post piecemeal resection, DC  ablation, clip placement.  Diverticulosis in the sigmoid and descending colon.  Otherwise normal.  Recommended repeat colonoscopy in 6 months.  No MRI until clip scarring.  Surgical pathology found the polyps to be tubular adenoma except for the large rectal polyp which was tubulovillous adenoma.  Today he states he is doing okay overall. He has bristol 2 stools and after he passes a couple stools he'll have bristol 6 stools; total 5-8 stools a day. Notes rectal discomfort ("nothing major"). Denies abdominal pain, N/V, hematochezia, melena, fever, chills, unintentional weight loss. Denies URI or flu-like symptoms. Denies loss of sense of taste or smell. The patient has received COVID-19 vaccination(s). Denies chest pain, dyspnea, dizziness, lightheadedness, syncope, near syncope. Denies any other upper or lower GI symptoms.  Had a cardiac stent placed 07/05/19 and 07/20/19. Placed on DAPT for 12 months then consider Plavix monotherapy after 12 months.   Past Medical History:  Diagnosis Date  . Alcohol abuse   . Allergy    seasonal  . Anxiety   . CAD (coronary artery disease)    a. 06/2019 cath - s/p successful PCI of the ostial to mid LAD with IVUS guidance and DES x 2.  . Depression   . Essential hypertension   . GERD (gastroesophageal reflux disease)   . Hyperlipidemia   . Hypertension   . OA (osteoarthritis) of knee   . Pulmonary nodules    a. seen on coronary CT 05/2019, will need OP f/u.  Marland Kitchen Rotator cuff tear arthropathy of both shoulders  On DISABILITY, non operative case  . Shoulder pain   . Tobacco abuse   . Vertigo     Past Surgical History:  Procedure Laterality Date  . CARDIAC CATHETERIZATION  06/2005   no significant CAD  . COLONOSCOPY WITH PROPOFOL N/A 05/01/2019   Procedure: COLONOSCOPY WITH PROPOFOL;  Surgeon: Daneil Dolin, MD;  [facility];  Service: Endoscopy;  Laterality: N/A;  7:30am  . CORONARY STENT INTERVENTION N/A 07/05/2019   Procedure: CORONARY STENT  INTERVENTION;  Surgeon: Martinique, Peter M, MD;  Location: [facility];  Service: Cardiovascular;  Laterality: N/A;  . CORONARY STENT INTERVENTION N/A 07/20/2019   Procedure: CORONARY STENT INTERVENTION;  Surgeon: Jettie Booze, MD;  Location: [facility];  Service: Cardiovascular;  Laterality: N/A;  . ESOPHAGOGASTRODUODENOSCOPY  08/2005   Dr. Henrene Pastor: small hiatal hernia  . HERNIA REPAIR  1983  . INTRAVASCULAR PRESSURE WIRE/FFR STUDY N/A 07/05/2019   Procedure: INTRAVASCULAR PRESSURE WIRE/FFR STUDY;  Surgeon: Martinique, Peter M, MD;  Location: [place];  Service: Cardiovascular;  Laterality: N/A;  . INTRAVASCULAR ULTRASOUND/IVUS N/A 07/05/2019   Procedure: Intravascular Ultrasound/IVUS;  Surgeon: Martinique, Peter M, MD;  Location: [facility];  Service: Cardiovascular;  Laterality: N/A;  . LEFT HEART CATH AND CORONARY ANGIOGRAPHY N/A 07/05/2019   Procedure: LEFT HEART CATH AND CORONARY ANGIOGRAPHY;  Surgeon: Martinique, Peter M, MD;  Location: [facility];  Service: Cardiovascular;  Laterality: N/A;  . LEFT HEART CATH AND CORONARY ANGIOGRAPHY N/A 07/20/2019   Procedure: LEFT HEART CATH AND CORONARY ANGIOGRAPHY;  Surgeon: Jettie Booze, MD;  Location: [facility];  Service: Cardiovascular;  Laterality: N/A;  . POLYPECTOMY  05/01/2019   Procedure: POLYPECTOMY;  Surgeon: Daneil Dolin, MD;  [facility];  Service: Endoscopy;;  . ROTATOR CUFF REPAIR Bilateral   . TONSILLECTOMY  1977    Current Outpatient Medications  Medication Sig Dispense Refill  . acetaminophen (TYLENOL) 500 MG tablet Take 500 mg by mouth as needed for mild pain or headache.     . albuterol (PROVENTIL HFA;VENTOLIN HFA) 108 (90 Base) MCG/ACT inhaler Inhale 2 puffs into the lungs every 6 (six) hours as needed for wheezing or shortness of breath. 1 Inhaler 2  . amLODipine (NORVASC) 5 MG tablet Take 1 tablet (5 mg total) by mouth daily. 90 tablet 3  . aspirin 81 MG tablet Take 81 mg  by mouth daily.    Marland Kitchen atenolol (TENORMIN) 50 MG tablet Take 1 tablet by mouth once daily 90 tablet 0  . atorvastatin (LIPITOR) 80 MG tablet Take 1 tablet (80 mg total) by mouth every evening. 90 tablet 2  . clopidogrel (PLAVIX) 75 MG tablet Take 1 tablet (75 mg total) by mouth daily. 90 tablet 2  . CVS VITAMIN B-12 2000 MCG TBCR Take 2,000 mcg by mouth daily.    . diazepam (VALIUM) 5 MG tablet Take 1 tablet (5 mg total) by mouth every 12 (twelve) hours as needed for anxiety. 45 tablet 2  . isosorbide mononitrate (IMDUR) 30 MG 24 hr tablet Take 1 tablet (30 mg total) by mouth daily. 90 tablet 1  . meclizine (ANTIVERT) 25 MG tablet TAKE 1 TABLET BY MOUTH THREE TIMES DAILY AS NEEDED FOR DIZZINESS 90 tablet 2  . nitroGLYCERIN (NITROSTAT) 0.4 MG SL tablet Place 1 tablet (0.4 mg total) under the tongue every 5 (five) minutes as needed for chest pain (up to 3 doses). 25 tablet 3  .  pantoprazole (PROTONIX) 40 MG tablet Take 1 tablet (40 mg total) by mouth 2 (two) times daily. 180 tablet 2  . umeclidinium-vilanterol (ANORO ELLIPTA) 62.5-25 MCG/INH AEPB Inhale 1 puff into the lungs daily. 1 each 11   No current facility-administered medications for this visit.    Allergies as of 10/26/2019  . (No Known Allergies)    Family History  Problem Relation Age of Onset  . Heart disease Mother   . Diabetes Mother   . Hypertension Mother   . Heart disease Father   . Hypertension Father   . Stroke Other   . Cancer Other   . Cancer Brother        prostate  . Colon cancer Neg Hx     Social History   Socioeconomic History  . Marital status: Single    Spouse name: Not on file  . Number of children: 2  . Years of education: 53  . Highest education level: Not on file  Occupational History  . Occupation: Film/video editor Designer, television/film set)  Tobacco Use  . Smoking status: Current Every Day Smoker    Packs/day: 1.00    Years: 43.00    Pack years: 43.00    Types: Cigarettes    Start date: 04/15/1970    Last  attempt to quit: 07/04/2019    Years since quitting: 0.3  . Smokeless tobacco: Never Used  . Tobacco comment: 1 pack a week  Vaping Use  . Vaping Use: Never used  Substance and Sexual Activity  . Alcohol use: Yes    Comment: Drinks 3-4 beers/day   . Drug use: No  . Sexual activity: Yes  Other Topics Concern  . Not on file  Social History Narrative   Lives at home with nephew and niece in-law   Caffeine use: Drinks coffee/soda every day   Social Determinants of Health   Financial Resource Strain:   . Difficulty of Paying Living Expenses: Not on file  Food Insecurity:   . Worried About Charity fundraiser in the Last Year: Not on file  . Ran Out of Food in the Last Year: Not on file  Transportation Needs:   . Lack of Transportation (Medical): Not on file  . Lack of Transportation (Non-Medical): Not on file  Physical Activity:   . Days of Exercise per Week: Not on file  . Minutes of Exercise per Session: Not on file  Stress:   . Feeling of Stress : Not on file  Social Connections:   . Frequency of Communication with Friends and Family: Not on file  . Frequency of Social Gatherings with Friends and Family: Not on file  . Attends Religious Services: Not on file  . Active Member of Clubs or Organizations: Not on file  . Attends Archivist Meetings: Not on file  . Marital Status: Not on file    Subjective: Review of Systems  Constitutional: Negative for chills, fever, malaise/fatigue and weight loss.  HENT: Negative for congestion and sore throat.   Respiratory: Negative for cough and shortness of breath.   Cardiovascular: Negative for chest pain and palpitations.  Gastrointestinal: Positive for constipation and diarrhea. Negative for abdominal pain, blood in stool, melena, nausea and vomiting.  Musculoskeletal: Negative for joint pain and myalgias.  Skin: Negative for rash.  Neurological: Negative for dizziness and weakness.  Endo/Heme/Allergies: Does not  bruise/bleed easily.  Psychiatric/Behavioral: Negative for depression. The patient is not nervous/anxious.   All other systems reviewed and are negative.  Objective: BP 123/69   Pulse 64   Temp (!) 97.5 F (36.4 C) (Oral)   Ht 5\' 4"  (1.626 m)   Wt 172 lb 3.2 oz (78.1 kg)   BMI 29.56 kg/m  Physical Exam Vitals and nursing note reviewed.  Constitutional:      General: He is not in acute distress.    Appearance: Normal appearance. He is normal weight. He is not ill-appearing, toxic-appearing or diaphoretic.  HENT:     Head: Normocephalic and atraumatic.     Nose: No congestion or rhinorrhea.  Eyes:     General: No scleral icterus. Cardiovascular:     Rate and Rhythm: Normal rate and regular rhythm.     Heart sounds: Normal heart sounds.  Pulmonary:     Effort: Pulmonary effort is normal.     Breath sounds: Normal breath sounds.  Abdominal:     General: Bowel sounds are normal. There is no distension.     Palpations: Abdomen is soft. There is no hepatomegaly, splenomegaly or mass.     Tenderness: There is no abdominal tenderness. There is no guarding or rebound.     Hernia: No hernia is present.  Musculoskeletal:     Cervical back: Neck supple.  Skin:    General: Skin is warm and dry.     Coloration: Skin is not jaundiced.     Findings: No bruising or rash.  Neurological:     General: No focal deficit present.     Mental Status: He is alert and oriented to person, place, and time. Mental status is at baseline.  Psychiatric:        Mood and Affect: Mood normal.        Behavior: Behavior normal.        Thought Content: Thought content normal.      Assessment:  Very pleasant 64 year old male who presents for follow-up and scheduling of 2-month interval colonoscopy due to significantly large tubulovillous adenoma in the rectum as per HPI.  Today he is also complaining of what sounds to be diarrhea, although he states his first couple stools are consistently Taylor 2.   His care has been complicated by a recent cardiac cath after his last colonoscopy where they placed 2 stents and plan for DAPT for 12 months and Plavix monotherapy afterward.  This will require some maneuvering and coordination between [place] and cardiology to accomplish his surveillance.  Diarrhea: The patient initially was complaining of diarrhea, but upon further discussion he states his first couple stools are typically hard stools consistently Bristol 2 and followed by diarrhea for 3-5 total stools a day.  Likely constipation with some overflow diarrhea.  He did just recently have a colonoscopy and this is reassuring.  At this point I will have him start a fiber supplement once a day for 2 weeks and increase to twice a day after that as needed.  Call for any worsening or severe symptoms  Large tubulovillous adenoma of rectum: The patient had a 30 mm tubulovillous adenoma in his rectum, along with some other tubular adenomas as outlined in HPI.  This was piecemeal resected and recommended 23-month follow-up.  However, after his colonoscopy he ended up with cardiac stenting x2 and will be on DAPT with aspirin and Plavix for 12 months and then likely Plavix monotherapy afterward.  Normally we would not necessarily need to hold Plavix unless anticipated large polyp.  However, this is somewhat more likely given his recent polyp.  We could try to  attempt just a surveillance to lay eyes on things with no planned intervention.  Cardiology does generally prefer for [place] to wait 6 months minimum after stenting.  I will reach out to cardiology and get their parameters for 1 week and complete this being that it is not emergent but not elective either.  I will discuss with Dr. Gala Romney further recommendations as well   Plan: 1. Fiber supplement once a day for 2 weeks and increase to twice a day 2. Message sent to cardiology to discuss parameters for repeat colonoscopy given large tubulovillous adenoma 3. The patient is  requested that we call his niece for all information and mail any instructions or appointment reminders to him (he is hard of hearing) 4. I will discuss with Dr. Gala Romney when we could conceivably complete his surveillance, based on cardiology recommendations, and need/no need to hold Plavix 5. Further recommendations to follow 6. Return for follow-up in 3 months    Thank you for allowing [place] to participate in the care of [place], DNP, AGNP-C Adult & Gerontological Nurse Practitioner [facility]   10/26/2019 2:16 PM   Disclaimer: This note was dictated with voice recognition software. Similar sounding words can inadvertently be transcribed and may not be corrected upon review.

## 2019-10-26 NOTE — Patient Instructions (Signed)
Your health issues we discussed today were:   Constipation followed by diarrhea: 1. Start taking a fiber supplement once a day.  There are multiple fiber options available.  You can go to the fiber I will the pharmacy and see which option you were most likely to take regularly 2. After 2 weeks, you can increase the fiber to twice daily if needed 3. Call us for any worsening or severe symptoms  Need for repeat colonoscopy: 1. I have sent a message to your cardiology team to discuss when we will be able to do the colonoscopy 2. I will also discuss with Dr. Gala Romney when he returns from out of town 3. We will update your niece and you (via mail) we have a more definitive answer for when we can complete this 4. Call us for any worsening or significant symptoms such as significant bleeding in your stools  Overall I recommend:  1. Continue your other current medications 2. Return for follow-up in 3 months 3. Call us for any questions or concerns   ---------------------------------------------------------------  I am glad you have gotten your COVID-19 vaccination!  Even though you are fully vaccinated you should continue to follow CDC and state/local guidelines.  ---------------------------------------------------------------   At [facility] we value your feedback. You may receive a survey about your visit today. Please share your experience as we strive to create trusting relationships with our patients to provide genuine, compassionate, quality care.  We appreciate your understanding and patience as we review any laboratory studies, imaging, and other diagnostic tests that are ordered as we care for you. Our office policy is 5 business days for review of these results, and any emergent or urgent results are addressed in a timely manner for your best interest. If you do not hear from our office in 1 week, please contact us.   We also encourage the use of MyChart, which contains  your medical information for your review as well. If you are not enrolled in this feature, an access code is on this after visit summary for your convenience. Thank you for allowing [place] to be involved in your care.  It was great to see you today!  I hope you have a great Fall!!

## 2019-10-26 NOTE — Progress Notes (Signed)
CC'ED TO PCP 

## 2019-11-02 ENCOUNTER — Telehealth: Payer: Self-pay | Admitting: Nurse Practitioner

## 2019-11-02 ENCOUNTER — Other Ambulatory Visit: Payer: Self-pay

## 2019-11-02 NOTE — Telephone Encounter (Signed)
Noted. Add to be called once we receive January scheduled

## 2019-11-02 NOTE — Telephone Encounter (Signed)
-----   Message from Arnoldo Lenis, MD sent at 10/30/2019  6:52 PM EDT ----- Regarding: RE: Mutual Patient If can do procedure on plavix then no contraindications from cardiac standpoint   Zandra Abts MD ----- Message ----- From: Verta Ellen., NP Sent: 10/27/2019   8:00 AM EDT To: Arnoldo Lenis, MD, Carlis Stable, NP Subject: RE: Mutual Patient                             Lyna Poser,  I will have to run this by one of the Docs first. I am an NP like you, and I don't want to stop unless I get the go ahead. I will copy Dr Harl Bowie on this and see what he thinks and get back to you. Will let you know as soon as possible. I think given the circumstances there wont be a problem.  ----- Message ----- From: Carlis Stable, NP Sent: 10/26/2019  12:03 PM EDT To: Verta Ellen., NP Subject: Mutual Patient                                 Good Morning!  I have a question about this mutual patient. We did a colonoscopy earlier this year and he had a HUGE (30 mm) advanced precancerous polyp and recommended 6 month surveillance colonoscopy. Well 2 months later he went and got himself a shiny new cardiac stent and will be on DAPT (ASA?Plavix) x 12 months and then Plavix monotherapy after that.  I know we're usually asked to hold off elective procedures for 6-12 months after stenting. However, this is a bit more pressing then elective. Dr. Judithann Sheen doesn't hold Plavix prior to colonoscopy.  Would we be ok to do that colon at 6 months after stenting if he stays on Plavix?  Thanks! Randall Hiss, [facility]

## 2019-11-02 NOTE — Telephone Encounter (Signed)
See message below. Ok to proceed with TCS with RMR on propofol/MAC.  Discussed with RMR, ok to do on Plavix  ASA III/IV

## 2019-11-02 NOTE — Telephone Encounter (Signed)
Need Refill on diazepam (VALIUM) 5 MG tablet [place], [place] - [address]   Pt Call back [phone]

## 2019-11-02 NOTE — Telephone Encounter (Signed)
Ok to refill??  Last office visit/ refill 08/04/2019, #2 refills.

## 2019-11-03 MED ORDER — DIAZEPAM 5 MG PO TABS: 5.0000 mg | ORAL_TABLET | Freq: Two times a day (BID) | ORAL | 2 refills | Status: DC | PRN

## 2019-11-03 NOTE — Telephone Encounter (Signed)
Noted  

## 2019-11-09 ENCOUNTER — Encounter: Payer: Self-pay | Admitting: Nurse Practitioner

## 2019-11-16 NOTE — Telephone Encounter (Signed)
LMOVM for pt 

## 2019-11-20 NOTE — Telephone Encounter (Signed)
Tried to call niece [name]), no answer, LMOVM for return call.

## 2019-11-21 NOTE — Telephone Encounter (Signed)
Bill Johnson called office, TCS w/Prop w/RMR scheduled for 03/04/20 at 12:15pm. She is aware Bill Johnson can remain on Plavix. Orders entered.

## 2019-11-21 NOTE — Telephone Encounter (Signed)
Pre-op/covid test 02/29/20. Appt letter mailed with procedure instructions.

## 2019-12-01 ENCOUNTER — Encounter: Payer: Medicare Other | Admitting: Family Medicine

## 2019-12-01 ENCOUNTER — Other Ambulatory Visit: Payer: Self-pay

## 2019-12-01 ENCOUNTER — Encounter: Payer: Self-pay | Admitting: Family Medicine

## 2019-12-01 ENCOUNTER — Ambulatory Visit (INDEPENDENT_AMBULATORY_CARE_PROVIDER_SITE_OTHER): Payer: Medicare Other | Admitting: Family Medicine

## 2019-12-01 VITALS — BP 138/84 | HR 100 | Temp 98.1°F | Resp 14 | Ht 64.0 in | Wt 170.0 lb

## 2019-12-01 DIAGNOSIS — Z0000 Encounter for general adult medical examination without abnormal findings: Secondary | ICD-10-CM

## 2019-12-01 DIAGNOSIS — Z23 Encounter for immunization: Secondary | ICD-10-CM

## 2019-12-01 DIAGNOSIS — Z0001 Encounter for general adult medical examination with abnormal findings: Secondary | ICD-10-CM | POA: Diagnosis not present

## 2019-12-01 DIAGNOSIS — Z125 Encounter for screening for malignant neoplasm of prostate: Secondary | ICD-10-CM

## 2019-12-01 DIAGNOSIS — I10 Essential (primary) hypertension: Secondary | ICD-10-CM

## 2019-12-01 DIAGNOSIS — J439 Emphysema, unspecified: Secondary | ICD-10-CM | POA: Diagnosis not present

## 2019-12-01 DIAGNOSIS — R2241 Localized swelling, mass and lump, right lower limb: Secondary | ICD-10-CM | POA: Diagnosis not present

## 2019-12-01 DIAGNOSIS — I2510 Atherosclerotic heart disease of native coronary artery without angina pectoris: Secondary | ICD-10-CM

## 2019-12-01 MED ORDER — ATORVASTATIN CALCIUM 80 MG PO TABS
80.0000 mg | ORAL_TABLET | Freq: Every evening | ORAL | 2 refills | Status: DC
Start: 2019-12-01 — End: 2020-12-10

## 2019-12-01 NOTE — Progress Notes (Signed)
Subjective:   Patient presents for Medicare Annual/Subsequent preventive examination.   Patient here for wellness exam.  States that he has been feeling better since he had his stents put in.  He is not having chest pain or shortness of breath.  He is taking all his medicines as prescribed.  He has had some swelling and pain in his right great toe that has been present for almost a year.  States that it flares up and goes down.  He is using a topical anti-inflammatory that has helped.  He has not had any imaging but wanted me to be aware of it.  Due  for PSA screening he has family history of prostate cancer       Review Past Medical/Family/Social:per EMR   Risk Factors Current exercise habits:none Dietary issues discussed:Yes  Cardiac risk factors:HTN,  Depression Screen (Note: if answer to either of the following is "Yes", a more complete depression screening is indicated)  Over the past two weeks, have you felt down, depressed or hopeless? No Over the past two weeks, have you felt little interest or pleasure in doing things? No Have you lost interest or pleasure in daily life? No Do you often feel hopeless? No Do you cry easily over simple problems?No   Activities of Daily Living In your present state of health, do you have any difficulty performing the following activities?:  Driving? No  Managing money? No  Feeding yourself? No  Getting from bed to chair? No  Climbing a flight of stairs?Yes Preparing food and eating?: No  Bathing or showering? No  Getting dressed: No  Getting to the toilet? No  Using the toilet:No  Moving around from place to place: No  In the past year have you fallen or had a near fall?:no   Hearing Difficulties:Yes - has hearing aides  Do you often ask people to speak up or repeat themselves?Yes Do you experience ringing or noises in your ears?Yes Do you have difficulty understanding soft or whispered voices?Yes Do you  feel that you have a problem with memory?Yes Do you often misplace items?Yes Do you feel safe at home?Yes  Cognitive Testing Alert? Yes Normal Appearance?Yes  Oriented to person? Yes Place? Yes  Time? Yes  Recall of three objects? Yes  Can perform simple calculations? Yes  Displays appropriate judgment?Yes  Can read the correct time from a watch face?Yes   List the Names of Other Physician/Practitioners you currently use: [facility]- Dr. Cyndy Freeze   Screening Tests / Date Colonoscopydue for repeat in Feb  ZostavaxDeclines Tetanus/tdapUTF Pneumonia- Due HEP C- negative Flu- Due COVID-19 UTD  ROS:  GEN- denies fatigue, fever, weight loss,weakness, recent illness HEENT- denies eye drainage, change in vision, nasal discharge, CVS- denies chest pain, palpitations RESP- denies SOB, cough, wheeze ABD- denies N/V, change in stools, abd pain GU- denies dysuria, hematuria, dribbling, incontinence MSK-+joint pain, muscle aches, injury Neuro- denies headache, dizziness, syncope, seizure activity  Physical: Vitals reviewed  GEN- NAD, alert and oriented x3 HEENT- PERRL, EOMI, non injected sclera, pink conjunctiva, MMM, oropharynx clear,  Hearing aides  Neck- Supple, no thryomegaly, no bruit  CVS- RRR, no murmur RESP-CTAB ABD-NABS,soft,NT,ND EXT- No edema right foot swelling over base of great toe, Mild TTP, no erythema, fair ROM ankle, toe  Pulses- Radial 2+   Assessment:    Annual wellness medicare exam   Plan:    During the course of the visit the patient was educated and counseled about appropriate screening and  preventive services including:   Preventative medicine he agrees to get his pneumonia vaccine today.   COPD   controlled with his inhaler.   PSA to be done   Hyperlipidemia/CAD no symptoms, bp controlled, continue lipitor  Recent lipid at goal \  Hypertension blood pressure has been  controlled with his current regimen  GAD- continue valium, also helps with sleep   Foot pain concern for gout- obtain URIC Acid, he declines xray to evaluate for OA   Full code- brother would by default be POA, given advanced directives    Hearing loss he currently has hearing aids he is legally deaf  Audit c- has history of alcohol overuse, tries to control amount   No recent falls Diet review for nutrition referral? Yes ____ Not Indicated __x__  Patient Instructions (the written plan) was given to the patient.  Medicare Attestation  I have personally reviewed:  The patient's medical and social history  Their use of alcohol, tobacco or illicit drugs  Their current medications and supplements  The patient's functional ability including ADLs,fall risks, home safety risks, cognitive, and hearing and visual impairment  Diet and physical activities  Evidence for depression or mood disorders  The patient's weight, height, BMI, and visual acuity have been recorded in the chart. I have made referrals, counseling, and provided education to the patient based on review of the above and I have provided the patient with a written personalized care plan for preventive services.

## 2019-12-01 NOTE — Patient Instructions (Signed)
F/U 6 months

## 2019-12-01 NOTE — Addendum Note (Signed)
Addended by: Sheral Flow on: 12/01/2019 03:02 PM   Modules accepted: Orders

## 2019-12-02 LAB — COMPREHENSIVE METABOLIC PANEL
AG Ratio: 1.5 (calc) (ref 1.0–2.5)
ALT: 20 U/L (ref 9–46)
AST: 25 U/L (ref 10–35)
Albumin: 4.3 g/dL (ref 3.6–5.1)
Alkaline phosphatase (APISO): 81 U/L (ref 35–144)
BUN: 9 mg/dL (ref 7–25)
CO2: 25 mmol/L (ref 20–32)
Calcium: 9.4 mg/dL (ref 8.6–10.3)
Chloride: 106 mmol/L (ref 98–110)
Creat: 0.71 mg/dL (ref 0.70–1.25)
Globulin: 2.9 g/dL (calc) (ref 1.9–3.7)
Glucose, Bld: 78 mg/dL (ref 65–99)
Potassium: 4.1 mmol/L (ref 3.5–5.3)
Sodium: 140 mmol/L (ref 135–146)
Total Bilirubin: 0.7 mg/dL (ref 0.2–1.2)
Total Protein: 7.2 g/dL (ref 6.1–8.1)

## 2019-12-02 LAB — PSA: PSA: 0.55 ng/mL (ref <=4.0)

## 2019-12-02 LAB — CBC WITH DIFFERENTIAL/PLATELET
Absolute Monocytes: 789 cells/uL (ref 200–950)
Basophils Absolute: 58 cells/uL (ref 0–200)
Basophils Relative: 0.7 %
Eosinophils Absolute: 282 cells/uL (ref 15–500)
Eosinophils Relative: 3.4 %
HCT: 40.4 % (ref 38.5–50.0)
Hemoglobin: 13.4 g/dL (ref 13.2–17.1)
Lymphs Abs: 2556 cells/uL (ref 850–3900)
MCH: 32.8 pg (ref 27.0–33.0)
MCHC: 33.2 g/dL (ref 32.0–36.0)
MCV: 98.8 fL (ref 80.0–100.0)
MPV: 10.7 fL (ref 7.5–12.5)
Monocytes Relative: 9.5 %
Neutro Abs: 4615 cells/uL (ref 1500–7800)
Neutrophils Relative %: 55.6 %
Platelets: 226 10*3/uL (ref 140–400)
RBC: 4.09 10*6/uL — ABNORMAL LOW (ref 4.20–5.80)
RDW: 13.1 % (ref 11.0–15.0)
Total Lymphocyte: 30.8 %
WBC: 8.3 10*3/uL (ref 3.8–10.8)

## 2019-12-02 LAB — URIC ACID: Uric Acid, Serum: 6.3 mg/dL (ref 4.0–8.0)

## 2019-12-04 ENCOUNTER — Other Ambulatory Visit: Payer: Self-pay | Admitting: *Deleted

## 2019-12-04 MED ORDER — PREDNISONE 10 MG PO TABS: ORAL_TABLET | ORAL | 0 refills | Status: DC

## 2019-12-14 ENCOUNTER — Telehealth: Payer: Self-pay | Admitting: Family Medicine

## 2019-12-14 ENCOUNTER — Other Ambulatory Visit: Payer: Self-pay

## 2019-12-14 MED ORDER — ATENOLOL 50 MG PO TABS
50.0000 mg | ORAL_TABLET | Freq: Every day | ORAL | 0 refills | Status: DC
Start: 2019-12-14 — End: 2020-03-11

## 2019-12-14 NOTE — Telephone Encounter (Signed)
Refill Atenolol 50 mg

## 2019-12-25 ENCOUNTER — Telehealth: Payer: Self-pay | Admitting: Internal Medicine

## 2019-12-25 NOTE — Telephone Encounter (Signed)
Patient walked into office and wants to cancel procedure. He is keeping his upcoming OV with Randall Hiss on 12/16. Procedure cancelled. FYI to [employer].

## 2019-12-25 NOTE — Telephone Encounter (Signed)
PATIENT DOES NOT WANT COLONOSCOPY

## 2019-12-25 NOTE — Telephone Encounter (Signed)
Patient wanted to cancel his procedure because he sis not want to take the prep again, said that unless they had one that he could do in 24 hours he did not want to do it.

## 2019-12-27 NOTE — Telephone Encounter (Signed)
Noted  

## 2020-01-04 DIAGNOSIS — M5412 Radiculopathy, cervical region: Secondary | ICD-10-CM | POA: Diagnosis not present

## 2020-01-04 DIAGNOSIS — M12812 Other specific arthropathies, not elsewhere classified, left shoulder: Secondary | ICD-10-CM | POA: Diagnosis not present

## 2020-01-04 DIAGNOSIS — Z955 Presence of coronary angioplasty implant and graft: Secondary | ICD-10-CM

## 2020-01-04 DIAGNOSIS — M25512 Pain in left shoulder: Secondary | ICD-10-CM | POA: Diagnosis not present

## 2020-01-04 DIAGNOSIS — G8929 Other chronic pain: Secondary | ICD-10-CM | POA: Diagnosis not present

## 2020-01-04 DIAGNOSIS — M25511 Pain in right shoulder: Secondary | ICD-10-CM | POA: Diagnosis not present

## 2020-01-04 DIAGNOSIS — M12811 Other specific arthropathies, not elsewhere classified, right shoulder: Secondary | ICD-10-CM | POA: Diagnosis not present

## 2020-01-11 ENCOUNTER — Ambulatory Visit (INDEPENDENT_AMBULATORY_CARE_PROVIDER_SITE_OTHER): Payer: Medicare Other | Admitting: Nurse Practitioner

## 2020-01-11 ENCOUNTER — Encounter: Payer: Self-pay | Admitting: Nurse Practitioner

## 2020-01-11 ENCOUNTER — Other Ambulatory Visit: Payer: Self-pay

## 2020-01-11 VITALS — BP 110/72 | HR 71 | Temp 97.1°F | Ht 64.0 in | Wt 165.6 lb

## 2020-01-11 DIAGNOSIS — I2510 Atherosclerotic heart disease of native coronary artery without angina pectoris: Secondary | ICD-10-CM

## 2020-01-11 DIAGNOSIS — R197 Diarrhea, unspecified: Secondary | ICD-10-CM

## 2020-01-11 DIAGNOSIS — D126 Benign neoplasm of colon, unspecified: Secondary | ICD-10-CM | POA: Insufficient documentation

## 2020-01-11 NOTE — Progress Notes (Signed)
Referring Provider: Alycia Rossetti, MD Primary Care Physician:  Alycia Rossetti, MD Primary GI:  Dr. Gala Romney  Chief Complaint  Patient presents with  . loose stool    Stool starts soft, then ends loose    HPI:   Bill Johnson is a 64 y.o. male who presents for 96-monthfollow-up.  The patient was last seen in our office 10/26/2019 for tubulovillous adenoma, constipation, diarrhea.  Noted history of chronic GERD, hard of hearing.  Chronic GERD since teenage years.  Previous hospitalization and states at that time he was not taking care of himself.  Prior to his hospitalization he admitted to "power drinking" without eating.  Has tried to make improvement in his lifestyle choices and care for himself.  CT of the abdomen and pelvis 04/14/2019 with eccentric nodular focus of wall thickening in the right rectal wall recommended correlation with colonoscopy to exclude neoplasm.  Colonoscopy completed 05/01/2019 with grade 3 hemorrhoids, three 5 8 mm polyps in the sigmoid colon and splenic flexure, a single 12 mm polyp in ileocecal valve, and a 30 mm polyp in the distal rectum status post piecemeal resection, direct-current ablation, clip placement.  Recommended repeat colonoscopy in 6 months.  Surgical pathology found tubular adenoma of the smaller polyps and tubulovillous adenoma of the larger rectal polyp.  At his last visit noted Bristol 2 stools and after he passed a couple then he will have [place] 6 stools.  Generally has 5-8 bowel movements a day with rectal discomfort, but "nothing major."  No other overt GI complaints. Cardiac stent placed 07/05/2019 and 07/20/2019 and was started on DAPT for 12 months then consider Plavix monotherapy ongoing.  Recommended fiber supplement once a day for 2 weeks and increase to twice a day, discuss repeat colonoscopy with cardiology for their parameters given large tubulovillous adenoma, follow-up in 3 months.  Further recommendations to  follow.  Reconsult with cardiology and indicated if he can stay on Plavix for the procedure then no contraindications.  He was scheduled for 03/04/2020.  However, the patient came into our office 12/25/2019 to cancel his procedure stating he did not want to have it done because he did not want to do another bowel prep.  Today he states he is doing okay overall. In the morning he has a BS[employer] worker2 stool followed by [employer]4 then BM[place]omet6. He was taking fiber, but forgot and states "Oh yeah, I need to get those again." Denies abdominal pain, N/V, hematochezia, melena, fever, chills, unintentional weight loss. Denies URI or flu-like symptoms. Denies loss of sense of taste or smell. The patient has received COVID-19 vaccination(s). Denies chest pain, dyspnea, dizziness, lightheadedness, syncope, near syncope. Denies any other upper or lower GI symptoms.   He has GERD symptoms if he eats "the wrong thing, but I avoid those so I don't have a problem." he is on Protonix 40 mg bid.  Past Medical History:  Diagnosis Date  . Alcohol abuse   . Allergy    seasonal  . Anxiety   . CAD (coronary artery disease)    a. 06/2019 cath - s/p successful PCI of the ostial to mid LAD with IVUS guidance and DES x 2.  . Depression   . Essential hypertension   . GERD (gastroesophageal reflux disease)   . Hyperlipidemia   . Hypertension   . OA (osteoarthritis) of knee   . Pulmonary nodules    a. seen on coronary CT 05/2019, will need OP f/u.  .Marland Kitchen  Rotator cuff tear arthropathy of both shoulders    On DISABILITY, non operative case  . Shoulder pain   . Tobacco abuse   . Vertigo     Past Surgical History:  Procedure Laterality Date  . CARDIAC CATHETERIZATION  06/2005   no significant CAD  . COLONOSCOPY WITH PROPOFOL N/A 05/01/2019   Procedure: COLONOSCOPY WITH PROPOFOL;  Surgeon: Daneil Dolin, MD;  [facility];  Service: Endoscopy;  Laterality: N/A;  7:30am  . CORONARY STENT INTERVENTION N/A 07/05/2019    Procedure: CORONARY STENT INTERVENTION;  Surgeon: Martinique, Peter M, MD;  Location: [place] [facility];  Service: Cardiovascular;  Laterality: N/A;  . CORONARY STENT INTERVENTION N/A 07/20/2019   Procedure: CORONARY STENT INTERVENTION;  Surgeon: Jettie Booze, MD;  Location: [place] [facility];  Service: Cardiovascular;  Laterality: N/A;  . ESOPHAGOGASTRODUODENOSCOPY  08/2005   Dr. Henrene Pastor: small hiatal hernia  . HERNIA REPAIR  1983  . INTRAVASCULAR PRESSURE WIRE/FFR STUDY N/A 07/05/2019   Procedure: INTRAVASCULAR PRESSURE WIRE/FFR STUDY;  Surgeon: Martinique, Peter M, MD;  Location: [place] [facility];  Service: Cardiovascular;  Laterality: N/A;  . INTRAVASCULAR ULTRASOUND/IVUS N/A 07/05/2019   Procedure: Intravascular Ultrasound/IVUS;  Surgeon: Martinique, Peter M, MD;  Location: [place] [facility];  Service: Cardiovascular;  Laterality: N/A;  . LEFT HEART CATH AND CORONARY ANGIOGRAPHY N/A 07/05/2019   Procedure: LEFT HEART CATH AND CORONARY ANGIOGRAPHY;  Surgeon: Martinique, Peter M, MD;  Location: [place] [facility];  Service: Cardiovascular;  Laterality: N/A;  . LEFT HEART CATH AND CORONARY ANGIOGRAPHY N/A 07/20/2019   Procedure: LEFT HEART CATH AND CORONARY ANGIOGRAPHY;  Surgeon: Jettie Booze, MD;  Location: [place] [facility];  Service: Cardiovascular;  Laterality: N/A;  . POLYPECTOMY  05/01/2019   Procedure: POLYPECTOMY;  Surgeon: Daneil Dolin, MD;  [facility];  Service: Endoscopy;;  . ROTATOR CUFF REPAIR Bilateral   . TONSILLECTOMY  1977    Current Outpatient Medications  Medication Sig Dispense Refill  . acetaminophen (TYLENOL) 500 MG tablet Take 500 mg by mouth as needed for mild pain or headache.     . albuterol (PROVENTIL HFA;VENTOLIN HFA) 108 (90 Base) MCG/ACT inhaler Inhale 2 puffs into the lungs every 6 (six) hours as needed for wheezing or shortness of breath. 1 Inhaler 2  . amLODipine (NORVASC) 5 MG tablet Take 1 tablet (5 mg total) by mouth daily. 90 tablet 3  .  aspirin 81 MG tablet Take 81 mg by mouth daily.    Marland Kitchen atenolol (TENORMIN) 50 MG tablet Take 1 tablet (50 mg total) by mouth daily. 90 tablet 0  . atorvastatin (LIPITOR) 80 MG tablet Take 1 tablet (80 mg total) by mouth every evening. 90 tablet 2  . clopidogrel (PLAVIX) 75 MG tablet Take 1 tablet (75 mg total) by mouth daily. 90 tablet 2  . CVS VITAMIN B-12 2000 MCG TBCR Take 2,000 mcg by mouth daily.    . diazepam (VALIUM) 5 MG tablet Take 1 tablet (5 mg total) by mouth every 12 (twelve) hours as needed for anxiety. 45 tablet 2  . isosorbide mononitrate (IMDUR) 30 MG 24 hr tablet Take 1 tablet (30 mg total) by mouth daily. 90 tablet 1  . meclizine (ANTIVERT) 25 MG tablet TAKE 1 TABLET BY MOUTH THREE TIMES DAILY AS NEEDED FOR DIZZINESS 90 tablet 2  . nitroGLYCERIN (NITROSTAT) 0.4 MG SL tablet Place 1 tablet (0.4 mg total) under the tongue every 5 (five) minutes as needed  for chest pain (up to 3 doses). 25 tablet 3  . pantoprazole (PROTONIX) 40 MG tablet Take 1 tablet (40 mg total) by mouth 2 (two) times daily. 180 tablet 2  . umeclidinium-vilanterol (ANORO ELLIPTA) 62.5-25 MCG/INH AEPB Inhale 1 puff into the lungs daily. 1 each 11   No current facility-administered medications for this visit.    Allergies as of 01/11/2020  . (No Known Allergies)    Family History  Problem Relation Age of Onset  . Heart disease Mother   . Diabetes Mother   . Hypertension Mother   . Heart disease Father   . Hypertension Father   . Stroke Other   . Cancer Other   . Cancer Brother        prostate  . Colon cancer Neg Hx     Social History   Socioeconomic History  . Marital status: Single    Spouse name: Not on file  . Number of children: 2  . Years of education: 55  . Highest education level: Not on file  Occupational History  . Occupation: Film/video editor Designer, television/film set)  Tobacco Use  . Smoking status: Current Every Day Smoker    Packs/day: 1.00    Years: 43.00    Pack years: 43.00    Types:  Cigarettes    Start date: 04/15/1970    Last attempt to quit: 07/04/2019    Years since quitting: 0.5  . Smokeless tobacco: Never Used  . Tobacco comment: 1 pack a week  Vaping Use  . Vaping Use: Never used  Substance and Sexual Activity  . Alcohol use: Yes    Comment: Drinks 3-4 beers/day   . Drug use: No  . Sexual activity: Yes  Other Topics Concern  . Not on file  Social History Narrative   Lives at home with nephew and niece in-law   Caffeine use: Drinks coffee/soda every day   Social Determinants of Health   Financial Resource Strain: Not on file  Food Insecurity: Not on file  Transportation Needs: Not on file  Physical Activity: Not on file  Stress: Not on file  Social Connections: Not on file    Subjective: Review of Systems  Constitutional: Negative for chills, fever, malaise/fatigue and weight loss.  HENT: Negative for congestion and sore throat.   Respiratory: Negative for cough and shortness of breath.   Cardiovascular: Negative for chest pain and palpitations.  Gastrointestinal: Positive for diarrhea. Negative for abdominal pain, blood in stool, heartburn, melena, nausea and vomiting.  Musculoskeletal: Negative for joint pain and myalgias.  Skin: Negative for rash.  Neurological: Negative for dizziness and weakness.  Endo/Heme/Allergies: Does not bruise/bleed easily.  Psychiatric/Behavioral: Negative for depression. The patient is not nervous/anxious.   All other systems reviewed and are negative.    Objective: BP 110/72   Pulse 71   Temp (!) 97.1 F (36.2 C) (Temporal)   Ht _0  (1.626 m)   Wt 165 lb 9.6 oz (75.1 kg)   BMI 28.43 kg/m  Physical Exam Vitals and nursing note reviewed.  Constitutional:      General: He is not in acute distress.    Appearance: Normal appearance. He is overweight. He is not ill-appearing, toxic-appearing or diaphoretic.  HENT:     Head: Normocephalic and atraumatic.     Nose: No congestion or rhinorrhea.  Eyes:      General: No scleral icterus. Cardiovascular:     Rate and Rhythm: Normal rate and regular rhythm.     Heart sounds:  Normal heart sounds.  Pulmonary:     Effort: Pulmonary effort is normal.     Breath sounds: Normal breath sounds.  Abdominal:     General: Bowel sounds are normal. There is no distension.     Palpations: Abdomen is soft. There is no hepatomegaly, splenomegaly or mass.     Tenderness: There is no abdominal tenderness. There is no guarding or rebound.     Hernia: No hernia is present.  Musculoskeletal:     Cervical back: Neck supple.  Skin:    General: Skin is warm and dry.     Coloration: Skin is not jaundiced.     Findings: No bruising or rash.  Neurological:     General: No focal deficit present.     Mental Status: He is alert and oriented to person, place, and time. Mental status is at baseline.  Psychiatric:        Mood and Affect: Mood normal.        Behavior: Behavior normal.        Thought Content: Thought content normal.      Assessment:  Very pleasant 64 year old male presents for follow-up on recently completed colonoscopy as well as diarrhea.  Overall he is doing well.  Diarrhea is stable but persistent.  Typically starts with a Bristol 2 stool in the morning that progressed into [place] 4 then [place] 6.  Likely overflow soft stools.  Later in the day he does not have loose stools.  Please recommend a trial of fiber but he has not been taking it.  Diarrhea: He does not seem to be having true diarrhea.  Seems like he has a little bit of a hard stool that went eventually passes he will have a normal and and softer stool.  Some loose stools are likely ongoing.  Possible overflow.  Once he clears his bowels he does not have issues later in the day.  Previously recommended fiber but he states he only took this briefly and forgot about it.  He states he will get the over-the-counter medication again and trial this.  Overall his diarrhea is not severe and  significantly concerning to him.  Recent colonoscopy reassuring.  Need for repeat colonoscopy: His recent colonoscopy he had 4 polyps 1 of which was a 30 mm tubulovillous adenoma that was removed from the rectum in piecemeal fashion.  Recommended a repeat colonoscopy in 6 months to ensure all of the polyp had been removed.  However, before that could be done he ended up with a cardiac stent x2 and remains on Plavix.  We contacted cardiology who is okay with proceeding if we can keep him on Plavix given the more urgent nature.  He canceled his scheduled colonoscopy due to not wanting to complete another bowel prep.  We had a frank discussion today and he has agreed to proceed  Proceed with colonoscopy on propofol/MAC by Dr. Gala Romney in near future: the risks, benefits, and alternatives have been discussed with the patient in detail. The patient states understanding and desires to proceed.  The patient is currently on Plavix, Valium. The patient is not on any other anticoagulants, anxiolytics, chronic pain medications, antidepressants, antidiabetics, or iron supplements.  We will plan for the procedure on propofol/MAC tomorrow adequate sedation.  ASA III/IV   Plan: 1. Colonoscopy 2. Fiber supplement once a day for 2 weeks and can increase to twice a day 3. Continue Plavix 4. Continue other medications 5. Return for follow-up visit post procedure recommendations are  as needed.    Thank you for allowing [place] to participate in the care of [place], DNP, AGNP-C Adult & Gerontological Nurse Practitioner [facility]   01/11/2020 4:02 PM   Disclaimer: This note was dictated with voice recognition software. Similar sounding words can inadvertently be transcribed and may not be corrected upon review.

## 2020-01-11 NOTE — Patient Instructions (Signed)
Your health issues we discussed today were:   Need for repeat colonoscopy: 1. We will schedule your repeat colonoscopy for you 2. If you develop vertigo when doing your colon prep, use Meclizine and Valium as recommended to help 3. Further recommendations will follow your colonoscopy  Diarrhea: 1. Try the colon pills ("Galisteo") to see if that helps 2. You can also try fiber once a day (such as Benefiber, Benefiber chews, store brand fiber supplements) 3. Call us if you have any worsening or severe symptoms  Overall I recommend:  1. Call us if you have any questions or concerns 2. Follow-up based on recommendations made after your colonoscopy or if you need [place] for GI problems 3. Continue your other medications   ---------------------------------------------------------------  I am glad you have gotten your COVID-19 vaccination!  Even though you are fully vaccinated you should continue to follow CDC and state/local guidelines.  ---------------------------------------------------------------   At [facility] we value your feedback. You may receive a survey about your visit today. Please share your experience as we strive to create trusting relationships with our patients to provide genuine, compassionate, quality care.  We appreciate your understanding and patience as we review any laboratory studies, imaging, and other diagnostic tests that are ordered as we care for you. Our office policy is 5 business days for review of these results, and any emergent or urgent results are addressed in a timely manner for your best interest. If you do not hear from our office in 1 week, please contact us.   We also encourage the use of MyChart, which contains your medical information for your review as well. If you are not enrolled in this feature, an access code is on this after visit summary for your convenience. Thank you for allowing [place] to be involved in your  care.  It was great to see you today!  I hope you have a Merry Christmas and Happy Holidays!!

## 2020-01-12 NOTE — Progress Notes (Signed)
Cc'ed to pcp °

## 2020-01-23 NOTE — Progress Notes (Signed)
Cardiology Office Note  Date: 01/24/2020   ID: Bill Johnson, DOB [birth year], MRN [id number]  PCP:  Alycia Rossetti, MD  Cardiologist:  Kate Sable, MD (Inactive) Electrophysiologist:  None   Chief Complaint: F/U CAD status post cardiac catheterization 07/24/2019.  History of Present Illness: Bill Johnson is a 64 y.o. male with a history of chest pain, smoking, GERD, HLD, alcohol abuse.  Last encounter with me on 06/29/2019 he admitted experiencing exertional fatigue with intermittent left-sided chest pains occurring when up walking up hill,  using a chainsaw, or other exertional activities. Had a recent Cardiac CT demonstrating a possible flow limiting lesion in mLAD and prx. superior branch of OM. A cardiac catheterization was scheduled for June 9 with Dr Martinique. Cardiac cath 07/05/2019 with DES x 2 to the ostial to mid LAD.  He presented to [facility] ED 07/19/2019 with recurrent chest pain on and off  onset 4 days prior to presentation. On Sunday prior to presentation had one episode of stabbing left chest pain which quickly resolved. On 07/19/2019 around 1100 he had a similar pain and had relief with NTG. Multiple episodes approximately 3 hours later and took another NTG. While in ED patient had another episode of chest pain which resolved with NTG. Initially pain thought possibly d/t jailed diagonal from previous stenting to LAD.   He was transferred to [facility] 07/20/2019 with repeat catheterization with DES to OM. Has was to be observed overnight and and discharged to home on 07/21/2019.  Last visit he was here for post cardiac catheterization follow-up.  Stated he still having some occasional sharp chest pains which he described as not necessarily associated with activity, but.  Happen during activity such as mowing his lawn.  Otherwise he denies any progressive exertional symptoms.  States he did stop smoking and stop drinking.  He denied any PND, orthopnea, neck, arm, back, jaw pain  on exertion.  Denied any nausea, vomiting or diaphoresis on exertion.  No CVA or TIA-like symptoms, claudication-like symptoms, DVT or PE-like symptoms, or lower extremity edema.   He is here for 34-month follow-up.  Other than minor fleeting episodes of chest pain which are unrelated to activity he denies any other issues.  He states he had stopped smoking for a while but his father died and he picked up smoking again.  He states he is down to 1 pack every 5 days.  He plans to quit soon.  He denies any exertional angina when he mows his yard or does strenuous yard work.  Denies any significant exertional dyspnea when performing these duties.  He denies any palpitations or arrhythmias, CVA or TIA-like symptoms, PND, orthopnea, orthostatic symptoms, bleeding issues, claudication-like symptoms, DVT or PE-like symptoms, or lower extremity edema.   Past Medical History:  Diagnosis Date  . Alcohol abuse   . Allergy    seasonal  . Anxiety   . CAD (coronary artery disease)    a. 06/2019 cath - s/p successful PCI of the ostial to mid LAD with IVUS guidance and DES x 2.  . Depression   . Essential hypertension   . GERD (gastroesophageal reflux disease)   . Hyperlipidemia   . Hypertension   . OA (osteoarthritis) of knee   . Pulmonary nodules    a. seen on coronary CT 05/2019, will need OP f/u.  Marland Kitchen Rotator cuff tear arthropathy of both shoulders    On DISABILITY, non operative case  . Shoulder pain   . Tobacco abuse   .  Vertigo     Past Surgical History:  Procedure Laterality Date  . CARDIAC CATHETERIZATION  06/2005   no significant CAD  . COLONOSCOPY WITH PROPOFOL N/A 05/01/2019   Procedure: COLONOSCOPY WITH PROPOFOL;  Surgeon: Corbin Ade, MD;  Location: [facility];  Service: Endoscopy;  Laterality: N/A;  7:30am  . CORONARY STENT INTERVENTION N/A 07/05/2019   Procedure: CORONARY STENT INTERVENTION;  Surgeon: Swaziland, Peter M, MD;  Location: [facility] INVASIVE CV LAB;  Service: Cardiovascular;   Laterality: N/A;  . CORONARY STENT INTERVENTION N/A 07/20/2019   Procedure: CORONARY STENT INTERVENTION;  Surgeon: Corky Crafts, MD;  Location: [facility] INVASIVE CV LAB;  Service: Cardiovascular;  Laterality: N/A;  . ESOPHAGOGASTRODUODENOSCOPY  08/2005   Dr. Marina Goodell: small hiatal hernia  . HERNIA REPAIR  1983  . INTRAVASCULAR PRESSURE WIRE/FFR STUDY N/A 07/05/2019   Procedure: INTRAVASCULAR PRESSURE WIRE/FFR STUDY;  Surgeon: Swaziland, Peter M, MD;  Location: [facility] INVASIVE CV LAB;  Service: Cardiovascular;  Laterality: N/A;  . INTRAVASCULAR ULTRASOUND/IVUS N/A 07/05/2019   Procedure: Intravascular Ultrasound/IVUS;  Surgeon: Swaziland, Peter M, MD;  Location: [facility] INVASIVE CV LAB;  Service: Cardiovascular;  Laterality: N/A;  . LEFT HEART CATH AND CORONARY ANGIOGRAPHY N/A 07/05/2019   Procedure: LEFT HEART CATH AND CORONARY ANGIOGRAPHY;  Surgeon: Swaziland, Peter M, MD;  Location: [facility] INVASIVE CV LAB;  Service: Cardiovascular;  Laterality: N/A;  . LEFT HEART CATH AND CORONARY ANGIOGRAPHY N/A 07/20/2019   Procedure: LEFT HEART CATH AND CORONARY ANGIOGRAPHY;  Surgeon: Corky Crafts, MD;  Location: [facility] INVASIVE CV LAB;  Service: Cardiovascular;  Laterality: N/A;  . POLYPECTOMY  05/01/2019   Procedure: POLYPECTOMY;  Surgeon: Corbin Ade, MD;  Location: [facility];  Service: Endoscopy;;  . ROTATOR CUFF REPAIR Bilateral   . TONSILLECTOMY  1977    Current Outpatient Medications  Medication Sig Dispense Refill  . acetaminophen (TYLENOL) 500 MG tablet Take 500 mg by mouth as needed for mild pain or headache.     . albuterol (PROVENTIL HFA;VENTOLIN HFA) 108 (90 Base) MCG/ACT inhaler Inhale 2 puffs into the lungs every 6 (six) hours as needed for wheezing or shortness of breath. 1 Inhaler 2  . amLODipine (NORVASC) 5 MG tablet Take 1 tablet (5 mg total) by mouth daily. 90 tablet 3  . aspirin 81 MG tablet Take 81 mg by mouth daily.    Marland Kitchen atenolol (TENORMIN) 50 MG tablet Take 1 tablet (50 mg total) by mouth daily. 90  tablet 0  . atorvastatin (LIPITOR) 80 MG tablet Take 1 tablet (80 mg total) by mouth every evening. 90 tablet 2  . clopidogrel (PLAVIX) 75 MG tablet Take 1 tablet (75 mg total) by mouth daily. 90 tablet 2  . CVS VITAMIN B-12 2000 MCG TBCR Take 2,000 mcg by mouth daily.    . diazepam (VALIUM) 5 MG tablet Take 1 tablet (5 mg total) by mouth every 12 (twelve) hours as needed for anxiety. 45 tablet 2  . isosorbide mononitrate (IMDUR) 30 MG 24 hr tablet Take 1 tablet (30 mg total) by mouth daily. 90 tablet 1  . meclizine (ANTIVERT) 25 MG tablet TAKE 1 TABLET BY MOUTH THREE TIMES DAILY AS NEEDED FOR DIZZINESS 90 tablet 2  . nitroGLYCERIN (NITROSTAT) 0.4 MG SL tablet Place 1 tablet (0.4 mg total) under the tongue every 5 (five) minutes as needed for chest pain (up to 3 doses). 25 tablet 3  . pantoprazole (PROTONIX) 40 MG tablet Take 1 tablet (40 mg total) by mouth 2 (two)  times daily. 180 tablet 2  . umeclidinium-vilanterol (ANORO ELLIPTA) 62.5-25 MCG/INH AEPB Inhale 1 puff into the lungs daily. 1 each 11   No current facility-administered medications for this visit.   Allergies:  Patient has no known allergies.   Social History: The patient  reports that he has been smoking cigarettes. He started smoking about 49 years ago. He has a 43.00 pack-year smoking history. He has never used smokeless tobacco. He reports current alcohol use. He reports that he does not use drugs.   Family History: The patient's family history includes Cancer in his brother and another family member; Diabetes in his mother; Heart disease in his father and mother; Hypertension in his father and mother; Stroke in an other family member.   ROS:  Please see the history of present illness. Otherwise, complete review of systems is positive for none.  All other systems are reviewed and negative.   Physical Exam: VS:  BP 128/64   Pulse 72   Resp 18   Ht 5\' 4"  (1.626 m)   Wt 166 lb 3.2 oz (75.4 kg)   SpO2 99%   BMI 28.53 kg/m ,  BMI Body mass index is 28.53 kg/m.  Wt Readings from Last 3 Encounters:  01/24/20 166 lb 3.2 oz (75.4 kg)  01/11/20 165 lb 9.6 oz (75.1 kg)  12/01/19 170 lb (77.1 kg)    General: Patient appears comfortable at rest. Neck: Supple, no elevated JVP or carotid bruits, no thyromegaly. Lungs: Clear to auscultation, nonlabored breathing at rest. Cardiac: Regular rate and rhythm, no S3 or significant systolic murmur, no pericardial rub. Extremities: No pitting edema, distal pulses 2+. Skin: Warm and dry.   Musculoskeletal: No kyphosis. Neuropsychiatric: Alert and oriented x3, affect grossly appropriate.  ECG:  EKG 07/20/2019 sinus bradycardia rate of 55 bpm  Recent Labwork: 12/01/2019: ALT 20; AST 25; BUN 9; Creat 0.71; Hemoglobin 13.4; Platelets 226; Potassium 4.1; Sodium 140     Component Value Date/Time   CHOL 149 09/01/2019 0859   TRIG 104 09/01/2019 0859   HDL 59 09/01/2019 0859   CHOLHDL 2.5 09/01/2019 0859   VLDL 39 03/19/2019 0834   LDLCALC 71 09/01/2019 0859    Other Studies Reviewed Today:   Echocardiogram: 02/2019 IMPRESSIONS  1. Left ventricular ejection fraction, by estimation, is 55 to 60%. The  left ventricle has normal function. The left ventricle has no regional  wall motion abnormalities. Left ventricular diastolic parameters were  normal.  2. Right ventricular systolic function is normal. The right ventricular  size is normal. Tricuspid regurgitation signal is inadequate for assessing  PA pressure.  3. Left atrial size was upper normal.  4. Right atrial size was upper normal.  5. The mitral valve is grossly normal. Trivial mitral valve  regurgitation.  6. The aortic valve is tricuspid. Aortic valve regurgitation is mild.  7. The inferior vena cava is normal in size with greater than 50%  respiratory variability, suggesting right atrial pressure of 3 mmHg  Cardiac catheterization 07/05/2019   Prox LAD to Mid LAD lesion is 70% stenosed.  A  drug-eluting stent was successfully placed using a STENT RESOLUTE ONYX 3.0X22.  Post intervention, there is a 0% residual stenosis.  Mid LAD lesion is 50% stenosed.  A drug-eluting stent was successfully placed using a STENT RESOLUTE ONYX 2.5X30.  Post intervention, there is a 0% residual stenosis.  1st Mrg lesion is 95% stenosed.  The left ventricular systolic function is normal.  LV end diastolic pressure is  normal.  The left ventricular ejection fraction is 55-65% by visual estimate.   1. Severe 2 vessel obstructive CAD. Abnormal FFR of the LAD. 2. Normal LV function 3. Normal LVEDP 4. Successful PCI of the ostial to mid LAD with IVUS guidance and DES x 2.   Plan: DAPT for one year. Optimize medical therapy. Smoking cessation. If patient continues to have symptoms despite optimal therapy could consider PCI of the OM but this is a small branch and supplies a relatively small area of myocardium.  Diagnostic Dominance: Right   Intervention      Cardiac catheterization 07/20/2019   Previously placed Mid LAD drug eluting stent is widely patent.  Previously placed Prox LAD to Mid LAD drug eluting stent is widely patent.  1st Mrg lesion is 95% stenosed.  A drug-eluting stent was successfully placed using a STENT RESOLUTE ONYX 2.25X26, postdilated to 2.6 mm.  Post intervention, there is a 0% residual stenosis.  The left ventricular systolic function is normal.  LV end diastolic pressure is normal.  The left ventricular ejection fraction is 55-65% by visual estimate.  There is no aortic valve stenosis.   Continue aggressive secondary prevention.  Consider clopidogrel monotherapy after 12 months.   Watch overnight.  Home tomorrow.   He is interested in getting the COVID-19 vaccine.  I encouraged him to get the vaccines when possible.  Diagnostic Dominance: Right  Intervention        Assessment and Plan:   1. CAD in native artery Status post DES  to proximal to mid LAD.  DES to first marginal lesion. Has occasional fleeting transient chest pain not related to activity.  Otherwise no exertional angina or dyspnea.  Continue Imdur 30 mg daily to see if this may help alleviate the pain.  Patient states he has severe gastric reflux disease and is scheduled for an EGD soon.  He was wondering if there may be something in his abdomen which may be causing the chest pain.  Continue aspirin 81 mg daily, Plavix 75 mg daily, nitroglycerin sublingual as needed for chest pain.  2. Essential hypertension Blood pressure is well controlled on current therapy.  Blood pressure today 128/64.  Heart rate of 72.  Continue amlodipine 5 mg daily, atenolol 50 mg daily  3. Mixed hyperlipidemia Continue atorvastatin 80 mg daily.  Last lipid panel September 01, 2019 total cholesterol 149, HDL 59, triglycerides 104, LDL 71.  4. Tobacco abuse Previously stated he stopped smoking and drinking alcohol on June 8.  States he recently started back smoking after the death of his father but has cut way back on his smoking.  States a pack and lasting 5 days.  States he plans to quit soon.  Encouraged him to stop.   Medication Adjustments/Labs and Tests Ordered: Current medicines are reviewed at length with the patient today.  Concerns regarding medicines are outlined above.   Disposition: Follow-up with Dr.McDowell or APP 6 months  Signed, Rennis Harding, NP 01/24/2020 9:15 AM    [facility] at [facility] [address] [place], [place], [place] [address] Phone: [phone]; Fax: [phone]

## 2020-01-24 ENCOUNTER — Encounter: Payer: Self-pay | Admitting: Family Medicine

## 2020-01-24 ENCOUNTER — Other Ambulatory Visit: Payer: Self-pay

## 2020-01-24 ENCOUNTER — Ambulatory Visit (INDEPENDENT_AMBULATORY_CARE_PROVIDER_SITE_OTHER): Payer: Medicare Other | Admitting: Family Medicine

## 2020-01-24 VITALS — BP 128/64 | HR 72 | Resp 18 | Ht 64.0 in | Wt 166.2 lb

## 2020-01-24 DIAGNOSIS — I10 Essential (primary) hypertension: Secondary | ICD-10-CM | POA: Diagnosis not present

## 2020-01-24 DIAGNOSIS — E782 Mixed hyperlipidemia: Secondary | ICD-10-CM | POA: Diagnosis not present

## 2020-01-24 DIAGNOSIS — Z720 Tobacco use: Secondary | ICD-10-CM

## 2020-01-24 DIAGNOSIS — I2510 Atherosclerotic heart disease of native coronary artery without angina pectoris: Secondary | ICD-10-CM

## 2020-01-24 MED ORDER — ISOSORBIDE MONONITRATE ER 30 MG PO TB24: 30.0000 mg | ORAL_TABLET | Freq: Every day | ORAL | 3 refills | Status: DC

## 2020-01-24 MED ORDER — ISOSORBIDE MONONITRATE ER 30 MG PO TB24: 30.0000 mg | ORAL_TABLET | Freq: Every day | ORAL | 1 refills | Status: DC

## 2020-01-24 NOTE — Patient Instructions (Signed)
Medication Instructions:  Your physician recommends that you continue on your current medications as directed. Please refer to the Current Medication list given to you today.  *If you need a refill on your cardiac medications before your next appointment, please call your pharmacy*   Lab Work: None today If you have labs (blood work) drawn today and your tests are completely normal, you will receive your results only by: Marland Kitchen MyChart Message (if you have MyChart) OR . A paper copy in the mail If you have any lab test that is abnormal or we need to change your treatment, we will call you to review the results.   Testing/Procedures: None today   Follow-Up: At [facility], you and your health needs are our priority.  As part of our continuing mission to provide you with exceptional heart care, we have created designated Provider Care Teams.  These Care Teams include your primary Cardiologist (physician) and Advanced Practice Providers (APPs -  Physician Assistants and Nurse Practitioners) who all work together to provide you with the care you need, when you need it.  We recommend signing up for the patient portal called "MyChart".  Sign up information is provided on this After Visit Summary.  MyChart is used to connect with patients for Virtual Visits (Telemedicine).  Patients are able to view lab/test results, encounter notes, upcoming appointments, etc.  Non-urgent messages can be sent to your provider as well.   To learn more about what you can do with MyChart, go to [email or web address].    Your next appointment:   6 month(s)  The format for your next appointment:   In Person  Provider:      Nena Polio, NP or new Cardiologist   Other Instructions None    Thank you for choosing [facility] !

## 2020-01-25 ENCOUNTER — Ambulatory Visit: Payer: Medicare Other | Admitting: Nurse Practitioner

## 2020-01-29 DIAGNOSIS — M47892 Other spondylosis, cervical region: Secondary | ICD-10-CM | POA: Diagnosis not present

## 2020-02-01 DIAGNOSIS — H905 Unspecified sensorineural hearing loss: Secondary | ICD-10-CM | POA: Diagnosis not present

## 2020-02-15 ENCOUNTER — Other Ambulatory Visit: Payer: Self-pay | Admitting: Family Medicine

## 2020-02-15 DIAGNOSIS — M47812 Spondylosis without myelopathy or radiculopathy, cervical region: Secondary | ICD-10-CM | POA: Diagnosis not present

## 2020-02-15 MED ORDER — PANTOPRAZOLE SODIUM 40 MG PO TBEC
40.0000 mg | DELAYED_RELEASE_TABLET | Freq: Two times a day (BID) | ORAL | 2 refills | Status: DC
Start: 2020-02-15 — End: 2020-12-10

## 2020-02-15 MED ORDER — MECLIZINE HCL 25 MG PO TABS
ORAL_TABLET | ORAL | 2 refills | Status: DC
Start: 2020-02-15 — End: 2020-11-11

## 2020-02-15 MED ORDER — DIAZEPAM 5 MG PO TABS: 5.0000 mg | ORAL_TABLET | Freq: Two times a day (BID) | ORAL | 2 refills | Status: DC | PRN

## 2020-02-15 NOTE — Telephone Encounter (Signed)
Routine medications sent to pharmacy.   Ok to refill Diazepam??  Last office visit 12/01/2019.  Last refill 11/03/2019, #2 refills.

## 2020-02-15 NOTE — Telephone Encounter (Signed)
Patient stopped by requesting a refill on his diazepam (VALIUM) 5 MG tablet  pantoprazole (PROTONIX) 40 MG tablet meclizine (ANTIVERT) 25 MG tablet  [facility] in [place]  CB# [phone] ,

## 2020-02-29 ENCOUNTER — Other Ambulatory Visit (HOSPITAL_COMMUNITY): Payer: Medicare Other

## 2020-03-04 ENCOUNTER — Ambulatory Visit (HOSPITAL_COMMUNITY): Admit: 2020-03-04 | Payer: Medicare Other | Admitting: Internal Medicine

## 2020-03-04 ENCOUNTER — Encounter (HOSPITAL_COMMUNITY): Payer: Self-pay

## 2020-03-04 SURGERY — COLONOSCOPY WITH PROPOFOL
Anesthesia: Monitor Anesthesia Care

## 2020-03-04 NOTE — Patient Instructions (Addendum)
Your procedure is scheduled on: 03/07/2020  Report to [facility]  at 7:00    AM.  Call this number if you have problems the morning of surgery: [phone]   Remember:              Follow Directions on the letter you received from Your Physician's office regarding the Bowel Prep              No Smoking the day of Procedure :   Take these medicines the morning of surgery with A SIP OF WATER: Amlodipine, Atenolol and Pantoprazole  Use inhalers if needed   Do not wear jewelry, make-up or nail polish.    Do not bring valuables to the hospital.  Contacts, dentures or bridgework may not be worn into surgery.  .   Patients discharged the day of surgery will not be allowed to drive home.     Colonoscopy, Adult, Care After This sheet gives you information about how to care for yourself after your procedure. Your health care provider may also give you more specific instructions. If you have problems or questions, contact your health care provider. What can I expect after the procedure? After the procedure, it is common to have:  A small amount of blood in your stool for 24 hours after the procedure.  Some gas.  Mild abdominal cramping or bloating.  Follow these instructions at home: General instructions   For the first 24 hours after the procedure: ? Do not drive or use machinery. ? Do not sign important documents. ? Do not drink alcohol. ? Do your regular daily activities at a slower pace than normal. ? Eat soft, easy-to-digest foods. ? Rest often.  Take over-the-counter or prescription medicines only as told by your health care provider.  It is up to you to get the results of your procedure. Ask your health care provider, or the department performing the procedure, when your results will be ready. Relieving cramping and bloating  Try walking around when you have cramps or feel bloated.  Apply heat to your abdomen as told by your health care provider. Use a  heat source that your health care provider recommends, such as a moist heat pack or a heating pad. ? Place a towel between your skin and the heat source. ? Leave the heat on for 20-30 minutes. ? Remove the heat if your skin turns bright red. This is especially important if you are unable to feel pain, heat, or cold. You may have a greater risk of getting burned. Eating and drinking  Drink enough fluid to keep your urine clear or pale yellow.  Resume your normal diet as instructed by your health care provider. Avoid heavy or fried foods that are hard to digest.  Avoid drinking alcohol for as long as instructed by your health care provider. Contact a health care provider if:  You have blood in your stool 2-3 days after the procedure. Get help right away if:  You have more than a small spotting of blood in your stool.  You pass large blood clots in your stool.  Your abdomen is swollen.  You have nausea or vomiting.  You have a fever.  You have increasing abdominal pain that is not relieved with medicine. This information is not intended to replace advice given to you by your health care provider. Make sure you discuss any questions you have with your health care provider. Document Released: 08/27/2003 Document Revised: 10/07/2015 Document  Reviewed: 03/26/2015 Elsevier Interactive Patient Education  [employer].

## 2020-03-05 ENCOUNTER — Other Ambulatory Visit: Payer: Self-pay

## 2020-03-05 ENCOUNTER — Encounter (HOSPITAL_COMMUNITY): Payer: Self-pay

## 2020-03-05 ENCOUNTER — Other Ambulatory Visit (HOSPITAL_COMMUNITY)
Admission: RE | Admit: 2020-03-05 | Discharge: 2020-03-05 | Disposition: A | Discharge status: Home / Self Care | Payer: Medicare Other | Source: Ambulatory Visit | Attending: Internal Medicine | Admitting: Internal Medicine

## 2020-03-05 ENCOUNTER — Encounter (HOSPITAL_COMMUNITY)
Admission: RE | Admit: 2020-03-05 | Discharge: 2020-03-05 | Disposition: A | Discharge status: Home / Self Care | Payer: Medicare Other | Source: Ambulatory Visit | Attending: Internal Medicine | Admitting: Internal Medicine

## 2020-03-05 DIAGNOSIS — Z01812 Encounter for preprocedural laboratory examination: Secondary | ICD-10-CM | POA: Insufficient documentation

## 2020-03-05 DIAGNOSIS — Z20822 Contact with and (suspected) exposure to covid-19: Secondary | ICD-10-CM | POA: Diagnosis not present

## 2020-03-05 HISTORY — DX: Unspecified hearing loss, unspecified ear: H91.90

## 2020-03-06 LAB — SARS CORONAVIRUS 2 (TAT 6-24 HRS): SARS Coronavirus 2: NEGATIVE

## 2020-03-07 ENCOUNTER — Encounter (HOSPITAL_COMMUNITY)
Admission: RE | Disposition: A | Discharge status: Home / Self Care | Payer: Self-pay | Source: Home / Self Care | Attending: Internal Medicine

## 2020-03-07 ENCOUNTER — Ambulatory Visit (HOSPITAL_COMMUNITY)
Admission: RE | Admit: 2020-03-07 | Discharge: 2020-03-07 | Disposition: A | Discharge status: Home / Self Care | Payer: Medicare Other | Attending: Internal Medicine | Admitting: Internal Medicine

## 2020-03-07 ENCOUNTER — Encounter (HOSPITAL_COMMUNITY): Payer: Self-pay | Admitting: Internal Medicine

## 2020-03-07 ENCOUNTER — Ambulatory Visit (HOSPITAL_COMMUNITY): Payer: Medicare Other | Admitting: Certified Registered Nurse Anesthetist

## 2020-03-07 DIAGNOSIS — Z09 Encounter for follow-up examination after completed treatment for conditions other than malignant neoplasm: Secondary | ICD-10-CM | POA: Diagnosis present

## 2020-03-07 DIAGNOSIS — K640 First degree hemorrhoids: Secondary | ICD-10-CM | POA: Insufficient documentation

## 2020-03-07 DIAGNOSIS — Z7902 Long term (current) use of antithrombotics/antiplatelets: Secondary | ICD-10-CM | POA: Insufficient documentation

## 2020-03-07 DIAGNOSIS — Z955 Presence of coronary angioplasty implant and graft: Secondary | ICD-10-CM | POA: Diagnosis not present

## 2020-03-07 DIAGNOSIS — D124 Benign neoplasm of descending colon: Secondary | ICD-10-CM | POA: Insufficient documentation

## 2020-03-07 DIAGNOSIS — K635 Polyp of colon: Secondary | ICD-10-CM | POA: Diagnosis not present

## 2020-03-07 DIAGNOSIS — D122 Benign neoplasm of ascending colon: Secondary | ICD-10-CM | POA: Insufficient documentation

## 2020-03-07 DIAGNOSIS — J449 Chronic obstructive pulmonary disease, unspecified: Secondary | ICD-10-CM | POA: Diagnosis not present

## 2020-03-07 DIAGNOSIS — Z86010 Personal history of colonic polyps: Secondary | ICD-10-CM | POA: Diagnosis not present

## 2020-03-07 DIAGNOSIS — Z7982 Long term (current) use of aspirin: Secondary | ICD-10-CM | POA: Diagnosis not present

## 2020-03-07 DIAGNOSIS — Z79899 Other long term (current) drug therapy: Secondary | ICD-10-CM | POA: Insufficient documentation

## 2020-03-07 DIAGNOSIS — F17210 Nicotine dependence, cigarettes, uncomplicated: Secondary | ICD-10-CM | POA: Diagnosis not present

## 2020-03-07 HISTORY — PX: POLYPECTOMY: SHX5525

## 2020-03-07 HISTORY — PX: COLONOSCOPY WITH PROPOFOL: SHX5780

## 2020-03-07 SURGERY — COLONOSCOPY WITH PROPOFOL
Anesthesia: General

## 2020-03-07 MED ORDER — PROPOFOL 500 MG/50ML IV EMUL
INTRAVENOUS | Status: DC | PRN
  Administered 2020-03-07: 150 ug/kg/min via INTRAVENOUS

## 2020-03-07 MED ORDER — LIDOCAINE HCL (CARDIAC) PF 100 MG/5ML IV SOSY
PREFILLED_SYRINGE | INTRAVENOUS | Status: DC | PRN
  Administered 2020-03-07: 50 mg via INTRAVENOUS

## 2020-03-07 MED ORDER — LACTATED RINGERS IV SOLN
INTRAVENOUS | Status: DC
  Administered 2020-03-07: 1000 mL via INTRAVENOUS

## 2020-03-07 MED ORDER — STERILE WATER FOR IRRIGATION IR SOLN: Status: DC | PRN

## 2020-03-07 MED ORDER — PROPOFOL 10 MG/ML IV BOLUS
INTRAVENOUS | Status: DC | PRN
  Administered 2020-03-07: 70 mg via INTRAVENOUS

## 2020-03-07 NOTE — Discharge Instructions (Signed)
Colon Polyps  Colon polyps are tissue growths inside the colon, which is part of the large intestine. They are one of the types of polyps that can grow in the body. A polyp may be a round bump or a mushroom-shaped growth. You could have one polyp or more than one. Most colon polyps are noncancerous (benign). However, some colon polyps can become cancerous over time. Finding and removing the polyps early can help prevent this. What are the causes? The exact cause of colon polyps is not known. What increases the risk? The following factors may make you more likely to develop this condition:  Having a family history of colorectal cancer or colon polyps.  Being older than 66 years of age.  Being younger than 66 years of age and having a significant family history of colorectal cancer or colon polyps or a genetic condition that puts you at higher risk of getting colon polyps.  Having inflammatory bowel disease, such as ulcerative colitis or Crohn's disease.  Having certain conditions passed from parent to child (hereditary conditions), such as: ? Familial adenomatous polyposis (FAP). ? Lynch syndrome. ? Turcot syndrome. ? Peutz-Jeghers syndrome. ? MUTYH-associated polyposis (MAP).  Being overweight.  Certain lifestyle factors. These include smoking cigarettes, drinking too much alcohol, not getting enough exercise, and eating a diet that is high in fat and red meat and low in fiber.  Having had childhood cancer that was treated with radiation of the abdomen. What are the signs or symptoms? Many times, there are no symptoms. If you have symptoms, they may include:  Blood coming from the rectum during a bowel movement.  Blood in the stool (feces). The blood may be bright red or very dark in color.  Pain in the abdomen.  A change in bowel habits, such as constipation or diarrhea. How is this diagnosed? This condition is diagnosed with a colonoscopy. This is a procedure in which a  lighted, flexible scope is inserted into the opening between the buttocks (anus) and then passed into the colon to examine the area. Polyps are sometimes found when a colonoscopy is done as part of routine cancer screening tests. How is this treated? This condition is treated by removing any polyps that are found. Most polyps can be removed during a colonoscopy. Those polyps will then be tested for cancer. Additional treatment may be needed depending on the results of testing. Follow these instructions at home: Eating and drinking  Eat foods that are high in fiber, such as fruits, vegetables, and whole grains.  Eat foods that are high in calcium and vitamin D, such as milk, cheese, yogurt, eggs, liver, fish, and broccoli.  Limit foods that are high in fat, such as fried foods and desserts.  Limit the amount of red meat, precooked or cured meat, or other processed meat that you eat, such as hot dogs, sausages, bacon, or meat loaves.  Limit sugary drinks.   Lifestyle  Maintain a healthy weight, or lose weight if recommended by your health care provider.  Exercise every day or as told by your health care provider.  Do not use any products that contain nicotine or tobacco, such as cigarettes, e-cigarettes, and chewing tobacco. If you need help quitting, ask your health care provider.  Do not drink alcohol if: ? Your health care provider tells you not to drink. ? You are pregnant, may be pregnant, or are planning to become pregnant.  If you drink alcohol: ? Limit how much you use to:  0-1 drink a day for women.  0-2 drinks a day for men. ? Know how much alcohol is in your drink. In the [place] one drink equals one 12 oz bottle of beer (355 mL), one 5 oz glass of wine (148 mL), or one 1 oz glass of hard liquor (44 mL). General instructions  Take over-the-counter and prescription medicines only as told by your health care provider.  Keep all follow-up visits. This is important. This  includes having regularly scheduled colonoscopies. Talk to your health care provider about when you need a colonoscopy. Contact a health care provider if:  You have new or worsening bleeding during a bowel movement.  You have new or increased blood in your stool.  You have a change in bowel habits.  You lose weight for no known reason. Summary  Colon polyps are tissue growths inside the colon, which is part of the large intestine. They are one type of polyp that can grow in the body.  Most colon polyps are noncancerous (benign), but some can become cancerous over time.  This condition is diagnosed with a colonoscopy.  This condition is treated by removing any polyps that are found. Most polyps can be removed during a colonoscopy. This information is not intended to replace advice given to you by your health care provider. Make sure you discuss any questions you have with your health care provider. Document Revised: 05/03/2019 Document Reviewed: 05/03/2019 Elsevier Patient Education  2021 [place].  Colonoscopy Discharge Instructions  Read the instructions outlined below and refer to this sheet in the next few weeks. These discharge instructions provide you with general information on caring for yourself after you leave the hospital. Your doctor may also give you specific instructions. While your treatment has been planned according to the most current medical practices available, unavoidable complications occasionally occur. If you have any problems or questions after discharge, call Dr. Gala Romney at [phone]. ACTIVITY  You may resume your regular activity, but move at a slower pace for the next 24 hours.   Take frequent rest periods for the next 24 hours.   Walking will help get rid of the air and reduce the bloated feeling in your belly (abdomen).   No driving for 24 hours (because of the medicine (anesthesia) used during the test).    Do not sign any important legal documents  or operate any machinery for 24 hours (because of the anesthesia used during the test).  NUTRITION  Drink plenty of fluids.   You may resume your normal diet as instructed by your doctor.   Begin with a light meal and progress to your normal diet. Heavy or fried foods are harder to digest and may make you feel sick to your stomach (nauseated).   Avoid alcoholic beverages for 24 hours or as instructed.  MEDICATIONS  You may resume your normal medications unless your doctor tells you otherwise.  WHAT YOU CAN EXPECT TODAY  Some feelings of bloating in the abdomen.   Passage of more gas than usual.   Spotting of blood in your stool or on the toilet paper.  IF YOU HAD POLYPS REMOVED DURING THE COLONOSCOPY:  No aspirin products for 7 days or as instructed.   No alcohol for 7 days or as instructed.   Eat a soft diet for the next 24 hours.  FINDING OUT THE RESULTS OF YOUR TEST Not all test results are available during your visit. If your test results are not back during the visit,  make an appointment with your caregiver to find out the results. Do not assume everything is normal if you have not heard from your caregiver or the medical facility. It is important for you to follow up on all of your test results.  SEEK IMMEDIATE MEDICAL ATTENTION IF:  You have more than a spotting of blood in your stool.   Your belly is swollen (abdominal distention).   You are nauseated or vomiting.   You have a temperature over 101.   You have abdominal pain or discomfort that is severe or gets worse throughout the day.   The large polyp in your rectum was found to have been completely removed last time.  You did have additional small polyps which were also removed today  Further recommendations to follow pending review of pathology report  You should continue Plavix and aspirin uninterrupted  No MRI until clip gone  At patient request, called Saifan Rayford at [phone] -unable to  complete call

## 2020-03-07 NOTE — H&P (Signed)
@LOGO @   Primary Care Physician:  Alycia Rossetti, MD Primary Gastroenterologist:  Dr. Gala Romney  Pre-Procedure History & Physical: HPI:  Bill Johnson is a 65 y.o. male here for surveillance colonoscopy.  History of multiple adenomatous and tubulovillous adenomas removed from his colon and rectum in April 2021.  Large rectal polyp was piecemeal removed.  He is here for surveillance examination per plan.  He remains on Plavix get given recent PCI /stent placement.  Past Medical History:  Diagnosis Date  . Alcohol abuse   . Allergy    seasonal  . Anxiety   . CAD (coronary artery disease)    a. 06/2019 cath - s/p successful PCI of the ostial to mid LAD with IVUS guidance and DES x 2.  . Depression   . Essential hypertension   . GERD (gastroesophageal reflux disease)   . HOH (hard of hearing)   . Hyperlipidemia   . Hypertension   . OA (osteoarthritis) of knee   . Pulmonary nodules    a. seen on coronary CT 05/2019, will need OP f/u.  Marland Kitchen Rotator cuff tear arthropathy of both shoulders    On DISABILITY, non operative case  . Shoulder pain   . Tobacco abuse   . Vertigo     Past Surgical History:  Procedure Laterality Date  . CARDIAC CATHETERIZATION  06/2005   no significant CAD  . COLONOSCOPY WITH PROPOFOL N/A 05/01/2019   Procedure: COLONOSCOPY WITH PROPOFOL;  Surgeon: Daneil Dolin, MD;  [facility];  Service: Endoscopy;  Laterality: N/A;  7:30am  . CORONARY STENT INTERVENTION N/A 07/05/2019   Procedure: CORONARY STENT INTERVENTION;  Surgeon: Martinique, Peter M, MD;  Location: [place] [facility];  Service: Cardiovascular;  Laterality: N/A;  . CORONARY STENT INTERVENTION N/A 07/20/2019   Procedure: CORONARY STENT INTERVENTION;  Surgeon: Jettie Booze, MD;  Location: [place] [facility];  Service: Cardiovascular;  Laterality: N/A;  . ESOPHAGOGASTRODUODENOSCOPY  08/2005   Dr. Henrene Pastor: small hiatal hernia  . HERNIA REPAIR  1983  . INTRAVASCULAR PRESSURE WIRE/FFR STUDY N/A  07/05/2019   Procedure: INTRAVASCULAR PRESSURE WIRE/FFR STUDY;  Surgeon: Martinique, Peter M, MD;  Location: [place] [facility];  Service: Cardiovascular;  Laterality: N/A;  . INTRAVASCULAR ULTRASOUND/IVUS N/A 07/05/2019   Procedure: Intravascular Ultrasound/IVUS;  Surgeon: Martinique, Peter M, MD;  Location: [place] [facility];  Service: Cardiovascular;  Laterality: N/A;  . LEFT HEART CATH AND CORONARY ANGIOGRAPHY N/A 07/05/2019   Procedure: LEFT HEART CATH AND CORONARY ANGIOGRAPHY;  Surgeon: Martinique, Peter M, MD;  Location: [place] [facility];  Service: Cardiovascular;  Laterality: N/A;  . LEFT HEART CATH AND CORONARY ANGIOGRAPHY N/A 07/20/2019   Procedure: LEFT HEART CATH AND CORONARY ANGIOGRAPHY;  Surgeon: Jettie Booze, MD;  Location: [place] [facility];  Service: Cardiovascular;  Laterality: N/A;  . POLYPECTOMY  05/01/2019   Procedure: POLYPECTOMY;  Surgeon: Daneil Dolin, MD;  [facility];  Service: Endoscopy;;  . ROTATOR CUFF REPAIR Bilateral   . TONSILLECTOMY  1977    Prior to Admission medications   Medication Sig Start Date End Date Taking? Authorizing Provider  acetaminophen (TYLENOL) 500 MG tablet Take 500 mg by mouth as needed for mild pain or headache.    Yes [provider]  amLODipine (NORVASC) 5 MG tablet Take 1 tablet (5 mg total) by mouth daily. 05/03/19 05/02/20 Yes [place], Modena Nunnery, MD  aspirin 81 MG tablet Take 81 mg by mouth daily.  Yes [provider]  atenolol (TENORMIN) 50 MG tablet Take 1 tablet (50 mg total) by mouth daily. 12/14/19  Yes [place], Modena Nunnery, MD  atorvastatin (LIPITOR) 80 MG tablet Take 1 tablet (80 mg total) by mouth every evening. 12/01/19  Yes [place], Modena Nunnery, MD  clopidogrel (PLAVIX) 75 MG tablet Take 1 tablet (75 mg total) by mouth daily. 08/04/19  Yes [place], Modena Nunnery, MD  [facility] VITAMIN B-12 2000 MCG TBCR Take 2,000 mcg by mouth daily.   Yes [provider]  diazepam (VALIUM) 5 MG tablet Take 1 tablet (5 mg total) by  mouth every 12 (twelve) hours as needed for anxiety. 02/15/20  Yes [place], Modena Nunnery, MD  isosorbide mononitrate (IMDUR) 30 MG 24 hr tablet Take 1 tablet (30 mg total) by mouth daily. 01/24/20  Yes Verta Ellen., NP  meclizine (ANTIVERT) 25 MG tablet TAKE 1 TABLET BY MOUTH THREE TIMES DAILY AS NEEDED FOR DIZZINESS Patient taking differently: Take 25 mg by mouth 3 (three) times daily as needed for dizziness. 02/15/20  Yes [place], Modena Nunnery, MD  pantoprazole (PROTONIX) 40 MG tablet Take 1 tablet (40 mg total) by mouth 2 (two) times daily. 02/15/20  Yes [place], Modena Nunnery, MD  umeclidinium-vilanterol [facility] ELLIPTA) 62.5-25 MCG/INH AEPB Inhale 1 puff into the lungs daily. 09/28/19  Yes [place], Modena Nunnery, MD  albuterol (PROVENTIL HFA;VENTOLIN HFA) 108 (90 Base) MCG/ACT inhaler Inhale 2 puffs into the lungs every 6 (six) hours as needed for wheezing or shortness of breath. 10/13/17   Orlena Sheldon, PA-C  nitroGLYCERIN (NITROSTAT) 0.4 MG SL tablet Place 1 tablet (0.4 mg total) under the tongue every 5 (five) minutes as needed for chest pain (up to 3 doses). 07/06/19 07/05/20  Charlie Pitter, PA-C    Allergies as of 01/12/2020  . (No Known Allergies)    Family History  Problem Relation Age of Onset  . Heart disease Mother   . Diabetes Mother   . Hypertension Mother   . Heart disease Father   . Hypertension Father   . Stroke Other   . Cancer Other   . Cancer Brother        prostate  . Colon cancer Neg Hx     Social History   Socioeconomic History  . Marital status: Divorced    Spouse name: Not on file  . Number of children: 2  . Years of education: 63  . Highest education level: Not on file  Occupational History  . Occupation: Film/video editor Designer, television/film set)  Tobacco Use  . Smoking status: Current Every Day Smoker    Packs/day: 1.00    Years: 43.00    Pack years: 43.00    Types: Cigarettes    Start date: 04/15/1970  . Smokeless tobacco: Never Used  . Tobacco comment: uses cigara and a  pack lasts 4-5 days  Vaping Use  . Vaping Use: Never used  Substance and Sexual Activity  . Alcohol use: Yes    Comment: Drinks 3-4 beers/day   . Drug use: No  . Sexual activity: Yes  Other Topics Concern  . Not on file  Social History Narrative   Lives at home with nephew and niece in-law   Caffeine use: Drinks coffee/soda every day   Social Determinants of Health   Financial Resource Strain: Not on file  Food Insecurity: Not on file  Transportation Needs: Not on file  Physical Activity: Not on file  Stress: Not on file  Social Connections: Not on  file  Intimate Partner Violence: Not on file    Review of Systems: See HPI, otherwise negative ROS  Physical Exam: BP (!) 146/82   Pulse 64   Temp 98.6 F (37 C) (Oral)   Resp 15   SpO2 98%  General:   Alert,  Well-developed, well-nourished, pleasant and cooperative in NAD Neck:  Supple; no masses or thyromegaly. No significant cervical adenopathy. Lungs:  Clear throughout to auscultation.   No wheezes, crackles, or rhonchi. No acute distress. Heart:  Regular rate and rhythm; no murmurs, clicks, rubs,  or gallops. Abdomen: Non-distended, normal bowel sounds.  Soft and nontender without appreciable mass or hepatosplenomegaly.  Pulses:  Normal pulses noted. Extremities:  Without clubbing or edema.  Impression/Plan: 65 year old gentleman history of multiple adenomatous/tubulovillous adenomas removed his rectum and colon last year.  Large rectal lesion removed piecemeal.  He is here for colonoscopy.  PCI since last colonoscopy.  He has been on Plavix.     Recommendations:   I have recommended a surveillance colonoscopy today per plan risk benefits limitations alternatives have been reviewed.  He is remaining on Plavix per plan.  Increased risk of bleeding reviewed.    Further recommendation s to follow.     Notice: This dictation was prepared with Dragon dictation along with smaller phrase technology. Any  transcriptional errors that result from this process are unintentional and may not be corrected upon review.

## 2020-03-07 NOTE — Anesthesia Postprocedure Evaluation (Signed)
Anesthesia Post Note  Patient: Bill Johnson  Procedure(s) Performed: COLONOSCOPY WITH PROPOFOL (N/A ) POLYPECTOMY  Patient location during evaluation: Phase II Anesthesia Type: General Level of consciousness: awake and alert, oriented and patient cooperative Pain management: satisfactory to patient Vital Signs Assessment: post-procedure vital signs reviewed and stable Respiratory status: spontaneous breathing Cardiovascular status: stable and blood pressure returned to baseline Postop Assessment: no apparent nausea or vomiting Anesthetic complications: no   No complications documented.   Last Vitals:  Vitals:   03/07/20 0710 03/07/20 0855  BP: (!) 146/82 (!) 133/50  Pulse: 64 64  Resp: 15 15  Temp: 37 C 36.7 C  SpO2: 98% 97%    Last Pain:  Vitals:   03/07/20 0855  TempSrc: Oral  PainSc: 0-No pain                 Karna Dupes

## 2020-03-07 NOTE — Op Note (Signed)
[facility] Patient Name: Bill Johnson Procedure Date: 03/07/2020 8:11 AM MRN: [id number] Date of Birth: 04/13/1955 Attending MD: Norvel Richards , MD CSN: [id number] Age: 65 Admit Type: Outpatient Procedure:                Colonoscopy Indications:              High risk colon cancer surveillance: Personal                            history of colonic polyps Providers:                Norvel Richards, MD, Tammy Vaught, RN,                            Kristine L. Risa Grill, Technician, Nelma Rothman,                            Merchant navy officer Referring MD:              Medicines:                Propofol per Anesthesia Complications:            No immediate complications. Estimated Blood Loss:     Estimated blood loss was minimal. Procedure:                Pre-Anesthesia Assessment:                           - Prior to the procedure, a History and Physical                            was performed, and patient medications and                            allergies were reviewed. The patient's tolerance of                            previous anesthesia was also reviewed. The risks                            and benefits of the procedure and the sedation                            options and risks were discussed with the patient.                            All questions were answered, and informed consent                            was obtained. Prior Anticoagulants: The patient                            last took Pletal (cilostazol) 1 day prior to the  procedure. ASA Grade Assessment: III - A patient                            with severe systemic disease. After reviewing the                            risks and benefits, the patient was deemed in                            satisfactory condition to undergo the procedure.                           After obtaining informed consent, the colonoscope                            was passed under direct vision.  Throughout the                            procedure, the patient's blood pressure, pulse, and                            oxygen saturations were monitored continuously. The                            CF-HQ190L ([id number]) scope was introduced through                            the anus and advanced to the the cecum, identified                            by appendiceal orifice and ileocecal valve. The                            colonoscopy was performed without difficulty. The                            patient tolerated the procedure well. The quality                            of the bowel preparation was adequate. Scope In: 8:31:01 AM Scope Out: 8:50:46 AM Scope Withdrawal Time: 0 hours 15 minutes 36 seconds  Total Procedure Duration: 0 hours 19 minutes 45 seconds  Findings:      The perianal and digital rectal examinations were normal.      Non-bleeding internal hemorrhoids were found during retroflexion. The       hemorrhoids were moderate, medium-sized and Grade I (internal       hemorrhoids that do not prolapse).      Six sessile polyps were found in the descending colon and ascending       colon. The polyps were 3 to 6 mm in size. These polyps were removed with       a cold snare. Resection and retrieval were complete. Estimated blood       loss was minimal.      A 8 mm polyp was found  in the ascending colon. The polyp was flat. The       polyp was removed with a hot snare. Resection and retrieval were       complete. Estimated blood loss: none. Clip x 1 to assure hemostasis.      The exam was otherwise without abnormality on direct and retroflexion       views. Impression:               - Non-bleeding internal hemorrhoids.                           - Six 3 to 6 mm polyps in the descending colon and                            in the ascending colon, removed with a cold snare.                            Resected and retrieved.                           - One 8 mm polyp in the  ascending colon, removed                            with a hot snare. Resected and retrieved. Clip x 1                           - The examination was otherwise normal on direct                            and retroflexion views. Moderate Sedation:      Moderate (conscious) sedation was personally administered by an       anesthesia professional. The following parameters were monitored: oxygen       saturation, heart rate, blood pressure, respiratory rate, EKG, adequacy       of pulmonary ventilation, and response to care. Recommendation:           - Patient has a contact number available for                            emergencies. The signs and symptoms of potential                            delayed complications were discussed with the                            patient. Return to normal activities tomorrow.                            Written discharge instructions were provided to the                            patient.                           - Resume previous diet.                           -  Continue present medications.                           - Repeat colonoscopy date to be determined after                            pending pathology results are reviewed for                            surveillance based on pathology results.                           - Return to GI office in 6 months. No MRI until                            clip gone; continuie ASA/ plavix uninterrupted. Procedure Code(s):        --- Professional ---                           [phone], Colonoscopy, flexible; with removal of                            tumor(s), polyp(s), or other lesion(s) by snare                            technique Diagnosis Code(s):        --- Professional ---                           K63.5, Polyp of colon                           Z86.010, Personal history of colonic polyps                           K64.0, First degree hemorrhoids CPT copyright 2019 American [employer]. All  rights reserved. The codes documented in this report are preliminary and upon coder review may  be revised to meet current compliance requirements. Cristopher Estimable. Chick Cousins, MD Norvel Richards, MD 03/07/2020 9:01:51 AM This report has been signed electronically. Number of Addenda: 0

## 2020-03-07 NOTE — Anesthesia Preprocedure Evaluation (Signed)
Anesthesia Evaluation  Patient identified by MRN, date of birth, ID band Patient awake    Reviewed: Allergy & Precautions, H&P , NPO status , Patient's Chart, lab work & pertinent test results, reviewed documented beta blocker date and time   Airway Mallampati: II  TM Distance: >3 FB Neck ROM: full    Dental no notable dental hx. (+) Loose, Missing   Pulmonary COPD, Current Smoker,    Pulmonary exam normal breath sounds clear to auscultation       Cardiovascular Exercise Tolerance: Good hypertension, + angina + CAD and + Cardiac Stents   Rhythm:regular Rate:Normal     Neuro/Psych PSYCHIATRIC DISORDERS Anxiety Depression negative neurological ROS     GI/Hepatic Neg liver ROS, GERD  Medicated,  Endo/Other  negative endocrine ROS  Renal/GU negative Renal ROS  negative genitourinary   Musculoskeletal   Abdominal   Peds  Hematology negative hematology ROS (+)   Anesthesia Other Findings One very loose bottom tooth.  Reproductive/Obstetrics negative OB ROS                             Anesthesia Physical Anesthesia Plan  ASA: III  Anesthesia Plan: General   Post-op Pain Management:    Induction:   PONV Risk Score and Plan: Propofol infusion  Airway Management Planned:   Additional Equipment:   Intra-op Plan:   Post-operative Plan:   Informed Consent: I have reviewed the patients History and Physical, chart, labs and discussed the procedure including the risks, benefits and alternatives for the proposed anesthesia with the patient or authorized representative who has indicated his/her understanding and acceptance.     Dental Advisory Given  Plan Discussed with: CRNA  Anesthesia Plan Comments:         Anesthesia Quick Evaluation

## 2020-03-07 NOTE — Transfer of Care (Signed)
Immediate Anesthesia Transfer of Care Note  Patient: Bill Johnson  Procedure(s) Performed: COLONOSCOPY WITH PROPOFOL (N/A ) POLYPECTOMY  Patient Location: PACU  Anesthesia Type:General  Level of Consciousness: awake, alert  and oriented  Airway & Oxygen Therapy: Patient Spontanous Breathing  Post-op Assessment: Report given to RN and Post -op Vital signs reviewed and stable  Post vital signs: Reviewed and stable  Last Vitals:  Vitals Value Taken Time  BP 133/50 03/07/20 0855  Temp 36.7 C 03/07/20 0855  Pulse 64 03/07/20 0855  Resp 15 03/07/20 0855  SpO2 97 % 03/07/20 0855    Last Pain:  Vitals:   03/07/20 0855  TempSrc: Oral  PainSc: 0-No pain      Patients Stated Pain Goal: 8 (24/19/91 4445)  Complications: No complications documented.

## 2020-03-08 ENCOUNTER — Encounter: Payer: Self-pay | Admitting: Internal Medicine

## 2020-03-08 LAB — SURGICAL PATHOLOGY

## 2020-03-11 ENCOUNTER — Telehealth: Payer: Self-pay | Admitting: Family Medicine

## 2020-03-11 MED ORDER — ATENOLOL 50 MG PO TABS: 50.0000 mg | ORAL_TABLET | Freq: Every day | ORAL | 0 refills | Status: DC

## 2020-03-11 NOTE — Telephone Encounter (Signed)
Pt came in needing a refill of  atenolol (TENORMIN) 50 MG tablet Sent to [facility] in [place]  Cb# [phone]

## 2020-03-11 NOTE — Telephone Encounter (Signed)
Prescription sent to pharmacy.

## 2020-03-12 ENCOUNTER — Encounter (HOSPITAL_COMMUNITY): Payer: Self-pay | Admitting: Internal Medicine

## 2020-04-15 ENCOUNTER — Ambulatory Visit (INDEPENDENT_AMBULATORY_CARE_PROVIDER_SITE_OTHER): Payer: Medicare Other | Admitting: Internal Medicine

## 2020-04-15 ENCOUNTER — Encounter: Payer: Self-pay | Admitting: Internal Medicine

## 2020-04-15 ENCOUNTER — Other Ambulatory Visit: Payer: Self-pay

## 2020-04-15 VITALS — BP 127/79 | HR 67 | Temp 98.1°F | Resp 18 | Ht 64.0 in | Wt 172.4 lb

## 2020-04-15 DIAGNOSIS — F411 Generalized anxiety disorder: Secondary | ICD-10-CM

## 2020-04-15 DIAGNOSIS — M19012 Primary osteoarthritis, left shoulder: Secondary | ICD-10-CM

## 2020-04-15 DIAGNOSIS — J439 Emphysema, unspecified: Secondary | ICD-10-CM | POA: Diagnosis not present

## 2020-04-15 DIAGNOSIS — M47892 Other spondylosis, cervical region: Secondary | ICD-10-CM | POA: Diagnosis not present

## 2020-04-15 DIAGNOSIS — Z7689 Persons encountering health services in other specified circumstances: Secondary | ICD-10-CM | POA: Diagnosis not present

## 2020-04-15 DIAGNOSIS — D126 Benign neoplasm of colon, unspecified: Secondary | ICD-10-CM | POA: Diagnosis not present

## 2020-04-15 DIAGNOSIS — I2510 Atherosclerotic heart disease of native coronary artery without angina pectoris: Secondary | ICD-10-CM | POA: Diagnosis not present

## 2020-04-15 DIAGNOSIS — H833X3 Noise effects on inner ear, bilateral: Secondary | ICD-10-CM | POA: Diagnosis not present

## 2020-04-15 DIAGNOSIS — I10 Essential (primary) hypertension: Secondary | ICD-10-CM | POA: Diagnosis not present

## 2020-04-15 DIAGNOSIS — M19011 Primary osteoarthritis, right shoulder: Secondary | ICD-10-CM

## 2020-04-15 DIAGNOSIS — K219 Gastro-esophageal reflux disease without esophagitis: Secondary | ICD-10-CM | POA: Diagnosis not present

## 2020-04-15 MED ORDER — CLOPIDOGREL BISULFATE 75 MG PO TABS: 75.0000 mg | ORAL_TABLET | Freq: Every day | ORAL | 0 refills | Status: DC

## 2020-04-15 MED ORDER — ATENOLOL 50 MG PO TABS: 50.0000 mg | ORAL_TABLET | Freq: Every day | ORAL | 2 refills | Status: DC

## 2020-04-15 NOTE — Assessment & Plan Note (Signed)
Follows up with a Spine specialist, gets steroid injections

## 2020-04-15 NOTE — Assessment & Plan Note (Signed)
Wears hearing aides 

## 2020-04-15 NOTE — H&P (View-Only) (Signed)
New Patient Office Visit  Subjective:  Patient ID: Bill Johnson, male    DOB: November 18, 1955  Age: 65 y.o. MRN: [id number]  CC:  Chief Complaint  Patient presents with  . New Patient (Initial Visit)    New patient just establishing care and needs med refills     HPI Bill Johnson is a 65 year old male with PMH of CAD s/p stent placement, hypertension, COPD, GERD, OA of shoulder, cervical spondylosis, anxiety and chronic knee pain who presents for establishing care. He is a former patient of Dr. [name].  He has been doing well overall.  He is on DAPT and statin for history of CAD and follows up with cardiologist.  His BP is well controlled.  He takes his medications regularly.  He denies any headache, dizziness, chest pain, dyspnea or palpitations.  He takes pantoprazole for GERD.  Denies any dysphagia or odynophagia.  He quit smoking in 06/2019 after stent placement.  He smoked for about 50 years.  He takes Valium 5 mg for anxiety, but requires it once in a day on most of the days.  He denies anhedonia, SI or HI.  He is up to date with COVID and flu vaccine.  Past Medical History:  Diagnosis Date  . Alcohol abuse   . Allergy    seasonal  . Anxiety   . CAD (coronary artery disease)    a. 06/2019 cath - s/p successful PCI of the ostial to mid LAD with IVUS guidance and DES x 2.  . Depression   . Essential hypertension   . GERD (gastroesophageal reflux disease)   . HOH (hard of hearing)   . Hyperlipidemia   . Hypertension   . OA (osteoarthritis) of knee   . Pulmonary nodules    a. seen on coronary CT 05/2019, will need OP f/u.  Marland Kitchen Rotator cuff tear arthropathy of both shoulders    On DISABILITY, non operative case  . Shoulder pain   . Tobacco abuse   . Vertigo     Past Surgical History:  Procedure Laterality Date  . CARDIAC CATHETERIZATION  06/2005   no significant CAD  . COLONOSCOPY WITH PROPOFOL N/A 05/01/2019   Procedure: COLONOSCOPY WITH PROPOFOL;  Surgeon:  Daneil Dolin, MD;  [facility];  Service: Endoscopy;  Laterality: N/A;  7:30am  . COLONOSCOPY WITH PROPOFOL N/A 03/07/2020   Procedure: COLONOSCOPY WITH PROPOFOL;  Surgeon: Daneil Dolin, MD;  [facility];  Service: Endoscopy;  Laterality: N/A;  8:30am  . CORONARY STENT INTERVENTION N/A 07/05/2019   Procedure: CORONARY STENT INTERVENTION;  Surgeon: Martinique, Peter M, MD;  Location: [facility];  Service: Cardiovascular;  Laterality: N/A;  . CORONARY STENT INTERVENTION N/A 07/20/2019   Procedure: CORONARY STENT INTERVENTION;  Surgeon: Jettie Booze, MD;  Location: [facility];  Service: Cardiovascular;  Laterality: N/A;  . ESOPHAGOGASTRODUODENOSCOPY  08/2005   Dr. Henrene Pastor: small hiatal hernia  . HERNIA REPAIR  1983  . INTRAVASCULAR PRESSURE WIRE/FFR STUDY N/A 07/05/2019   Procedure: INTRAVASCULAR PRESSURE WIRE/FFR STUDY;  Surgeon: Martinique, Peter M, MD;  Location: [facility];  Service: Cardiovascular;  Laterality: N/A;  . INTRAVASCULAR ULTRASOUND/IVUS N/A 07/05/2019   Procedure: Intravascular Ultrasound/IVUS;  Surgeon: Martinique, Peter M, MD;  Location: [facility];  Service: Cardiovascular;  Laterality: N/A;  . LEFT HEART CATH AND CORONARY ANGIOGRAPHY N/A 07/05/2019   Procedure: LEFT HEART CATH AND CORONARY ANGIOGRAPHY;  Surgeon: Martinique, Peter M, MD;  Location: [facility];  Service: Cardiovascular;  Laterality: N/A;  . LEFT HEART CATH AND CORONARY ANGIOGRAPHY N/A 07/20/2019   Procedure: LEFT HEART CATH AND CORONARY ANGIOGRAPHY;  Surgeon: Jettie Booze, MD;  Location: [facility];  Service: Cardiovascular;  Laterality: N/A;  . POLYPECTOMY  05/01/2019   Procedure: POLYPECTOMY;  Surgeon: Daneil Dolin, MD;  [facility];  Service: Endoscopy;;  . POLYPECTOMY  03/07/2020   Procedure: POLYPECTOMY;  Surgeon: Daneil Dolin, MD;  [facility];  Service: Endoscopy;;  . ROTATOR CUFF REPAIR Bilateral   .  TONSILLECTOMY  1977    Family History  Problem Relation Age of Onset  . Heart disease Mother   . Diabetes Mother   . Hypertension Mother   . Heart disease Father   . Hypertension Father   . Stroke Other   . Cancer Other   . Cancer Brother        prostate  . Colon cancer Neg Hx     Social History   Socioeconomic History  . Marital status: Divorced    Spouse name: Not on file  . Number of children: 2  . Years of education: 53  . Highest education level: Not on file  Occupational History  . Occupation: Film/video editor Designer, television/film set)  Tobacco Use  . Smoking status: Current Every Day Smoker    Packs/day: 1.00    Years: 43.00    Pack years: 43.00    Types: Cigarettes    Start date: 04/15/1970  . Smokeless tobacco: Never Used  . Tobacco comment: uses cigara and a pack lasts 4-5 days  Vaping Use  . Vaping Use: Never used  Substance and Sexual Activity  . Alcohol use: Yes    Comment: Drinks 3-4 beers/day   . Drug use: No  . Sexual activity: Yes  Other Topics Concern  . Not on file  Social History Narrative   Lives at home with nephew and niece in-law   Caffeine use: Drinks coffee/soda every day   Social Determinants of Radio broadcast assistant Strain: Not on file  Food Insecurity: Not on file  Transportation Needs: Not on file  Physical Activity: Not on file  Stress: Not on file  Social Connections: Not on file  Intimate Partner Violence: Not on file    ROS Review of Systems  Constitutional: Negative for chills and fever.  HENT: Negative for congestion and sore throat.   Eyes: Negative for pain and discharge.  Respiratory: Negative for cough and shortness of breath.   Cardiovascular: Negative for chest pain and palpitations.  Gastrointestinal: Negative for constipation, diarrhea, nausea and vomiting.  Endocrine: Negative for polydipsia and polyuria.  Genitourinary: Negative for dysuria and hematuria.  Musculoskeletal: Positive for arthralgias, back pain and  neck pain. Negative for neck stiffness.  Skin: Negative for rash.  Neurological: Negative for dizziness, weakness, numbness and headaches.  Psychiatric/Behavioral: Negative for agitation and behavioral problems.    Objective:   Today's Vitals: BP 127/79 (BP Location: Right Arm, Patient Position: Sitting, Cuff Size: Normal)   Pulse 67   Temp 98.1 F (36.7 C) (Oral)   Resp 18   Ht 5\' 4"  (1.626 m)   Wt 172 lb 6.4 oz (78.2 kg)   SpO2 99%   BMI 29.59 kg/m   Physical Exam Vitals reviewed.  Constitutional:      General: He is not in acute distress.    Appearance: He is not diaphoretic.  HENT:  Head: Normocephalic and atraumatic.     Nose: Nose normal.     Mouth/Throat:     Mouth: Mucous membranes are moist.  Eyes:     General: No scleral icterus.    Extraocular Movements: Extraocular movements intact.     Pupils: Pupils are equal, round, and reactive to light.  Cardiovascular:     Rate and Rhythm: Normal rate and regular rhythm.     Pulses: Normal pulses.     Heart sounds: Normal heart sounds. No murmur heard.   Pulmonary:     Breath sounds: Normal breath sounds. No wheezing or rales.  Abdominal:     Palpations: Abdomen is soft.     Tenderness: There is no abdominal tenderness.  Musculoskeletal:     Cervical back: Neck supple. No tenderness.     Right lower leg: No edema.     Left lower leg: No edema.  Skin:    General: Skin is warm.     Findings: No rash.  Neurological:     General: No focal deficit present.     Mental Status: He is alert and oriented to person, place, and time.     Sensory: No sensory deficit.     Motor: No weakness.  Psychiatric:        Mood and Affect: Mood normal.        Behavior: Behavior normal.     Assessment & Plan:   Encounter to establish care Care established Previous chart reviewed History and medications reviewed with the patient  CAD, multiple vessel S/p stent placement in 06/2019 On DAPT and statin Follows up with  Cardiologist  Essential (primary) hypertension BP Readings from Last 1 Encounters:  04/15/20 127/79   Well-controlled with Amlodipine, Atenolol and Imdur Counseled for compliance with the medications Advised DASH diet and moderate exercise/walking, at least 150 mins/week  COPD (chronic obstructive pulmonary disease) ([place]) Well-controlled with Anoro Albuterol PRN.  GERD (gastroesophageal reflux disease) On Pantoprazole 40 mg QD  Tubulovillous adenoma of colon Had multiple polyps in last colonoscopy in 2021, next colonoscopy in 2025  OA (osteoarthritis) of shoulder Follows up with Sports Medicine, gets steroid injections  Other spondylosis, cervical region Follows up with a Spine specialist, gets steroid injections  GAD (generalized anxiety disorder) Well-controlled with Valium, takes 5 mg q12h PRN - although takes it QD most of the days.  Hearing loss Wears hearing aides     Outpatient Encounter Medications as of 04/15/2020  Medication Sig  . acetaminophen (TYLENOL) 500 MG tablet Take 500 mg by mouth as needed for mild pain or headache.   . albuterol (PROVENTIL HFA;VENTOLIN HFA) 108 (90 Base) MCG/ACT inhaler Inhale 2 puffs into the lungs every 6 (six) hours as needed for wheezing or shortness of breath.  Marland Kitchen amLODipine (NORVASC) 5 MG tablet Take 1 tablet (5 mg total) by mouth daily.  Marland Kitchen aspirin 81 MG tablet Take 81 mg by mouth daily.  Marland Kitchen atorvastatin (LIPITOR) 80 MG tablet Take 1 tablet (80 mg total) by mouth every evening.  . CVS VITAMIN B-12 2000 MCG TBCR Take 2,000 mcg by mouth daily.  . diazepam (VALIUM) 5 MG tablet Take 1 tablet (5 mg total) by mouth every 12 (twelve) hours as needed for anxiety.  . isosorbide mononitrate (IMDUR) 30 MG 24 hr tablet Take 1 tablet (30 mg total) by mouth daily.  . meclizine (ANTIVERT) 25 MG tablet TAKE 1 TABLET BY MOUTH THREE TIMES DAILY AS NEEDED FOR DIZZINESS (Patient taking differently: Take 25 mg by  mouth 3 (three) times daily as needed  for dizziness.)  . nitroGLYCERIN (NITROSTAT) 0.4 MG SL tablet Place 1 tablet (0.4 mg total) under the tongue every 5 (five) minutes as needed for chest pain (up to 3 doses).  . pantoprazole (PROTONIX) 40 MG tablet Take 1 tablet (40 mg total) by mouth 2 (two) times daily.  Marland Kitchen umeclidinium-vilanterol (ANORO ELLIPTA) 62.5-25 MCG/INH AEPB Inhale 1 puff into the lungs daily.  . [DISCONTINUED] atenolol (TENORMIN) 50 MG tablet Take 1 tablet (50 mg total) by mouth daily.  . [DISCONTINUED] clopidogrel (PLAVIX) 75 MG tablet Take 1 tablet (75 mg total) by mouth daily.  Marland Kitchen atenolol (TENORMIN) 50 MG tablet Take 1 tablet (50 mg total) by mouth daily.  . clopidogrel (PLAVIX) 75 MG tablet Take 1 tablet (75 mg total) by mouth daily.   No facility-administered encounter medications on file as of 04/15/2020.    Follow-up: Return in about 4 months (around 08/15/2020) for HTN and CAD.   Lindell Spar, MD

## 2020-04-15 NOTE — Assessment & Plan Note (Signed)
Well-controlled with Valium, takes 5 mg q12h PRN - although takes it QD most of the days.

## 2020-04-15 NOTE — Assessment & Plan Note (Signed)
S/p stent placement in 06/2019 On DAPT and statin Follows up with Cardiologist

## 2020-04-15 NOTE — Progress Notes (Signed)
New Patient Office Visit  Subjective:  Patient ID: Bill Johnson, male    DOB: Nov 09, 1955  Age: 65 y.o. MRN: [id number]  CC:  Chief Complaint  Patient presents with  . New Patient (Initial Visit)    New patient just establishing care and needs med refills     HPI Bill Johnson is a 65 year old male with PMH of CAD s/p stent placement, hypertension, COPD, GERD, OA of shoulder, cervical spondylosis, anxiety and chronic knee pain who presents for establishing care. He is a former patient of Dr. [name].  He has been doing well overall.  He is on DAPT and statin for history of CAD and follows up with cardiologist.  His BP is well controlled.  He takes his medications regularly.  He denies any headache, dizziness, chest pain, dyspnea or palpitations.  He takes pantoprazole for GERD.  Denies any dysphagia or odynophagia.  He quit smoking in 06/2019 after stent placement.  He smoked for about 50 years.  He takes Valium 5 mg for anxiety, but requires it once in a day on most of the days.  He denies anhedonia, SI or HI.  He is up to date with COVID and flu vaccine.  Past Medical History:  Diagnosis Date  . Alcohol abuse   . Allergy    seasonal  . Anxiety   . CAD (coronary artery disease)    a. 06/2019 cath - s/p successful PCI of the ostial to mid LAD with IVUS guidance and DES x 2.  . Depression   . Essential hypertension   . GERD (gastroesophageal reflux disease)   . HOH (hard of hearing)   . Hyperlipidemia   . Hypertension   . OA (osteoarthritis) of knee   . Pulmonary nodules    a. seen on coronary CT 05/2019, will need OP f/u.  Marland Kitchen Rotator cuff tear arthropathy of both shoulders    On DISABILITY, non operative case  . Shoulder pain   . Tobacco abuse   . Vertigo     Past Surgical History:  Procedure Laterality Date  . CARDIAC CATHETERIZATION  06/2005   no significant CAD  . COLONOSCOPY WITH PROPOFOL N/A 05/01/2019   Procedure: COLONOSCOPY WITH PROPOFOL;  Surgeon:  Daneil Dolin, MD;  [facility];  Service: Endoscopy;  Laterality: N/A;  7:30am  . COLONOSCOPY WITH PROPOFOL N/A 03/07/2020   Procedure: COLONOSCOPY WITH PROPOFOL;  Surgeon: Daneil Dolin, MD;  [facility];  Service: Endoscopy;  Laterality: N/A;  8:30am  . CORONARY STENT INTERVENTION N/A 07/05/2019   Procedure: CORONARY STENT INTERVENTION;  Surgeon: Martinique, Peter M, MD;  Location: [place] [facility];  Service: Cardiovascular;  Laterality: N/A;  . CORONARY STENT INTERVENTION N/A 07/20/2019   Procedure: CORONARY STENT INTERVENTION;  Surgeon: Jettie Booze, MD;  Location: [place] [facility];  Service: Cardiovascular;  Laterality: N/A;  . ESOPHAGOGASTRODUODENOSCOPY  08/2005   Dr. Henrene Pastor: small hiatal hernia  . HERNIA REPAIR  1983  . INTRAVASCULAR PRESSURE WIRE/FFR STUDY N/A 07/05/2019   Procedure: INTRAVASCULAR PRESSURE WIRE/FFR STUDY;  Surgeon: Martinique, Peter M, MD;  Location: [place] [facility];  Service: Cardiovascular;  Laterality: N/A;  . INTRAVASCULAR ULTRASOUND/IVUS N/A 07/05/2019   Procedure: Intravascular Ultrasound/IVUS;  Surgeon: [place], Peter M, MD;  Location: [place] [facility];  Service: Cardiovascular;  Laterality: N/A;  . LEFT HEART CATH AND CORONARY ANGIOGRAPHY N/A 07/05/2019   Procedure: LEFT HEART CATH AND CORONARY ANGIOGRAPHY;  Surgeon: Martinique, Peter M, MD;  Location: [place] [facility];  Service: Cardiovascular;  Laterality: N/A;  . LEFT HEART CATH AND CORONARY ANGIOGRAPHY N/A 07/20/2019   Procedure: LEFT HEART CATH AND CORONARY ANGIOGRAPHY;  Surgeon: Jettie Booze, MD;  Location: [place] [facility];  Service: Cardiovascular;  Laterality: N/A;  . POLYPECTOMY  05/01/2019   Procedure: POLYPECTOMY;  Surgeon: Daneil Dolin, MD;  [facility];  Service: Endoscopy;;  . POLYPECTOMY  03/07/2020   Procedure: POLYPECTOMY;  Surgeon: Daneil Dolin, MD;  [facility];  Service: Endoscopy;;  . ROTATOR CUFF REPAIR Bilateral   .  TONSILLECTOMY  1977    Family History  Problem Relation Age of Onset  . Heart disease Mother   . Diabetes Mother   . Hypertension Mother   . Heart disease Father   . Hypertension Father   . Stroke Other   . Cancer Other   . Cancer Brother        prostate  . Colon cancer Neg Hx     Social History   Socioeconomic History  . Marital status: Divorced    Spouse name: Not on file  . Number of children: 2  . Years of education: 84  . Highest education level: Not on file  Occupational History  . Occupation: Film/video editor Designer, television/film set)  Tobacco Use  . Smoking status: Current Every Day Smoker    Packs/day: 1.00    Years: 43.00    Pack years: 43.00    Types: Cigarettes    Start date: 04/15/1970  . Smokeless tobacco: Never Used  . Tobacco comment: uses cigara and a pack lasts 4-5 days  Vaping Use  . Vaping Use: Never used  Substance and Sexual Activity  . Alcohol use: Yes    Comment: Drinks 3-4 beers/day   . Drug use: No  . Sexual activity: Yes  Other Topics Concern  . Not on file  Social History Narrative   Lives at home with nephew and niece in-law   Caffeine use: Drinks coffee/soda every day   Social Determinants of Radio broadcast assistant Strain: Not on file  Food Insecurity: Not on file  Transportation Needs: Not on file  Physical Activity: Not on file  Stress: Not on file  Social Connections: Not on file  Intimate Partner Violence: Not on file    ROS Review of Systems  Constitutional: Negative for chills and fever.  HENT: Negative for congestion and sore throat.   Eyes: Negative for pain and discharge.  Respiratory: Negative for cough and shortness of breath.   Cardiovascular: Negative for chest pain and palpitations.  Gastrointestinal: Negative for constipation, diarrhea, nausea and vomiting.  Endocrine: Negative for polydipsia and polyuria.  Genitourinary: Negative for dysuria and hematuria.  Musculoskeletal: Positive for arthralgias, back pain and  neck pain. Negative for neck stiffness.  Skin: Negative for rash.  Neurological: Negative for dizziness, weakness, numbness and headaches.  Psychiatric/Behavioral: Negative for agitation and behavioral problems.    Objective:   Today's Vitals: BP 127/79 (BP Location: Right Arm, Patient Position: Sitting, Cuff Size: Normal)   Pulse 67   Temp 98.1 F (36.7 C) (Oral)   Resp 18   Ht 5\' 4"  (1.626 m)   Wt 172 lb 6.4 oz (78.2 kg)   SpO2 99%   BMI 29.59 kg/m   Physical Exam Vitals reviewed.  Constitutional:      General: He is not in acute distress.    Appearance: He is not diaphoretic.  HENT:  Head: Normocephalic and atraumatic.     Nose: Nose normal.     Mouth/Throat:     Mouth: Mucous membranes are moist.  Eyes:     General: No scleral icterus.    Extraocular Movements: Extraocular movements intact.     Pupils: Pupils are equal, round, and reactive to light.  Cardiovascular:     Rate and Rhythm: Normal rate and regular rhythm.     Pulses: Normal pulses.     Heart sounds: Normal heart sounds. No murmur heard.   Pulmonary:     Breath sounds: Normal breath sounds. No wheezing or rales.  Abdominal:     Palpations: Abdomen is soft.     Tenderness: There is no abdominal tenderness.  Musculoskeletal:     Cervical back: Neck supple. No tenderness.     Right lower leg: No edema.     Left lower leg: No edema.  Skin:    General: Skin is warm.     Findings: No rash.  Neurological:     General: No focal deficit present.     Mental Status: He is alert and oriented to person, place, and time.     Sensory: No sensory deficit.     Motor: No weakness.  Psychiatric:        Mood and Affect: Mood normal.        Behavior: Behavior normal.     Assessment & Plan:   Encounter to establish care Care established Previous chart reviewed History and medications reviewed with the patient  CAD, multiple vessel S/p stent placement in 06/2019 On DAPT and statin Follows up with  Cardiologist  Essential (primary) hypertension BP Readings from Last 1 Encounters:  04/15/20 127/79   Well-controlled with Amlodipine, Atenolol and Imdur Counseled for compliance with the medications Advised DASH diet and moderate exercise/walking, at least 150 mins/week  COPD (chronic obstructive pulmonary disease) ([place]) Well-controlled with Anoro Albuterol PRN.  GERD (gastroesophageal reflux disease) On Pantoprazole 40 mg QD  Tubulovillous adenoma of colon Had multiple polyps in last colonoscopy in 2021, next colonoscopy in 2025  OA (osteoarthritis) of shoulder Follows up with Sports Medicine, gets steroid injections  Other spondylosis, cervical region Follows up with a Spine specialist, gets steroid injections  GAD (generalized anxiety disorder) Well-controlled with Valium, takes 5 mg q12h PRN - although takes it QD most of the days.  Hearing loss Wears hearing aides     Outpatient Encounter Medications as of 04/15/2020  Medication Sig  . acetaminophen (TYLENOL) 500 MG tablet Take 500 mg by mouth as needed for mild pain or headache.   . albuterol (PROVENTIL HFA;VENTOLIN HFA) 108 (90 Base) MCG/ACT inhaler Inhale 2 puffs into the lungs every 6 (six) hours as needed for wheezing or shortness of breath.  Marland Kitchen amLODipine (NORVASC) 5 MG tablet Take 1 tablet (5 mg total) by mouth daily.  Marland Kitchen aspirin 81 MG tablet Take 81 mg by mouth daily.  Marland Kitchen atorvastatin (LIPITOR) 80 MG tablet Take 1 tablet (80 mg total) by mouth every evening.  . CVS VITAMIN B-12 2000 MCG TBCR Take 2,000 mcg by mouth daily.  . diazepam (VALIUM) 5 MG tablet Take 1 tablet (5 mg total) by mouth every 12 (twelve) hours as needed for anxiety.  . isosorbide mononitrate (IMDUR) 30 MG 24 hr tablet Take 1 tablet (30 mg total) by mouth daily.  . meclizine (ANTIVERT) 25 MG tablet TAKE 1 TABLET BY MOUTH THREE TIMES DAILY AS NEEDED FOR DIZZINESS (Patient taking differently: Take 25 mg by  mouth 3 (three) times daily as needed  for dizziness.)  . nitroGLYCERIN (NITROSTAT) 0.4 MG SL tablet Place 1 tablet (0.4 mg total) under the tongue every 5 (five) minutes as needed for chest pain (up to 3 doses).  . pantoprazole (PROTONIX) 40 MG tablet Take 1 tablet (40 mg total) by mouth 2 (two) times daily.  Marland Kitchen umeclidinium-vilanterol (ANORO ELLIPTA) 62.5-25 MCG/INH AEPB Inhale 1 puff into the lungs daily.  . [DISCONTINUED] atenolol (TENORMIN) 50 MG tablet Take 1 tablet (50 mg total) by mouth daily.  . [DISCONTINUED] clopidogrel (PLAVIX) 75 MG tablet Take 1 tablet (75 mg total) by mouth daily.  Marland Kitchen atenolol (TENORMIN) 50 MG tablet Take 1 tablet (50 mg total) by mouth daily.  . clopidogrel (PLAVIX) 75 MG tablet Take 1 tablet (75 mg total) by mouth daily.   No facility-administered encounter medications on file as of 04/15/2020.    Follow-up: Return in about 4 months (around 08/15/2020) for HTN and CAD.   Lindell Spar, MD

## 2020-04-15 NOTE — Assessment & Plan Note (Signed)
BP Readings from Last 1 Encounters:  04/15/20 127/79   Well-controlled with Amlodipine, Atenolol and Imdur Counseled for compliance with the medications Advised DASH diet and moderate exercise/walking, at least 150 mins/week

## 2020-04-15 NOTE — Assessment & Plan Note (Signed)
Well-controlled with Anoro Albuterol PRN.

## 2020-04-15 NOTE — Patient Instructions (Signed)
Continue taking medications as prescribed.  Please ask your Cardiologist about Plavix and Aspirin in the next visit.  Continue to follow low salt diet and perform exercise/walking as tolerated.  Please get fasting blood tests done before the next visit.

## 2020-04-15 NOTE — Assessment & Plan Note (Signed)
Care established Previous chart reviewed History and medications reviewed with the patient 

## 2020-04-15 NOTE — Assessment & Plan Note (Signed)
On Pantoprazole 40 mg QD

## 2020-04-15 NOTE — Assessment & Plan Note (Signed)
Had multiple polyps in last colonoscopy in 2021, next colonoscopy in 2025

## 2020-04-15 NOTE — Assessment & Plan Note (Signed)
Follows up with Sports Medicine, gets steroid injections

## 2020-04-23 ENCOUNTER — Other Ambulatory Visit: Payer: Self-pay

## 2020-04-23 ENCOUNTER — Inpatient Hospital Stay (HOSPITAL_COMMUNITY)
Admission: EM | Admit: 2020-04-23 | Discharge: 2020-04-25 | DRG: 287 | Disposition: A | Discharge status: Home / Self Care | Payer: Medicare Other | Attending: Cardiology | Admitting: Cardiology

## 2020-04-23 ENCOUNTER — Encounter (HOSPITAL_COMMUNITY): Payer: Self-pay | Admitting: *Deleted

## 2020-04-23 ENCOUNTER — Emergency Department (HOSPITAL_COMMUNITY): Payer: Medicare Other

## 2020-04-23 DIAGNOSIS — Z955 Presence of coronary angioplasty implant and graft: Secondary | ICD-10-CM

## 2020-04-23 DIAGNOSIS — E782 Mixed hyperlipidemia: Secondary | ICD-10-CM | POA: Diagnosis present

## 2020-04-23 DIAGNOSIS — J449 Chronic obstructive pulmonary disease, unspecified: Secondary | ICD-10-CM | POA: Diagnosis present

## 2020-04-23 DIAGNOSIS — I200 Unstable angina: Secondary | ICD-10-CM | POA: Diagnosis not present

## 2020-04-23 DIAGNOSIS — Z20822 Contact with and (suspected) exposure to covid-19: Secondary | ICD-10-CM | POA: Diagnosis present

## 2020-04-23 DIAGNOSIS — Z8042 Family history of malignant neoplasm of prostate: Secondary | ICD-10-CM

## 2020-04-23 DIAGNOSIS — Z7902 Long term (current) use of antithrombotics/antiplatelets: Secondary | ICD-10-CM | POA: Diagnosis not present

## 2020-04-23 DIAGNOSIS — I209 Angina pectoris, unspecified: Secondary | ICD-10-CM | POA: Diagnosis not present

## 2020-04-23 DIAGNOSIS — H9193 Unspecified hearing loss, bilateral: Secondary | ICD-10-CM | POA: Diagnosis not present

## 2020-04-23 DIAGNOSIS — F411 Generalized anxiety disorder: Secondary | ICD-10-CM | POA: Diagnosis present

## 2020-04-23 DIAGNOSIS — J439 Emphysema, unspecified: Secondary | ICD-10-CM | POA: Diagnosis not present

## 2020-04-23 DIAGNOSIS — D126 Benign neoplasm of colon, unspecified: Secondary | ICD-10-CM | POA: Diagnosis not present

## 2020-04-23 DIAGNOSIS — M19019 Primary osteoarthritis, unspecified shoulder: Secondary | ICD-10-CM | POA: Diagnosis present

## 2020-04-23 DIAGNOSIS — Z823 Family history of stroke: Secondary | ICD-10-CM | POA: Diagnosis not present

## 2020-04-23 DIAGNOSIS — F1010 Alcohol abuse, uncomplicated: Secondary | ICD-10-CM | POA: Diagnosis present

## 2020-04-23 DIAGNOSIS — Z8249 Family history of ischemic heart disease and other diseases of the circulatory system: Secondary | ICD-10-CM

## 2020-04-23 DIAGNOSIS — F17210 Nicotine dependence, cigarettes, uncomplicated: Secondary | ICD-10-CM | POA: Diagnosis not present

## 2020-04-23 DIAGNOSIS — Z79899 Other long term (current) drug therapy: Secondary | ICD-10-CM

## 2020-04-23 DIAGNOSIS — Z833 Family history of diabetes mellitus: Secondary | ICD-10-CM

## 2020-04-23 DIAGNOSIS — I25110 Atherosclerotic heart disease of native coronary artery with unstable angina pectoris: Secondary | ICD-10-CM | POA: Diagnosis not present

## 2020-04-23 DIAGNOSIS — Z7982 Long term (current) use of aspirin: Secondary | ICD-10-CM

## 2020-04-23 DIAGNOSIS — K2100 Gastro-esophageal reflux disease with esophagitis, without bleeding: Secondary | ICD-10-CM | POA: Diagnosis present

## 2020-04-23 DIAGNOSIS — M47812 Spondylosis without myelopathy or radiculopathy, cervical region: Secondary | ICD-10-CM | POA: Diagnosis present

## 2020-04-23 DIAGNOSIS — I10 Essential (primary) hypertension: Secondary | ICD-10-CM | POA: Diagnosis not present

## 2020-04-23 DIAGNOSIS — R7303 Prediabetes: Secondary | ICD-10-CM | POA: Diagnosis present

## 2020-04-23 DIAGNOSIS — R079 Chest pain, unspecified: Secondary | ICD-10-CM | POA: Diagnosis not present

## 2020-04-23 DIAGNOSIS — I2510 Atherosclerotic heart disease of native coronary artery without angina pectoris: Secondary | ICD-10-CM | POA: Diagnosis not present

## 2020-04-23 DIAGNOSIS — Z720 Tobacco use: Secondary | ICD-10-CM | POA: Diagnosis present

## 2020-04-23 DIAGNOSIS — E785 Hyperlipidemia, unspecified: Secondary | ICD-10-CM | POA: Diagnosis present

## 2020-04-23 LAB — BASIC METABOLIC PANEL
Anion gap: 9 (ref 5–15)
BUN: 14 mg/dL (ref 8–23)
CO2: 23 mmol/L (ref 22–32)
Calcium: 8.3 mg/dL — ABNORMAL LOW (ref 8.9–10.3)
Chloride: 105 mmol/L (ref 98–111)
Creatinine, Ser: 0.78 mg/dL (ref 0.61–1.24)
GFR, Estimated: >60 mL/min (ref >60)
Glucose, Bld: 94 mg/dL (ref 70–99)
Potassium: 3.7 mmol/L (ref 3.5–5.1)
Sodium: 137 mmol/L (ref 135–145)

## 2020-04-23 LAB — CBC
HCT: 41.7 % (ref 39.0–52.0)
Hemoglobin: 13.8 g/dL (ref 13.0–17.0)
MCH: 33.6 pg (ref 26.0–34.0)
MCHC: 33.1 g/dL (ref 30.0–36.0)
MCV: 101.5 fL — ABNORMAL HIGH (ref 80.0–100.0)
Platelets: 178 10*3/uL (ref 150–400)
RBC: 4.11 MIL/uL — ABNORMAL LOW (ref 4.22–5.81)
RDW: 13 % (ref 11.5–15.5)
WBC: 6.8 10*3/uL (ref 4.0–10.5)
nRBC: 0 % (ref 0.0–0.2)

## 2020-04-23 LAB — TROPONIN I (HIGH SENSITIVITY)
Troponin I (High Sensitivity): 9 ng/L (ref <18)
Troponin I (High Sensitivity): 9 ng/L (ref <18)

## 2020-04-23 LAB — RESP PANEL BY RT-PCR (FLU A&B, COVID) ARPGX2
Influenza A by PCR: NEGATIVE
Influenza B by PCR: NEGATIVE
SARS Coronavirus 2 by RT PCR: NEGATIVE

## 2020-04-23 MED ORDER — NITROGLYCERIN 0.4 MG SL SUBL
0.4000 mg | SUBLINGUAL_TABLET | Freq: Once | SUBLINGUAL | Status: AC
  Administered 2020-04-23: 0.4 mg via SUBLINGUAL
  Filled 2020-04-23: qty 1

## 2020-04-23 NOTE — ED Notes (Signed)
Pt's niece updated per pt's request

## 2020-04-23 NOTE — ED Notes (Signed)
ED Provider at bedside. 

## 2020-04-23 NOTE — ED Notes (Signed)
Patient transported to X-ray 

## 2020-04-23 NOTE — H&P (Signed)
Cardiology History & Physical    Patient ID: ROMY MCGUE MRN: [id number], DOB/AGE: 66-Jan-1957   Admit date: 04/23/2020  Primary Physician: Lindell Spar, MD Primary Cardiologist: Kate Sable, MD (Inactive)  Patient Profile    Bill Johnson is a 65 year old male with a history of HTN, GERD with esophagitis, COPD, OA, cervical spondylosis, anxiety, tobacco and alcohol abuse, CAD s/p DESx2 to o-mLAD 07/05/19 for CP and +CCTA, then DES to 95% OM1 stenosis 07/20/19 for residual symptoms. EF preserved. Trop has never been elevated. He presented to the [facility] ED today for non-exertional chest pain with negative troponins and has been transferred here for further evaluation.  History of Present Illness    He reports doing well overall since last June following his second PCI. He has had some residual angina at times with activities such as using a push mower or walking longer distances, but resolves quickly with rest and has been stable. He is compliant with medications, which include Plavix, ASA, Imdur 30, atenolol 50, Norvasc 5. Not smoking.   Yesterday morning at 7:30 AM he woke up with chest pain that was continuous and persisted throughout the morning, seemed to worsen periodically. Unclear if exertional because he was trying to rest. No radiation or associated nausea, dyspnea, or diaphoresis. He tried taking a nitro around 10:30 without relief, but notes they were expired. He was given nitro en route with improvement in his pain. Worsened again and again relieved by nitro in the ED. Initial vitals normal, no ECG changes, troponin 9->9. Accepted for transfer for further evaluation. Remains chest pain free.  Past Medical History   Past Medical History:  Diagnosis Date  . Alcohol abuse   . Allergy    seasonal  . Anxiety   . CAD (coronary artery disease)    a. 06/2019 cath - s/p successful PCI of the ostial to mid LAD with IVUS guidance and DES x 2.  . Depression   .  Essential hypertension   . GERD (gastroesophageal reflux disease)   . HOH (hard of hearing)   . Hyperlipidemia   . Hypertension   . OA (osteoarthritis) of knee   . Pulmonary nodules    a. seen on coronary CT 05/2019, will need OP f/u.  Marland Kitchen Rotator cuff tear arthropathy of both shoulders    On DISABILITY, non operative case  . Shoulder pain   . Tobacco abuse   . Vertigo     Past Surgical History:  Procedure Laterality Date  . CARDIAC CATHETERIZATION  06/2005   no significant CAD  . COLONOSCOPY WITH PROPOFOL N/A 05/01/2019   Procedure: COLONOSCOPY WITH PROPOFOL;  Surgeon: Daneil Dolin, MD;  [facility];  Service: Endoscopy;  Laterality: N/A;  7:30am  . COLONOSCOPY WITH PROPOFOL N/A 03/07/2020   Procedure: COLONOSCOPY WITH PROPOFOL;  Surgeon: Daneil Dolin, MD;  [facility];  Service: Endoscopy;  Laterality: N/A;  8:30am  . CORONARY STENT INTERVENTION N/A 07/05/2019   Procedure: CORONARY STENT INTERVENTION;  Surgeon: Martinique, Peter M, MD;  Location: [place] [facility];  Service: Cardiovascular;  Laterality: N/A;  . CORONARY STENT INTERVENTION N/A 07/20/2019   Procedure: CORONARY STENT INTERVENTION;  Surgeon: Jettie Booze, MD;  Location: [place] [facility];  Service: Cardiovascular;  Laterality: N/A;  . ESOPHAGOGASTRODUODENOSCOPY  08/2005   Dr. Henrene Pastor: small hiatal hernia  . HERNIA REPAIR  1983  . INTRAVASCULAR PRESSURE WIRE/FFR STUDY N/A 07/05/2019   Procedure: INTRAVASCULAR PRESSURE  WIRE/FFR STUDY;  Surgeon: Martinique, Peter M, MD;  Location: [place] [facility];  Service: Cardiovascular;  Laterality: N/A;  . INTRAVASCULAR ULTRASOUND/IVUS N/A 07/05/2019   Procedure: Intravascular Ultrasound/IVUS;  Surgeon: Martinique, Peter M, MD;  Location: [place] [facility];  Service: Cardiovascular;  Laterality: N/A;  . LEFT HEART CATH AND CORONARY ANGIOGRAPHY N/A 07/05/2019   Procedure: LEFT HEART CATH AND CORONARY ANGIOGRAPHY;  Surgeon: Martinique, Peter M, MD;  Location: [place] [facility];  Service: Cardiovascular;  Laterality: N/A;  . LEFT HEART CATH AND CORONARY ANGIOGRAPHY N/A 07/20/2019   Procedure: LEFT HEART CATH AND CORONARY ANGIOGRAPHY;  Surgeon: Jettie Booze, MD;  Location: [place] [facility];  Service: Cardiovascular;  Laterality: N/A;  . POLYPECTOMY  05/01/2019   Procedure: POLYPECTOMY;  Surgeon: Daneil Dolin, MD;  [facility];  Service: Endoscopy;;  . POLYPECTOMY  03/07/2020   Procedure: POLYPECTOMY;  Surgeon: Daneil Dolin, MD;  [facility];  Service: Endoscopy;;  . ROTATOR CUFF REPAIR Bilateral   . TONSILLECTOMY  1977     Allergies No Known Allergies  Home Medications    Prior to Admission medications   Medication Sig Start Date End Date Taking? Authorizing Provider  acetaminophen (TYLENOL) 500 MG tablet Take 500 mg by mouth as needed for mild pain or headache.    Yes [provider]  albuterol (PROVENTIL HFA;VENTOLIN HFA) 108 (90 Base) MCG/ACT inhaler Inhale 2 puffs into the lungs every 6 (six) hours as needed for wheezing or shortness of breath. 10/13/17  Yes Dena Billet B, PA-C  amLODipine (NORVASC) 5 MG tablet Take 1 tablet (5 mg total) by mouth daily. 05/03/19 05/02/20 Yes [place], Modena Nunnery, MD  aspirin 81 MG tablet Take 81 mg by mouth daily.   Yes [provider]  atenolol (TENORMIN) 50 MG tablet Take 1 tablet (50 mg total) by mouth daily. 04/15/20  Yes Lindell Spar, MD  atorvastatin (LIPITOR) 80 MG tablet Take 1 tablet (80 mg total) by mouth every evening. 12/01/19  Yes [place], Modena Nunnery, MD  cetirizine (ZYRTEC) 10 MG tablet Take 10 mg by mouth daily.   Yes [provider]  clopidogrel (PLAVIX) 75 MG tablet Take 1 tablet (75 mg total) by mouth daily. 04/15/20  Yes Lindell Spar, MD  CVS VITAMIN B-12 2000 MCG TBCR Take 2,000 mcg by mouth daily.   Yes [provider]  diazepam (VALIUM) 5 MG tablet Take 1 tablet (5 mg total) by mouth every 12 (twelve) hours as needed for anxiety. 02/15/20   Yes [place], Modena Nunnery, MD  isosorbide mononitrate (IMDUR) 30 MG 24 hr tablet Take 1 tablet (30 mg total) by mouth daily. 01/24/20  Yes Verta Ellen., NP  meclizine (ANTIVERT) 25 MG tablet TAKE 1 TABLET BY MOUTH THREE TIMES DAILY AS NEEDED FOR DIZZINESS Patient taking differently: Take 25 mg by mouth 3 (three) times daily as needed for dizziness. 02/15/20  Yes [place], Modena Nunnery, MD  nitroGLYCERIN (NITROSTAT) 0.4 MG SL tablet Place 1 tablet (0.4 mg total) under the tongue every 5 (five) minutes as needed for chest pain (up to 3 doses). 07/06/19 07/05/20 Yes [place], Dayna N, PA-C  pantoprazole (PROTONIX) 40 MG tablet Take 1 tablet (40 mg total) by mouth 2 (two) times daily. 02/15/20  Yes [place], Modena Nunnery, MD  umeclidinium-vilanterol [facility] ELLIPTA) 62.5-25 MCG/INH AEPB Inhale 1 puff into the lungs daily. 09/28/19  Yes [place], Modena Nunnery, MD    Family History  Family History  Problem Relation Age of Onset  . Heart disease Mother   . Diabetes Mother   . Hypertension Mother   . Heart disease Father   . Hypertension Father   . Stroke Other   . Cancer Other   . Cancer Brother        prostate  . Colon cancer Neg Hx    He indicated that his mother is alive. He indicated that his father is alive. He indicated that the status of his brother is unknown. He indicated that the status of his neg hx is unknown. He indicated that the status of his other is unknown.   Social History    Social History   Socioeconomic History  . Marital status: Divorced    Spouse name: Not on file  . Number of children: 2  . Years of education: 60  . Highest education level: Not on file  Occupational History  . Occupation: Film/video editor Designer, television/film set)  Tobacco Use  . Smoking status: Current Every Day Smoker    Packs/day: 1.00    Years: 43.00    Pack years: 43.00    Types: Cigarettes    Start date: 04/15/1970  . Smokeless tobacco: Never Used  . Tobacco comment: uses cigara and a pack lasts 4-5 days  Vaping  Use  . Vaping Use: Never used  Substance and Sexual Activity  . Alcohol use: Yes    Comment: Drinks 3-4 beers/day   . Drug use: No  . Sexual activity: Yes  Other Topics Concern  . Not on file  Social History Narrative   Lives at home with nephew and niece in-law   Caffeine use: Drinks coffee/soda every day   Social Determinants of Health   Financial Resource Strain: Not on file  Food Insecurity: Not on file  Transportation Needs: Not on file  Physical Activity: Not on file  Stress: Not on file  Social Connections: Not on file  Intimate Partner Violence: Not on file     Review of Systems    A comprehensive review of systems was performed with pertinent positives and negatives noted in the HPI.  Physical Exam    BP (!) 142/82 (BP Location: Left Arm)   Pulse 65   Temp 97.6 F (36.4 C) (Oral)   Resp 17   Ht 5\' 4"  (1.626 m)   Wt 76 kg   SpO2 99%   BMI 28.75 kg/m  General: Alert, NAD HEENT: Normal  Neck: No bruits or JVD. Lungs:  Resp regular and unlabored, CTA bilaterally. Heart: Regular rhythm, no s3, s4, or murmurs. Abdomen: Soft, non-tender, non-distended, BS +.  Extremities: Warm. No clubbing, cyanosis or edema. DP/PT/Radials 2+ and equal bilaterally. Psych: Normal affect. Neuro: Alert and oriented. No gross focal deficits. No abnormal movements.  Labs    Cardiac Panel (last 3 results) Recent Labs    04/23/20 1511 04/23/20 1631  TROPONINIHS 9 9   Lab Results  Component Value Date   WBC 6.8 04/23/2020   HGB 13.8 04/23/2020   HCT 41.7 04/23/2020   MCV 101.5 (H) 04/23/2020   PLT 178 04/23/2020    Recent Labs  Lab 04/23/20 1511  NA 137  K 3.7  CL 105  CO2 23  BUN 14  CREATININE 0.78  CALCIUM 8.3*  GLUCOSE 94   Lab Results  Component Value Date   CHOL 149 09/01/2019   HDL 59 09/01/2019   LDLCALC 71 09/01/2019   TRIG 104 09/01/2019     Radiology  Studies    DG Chest 2 View  Result Date: 04/23/2020 CLINICAL DATA:  Chest pain, coronary  artery disease EXAM: CHEST - 2 VIEW COMPARISON:  07/19/2019 FINDINGS: Frontal and lateral views of the chest demonstrate a stable cardiac silhouette. Background emphysema unchanged, without superimposed airspace disease, effusion, or pneumothorax. There are no acute bony abnormalities. IMPRESSION: 1. Emphysema.  No acute process. Electronically Signed   By: Randa Ngo M.D.   On: 04/23/2020 15:06    ECG & Cardiac Imaging    ECG is normal with NSR - personally reviewed.  Prior echocardiogram and coronary angiograms reviewed.  Assessment & Plan    CAD with unstable angina: prior PCI of LAD and OM1 for anginal symptoms, LVEF preserved. Describes very typical stable angina that has been medically managed, though now presents with acute onset chest pain at rest requiring multiple ntg to resolve. Troponin negative x2. No ECG changes. Currently chest pain free. The patient's TIMI risk score is 5, which indicates a 26% risk of all cause mortality, new or recurrent myocardial infarction or need for urgent revascularization in the next 14 days. Further inpatient evaluation is warranted. Given 2 of 3 stents are < 18mm, CCTA imaging may be limited for evaluation of ISR and he may be better served with invasive angiography.  - Will plan for coronary angiography in the morning (invasive vs CCTA, as above). We can discuss feasibility of adequately imaging his 2.55mm stents with our imaging colleagues in the morning. He is NPO. - Will hold off on TTE for now. - Hold Plavix pending angiography. Continue aspirin. - Continue atorvastatin 80mg  and update lipids/A1c. Last LDL above goal. Can consider transition to rosuvastatin and addition of Zetia if needed.  - Continue Imdur, Norvasc, atenolol   HTN: controlled. - Continue home meds, as above.  COPD: stable, no wheezing - Anoro Ellipta - albuterol MDI prn  Nutrition: NPO DVT ppx: hold for possible cath GI ppx: home PPI Advanced Care Planning: Full  Code  Signed, Marykay Lex, MD 04/24/2020, 3:05 AM

## 2020-04-23 NOTE — ED Triage Notes (Signed)
Chest pain onset 10am today

## 2020-04-23 NOTE — Plan of Care (Signed)

## 2020-04-23 NOTE — ED Provider Notes (Signed)
[facility] EMERGENCY DEPARTMENT Provider Note   CSN: [id number] Arrival date & time: 04/23/20  1400     History Chief Complaint  Patient presents with  . Chest Pain    Bill Johnson is a 65 y.o. male who presents with chest pain that he woke up with this morning around 7:30 in the morning.  He states he took 1 sublingual nitro around 1030 this morning without relief in his pain.  Endorses pain is intermittent, with onset and offset every 3 to 5 minutes.  Pain is sharp and left side in his chest without radiation into his neck, jaw, or arm.  He denies any nausea, shortness of breath, or palpitations.  He states that he presents out of concern for chest pain given history of multiple stent placement in June of last year after episode of chest pain.  Left heart cath in June of 2021 revealed normal left ventricular systolic function with LVEF 55-65%.    Denies any recent prolonged travel or immobilization, denies recent surgical procedures, history of malignancy or blood clot in the past, denies any hormone replacement therapy.  I personally viewed the patient's medical records.  He has history of coronary artery disease on Plavix, Lipitor, and aspirin.  Patient with history of hypertension as well, on antihypertensive medications.  Additionally is history of GERD on pantoprazole and COPD on inhalers.  HPI  HPI: A 65 year old patient with a history of hypertension and hypercholesterolemia presents for evaluation of chest pain. Initial onset of pain was less than one hour ago. The patient's chest pain is described as heaviness/pressure/tightness, is sharp, is not worse with exertion and is relieved by nitroglycerin. The patient's chest pain is middle- or left-sided, is not well-localized and does not radiate to the arms/jaw/neck. The patient does not complain of nausea and denies diaphoresis. The patient has smoked in the past 90 days and has a family history of coronary artery disease in a  first-degree relative with onset less than age 66. The patient has no history of stroke, has no history of peripheral artery disease, denies any history of treated diabetes and does not have an elevated BMI (>=30).   Past Medical History:  Diagnosis Date  . Alcohol abuse   . Allergy    seasonal  . Anxiety   . CAD (coronary artery disease)    a. 06/2019 cath - s/p successful PCI of the ostial to mid LAD with IVUS guidance and DES x 2.  . Depression   . Essential hypertension   . GERD (gastroesophageal reflux disease)   . HOH (hard of hearing)   . Hyperlipidemia   . Hypertension   . OA (osteoarthritis) of knee   . Pulmonary nodules    a. seen on coronary CT 05/2019, will need OP f/u.  Marland Kitchen Rotator cuff tear arthropathy of both shoulders    On DISABILITY, non operative case  . Shoulder pain   . Tobacco abuse   . Vertigo     Patient Active Problem List   Diagnosis Date Noted  . Unstable angina ([place]) 04/23/2020  . Encounter to establish care 04/15/2020  . Tubulovillous adenoma of colon 01/11/2020  . Status post coronary artery stent placement 01/04/2020  . Tubulovillous adenoma of rectum 10/26/2019  . CAD, multiple vessel 07/06/2019  . Alcohol abuse 03/27/2019  . Angina pectoris ([employer]) 03/19/2019  . Other spondylosis, cervical region 09/06/2018  . Hyperlipemia 07/26/2018  . GERD (gastroesophageal reflux disease) 06/24/2018  . OA (osteoarthritis) of shoulder 06/28/2017  .  B12 deficiency 07/01/2016  . Essential (primary) hypertension 07/01/2016  . COPD (chronic obstructive pulmonary disease) ([place]) 07/01/2016  . Tobacco use 07/01/2016  . GAD (generalized anxiety disorder) 07/01/2016  . Vertigo 11/28/2014  . Hearing loss 11/28/2014  . Tremor 11/28/2014    Past Surgical History:  Procedure Laterality Date  . CARDIAC CATHETERIZATION  06/2005   no significant CAD  . COLONOSCOPY WITH PROPOFOL N/A 05/01/2019   Procedure: COLONOSCOPY WITH PROPOFOL;  Surgeon: Daneil Dolin, MD;   [facility];  Service: Endoscopy;  Laterality: N/A;  7:30am  . COLONOSCOPY WITH PROPOFOL N/A 03/07/2020   Procedure: COLONOSCOPY WITH PROPOFOL;  Surgeon: Daneil Dolin, MD;  [facility];  Service: Endoscopy;  Laterality: N/A;  8:30am  . CORONARY STENT INTERVENTION N/A 07/05/2019   Procedure: CORONARY STENT INTERVENTION;  Surgeon: Martinique, Peter M, MD;  Location: [place] [facility] LAB;  Service: Cardiovascular;  Laterality: N/A;  . CORONARY STENT INTERVENTION N/A 07/20/2019   Procedure: CORONARY STENT INTERVENTION;  Surgeon: Jettie Booze, MD;  Location: [place] CV LAB;  Service: Cardiovascular;  Laterality: N/A;  . ESOPHAGOGASTRODUODENOSCOPY  08/2005   Dr. Henrene Pastor: small hiatal hernia  . HERNIA REPAIR  1983  . INTRAVASCULAR PRESSURE WIRE/FFR STUDY N/A 07/05/2019   Procedure: INTRAVASCULAR PRESSURE WIRE/FFR STUDY;  Surgeon: Martinique, Peter M, MD;  Location: [place] [facility] LAB;  Service: Cardiovascular;  Laterality: N/A;  . INTRAVASCULAR ULTRASOUND/IVUS N/A 07/05/2019   Procedure: Intravascular Ultrasound/IVUS;  Surgeon: Martinique, Peter M, MD;  Location: [place] [facility] LAB;  Service: Cardiovascular;  Laterality: N/A;  . LEFT HEART CATH AND CORONARY ANGIOGRAPHY N/A 07/05/2019   Procedure: LEFT HEART CATH AND CORONARY ANGIOGRAPHY;  Surgeon: Martinique, Peter M, MD;  Location: [place] [facility] LAB;  Service: Cardiovascular;  Laterality: N/A;  . LEFT HEART CATH AND CORONARY ANGIOGRAPHY N/A 07/20/2019   Procedure: LEFT HEART CATH AND CORONARY ANGIOGRAPHY;  Surgeon: Jettie Booze, MD;  Location: [place] CV LAB;  Service: Cardiovascular;  Laterality: N/A;  . POLYPECTOMY  05/01/2019   Procedure: POLYPECTOMY;  Surgeon: Daneil Dolin, MD;  [facility];  Service: Endoscopy;;  . POLYPECTOMY  03/07/2020   Procedure: POLYPECTOMY;  Surgeon: Daneil Dolin, MD;  [facility];  Service: Endoscopy;;  . ROTATOR CUFF REPAIR Bilateral   . TONSILLECTOMY  1977        Family History  Problem Relation Age of Onset  . Heart disease Mother   . Diabetes Mother   . Hypertension Mother   . Heart disease Father   . Hypertension Father   . Stroke Other   . Cancer Other   . Cancer Brother        prostate  . Colon cancer Neg Hx     Social History   Tobacco Use  . Smoking status: Current Every Day Smoker    Packs/day: 1.00    Years: 43.00    Pack years: 43.00    Types: Cigarettes    Start date: 04/15/1970  . Smokeless tobacco: Never Used  . Tobacco comment: uses cigara and a pack lasts 4-5 days  Vaping Use  . Vaping Use: Never used  Substance Use Topics  . Alcohol use: Yes    Comment: Drinks 3-4 beers/day   . Drug use: No    Home Medications Prior to Admission medications   Medication Sig Start Date End Date Taking? Authorizing Provider  acetaminophen (TYLENOL) 500 MG tablet Take 500 mg by mouth as needed  for mild pain or headache.    Yes [provider]  albuterol (PROVENTIL HFA;VENTOLIN HFA) 108 (90 Base) MCG/ACT inhaler Inhale 2 puffs into the lungs every 6 (six) hours as needed for wheezing or shortness of breath. 10/13/17  Yes Dena Billet B, PA-C  amLODipine (NORVASC) 5 MG tablet Take 1 tablet (5 mg total) by mouth daily. 05/03/19 05/02/20 Yes Winger, Modena Nunnery, MD  aspirin 81 MG tablet Take 81 mg by mouth daily.   Yes [provider]  atenolol (TENORMIN) 50 MG tablet Take 1 tablet (50 mg total) by mouth daily. 04/15/20  Yes Lindell Spar, MD  atorvastatin (LIPITOR) 80 MG tablet Take 1 tablet (80 mg total) by mouth every evening. 12/01/19  Yes [place], Modena Nunnery, MD  cetirizine (ZYRTEC) 10 MG tablet Take 10 mg by mouth daily.   Yes [provider]  clopidogrel (PLAVIX) 75 MG tablet Take 1 tablet (75 mg total) by mouth daily. 04/15/20  Yes Lindell Spar, MD  [employer] VITAMIN B-12 2000 MCG TBCR Take 2,000 mcg by mouth daily.   Yes [provider]  diazepam (VALIUM) 5 MG tablet Take 1 tablet (5 mg total) by  mouth every 12 (twelve) hours as needed for anxiety. 02/15/20  Yes Martin, Modena Nunnery, MD  isosorbide mononitrate (IMDUR) 30 MG 24 hr tablet Take 1 tablet (30 mg total) by mouth daily. 01/24/20  Yes Verta Ellen., NP  meclizine (ANTIVERT) 25 MG tablet TAKE 1 TABLET BY MOUTH THREE TIMES DAILY AS NEEDED FOR DIZZINESS Patient taking differently: Take 25 mg by mouth 3 (three) times daily as needed for dizziness. 02/15/20  Yes [place], Modena Nunnery, MD  nitroGLYCERIN (NITROSTAT) 0.4 MG SL tablet Place 1 tablet (0.4 mg total) under the tongue every 5 (five) minutes as needed for chest pain (up to 3 doses). 07/06/19 07/05/20 Yes Dunn, Dayna N, PA-C  pantoprazole (PROTONIX) 40 MG tablet Take 1 tablet (40 mg total) by mouth 2 (two) times daily. 02/15/20  Yes [place], Modena Nunnery, MD  umeclidinium-vilanterol [facility] ELLIPTA) 62.5-25 MCG/INH AEPB Inhale 1 puff into the lungs daily. 09/28/19  Yes , Modena Nunnery, MD    Allergies    Patient has no known allergies.  Review of Systems   Review of Systems  Constitutional: Negative.   HENT: Negative.   Eyes: Negative.   Respiratory: Negative.   Cardiovascular: Positive for chest pain. Negative for palpitations and leg swelling.  Gastrointestinal: Negative.   Genitourinary: Negative.   Musculoskeletal: Negative.   Neurological: Negative.     Physical Exam Updated Vital Signs BP 138/83   Pulse 62   Temp 98.1 F (36.7 C) (Oral)   Resp 17   Ht 5\' 4"  (1.626 m)   Wt 78 kg   SpO2 98%   BMI 29.52 kg/m   Physical Exam Vitals and nursing note reviewed.  Constitutional:      Appearance: He is not ill-appearing or toxic-appearing.  HENT:     Head: Normocephalic and atraumatic.     Nose: Nose normal.     Mouth/Throat:     Mouth: Mucous membranes are moist.     Pharynx: Oropharynx is clear. Uvula midline. No oropharyngeal exudate or posterior oropharyngeal erythema.     Tonsils: No tonsillar exudate.  Eyes:     General: Lids are normal. Vision grossly  intact.        Right eye: No discharge.        Left eye: No discharge.  Extraocular Movements: Extraocular movements intact.     Conjunctiva/sclera: Conjunctivae normal.     Pupils: Pupils are equal, round, and reactive to light.  Neck:     Vascular: No JVD.     Trachea: Trachea and phonation normal.  Cardiovascular:     Rate and Rhythm: Normal rate and regular rhythm.     Pulses: Normal pulses.     Heart sounds: Normal heart sounds. No murmur heard.   Pulmonary:     Effort: Pulmonary effort is normal. No tachypnea or respiratory distress.     Breath sounds: Normal breath sounds. No wheezing or rales.  Chest:     Chest wall: No mass, tenderness, crepitus or edema.  Abdominal:     General: Bowel sounds are normal. There is no distension.     Palpations: Abdomen is soft.     Tenderness: There is no abdominal tenderness.  Musculoskeletal:        General: No deformity.     Cervical back: Normal range of motion and neck supple. No crepitus. No pain with movement, spinous process tenderness or muscular tenderness.     Right lower leg: No tenderness. No edema.     Left lower leg: No tenderness. No edema.  Lymphadenopathy:     Cervical: No cervical adenopathy.  Skin:    General: Skin is warm and dry.     Capillary Refill: Capillary refill takes less than 2 seconds.  Neurological:     General: No focal deficit present.     Mental Status: He is alert and oriented to person, place, and time. Mental status is at baseline.     Cranial Nerves: Cranial nerves are intact.     Sensory: Sensation is intact.     Motor: Motor function is intact.     Gait: Gait is intact.  Psychiatric:        Mood and Affect: Mood normal.     ED Results / Procedures / Treatments   Labs (all labs ordered are listed, but only abnormal results are displayed) Labs Reviewed  BASIC METABOLIC PANEL - Abnormal; Notable for the following components:      Result Value   Calcium 8.3 (*)    All other  components within normal limits  CBC - Abnormal; Notable for the following components:   RBC 4.11 (*)    MCV 101.5 (*)    All other components within normal limits  RESP PANEL BY RT-PCR (FLU A&B, COVID) ARPGX2  TROPONIN I (HIGH SENSITIVITY)  TROPONIN I (HIGH SENSITIVITY)    EKG EKG Interpretation  Date/Time:  Tuesday April 23 2020 14:16:16 EDT Ventricular Rate:  81 PR Interval:  158 QRS Duration: 92 QT Interval:  400 QTC Calculation: 464 R Axis:   68 Text Interpretation: Normal sinus rhythm Normal ECG No significant change since last tracing Confirmed by Davonna Belling [phone]) on 04/23/2020 2:31:04 PM  Radiology DG Chest 2 View  Result Date: 04/23/2020 CLINICAL DATA:  Chest pain, coronary artery disease EXAM: CHEST - 2 VIEW COMPARISON:  07/19/2019 FINDINGS: Frontal and lateral views of the chest demonstrate a stable cardiac silhouette. Background emphysema unchanged, without superimposed airspace disease, effusion, or pneumothorax. There are no acute bony abnormalities. IMPRESSION: 1. Emphysema.  No acute process. Electronically Signed   By: Randa Ngo M.D.   On: 04/23/2020 15:06   Procedures Procedures   Medications Ordered in ED Medications  nitroGLYCERIN (NITROSTAT) SL tablet 0.4 mg (0.4 mg Sublingual Given 04/23/20 1534)  nitroGLYCERIN (NITROSTAT) SL tablet 0.4  mg (0.4 mg Sublingual Given 04/23/20 1841)    ED Course  I have reviewed the triage vital signs and the nursing notes.  Pertinent labs & imaging results that were available during my care of the patient were reviewed by me and considered in my medical decision making (see chart for details).  Clinical Course as of 04/23/20 1842  Tue Apr 23, 2020  1822 Consult to cardiologist Dr. Ena Dawley, who recommends transfer of the patient over to [facility] for cardiology admission and further work up. I appreciate her collaboration in the care of this patient.  [RS]    Clinical Course User Index [RS]  Kamdin Follett, Gypsy Balsam, PA-C   MDM Rules/Calculators/A&P HEAR Score: 49                         65 year old male with history of coronary artery disease who presents with left-sided chest pain that has been intermittent all day this morning since he woke up, not relieved by nitroglycerin.  Differential diagnosis for this patient symptoms include but are not limited to ACS, aortic dissection, coronary artery dissection, PE, pneumothorax, musculoskeletal pain, stable angina, valvular disease.  Hypertensive on intake, vital signs otherwise normal.  Cardiopulmonary exam is normal, abdominal exam is benign.  Patient is neurovascularly intact all 4 extremities.  We will proceed with chest pain work-up.  EKG with sinus rhythm, no STEMI.  Chest x-ray negative for acute cardiopulmonary disease.  CBC is unremarkable, BMP is unremarkable, troponin is negative, 9.  We will proceed with delta troponin given patient's heart score is elevated.  Cannot use PERC criteria as patient is >60 years old, however patient has no risk factors for PE at this time.  Patient reevaluated after ministration of nitroglycerin  sublingual tablet here in the ER, with total resolution of his pain for the last 2 hours. Of note, he states that the nitroglycerin here burned when placed under his tongue; he did not have any tingling or burning sensation with his nitroglycerin at home this morning, and states the tablets are 65 months old. Delta troponin negative, stable at 9.   Suspect patient's symptoms are related to angina. Will consult cardiology at this time.   Per cardiology consult as above, patient will require transfer to [facility] for cardiology admission and further evaluation.  Temporary admission orders placed, [facility] aware.  [place] voiced understanding of his medical evaluation and treatment plan.  Each of his questions was answered to his expressed satisfaction.  Return precautions given.  Patient is stable and  appropriate for transfer at this time.  This chart was dictated using voice recognition software, Dragon. Despite the best efforts of this provider to proofread and correct errors, errors may still occur which can change documentation meaning.  Final Clinical Impression(s) / ED Diagnoses Final diagnoses:  None    Rx / DC Orders ED Discharge Orders    None       Aura Dials 04/23/20 Carole Civil, MD 04/23/20 1946

## 2020-04-24 ENCOUNTER — Encounter (HOSPITAL_COMMUNITY)
Admission: EM | Disposition: A | Discharge status: Home / Self Care | Payer: Self-pay | Source: Home / Self Care | Attending: Cardiology

## 2020-04-24 ENCOUNTER — Encounter (HOSPITAL_COMMUNITY): Payer: Self-pay | Admitting: Interventional Cardiology

## 2020-04-24 DIAGNOSIS — I25110 Atherosclerotic heart disease of native coronary artery with unstable angina pectoris: Secondary | ICD-10-CM | POA: Diagnosis not present

## 2020-04-24 DIAGNOSIS — I10 Essential (primary) hypertension: Secondary | ICD-10-CM | POA: Diagnosis not present

## 2020-04-24 DIAGNOSIS — I200 Unstable angina: Secondary | ICD-10-CM

## 2020-04-24 DIAGNOSIS — I2510 Atherosclerotic heart disease of native coronary artery without angina pectoris: Secondary | ICD-10-CM | POA: Diagnosis not present

## 2020-04-24 HISTORY — PX: LEFT HEART CATH AND CORONARY ANGIOGRAPHY: CATH118249

## 2020-04-24 LAB — URINALYSIS, ROUTINE W REFLEX MICROSCOPIC
Bacteria, UA: NONE SEEN
Bilirubin Urine: NEGATIVE
Glucose, UA: NEGATIVE mg/dL
Ketones, ur: NEGATIVE mg/dL
Nitrite: POSITIVE — AB
Protein, ur: NEGATIVE mg/dL
RBC / HPF: >50 RBC/hpf — ABNORMAL HIGH (ref 0–5)
RBC / HPF: >50 RBC/hpf — ABNORMAL HIGH (ref 0–5)
Specific Gravity, Urine: 1.028 (ref 1.005–1.030)
WBC, UA: >50 WBC/hpf — ABNORMAL HIGH (ref 0–5)
pH: 6 (ref 5.0–8.0)

## 2020-04-24 LAB — CBC
HCT: 42.7 % (ref 39.0–52.0)
Hemoglobin: 13.7 g/dL (ref 13.0–17.0)
MCH: 32.6 pg (ref 26.0–34.0)
MCHC: 32.1 g/dL (ref 30.0–36.0)
MCV: 101.7 fL — ABNORMAL HIGH (ref 80.0–100.0)
Platelets: 148 10*3/uL — ABNORMAL LOW (ref 150–400)
RBC: 4.2 MIL/uL — ABNORMAL LOW (ref 4.22–5.81)
RDW: 13.1 % (ref 11.5–15.5)
WBC: 5.9 10*3/uL (ref 4.0–10.5)
nRBC: 0 % (ref 0.0–0.2)

## 2020-04-24 LAB — CREATININE, SERUM
Creatinine, Ser: 0.77 mg/dL (ref 0.61–1.24)
GFR, Estimated: >60 mL/min (ref >60)

## 2020-04-24 LAB — LIPID PANEL
Cholesterol: 155 mg/dL (ref 0–200)
HDL: 49 mg/dL (ref >40)
LDL Cholesterol: 65 mg/dL (ref 0–99)
Total CHOL/HDL Ratio: 3.2 RATIO
Triglycerides: 203 mg/dL — ABNORMAL HIGH (ref <150)
VLDL: 41 mg/dL — ABNORMAL HIGH (ref 0–40)

## 2020-04-24 LAB — HEMOGLOBIN A1C
Hgb A1c MFr Bld: 5.7 % — ABNORMAL HIGH (ref 4.8–5.6)
Mean Plasma Glucose: 116.89 mg/dL

## 2020-04-24 SURGERY — LEFT HEART CATH AND CORONARY ANGIOGRAPHY
Anesthesia: LOCAL

## 2020-04-24 MED ORDER — ASPIRIN 81 MG PO CHEW: 81.0000 mg | CHEWABLE_TABLET | Freq: Every day | ORAL | Status: DC

## 2020-04-24 MED ORDER — IOHEXOL 350 MG/ML SOLN
INTRAVENOUS | Status: DC | PRN
  Administered 2020-04-24: 75 mL

## 2020-04-24 MED ORDER — ALBUTEROL SULFATE HFA 108 (90 BASE) MCG/ACT IN AERS: 2.0000 | INHALATION_SPRAY | Freq: Four times a day (QID) | RESPIRATORY_TRACT | Status: DC | PRN

## 2020-04-24 MED ORDER — LIDOCAINE HCL (PF) 1 % IJ SOLN
INTRAMUSCULAR | Status: AC
  Filled 2020-04-24: qty 30

## 2020-04-24 MED ORDER — VERAPAMIL HCL 2.5 MG/ML IV SOLN
INTRAVENOUS | Status: DC | PRN
  Administered 2020-04-24: 10 mL via INTRA_ARTERIAL

## 2020-04-24 MED ORDER — CLOPIDOGREL BISULFATE 75 MG PO TABS
75.0000 mg | ORAL_TABLET | Freq: Every day | ORAL | Status: DC
  Administered 2020-04-25: 75 mg via ORAL
  Filled 2020-04-24: qty 1

## 2020-04-24 MED ORDER — MIDAZOLAM HCL 2 MG/2ML IJ SOLN
INTRAMUSCULAR | Status: DC | PRN
  Administered 2020-04-24 (×2): 1 mg via INTRAVENOUS

## 2020-04-24 MED ORDER — ENOXAPARIN SODIUM 40 MG/0.4ML ~~LOC~~ SOLN
40.0000 mg | SUBCUTANEOUS | Status: DC
  Administered 2020-04-25: 40 mg via SUBCUTANEOUS
  Filled 2020-04-24: qty 0.4

## 2020-04-24 MED ORDER — DIAZEPAM 5 MG PO TABS: 5.0000 mg | ORAL_TABLET | Freq: Two times a day (BID) | ORAL | Status: DC | PRN

## 2020-04-24 MED ORDER — HEPARIN SODIUM (PORCINE) 1000 UNIT/ML IJ SOLN
INTRAMUSCULAR | Status: AC
  Filled 2020-04-24: qty 1

## 2020-04-24 MED ORDER — SODIUM CHLORIDE 0.9 % IV SOLN: INTRAVENOUS | Status: AC

## 2020-04-24 MED ORDER — ISOSORBIDE MONONITRATE ER 30 MG PO TB24
30.0000 mg | ORAL_TABLET | Freq: Every day | ORAL | Status: DC
Start: 2020-04-24 — End: 2020-04-24
  Administered 2020-04-24: 30 mg via ORAL
  Filled 2020-04-24: qty 1

## 2020-04-24 MED ORDER — NITROGLYCERIN 0.4 MG SL SUBL: 0.4000 mg | SUBLINGUAL_TABLET | SUBLINGUAL | Status: DC | PRN

## 2020-04-24 MED ORDER — ENOXAPARIN SODIUM 40 MG/0.4ML ~~LOC~~ SOLN: 40.0000 mg | SUBCUTANEOUS | Status: DC

## 2020-04-24 MED ORDER — LIDOCAINE HCL (PF) 1 % IJ SOLN
INTRAMUSCULAR | Status: DC | PRN
  Administered 2020-04-24: 2 mL

## 2020-04-24 MED ORDER — ACETAMINOPHEN 325 MG PO TABS
650.0000 mg | ORAL_TABLET | ORAL | Status: DC | PRN
  Administered 2020-04-24: 650 mg via ORAL
  Filled 2020-04-24: qty 2

## 2020-04-24 MED ORDER — ISOSORBIDE MONONITRATE ER 60 MG PO TB24
60.0000 mg | ORAL_TABLET | Freq: Every day | ORAL | Status: DC
  Administered 2020-04-25: 60 mg via ORAL
  Filled 2020-04-24: qty 1

## 2020-04-24 MED ORDER — MIDAZOLAM HCL 2 MG/2ML IJ SOLN
INTRAMUSCULAR | Status: AC
  Filled 2020-04-24: qty 2

## 2020-04-24 MED ORDER — SODIUM CHLORIDE 0.9 % IV SOLN
INTRAVENOUS | Status: AC | PRN
  Administered 2020-04-24: 100 mL/h via INTRAVENOUS

## 2020-04-24 MED ORDER — ATENOLOL 25 MG PO TABS
50.0000 mg | ORAL_TABLET | Freq: Every day | ORAL | Status: DC
  Administered 2020-04-24 – 2020-04-25 (×2): 50 mg via ORAL
  Filled 2020-04-24 (×2): qty 2

## 2020-04-24 MED ORDER — UMECLIDINIUM-VILANTEROL 62.5-25 MCG/INH IN AEPB
1.0000 | INHALATION_SPRAY | Freq: Every day | RESPIRATORY_TRACT | Status: DC
  Administered 2020-04-24 – 2020-04-25 (×2): 1 via RESPIRATORY_TRACT
  Filled 2020-04-24: qty 14

## 2020-04-24 MED ORDER — LABETALOL HCL 5 MG/ML IV SOLN: 10.0000 mg | INTRAVENOUS | Status: AC | PRN

## 2020-04-24 MED ORDER — MECLIZINE HCL 25 MG PO TABS: 25.0000 mg | ORAL_TABLET | Freq: Three times a day (TID) | ORAL | Status: DC | PRN

## 2020-04-24 MED ORDER — HEPARIN (PORCINE) IN NACL 1000-0.9 UT/500ML-% IV SOLN
INTRAVENOUS | Status: AC
  Filled 2020-04-24: qty 1000

## 2020-04-24 MED ORDER — ONDANSETRON HCL 4 MG/2ML IJ SOLN: 4.0000 mg | Freq: Four times a day (QID) | INTRAMUSCULAR | Status: DC | PRN

## 2020-04-24 MED ORDER — ATORVASTATIN CALCIUM 80 MG PO TABS
80.0000 mg | ORAL_TABLET | Freq: Every evening | ORAL | Status: DC
  Administered 2020-04-24: 80 mg via ORAL
  Filled 2020-04-24: qty 1

## 2020-04-24 MED ORDER — AMLODIPINE BESYLATE 5 MG PO TABS
5.0000 mg | ORAL_TABLET | Freq: Every day | ORAL | Status: DC
  Administered 2020-04-24 – 2020-04-25 (×2): 5 mg via ORAL
  Filled 2020-04-24 (×2): qty 1

## 2020-04-24 MED ORDER — FENTANYL CITRATE (PF) 100 MCG/2ML IJ SOLN
INTRAMUSCULAR | Status: DC | PRN
  Administered 2020-04-24: 25 ug via INTRAVENOUS

## 2020-04-24 MED ORDER — SODIUM CHLORIDE 0.9 % IV SOLN: 250.0000 mL | INTRAVENOUS | Status: DC | PRN

## 2020-04-24 MED ORDER — PANTOPRAZOLE SODIUM 40 MG PO TBEC
40.0000 mg | DELAYED_RELEASE_TABLET | Freq: Two times a day (BID) | ORAL | Status: DC
  Administered 2020-04-24 – 2020-04-25 (×3): 40 mg via ORAL
  Filled 2020-04-24 (×3): qty 1

## 2020-04-24 MED ORDER — HEPARIN SODIUM (PORCINE) 1000 UNIT/ML IJ SOLN
INTRAMUSCULAR | Status: DC | PRN
  Administered 2020-04-24: 4000 [IU] via INTRAVENOUS

## 2020-04-24 MED ORDER — FENTANYL CITRATE (PF) 100 MCG/2ML IJ SOLN
INTRAMUSCULAR | Status: AC
  Filled 2020-04-24: qty 2

## 2020-04-24 MED ORDER — SODIUM CHLORIDE 0.9% FLUSH: 3.0000 mL | Freq: Two times a day (BID) | INTRAVENOUS | Status: DC

## 2020-04-24 MED ORDER — VERAPAMIL HCL 2.5 MG/ML IV SOLN
INTRAVENOUS | Status: AC
  Filled 2020-04-24: qty 2

## 2020-04-24 MED ORDER — SODIUM CHLORIDE 0.9% FLUSH
3.0000 mL | INTRAVENOUS | Status: DC | PRN
Start: 2020-04-24 — End: 2020-04-25

## 2020-04-24 MED ORDER — OXYCODONE HCL 5 MG PO TABS: 5.0000 mg | ORAL_TABLET | ORAL | Status: DC | PRN

## 2020-04-24 MED ORDER — ACETAMINOPHEN 325 MG PO TABS: 650.0000 mg | ORAL_TABLET | ORAL | Status: DC | PRN

## 2020-04-24 MED ORDER — HYDRALAZINE HCL 20 MG/ML IJ SOLN
10.0000 mg | INTRAMUSCULAR | Status: AC | PRN
Start: 2020-04-24 — End: 2020-04-24

## 2020-04-24 MED ORDER — ASPIRIN EC 81 MG PO TBEC
81.0000 mg | DELAYED_RELEASE_TABLET | Freq: Every day | ORAL | Status: DC
  Administered 2020-04-24 – 2020-04-25 (×2): 81 mg via ORAL
  Filled 2020-04-24 (×2): qty 1

## 2020-04-24 MED ORDER — HEPARIN (PORCINE) IN NACL 1000-0.9 UT/500ML-% IV SOLN
INTRAVENOUS | Status: DC | PRN
  Administered 2020-04-24 (×3): 500 mL

## 2020-04-24 SURGICAL SUPPLY — 10 items
CATH 5FR JL3.5 JR4 ANG PIG MP (CATHETERS) ×1 IMPLANT
DEVICE RAD COMP TR BAND LRG (VASCULAR PRODUCTS) ×1 IMPLANT
GLIDESHEATH SLEND A-KIT 6F 22G (SHEATH) ×1 IMPLANT
GUIDEWIRE INQWIRE 1.5J.035X260 (WIRE) IMPLANT
INQWIRE 1.5J .035X260CM (WIRE) ×2
KIT HEART LEFT (KITS) ×2 IMPLANT
PACK CARDIAC CATHETERIZATION (CUSTOM PROCEDURE TRAY) ×2 IMPLANT
SHEATH PROBE COVER 6X72 (BAG) ×1 IMPLANT
TRANSDUCER W/STOPCOCK (MISCELLANEOUS) ×2 IMPLANT
TUBING CIL FLEX 10 FLL-RA (TUBING) ×2 IMPLANT

## 2020-04-24 NOTE — Progress Notes (Signed)
Patient now has bright red blood when urinating.  Urine specimen resent. Patient still complains of burning when urinating.  Will continue to monitor.    Donah Driver, RN

## 2020-04-24 NOTE — Interval H&P Note (Signed)
Cath Lab Visit (complete for each Cath Lab visit)  Clinical Evaluation Leading to the Procedure:   ACS: No.  Non-ACS:    Anginal Classification: CCS III  Anti-ischemic medical therapy: Minimal Therapy (1 class of medications)  Non-Invasive Test Results: No non-invasive testing performed  Prior CABG: No previous CABG      History and Physical Interval Note:  04/24/2020 12:05 PM  Bill Johnson  has presented today for surgery, with the diagnosis of unstable angina.  The various methods of treatment have been discussed with the patient and family. After consideration of risks, benefits and other options for treatment, the patient has consented to  Procedure(s): LEFT HEART CATH AND CORONARY ANGIOGRAPHY (N/A) as a surgical intervention.  The patient's history has been reviewed, patient examined, no change in status, stable for surgery.  I have reviewed the patient's chart and labs.  Questions were answered to the patient's satisfaction.     Belva Crome III

## 2020-04-24 NOTE — Progress Notes (Signed)
Patient states that when he urinates that he has a lot of burning.  Patient states "it feels like fire when it comes out".  Visible pinkish tinge noted in patients urine.  MD notified.  Per MD order urine specimen obtained.  Pain medication given to help with the discomfort per MD standing orders.  Will continue to monitor.    Donah Driver, RN

## 2020-04-24 NOTE — Progress Notes (Addendum)
Progress Note  Patient Name: Bill Johnson Date of Encounter: 04/24/2020  [facility] Cardiologist: Previously seen by Dr. Bronson Ing.  Subjective   Admitted overnight with chest pain concerning for chest pain. Continue to have very mild right sided to mid chest heaviness this morning. Ranks it as a 1/10. States it feels similar to pain prior to previous PCI. No shortness of breath.  Inpatient Medications    Scheduled Meds: . amLODipine  5 mg Oral Daily  . aspirin EC  81 mg Oral Daily  . atenolol  50 mg Oral Daily  . atorvastatin  80 mg Oral QPM  . isosorbide mononitrate  30 mg Oral Daily  . pantoprazole  40 mg Oral BID  . umeclidinium-vilanterol  1 puff Inhalation Daily   Continuous Infusions:  PRN Meds: acetaminophen, albuterol, diazepam, meclizine, nitroGLYCERIN, ondansetron (ZOFRAN) IV   Vital Signs    Vitals:   04/23/20 2223 04/23/20 2300 04/23/20 2337 04/24/20 0411  BP:  (!) 142/82  124/76  Pulse: 80 65  65  Resp:  17  15  Temp:   97.6 F (36.4 C) 97.7 F (36.5 C)  TempSrc:   Oral Oral  SpO2:      Weight:    75.6 kg  Height:        Intake/Output Summary (Last 24 hours) at 04/24/2020 0722 Last data filed at 04/24/2020 0411 Gross per 24 hour  Intake --  Output 700 ml  Net -700 ml   Last 3 Weights 04/24/2020 04/23/2020 04/23/2020  Weight (lbs) 166 lb 10.7 oz 167 lb 8 oz 172 lb  Weight (kg) 75.6 kg 75.978 kg 78.019 kg      Telemetry    Sinus rhythm with rates in the 50's to 70's. - Personally Reviewed  ECG    No new ECG tracing today. - Personally Reviewed  Physical Exam   GEN: No acute distress.   Neck: No JVD. Cardiac: RRR. No murmurs, rubs, or gallops.  Respiratory: Clear to auscultation bilaterally. No wheezes, rhonchi, or rales. GI: Soft, non-distended, and non-tender.  MS: No lower extremity edema. No deformity. Skin: Warm and dry. Neuro:  No focal deficits. Psych: Normal affect. Responds appropriately.  Labs    High Sensitivity  Troponin:   Recent Labs  Lab 04/23/20 1511 04/23/20 1631  TROPONINIHS 9 9      Chemistry Recent Labs  Lab 04/23/20 1511  NA 137  K 3.7  CL 105  CO2 23  GLUCOSE 94  BUN 14  CREATININE 0.78  CALCIUM 8.3*  GFRNONAA >60  ANIONGAP 9     Hematology Recent Labs  Lab 04/23/20 1511  WBC 6.8  RBC 4.11*  HGB 13.8  HCT 41.7  MCV 101.5*  MCH 33.6  MCHC 33.1  RDW 13.0  PLT 178    BNPNo results for input(s): BNP, PROBNP in the last 168 hours.   DDimer No results for input(s): DDIMER in the last 168 hours.   Radiology    DG Chest 2 View  Result Date: 04/23/2020 CLINICAL DATA:  Chest pain, coronary artery disease EXAM: CHEST - 2 VIEW COMPARISON:  07/19/2019 FINDINGS: Frontal and lateral views of the chest demonstrate a stable cardiac silhouette. Background emphysema unchanged, without superimposed airspace disease, effusion, or pneumothorax. There are no acute bony abnormalities. IMPRESSION: 1. Emphysema.  No acute process. Electronically Signed   By: Randa Ngo M.D.   On: 04/23/2020 15:06    Cardiac Studies   Cardiac Catheterization 07/20/2019:  Previously placed Mid  LAD drug eluting stent is widely patent.  Previously placed Prox LAD to Mid LAD drug eluting stent is widely patent.  1st Mrg lesion is 95% stenosed.  A drug-eluting stent was successfully placed using a STENT RESOLUTE ONYX 2.25X26, postdilated to 2.6 mm.  Post intervention, there is a 0% residual stenosis.  The left ventricular systolic function is normal.  LV end diastolic pressure is normal.  The left ventricular ejection fraction is 55-65% by visual estimate.  There is no aortic valve stenosis.   Continue aggressive secondary prevention.  Consider clopidogrel monotherapy after 12 months.   Watch overnight.  Home tomorrow.   He is interested in getting the COVID-19 vaccine.  I encouraged him to get the vaccines when possible.   Patient Profile     65 y.o. male with a history of CAD  s/p DES x2 to ostial to mid LAD and DES to OM1 in 06/2019, COPD, hypertension, hyperlipidemia, GERD with esophagitis, cervical spondylosis, osteoarthritis, former tobacco use, and alcohol use who was admitted with chest pain concerning for unstable angina.  Assessment & Plan    Unstable Angina History of CAD - S/p DES x2 to LAD and DES to OM1 in 06/2019. Now presents with chest at rest. - EKG showed no acute changes. - High-sensitivity troponin negative x2.  - Continues to have very mild chest pain. - Continue aspirin, beta-blocker, and statin. Home Plavix was held on admission. - Continue Imdur 30mg  daily. - Pain similar to pain he had prior to PCI in 06/2019. May need repeat LHC. Will discuss with MD. I did go ahead and explain procedure and patient has agreed to proceed with this if MD recommends cath.  Hypertension - BP well controlled. - Continue Amlodipine 5mg  daily, Atenolol 50mg  daily daily,and Imdur 30mg  daily.  Hyperlipidemia - Lipid panel this admission: Total Cholesterol 155, Triglycerides 203, HDL 49, LDL 65. - Continue Lipitor 80mg  daily.  - Consider adding Vascepa.   Pre-Diabetes - Hemoglobin A1c 5.7. - Recommend lifestyle medications at this time.  COPD - Stable. No wheezing. - Continue home inhalers.  GERD - Continue home Protonix 40mg  twice daily.  For questions or updates, please contact [place] Please consult [email or web address] for contact info under      Signed, Darreld Mclean, PA-C  04/24/2020, 7:22 AM    Personally seen and examined. Agree with APP above with the following comments: Briefly 65 yo M with a history of CAD s/p OM1 and LAD PCI last 06/2019. Patient notes a return of his chest pressure, with modest activity. Notes adherence with all of his medications. Exam notable for R radial +2 CTAB with good air movement Labs notable for normal Cr; no barrier to cath Personally reviewed relevant tests; unable to bring up cath films but reviewed  CCTA_had so mild mRCA disease as far back as 5/21. Would recommend  - offer trial of uptitration of medical therapy vs repeat LHC; discussed risks and benefits; including that we may only have non-obstructive disease - patient amenable to proceed. -reasonable for repeat LHC; would be appropriate vascepa candidate - patient has stopped smoking -ordering DVT PPX; labs - patient is full Code  Rudean Haskell, MD [facility]  [address], [address] [place], [place] [address] [phone]  10:36 AM

## 2020-04-24 NOTE — CV Procedure (Signed)
   Widely patent LAD and obtuse marginal stents.  50 to 60% ostial PDA which arises early from the mid RCA.  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  Ongoing symptoms in the absence of EKG changes or enzyme elevation is consistent with noncardiac chest pain.

## 2020-04-25 DIAGNOSIS — I209 Angina pectoris, unspecified: Secondary | ICD-10-CM

## 2020-04-25 LAB — BASIC METABOLIC PANEL
Anion gap: 7 (ref 5–15)
BUN: 11 mg/dL (ref 8–23)
CO2: 22 mmol/L (ref 22–32)
Calcium: 8.3 mg/dL — ABNORMAL LOW (ref 8.9–10.3)
Chloride: 109 mmol/L (ref 98–111)
Creatinine, Ser: 0.83 mg/dL (ref 0.61–1.24)
GFR, Estimated: >60 mL/min (ref >60)
Glucose, Bld: 94 mg/dL (ref 70–99)
Potassium: 3.6 mmol/L (ref 3.5–5.1)
Sodium: 138 mmol/L (ref 135–145)

## 2020-04-25 LAB — CBC
HCT: 39.3 % (ref 39.0–52.0)
Hemoglobin: 12.9 g/dL — ABNORMAL LOW (ref 13.0–17.0)
MCH: 33.2 pg (ref 26.0–34.0)
MCHC: 32.8 g/dL (ref 30.0–36.0)
MCV: 101.3 fL — ABNORMAL HIGH (ref 80.0–100.0)
Platelets: 158 10*3/uL (ref 150–400)
RBC: 3.88 MIL/uL — ABNORMAL LOW (ref 4.22–5.81)
RDW: 13.1 % (ref 11.5–15.5)
WBC: 10.2 10*3/uL (ref 4.0–10.5)
nRBC: 0 % (ref 0.0–0.2)

## 2020-04-25 MED ORDER — ISOSORBIDE MONONITRATE ER 60 MG PO TB24: 60.0000 mg | ORAL_TABLET | Freq: Every day | ORAL | 3 refills | Status: DC

## 2020-04-25 NOTE — Discharge Summary (Addendum)
Discharge Summary    Patient ID: Bill Johnson MRN: [id number]; DOB: 13-Jul-1955  Admit date: 04/23/2020 Discharge date: 04/25/2020  PCP:  Lindell Spar, MD   [place]  Cardiologist:  Former Dr. 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  The vessel is very small in size and contains luminal irregularities.  The right coronary is dominant.  The PDA arises from the distal portion of the mid vessel.  The origin of the PDA contains a 50 to 60% eccentric narrowing.  Left main is widely patent.  The left main gives origin to the LAD and what should be considered a ramus intermedius that then bifurcates.  The superior most branch of the ramus bifurcation contains a patent stent.  The LAD is a large vessel and contains an ostial to mid vessel stent the jails a diagonal distally.  The diagonal contains ostial 50 to 70% narrowing.  Normal left ventricular function.  LVEF 55%.  RECOMMENDATIONS:   The previously placed stents are widely patent.  Ongoing chest pain without enzyme abnormality or EKG changes suggests the possibility of an alternative explanation.  Per treating team _____________   History of Present Illness     Bill Johnson is a 65 y.o. male with a history of HTN, GERD with esophagitis, COPD, OA, cervical spondylosis, anxiety, tobacco and alcohol abuse, CAD s/p DESx2 to o-mLAD 07/05/19 for CP and +CCTA, then DES to 95% OM1 stenosis 07/20/19 for residual symptoms. EF preserved. Trop has never been elevated. He presented to the [facility] ED 04/23/20 for non-exertional chest pain with  negative troponins and has been transferred here for further evaluation.  He reports doing well overall since last June following his second PCI. He has had some residual angina at times with activities such as using a push mower or walking longer distances, but resolves quickly with rest and has been stable. He is compliant with medications, which include Plavix, ASA, Imdur 30, atenolol 50, Norvasc 5. Not smoking.   Yesterday morning, 04/23/20, at 7:30 AM he woke up with chest pain that was continuous and persisted throughout the morning, seemed to worsen periodically. Unclear if exertional because he was trying to rest. No radiation or associated nausea, dyspnea, or diaphoresis. He tried taking a nitro around 10:30 without relief, but notes they were expired. He was given nitro en route with improvement in his pain. Worsened again and again relieved by nitro in the ED. Initial vitals normal, no ECG changes, troponin 9->9. Accepted for transfer for further evaluation. Remains chest pain free.  Hospital Course     Consultants:   Chest pain  CAD s/p PCI to LAD and Cx Patient presented with symptoms concerning for unstable angina. HS troponin remained negative and EKG was nonischemic. Given his history, we elected to perform definitive angiography which showed widely patent prior stents with mild ISR in the proximal LAD. Noncardiac etiology for chest pain was suggested. He will remain on ASA and plavix, BB, statin. He is on 30 mg imdur.  During his last admission, he had chest pain following intervention and it was suggested that a jailed diagonal  could be the source of this pain. Pressure is well-controlled prior to morning medications. Will increase imdur to 60 mg.  He is not following a low sodium diet at home (hotdogs and canned soup) - needs education at follow up appt (e.g. salty six).    Hyperlipidemia with LDL goal < 70 04/24/2020: Cholesterol 155; HDL 49; LDL Cholesterol 65; Triglycerides 203;  VLDL 41 Continue 80 mg lipitor.    Hypertension Continue medications as above, increase imdur to 60 mg.   Former smoker Former alcohol use Has stopped both, encouraged continued cessation.    Lung nodules seen on CT 2021. Will need repeat CT chest with PCP. Antonieta Loveless  - new PCP   Did the patient have an acute coronary syndrome (MI, NSTEMI, STEMI, etc) this admission?:  No                               Did the patient have a percutaneous coronary intervention (stent / angioplasty)?:  No.       _____________  Discharge Vitals Blood pressure 124/80, pulse 71, temperature 97.8 F (36.6 C), temperature source Oral, resp. rate 18, height 5\' 4"  (1.626 m), weight 76.1 kg, SpO2 95 %.  Filed Weights   04/23/20 2205 04/24/20 0411 04/25/20 0613  Weight: 76 kg 75.6 kg 76.1 kg   Physical Exam Constitutional:      Appearance: Normal appearance. He is obese.  HENT:     Head: Normocephalic and atraumatic.  Eyes:     Extraocular Movements: Extraocular movements intact.  Neck:     Vascular: No carotid bruit.  Cardiovascular:     Rate and Rhythm: Normal rate and regular rhythm.     Pulses: Normal pulses.     Heart sounds: Normal heart sounds. No murmur heard.   Pulmonary:     Effort: Pulmonary effort is normal.     Breath sounds: Normal breath sounds.  Musculoskeletal:     Right lower leg: No edema.     Left lower leg: No edema.  Lymphadenopathy:     Cervical: No cervical adenopathy.  Skin:    General: Skin is dry.  Neurological:     Mental Status: He is alert and oriented to person, place, and time.  Psychiatric:        Behavior: Behavior normal.    Right cath site C/D/I, pulse present  Labs & Radiologic Studies    CBC Recent Labs    04/24/20 0400 04/25/20 0227  WBC 5.9 10.2  HGB 13.7 12.9*  HCT 42.7 39.3  MCV 101.7* 101.3*  PLT 148* 086   Basic Metabolic Panel Recent Labs    04/23/20 1511 04/24/20 0400 04/25/20 0227  NA 137  --  138  K 3.7  --  3.6   CL 105  --  109  CO2 23  --  22  GLUCOSE 94  --  94  BUN 14  --  11  CREATININE 0.78 0.77 0.83  CALCIUM 8.3*  --  8.3*   Liver Function Tests No results for input(s): AST, ALT, ALKPHOS, BILITOT, PROT, ALBUMIN in the last 72 hours. No results for input(s): LIPASE, AMYLASE in the last 72 hours. High Sensitivity Troponin:   Recent Labs  Lab 04/23/20 1511 04/23/20 1631  TROPONINIHS 9 9    BNP Invalid input(s): POCBNP D-Dimer No results for input(s): DDIMER in the last 72 hours. Hemoglobin A1C Recent Labs    04/24/20 0400  HGBA1C 5.7*   Fasting Lipid Panel Recent Labs    04/24/20 0400  CHOL 155  HDL 49  LDLCALC 65  TRIG 203*  CHOLHDL 3.2   Thyroid Function Tests No results for input(s): TSH, T4TOTAL, T3FREE, THYROIDAB in the last 72 hours.  Invalid input(s): FREET3 _____________  DG Chest 2 View  Result Date: 04/23/2020 CLINICAL DATA:  Chest pain, coronary artery disease EXAM: CHEST - 2 VIEW COMPARISON:  07/19/2019 FINDINGS: Frontal and lateral views of the chest demonstrate a stable cardiac silhouette. Background emphysema unchanged, without superimposed airspace disease, effusion, or pneumothorax. There are no acute bony abnormalities. IMPRESSION: 1. Emphysema.  No acute process. Electronically Signed   By: Randa Ngo M.D.   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  The vessel is very small in size and contains luminal irregularities.  The right coronary is dominant.  The PDA arises from the distal portion of the mid vessel.  The origin of the PDA contains a 50 to 60% eccentric narrowing.  Left main is widely patent.  The left main gives origin to the LAD and what should be considered a ramus intermedius that then bifurcates.  The superior most branch of the ramus bifurcation contains a patent stent.  The LAD is a large vessel and contains an  ostial to mid vessel stent the jails a diagonal distally.  The diagonal contains ostial 50 to 70% narrowing.  Normal left ventricular function.  LVEF 55%. RECOMMENDATIONS:  The previously placed stents are widely patent.  Ongoing chest pain without enzyme abnormality or EKG changes suggests the possibility of an alternative explanation.  Per treating team  Disposition   Pt is being discharged home today in good condition.  Follow-up Plans & Appointments     Follow-up Information    Verta Ellen., NP Follow up on 05/06/2020.   Specialty: Cardiology Why: 3:30 - hospital follow up Contact information: [place] [address] [phone]              Discharge Instructions    Diet - low sodium heart healthy   Complete by: As directed    Discharge instructions   Complete by: As directed    No driving for 1 week. No lifting over 5 lbs for 1 week. No sexual activity for 1 week. Keep procedure site clean & dry. If you notice increased pain, swelling, bleeding or pus, call/return!  You may shower, but no soaking baths/hot tubs/pools for 1 week.   Increase activity slowly   Complete by: As directed       Discharge Medications   Allergies as of 04/25/2020   No Known Allergies     Medication List    TAKE these medications   acetaminophen 500 MG tablet Commonly known as: TYLENOL Take 500 mg by mouth as needed for mild pain or headache.   albuterol 108 (90 Base) MCG/ACT inhaler Commonly known as: VENTOLIN HFA Inhale 2 puffs into the lungs every 6 (six) hours as needed for wheezing or shortness of breath.   amLODipine 5 MG tablet Commonly known as: NORVASC Take 1 tablet (5 mg total) by mouth daily.   aspirin 81 MG tablet Take 81 mg by mouth daily.   atenolol 50 MG tablet Commonly known as: TENORMIN Take 1 tablet (50 mg total) by mouth daily.   atorvastatin 80 MG tablet Commonly known  as: LIPITOR Take 1 tablet (80 mg total) by mouth  every evening.   cetirizine 10 MG tablet Commonly known as: ZYRTEC Take 10 mg by mouth daily.   clopidogrel 75 MG tablet Commonly known as: PLAVIX Take 1 tablet (75 mg total) by mouth daily.   [employer] Vitamin B-12 2000 MCG Tbcr Generic drug: Cyanocobalamin Take 2,000 mcg by mouth daily.   diazepam 5 MG tablet Commonly known as: VALIUM Take 1 tablet (5 mg total) by mouth every 12 (twelve) hours as needed for anxiety.   isosorbide mononitrate 60 MG 24 hr tablet Commonly known as: IMDUR Take 1 tablet (60 mg total) by mouth daily. What changed:   medication strength  how much to take   meclizine 25 MG tablet Commonly known as: ANTIVERT TAKE 1 TABLET BY MOUTH THREE TIMES DAILY AS NEEDED FOR DIZZINESS What changed:   how much to take  how to take this  when to take this  reasons to take this  additional instructions   nitroGLYCERIN 0.4 MG SL tablet Commonly known as: Nitrostat Place 1 tablet (0.4 mg total) under the tongue every 5 (five) minutes as needed for chest pain (up to 3 doses).   pantoprazole 40 MG tablet Commonly known as: PROTONIX Take 1 tablet (40 mg total) by mouth 2 (two) times daily.   umeclidinium-vilanterol 62.5-25 MCG/INH Aepb Commonly known as: ANORO ELLIPTA Inhale 1 puff into the lungs daily.          Outstanding Labs/Studies   Titrate anti-anginals  Needs CT chest for lung nodules  Duration of Discharge Encounter   Greater than 30 minutes including physician time.  Signed, Tami Lin Duke, PA 04/25/2020, 11:37 AM  Personally seen and examined. Agree with APP above with the following comments: Briefly anginal with mild exertion. Patient notes resolution of his angina presently Exam notable for no radial bruit or hematoma. Personally reviewed relevant tests; no change in LHC from prior. Would recommend increased Imdur.  Gave education on anginal and mgmt.  Rudean Haskell, MD [address] [place], [place] [address] [phone]  12:08 PM

## 2020-04-26 ENCOUNTER — Telehealth: Payer: Self-pay

## 2020-04-26 NOTE — Telephone Encounter (Signed)
Transition Care Management Unsuccessful Follow-up Telephone Call  Date of discharge and from where:  04/25/2020 from [facility]  Attempts:  1st Attempt  Reason for unsuccessful TCM follow-up call:  Left voice message

## 2020-04-29 ENCOUNTER — Telehealth: Payer: Self-pay

## 2020-04-29 NOTE — Telephone Encounter (Signed)
Transition Care Management Follow-up Telephone Call  Date of discharge and from where: 04/25/20 from [facility]   How have you been since you were released from the hospital? Pt is doing well, up and moving but still taking it slow.   Any questions or concerns? No  Items Reviewed:  Did the pt receive and understand the discharge instructions provided? Yes   Medications obtained and verified? Yes   Other? No   Any new allergies since your discharge? No   Dietary orders reviewed? Yes  Do you have support at home? Yes   Home Care and Equipment/Supplies: Were home health services ordered? no If so, what is the name of the agency? NA   Has the agency set up a time to come to the patient's home? no Were any new equipment or medical supplies ordered?  No What is the name of the medical supply agency? NA  Were you able to get the supplies/equipment? no Do you have any questions related to the use of the equipment or supplies? No  Functional Questionnaire: (I = Independent and D = Dependent) ADLs: I  Bathing/Dressing- I  Meal Prep- I  Eating- I  Maintaining continence- I  Transferring/Ambulation- I   Managing Meds- I   Follow up appointments reviewed:   PCP Hospital f/u appt confirmed? Yes  Scheduled to see Dr. Posey Pronto on 05/07/20 @ 9:40am.  [facility] f/u appt confirmed? Yes    Are transportation arrangements needed? No   If their condition worsens, is the pt aware to call PCP or go to the Emergency Dept.? Yes  Was the patient provided with contact information for the PCP's office or ED? Yes  Was to pt encouraged to call back with questions or concerns? Yes

## 2020-04-29 NOTE — Telephone Encounter (Signed)
Transition Care Management Unsuccessful Follow-up Telephone Call  Date of discharge and from where:  04/25/2020 from [facility]  Attempts:  2nd Attempt  Reason for unsuccessful TCM follow-up call:  Missing or invalid number

## 2020-04-30 NOTE — Telephone Encounter (Signed)
Transition Care Management Unsuccessful Follow-up Telephone Call  Date of discharge and from where:  04/25/2020 from [facility]  Attempts:  3rd Attempt  Reason for unsuccessful TCM follow-up call:  Unable to reach patient

## 2020-05-05 NOTE — Progress Notes (Signed)
Cardiology Office Note  Date: 05/06/2020   ID: Bill Johnson, DOB 1955-10-10, MRN [id number]  PCP:  Lindell Spar, MD  Cardiologist:  No primary care provider on file. Electrophysiologist:  None   Chief Complaint: F/U CAD status post cardiac catheterization 07/24/2019.  History of Present Illness: Bill Johnson is a 65 y.o. male with a history of chest pain, smoking, GERD, HLD, alcohol abuse.  Recently woke up on 04/23/2020 with chest pain that was continuous and persisted throughout the morning.  Seemed to worsen periodically.  No radiation or associated nausea, dyspnea, diaphoresis.  He was given NTG en route to [facility] ED with improvement in symptoms and received a subsequent NTG in ED with relief. He was transferred to [facility] for further evaluation. Underwent cardiac catheterization demonstrating widely patent prior stents with mild ISR to proximal LAD. Non cardiac etiology for CP was suggested.  Troponins were negative.  To remain on ASA, Plavix, Imdur, Lipitor.  Had stopped alcohol and smoking. Lung nodules on CT 2021. Advised would need repeat CT with PCP.  He denies any chest pain since having cardiac catheterization.  States that this is Imdur was increased to 60 mg has had no more chest pain.  Denies any exertional symptoms, palpitations or arrhythmias, orthostatic symptoms, CVA or TIA-like symptoms, bleeding.  Denies any claudication-like symptoms, DVT or PE-like symptoms, lower extremity edema.  He states today he has stopped smoking and alcohol.  States he still becomes short of breath.  He has COPD from long-term smoking.  He states he knows he has some spots on his lungs and will need follow-up for those.  Past Medical History:  Diagnosis Date  . Alcohol abuse   . Allergy    seasonal  . Anxiety   . CAD (coronary artery disease)    a. 06/2019 cath - s/p successful PCI of the ostial to mid LAD with IVUS guidance and DES x 2.  . Depression   . Essential hypertension    . GERD (gastroesophageal reflux disease)   . HOH (hard of hearing)   . Hyperlipidemia   . Hypertension   . OA (osteoarthritis) of knee   . Pulmonary nodules    a. seen on coronary CT 05/2019, will need OP f/u.  Marland Kitchen Rotator cuff tear arthropathy of both shoulders    On DISABILITY, non operative case  . Shoulder pain   . Tobacco abuse   . Vertigo     Past Surgical History:  Procedure Laterality Date  . CARDIAC CATHETERIZATION  06/2005   no significant CAD  . COLONOSCOPY WITH PROPOFOL N/A 05/01/2019   Procedure: COLONOSCOPY WITH PROPOFOL;  Surgeon: Daneil Dolin, MD;  [facility];  Service: Endoscopy;  Laterality: N/A;  7:30am  . COLONOSCOPY WITH PROPOFOL N/A 03/07/2020   Procedure: COLONOSCOPY WITH PROPOFOL;  Surgeon: Daneil Dolin, MD;  Location: [facility];  Service: Endoscopy;  Laterality: N/A;  8:30am  . CORONARY STENT INTERVENTION N/A 07/05/2019   Procedure: CORONARY STENT INTERVENTION;  Surgeon: Martinique, Peter M, MD;  Location: [place] [facility];  Service: Cardiovascular;  Laterality: N/A;  . CORONARY STENT INTERVENTION N/A 07/20/2019   Procedure: CORONARY STENT INTERVENTION;  Surgeon: Jettie Booze, MD;  Location: [place] [facility];  Service: Cardiovascular;  Laterality: N/A;  . ESOPHAGOGASTRODUODENOSCOPY  08/2005   Dr. Henrene Pastor: small hiatal hernia  . HERNIA REPAIR  1983  . INTRAVASCULAR PRESSURE WIRE/FFR STUDY N/A 07/05/2019   Procedure: INTRAVASCULAR PRESSURE WIRE/FFR  STUDY;  Surgeon: Martinique, Peter M, MD;  Location: [place] [facility];  Service: Cardiovascular;  Laterality: N/A;  . INTRAVASCULAR ULTRASOUND/IVUS N/A 07/05/2019   Procedure: Intravascular Ultrasound/IVUS;  Surgeon: Martinique, Peter M, MD;  Location: [place] [facility];  Service: Cardiovascular;  Laterality: N/A;  . LEFT HEART CATH AND CORONARY ANGIOGRAPHY N/A 07/05/2019   Procedure: LEFT HEART CATH AND CORONARY ANGIOGRAPHY;  Surgeon: Martinique, Peter M, MD;  Location: [place] [facility];  Service:  Cardiovascular;  Laterality: N/A;  . LEFT HEART CATH AND CORONARY ANGIOGRAPHY N/A 07/20/2019   Procedure: LEFT HEART CATH AND CORONARY ANGIOGRAPHY;  Surgeon: Jettie Booze, MD;  Location: [place] [facility];  Service: Cardiovascular;  Laterality: N/A;  . LEFT HEART CATH AND CORONARY ANGIOGRAPHY N/A 04/24/2020   Procedure: LEFT HEART CATH AND CORONARY ANGIOGRAPHY;  Surgeon: Belva Crome, MD;  Location: [place] [facility];  Service: Cardiovascular;  Laterality: N/A;  . POLYPECTOMY  05/01/2019   Procedure: POLYPECTOMY;  Surgeon: Daneil Dolin, MD;  [facility];  Service: Endoscopy;;  . POLYPECTOMY  03/07/2020   Procedure: POLYPECTOMY;  Surgeon: Daneil Dolin, MD;  [facility];  Service: Endoscopy;;  . ROTATOR CUFF REPAIR Bilateral   . TONSILLECTOMY  1977    Current Outpatient Medications  Medication Sig Dispense Refill  . acetaminophen (TYLENOL) 500 MG tablet Take 500 mg by mouth as needed for mild pain or headache.     . albuterol (PROVENTIL HFA;VENTOLIN HFA) 108 (90 Base) MCG/ACT inhaler Inhale 2 puffs into the lungs every 6 (six) hours as needed for wheezing or shortness of breath. 1 Inhaler 2  . amLODipine (NORVASC) 5 MG tablet Take 1 tablet (5 mg total) by mouth daily. 90 tablet 3  . aspirin 81 MG tablet Take 81 mg by mouth daily.    Marland Kitchen atenolol (TENORMIN) 50 MG tablet Take 1 tablet (50 mg total) by mouth daily. 90 tablet 2  . atorvastatin (LIPITOR) 80 MG tablet Take 1 tablet (80 mg total) by mouth every evening. 90 tablet 2  . cetirizine (ZYRTEC) 10 MG tablet Take 10 mg by mouth daily.    . clopidogrel (PLAVIX) 75 MG tablet Take 1 tablet (75 mg total) by mouth daily. 90 tablet 0  . CVS VITAMIN B-12 2000 MCG TBCR Take 2,000 mcg by mouth daily.    . diazepam (VALIUM) 5 MG tablet Take 1 tablet (5 mg total) by mouth every 12 (twelve) hours as needed for anxiety. 45 tablet 2  . isosorbide mononitrate (IMDUR) 60 MG 24 hr tablet Take 1 tablet (60 mg total) by mouth  daily. 90 tablet 3  . meclizine (ANTIVERT) 25 MG tablet TAKE 1 TABLET BY MOUTH THREE TIMES DAILY AS NEEDED FOR DIZZINESS (Patient taking differently: Take 25 mg by mouth 3 (three) times daily as needed for dizziness.) 90 tablet 2  . nitroGLYCERIN (NITROSTAT) 0.4 MG SL tablet Place 1 tablet (0.4 mg total) under the tongue every 5 (five) minutes as needed for chest pain (up to 3 doses). 25 tablet 3  . pantoprazole (PROTONIX) 40 MG tablet Take 1 tablet (40 mg total) by mouth 2 (two) times daily. 180 tablet 2  . umeclidinium-vilanterol (ANORO ELLIPTA) 62.5-25 MCG/INH AEPB Inhale 1 puff into the lungs daily. 1 each 11   No current facility-administered medications for this visit.   Allergies:  Patient has no known allergies.   Social History: The patient  reports that he has been smoking cigarettes. He started smoking  about 50 years ago. He has a 43.00 pack-year smoking history. He has never used smokeless tobacco. He reports current alcohol use. He reports that he does not use drugs.   Family History: The patient's family history includes Cancer in his brother and another family member; Diabetes in his mother; Heart disease in his father and mother; Hypertension in his father and mother; Stroke in an other family member.   ROS:  Please see the history of present illness. Otherwise, complete review of systems is positive for none.  All other systems are reviewed and negative.   Physical Exam: VS:  BP 124/72   Pulse 81   Ht 5\' 4"  (1.626 m)   Wt 175 lb 6.4 oz (79.6 kg)   SpO2 93%   BMI 30.11 kg/m , BMI Body mass index is 30.11 kg/m.  Wt Readings from Last 3 Encounters:  05/06/20 175 lb 6.4 oz (79.6 kg)  04/25/20 167 lb 12.8 oz (76.1 kg)  04/15/20 172 lb 6.4 oz (78.2 kg)    General: Patient appears comfortable at rest. Neck: Supple, no elevated JVP or carotid bruits, no thyromegaly. Lungs: Clear to auscultation, nonlabored breathing at rest. Cardiac: Regular rate and rhythm, no S3 or  significant systolic murmur, no pericardial rub. Extremities: No pitting edema, distal pulses 2+. Skin: Warm and dry.  Right radial clean and dry with 2+ pulse Musculoskeletal: No kyphosis. Neuropsychiatric: Alert and oriented x3, affect grossly appropriate.  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  The vessel is very small in size and contains luminal irregularities.  The right coronary is dominant.  The PDA arises from the distal portion of the mid vessel.  The origin of the PDA contains a 50 to 60% eccentric narrowing.  Left main is widely patent.  The left main gives origin to the LAD and what should be considered a ramus intermedius that then bifurcates.  The superior most branch of the ramus bifurcation contains a patent stent.  The LAD is a large vessel and contains an ostial to mid vessel stent the jails a diagonal distally.  The diagonal contains ostial 50 to 70% narrowing.  Normal left ventricular function.  LVEF 55%.  RECOMMENDATIONS:   The previously placed stents are widely patent.  Ongoing chest pain without enzyme abnormality or EKG changes suggests the possibility of an alternative explanation.  Per treating team  Diagnostic Dominance: Right       Echocardiogram: 02/2019 IMPRESSIONS  1. Left ventricular ejection fraction, by estimation, is 55 to 60%. The  left ventricle  has normal function. The left ventricle has no regional  wall motion abnormalities. Left ventricular diastolic parameters were  normal.  2. Right ventricular systolic function is normal. The right ventricular  size is normal. Tricuspid regurgitation signal is inadequate for assessing  PA pressure.  3. Left atrial size was upper normal.  4. Right atrial size was upper normal.  5. The mitral valve is grossly normal. Trivial mitral valve  regurgitation.  6. The aortic valve is tricuspid. Aortic valve regurgitation is mild.  7. The inferior vena cava is normal in size with greater than 50%  respiratory variability, suggesting right atrial pressure of 3 mmHg  Cardiac catheterization 07/05/2019   Prox LAD to Mid LAD lesion is 70% stenosed.  A drug-eluting stent was successfully placed using a STENT RESOLUTE ONYX 3.0X22.  Post intervention, there is a 0% residual stenosis.  Mid LAD lesion is 50% stenosed.  A drug-eluting stent was successfully placed using a STENT RESOLUTE ONYX 2.5X30.  Post intervention, there is a 0% residual stenosis.  1st Mrg lesion is 95% stenosed.  The left ventricular systolic function is normal.  LV end diastolic pressure is normal.  The left ventricular ejection fraction is 55-65% by visual estimate.   1. Severe 2 vessel obstructive CAD. Abnormal FFR of the LAD. 2. Normal LV function 3. Normal LVEDP 4. Successful PCI of the ostial to mid LAD with IVUS guidance and DES x 2.   Plan: DAPT for one year. Optimize medical therapy. Smoking cessation. If patient continues to have symptoms despite optimal therapy could consider PCI of the OM but this is a small branch and supplies a relatively small area of myocardium.  Diagnostic Dominance: Right   Intervention      Cardiac catheterization 07/20/2019   Previously placed Mid LAD drug eluting stent is widely patent.  Previously placed Prox LAD to Mid LAD drug eluting stent is widely  patent.  1st Mrg lesion is 95% stenosed.  A drug-eluting stent was successfully placed using a STENT RESOLUTE ONYX 2.25X26, postdilated to 2.6 mm.  Post intervention, there is a 0% residual stenosis.  The left ventricular systolic function is normal.  LV end diastolic pressure is normal.  The left ventricular ejection fraction is 55-65% by visual estimate.  There is no aortic valve stenosis.   Continue aggressive secondary prevention.  Consider clopidogrel monotherapy after 12 months.   Watch overnight.  Home tomorrow.   He is interested in getting the COVID-19 vaccine.  I encouraged him to get the vaccines when possible.  Diagnostic Dominance: Right  Intervention        Assessment and Plan:   1. CAD in native artery Status post cardiac catheterization on 04/24/2020 for chest pain. 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  Vessel is very small in size with luminal irregularities.  RCA is dominant.  PDA arises from the distal portion of the mid vessel.  Origin of PDA contains 50 to 60% eccentric narrowing.  Left main widely patent.  Left main is artery to LAD and which should be considered a ramus intermedius that then bifurcates.  The superiormost branch of the ramus bifurcation contains patent stent.  LAD is a large vessel and contains ostial mid vessel stent jailed the diagonal distally.  Diagonal contains 50 to 70% narrowing.  EF 55%.  Patient denies any current chest pain.  He states the increase in Imdur to 60 mg seems to have relieved his chest pain.  Continue aspirin 81 mg daily.  Continue Imdur 60 mg daily.  Continue sublingual nitroglycerin as needed.  Please refill sublingual nitroglycerin.  Patient states his has expired  2. Essential hypertension Blood pressure is well controlled on current therapy.  Blood pressure today 124/72.  Heart rate of 81.  Continue amlodipine 5 mg daily, atenolol 50 mg  daily  3. Mixed  hyperlipidemia Continue atorvastatin 80 mg daily.  Lipid panel on 04/25/2019 demonstrated total cholesterol 155, triglycerides 203, HDL 49, LDL 65.   4. Tobacco abuse Patient states he is not smoking anymore.  Encouraged him to continue with cessation  Medication Adjustments/Labs and Tests Ordered: Current medicines are reviewed at length with the patient today.  Concerns regarding medicines are outlined above.   Disposition: Follow-up with Dr. Harl Bowie or [facility] 6 months  Signed, [name] July, NP 05/06/2020 3:40 PM    [facility] [place], [place], [place] [address] Phone: [phone]; Fax: [phone]

## 2020-05-06 ENCOUNTER — Ambulatory Visit (INDEPENDENT_AMBULATORY_CARE_PROVIDER_SITE_OTHER): Payer: Medicare Other | Admitting: Family Medicine

## 2020-05-06 ENCOUNTER — Encounter: Payer: Self-pay | Admitting: Family Medicine

## 2020-05-06 VITALS — BP 124/72 | HR 81 | Ht 64.0 in | Wt 175.4 lb

## 2020-05-06 DIAGNOSIS — E782 Mixed hyperlipidemia: Secondary | ICD-10-CM | POA: Diagnosis not present

## 2020-05-06 DIAGNOSIS — R079 Chest pain, unspecified: Secondary | ICD-10-CM

## 2020-05-06 DIAGNOSIS — R918 Other nonspecific abnormal finding of lung field: Secondary | ICD-10-CM

## 2020-05-06 DIAGNOSIS — I2510 Atherosclerotic heart disease of native coronary artery without angina pectoris: Secondary | ICD-10-CM | POA: Diagnosis not present

## 2020-05-06 MED ORDER — NITROGLYCERIN 0.4 MG SL SUBL: 0.4000 mg | SUBLINGUAL_TABLET | SUBLINGUAL | 3 refills | Status: DC | PRN

## 2020-05-06 NOTE — Patient Instructions (Signed)
Medication Instructions:  Continue all current medications.   Labwork: none  Testing/Procedures: none  Follow-Up: 6 months   Any Other Special Instructions Will Be Listed Below (If Applicable).   If you need a refill on your cardiac medications before your next appointment, please call your pharmacy.  

## 2020-05-07 ENCOUNTER — Ambulatory Visit: Payer: Medicare Other | Admitting: Internal Medicine

## 2020-06-10 ENCOUNTER — Other Ambulatory Visit: Payer: Self-pay | Admitting: *Deleted

## 2020-06-10 ENCOUNTER — Telehealth: Payer: Self-pay

## 2020-06-10 DIAGNOSIS — J439 Emphysema, unspecified: Secondary | ICD-10-CM

## 2020-06-10 DIAGNOSIS — I10 Essential (primary) hypertension: Secondary | ICD-10-CM

## 2020-06-10 MED ORDER — ALBUTEROL SULFATE HFA 108 (90 BASE) MCG/ACT IN AERS: 2.0000 | INHALATION_SPRAY | Freq: Four times a day (QID) | RESPIRATORY_TRACT | 2 refills | Status: DC | PRN

## 2020-06-10 MED ORDER — ATENOLOL 50 MG PO TABS: 50.0000 mg | ORAL_TABLET | Freq: Every day | ORAL | 2 refills | Status: DC

## 2020-06-10 NOTE — Telephone Encounter (Signed)
Patient needs med refill  albuterol (PROVENTIL HFA;VENTOLIN HFA) 108 (90 Base) MCG/ACT inhaler  atenolol (TENORMIN) 50 MG tablet  Ph[facility]: [facility]

## 2020-06-10 NOTE — Telephone Encounter (Signed)
Pt medication sent to pharmacy  

## 2020-06-20 DIAGNOSIS — M5412 Radiculopathy, cervical region: Secondary | ICD-10-CM | POA: Diagnosis not present

## 2020-06-20 DIAGNOSIS — M19012 Primary osteoarthritis, left shoulder: Secondary | ICD-10-CM | POA: Diagnosis not present

## 2020-06-20 DIAGNOSIS — G8929 Other chronic pain: Secondary | ICD-10-CM | POA: Diagnosis not present

## 2020-06-20 DIAGNOSIS — M19011 Primary osteoarthritis, right shoulder: Secondary | ICD-10-CM | POA: Diagnosis not present

## 2020-07-01 ENCOUNTER — Other Ambulatory Visit: Payer: Self-pay | Admitting: Family Medicine

## 2020-07-01 ENCOUNTER — Other Ambulatory Visit: Payer: Self-pay | Admitting: *Deleted

## 2020-07-01 ENCOUNTER — Telehealth: Payer: Self-pay

## 2020-07-01 DIAGNOSIS — I10 Essential (primary) hypertension: Secondary | ICD-10-CM

## 2020-07-01 MED ORDER — AMLODIPINE BESYLATE 5 MG PO TABS: 5.0000 mg | ORAL_TABLET | Freq: Every day | ORAL | 3 refills | Status: DC

## 2020-07-01 NOTE — Telephone Encounter (Signed)
Medication sent to pharmacy  

## 2020-07-01 NOTE — Telephone Encounter (Signed)
Patient needs med refill Amlodipine 5 mg  Pharmacy: [facility]

## 2020-07-22 ENCOUNTER — Ambulatory Visit: Payer: Medicare Other | Admitting: Family Medicine

## 2020-07-30 ENCOUNTER — Other Ambulatory Visit: Payer: Self-pay

## 2020-07-30 ENCOUNTER — Telehealth: Payer: Self-pay

## 2020-07-30 DIAGNOSIS — I2510 Atherosclerotic heart disease of native coronary artery without angina pectoris: Secondary | ICD-10-CM

## 2020-07-30 MED ORDER — CLOPIDOGREL BISULFATE 75 MG PO TABS: 75.0000 mg | ORAL_TABLET | Freq: Every day | ORAL | 0 refills | Status: DC

## 2020-07-30 NOTE — Telephone Encounter (Signed)
Pt needs Clopidogrel called in to [facility]

## 2020-08-09 DIAGNOSIS — I2510 Atherosclerotic heart disease of native coronary artery without angina pectoris: Secondary | ICD-10-CM | POA: Diagnosis not present

## 2020-08-09 DIAGNOSIS — I10 Essential (primary) hypertension: Secondary | ICD-10-CM | POA: Diagnosis not present

## 2020-08-10 LAB — BASIC METABOLIC PANEL
BUN/Creatinine Ratio: 12 (ref 10–24)
BUN: 9 mg/dL (ref 8–27)
CO2: 21 mmol/L (ref 20–29)
Calcium: 9 mg/dL (ref 8.6–10.2)
Chloride: 103 mmol/L (ref 96–106)
Creatinine, Ser: 0.73 mg/dL — ABNORMAL LOW (ref 0.76–1.27)
Glucose: 92 mg/dL (ref 65–99)
Potassium: 4.2 mmol/L (ref 3.5–5.2)
Sodium: 142 mmol/L (ref 134–144)
eGFR: 100 mL/min/{1.73_m2} (ref >59)

## 2020-08-10 LAB — LIPID PANEL
Chol/HDL Ratio: 2.7 ratio (ref 0.0–5.0)
Cholesterol, Total: 156 mg/dL (ref 100–199)
HDL: 57 mg/dL (ref >39)
LDL Chol Calc (NIH): 75 mg/dL (ref 0–99)
Triglycerides: 142 mg/dL (ref 0–149)
VLDL Cholesterol Cal: 24 mg/dL (ref 5–40)

## 2020-08-14 DIAGNOSIS — M79641 Pain in right hand: Secondary | ICD-10-CM | POA: Diagnosis not present

## 2020-08-14 DIAGNOSIS — M47812 Spondylosis without myelopathy or radiculopathy, cervical region: Secondary | ICD-10-CM | POA: Diagnosis not present

## 2020-08-16 ENCOUNTER — Ambulatory Visit: Payer: Medicare Other | Admitting: Internal Medicine

## 2020-08-26 ENCOUNTER — Encounter: Payer: Self-pay | Admitting: Internal Medicine

## 2020-09-10 DIAGNOSIS — M47812 Spondylosis without myelopathy or radiculopathy, cervical region: Secondary | ICD-10-CM | POA: Diagnosis not present

## 2020-09-20 ENCOUNTER — Encounter: Payer: Self-pay | Admitting: Internal Medicine

## 2020-09-20 ENCOUNTER — Ambulatory Visit (INDEPENDENT_AMBULATORY_CARE_PROVIDER_SITE_OTHER): Payer: Medicare Other | Admitting: Internal Medicine

## 2020-09-20 ENCOUNTER — Other Ambulatory Visit: Payer: Self-pay

## 2020-09-20 VITALS — BP 119/75 | HR 63 | Temp 98.2°F | Resp 22 | Ht 66.0 in | Wt 178.0 lb

## 2020-09-20 DIAGNOSIS — J439 Emphysema, unspecified: Secondary | ICD-10-CM

## 2020-09-20 DIAGNOSIS — I10 Essential (primary) hypertension: Secondary | ICD-10-CM

## 2020-09-20 DIAGNOSIS — R911 Solitary pulmonary nodule: Secondary | ICD-10-CM | POA: Insufficient documentation

## 2020-09-20 DIAGNOSIS — F411 Generalized anxiety disorder: Secondary | ICD-10-CM

## 2020-09-20 DIAGNOSIS — Z122 Encounter for screening for malignant neoplasm of respiratory organs: Secondary | ICD-10-CM

## 2020-09-20 DIAGNOSIS — I2510 Atherosclerotic heart disease of native coronary artery without angina pectoris: Secondary | ICD-10-CM

## 2020-09-20 MED ORDER — UMECLIDINIUM-VILANTEROL 62.5-25 MCG/INH IN AEPB: 1.0000 | INHALATION_SPRAY | Freq: Every day | RESPIRATORY_TRACT | 11 refills | Status: DC

## 2020-09-20 MED ORDER — DIAZEPAM 5 MG PO TABS
5.0000 mg | ORAL_TABLET | Freq: Two times a day (BID) | ORAL | 2 refills | Status: DC | PRN
Start: 2020-09-20 — End: 2021-04-22

## 2020-09-20 NOTE — Assessment & Plan Note (Signed)
Multiple pul. Nodules noted in last CT chest (04/2019) Low dose CT chest for lung cancer screening

## 2020-09-20 NOTE — Assessment & Plan Note (Signed)
Well-controlled with Valium, takes 5 mg q12h PRN - although takes it QD most of the days. Has brought bottle - still has 8 tablets from previous prescription Refilled Valium

## 2020-09-20 NOTE — Progress Notes (Signed)
Established Patient Office Visit  Subjective:  Patient ID: Bill Johnson, male    DOB: 08/24/1955  Age: 65 y.o. MRN: [id number]  CC:  Chief Complaint  Patient presents with   Medication Refill    Follow up - needing refill on Anoro and Diazepam    COPD   Anxiety    HPI Bill Johnson is a 65 year old male with PMH of CAD s/p stent placement, hypertension, COPD, GERD, OA of shoulder, cervical spondylosis, anxiety and chronic knee pain who  presents for follow up of his chronic medical conditions.  COPD: Uses Anoro regularly. Rarely needs Albuterol. Has quit smoking. Denies any dyspnea or wheezing currently.  CAD: He had been hospitalized for episode of chest pain in 03/2020, and had cardiac cath, which showed patent stents. His Imdur dose has been increased and has not had chest pain recently. On DAPT and statin. Followed by Cardiology.  HTN: BP is well-controlled. Takes medications regularly. Patient denies headache, dizziness, chest pain, dyspnea or palpitations.  Anxiety: Well-controlled with Valium PRN. Has brought medication bottle from previous prescription. He does not take Valium everyday. Denies anhedonia, SI or HI.  He quit smoking in 06/2019 after stent placement.  He smoked for about 50 years. Last CT chest in 04/2019 showed multiple pulmonary nodules - less than 4 mm. Denies any hemoptysis, weight loss or night sweats.  Past Medical History:  Diagnosis Date   Alcohol abuse    Allergy    seasonal   Anxiety    CAD (coronary artery disease)    a. 06/2019 cath - s/p successful PCI of the ostial to mid LAD with IVUS guidance and DES x 2.   Depression    Essential hypertension    GERD (gastroesophageal reflux disease)    HOH (hard of hearing)    Hyperlipidemia    Hypertension    OA (osteoarthritis) of knee    Pulmonary nodules    a. seen on coronary CT 05/2019, will need OP f/u.   Rotator cuff tear arthropathy of both shoulders    On DISABILITY, non operative  case   Shoulder pain    Tobacco abuse    Vertigo     Past Surgical History:  Procedure Laterality Date   CARDIAC CATHETERIZATION  06/2005   no significant CAD   COLONOSCOPY WITH PROPOFOL N/A 05/01/2019   Procedure: COLONOSCOPY WITH PROPOFOL;  Surgeon: Daneil Dolin, MD;  [facility];  Service: Endoscopy;  Laterality: N/A;  7:30am   COLONOSCOPY WITH PROPOFOL N/A 03/07/2020   Procedure: COLONOSCOPY WITH PROPOFOL;  Surgeon: Daneil Dolin, MD;  [facility];  Service: Endoscopy;  Laterality: N/A;  8:30am   CORONARY STENT INTERVENTION N/A 07/05/2019   Procedure: CORONARY STENT INTERVENTION;  Surgeon: Martinique, Peter M, MD;  Location: [place] [facility];  Service: Cardiovascular;  Laterality: N/A;   CORONARY STENT INTERVENTION N/A 07/20/2019   Procedure: CORONARY STENT INTERVENTION;  Surgeon: Jettie Booze, MD;  Location: [place] [facility];  Service: Cardiovascular;  Laterality: N/A;   ESOPHAGOGASTRODUODENOSCOPY  08/2005   Dr. Henrene Pastor: small hiatal hernia   HERNIA REPAIR  1983   INTRAVASCULAR PRESSURE WIRE/FFR STUDY N/A 07/05/2019   Procedure: INTRAVASCULAR PRESSURE WIRE/FFR STUDY;  Surgeon: Martinique, Peter M, MD;  Location: [place] [facility];  Service: Cardiovascular;  Laterality: N/A;   INTRAVASCULAR ULTRASOUND/IVUS N/A 07/05/2019   Procedure: Intravascular Ultrasound/IVUS;  Surgeon: Martinique, Peter M, MD;  Location: [place] [facility];  Service: Cardiovascular;  Laterality: N/A;   LEFT HEART CATH AND CORONARY ANGIOGRAPHY N/A 07/05/2019   Procedure: LEFT HEART CATH AND CORONARY ANGIOGRAPHY;  Surgeon: Martinique, Peter M, MD;  Location: [place] [facility];  Service: Cardiovascular;  Laterality: N/A;   LEFT HEART CATH AND CORONARY ANGIOGRAPHY N/A 07/20/2019   Procedure: LEFT HEART CATH AND CORONARY ANGIOGRAPHY;  Surgeon: Jettie Booze, MD;  Location: [place] [facility];  Service: Cardiovascular;  Laterality: N/A;   LEFT HEART CATH AND CORONARY ANGIOGRAPHY N/A 04/24/2020    Procedure: LEFT HEART CATH AND CORONARY ANGIOGRAPHY;  Surgeon: Belva Crome, MD;  Location: [place] [facility];  Service: Cardiovascular;  Laterality: N/A;   POLYPECTOMY  05/01/2019   Procedure: POLYPECTOMY;  Surgeon: Daneil Dolin, MD;  [facility];  Service: Endoscopy;;   POLYPECTOMY  03/07/2020   Procedure: POLYPECTOMY;  Surgeon: Daneil Dolin, MD;  [facility];  Service: Endoscopy;;   ROTATOR CUFF REPAIR Bilateral    TONSILLECTOMY  1977    Family History  Problem Relation Age of Onset   Heart disease Mother    Diabetes Mother    Hypertension Mother    Heart disease Father    Hypertension Father    Stroke Other    Cancer Other    Cancer Brother        prostate   Colon cancer Neg Hx     Social History   Socioeconomic History   Marital status: Divorced    Spouse name: Not on file   Number of children: 2   Years of education: 12   Highest education level: Not on file  Occupational History   Occupation: Self-employed Designer, television/film set)  Tobacco Use   Smoking status: Former    Packs/day: 1.00    Years: 43.00    Pack years: 43.00    Types: Cigarettes    Start date: 04/15/1970    Quit date: 07/26/2020    Years since quitting: 0.1   Smokeless tobacco: Never  Vaping Use   Vaping Use: Never used  Substance and Sexual Activity   Alcohol use: Yes    Comment: Drinks 3-4 beers/day    Drug use: No   Sexual activity: Yes  Other Topics Concern   Not on file  Social History Narrative   Lives at home with nephew and niece in-law   Caffeine use: Drinks coffee/soda every day   Social Determinants of Radio broadcast assistant Strain: Not on file  Food Insecurity: Not on file  Transportation Needs: Not on file  Physical Activity: Not on file  Stress: Not on file  Social Connections: Not on file  Intimate Partner Violence: Not on file    Outpatient Medications Prior to Visit  Medication Sig Dispense Refill   acetaminophen (TYLENOL) 500 MG tablet  Take 500 mg by mouth as needed for mild pain or headache.      albuterol (VENTOLIN HFA) 108 (90 Base) MCG/ACT inhaler Inhale 2 puffs into the lungs every 6 (six) hours as needed for wheezing or shortness of breath. 1 each 2   amLODipine (NORVASC) 5 MG tablet Take 1 tablet (5 mg total) by mouth daily. 90 tablet 3   aspirin 81 MG tablet Take 81 mg by mouth daily.     atenolol (TENORMIN) 50 MG tablet Take 1 tablet (50 mg total) by mouth daily. 90 tablet 2   atorvastatin (LIPITOR) 80 MG tablet Take 1 tablet (80 mg total) by mouth every evening. 90 tablet 2  cetirizine (ZYRTEC) 10 MG tablet Take 10 mg by mouth daily.     clopidogrel (PLAVIX) 75 MG tablet Take 1 tablet (75 mg total) by mouth daily. 90 tablet 0   [employer] VITAMIN B-12 2000 MCG TBCR Take 2,000 mcg by mouth daily.     isosorbide mononitrate (IMDUR) 60 MG 24 hr tablet Take 1 tablet (60 mg total) by mouth daily. 90 tablet 3   meclizine (ANTIVERT) 25 MG tablet TAKE 1 TABLET BY MOUTH THREE TIMES DAILY AS NEEDED FOR DIZZINESS (Patient taking differently: Take 25 mg by mouth 3 (three) times daily as needed for dizziness.) 90 tablet 2   nitroGLYCERIN (NITROSTAT) 0.4 MG SL tablet Place 1 tablet (0.4 mg total) under the tongue every 5 (five) minutes as needed for chest pain (up to 3 doses). 25 tablet 3   pantoprazole (PROTONIX) 40 MG tablet Take 1 tablet (40 mg total) by mouth 2 (two) times daily. 180 tablet 2   diazepam (VALIUM) 5 MG tablet Take 1 tablet (5 mg total) by mouth every 12 (twelve) hours as needed for anxiety. 45 tablet 2   umeclidinium-vilanterol (ANORO ELLIPTA) 62.5-25 MCG/INH AEPB Inhale 1 puff into the lungs daily. 1 each 11   No facility-administered medications prior to visit.    No Known Allergies  ROS Review of Systems  Constitutional:  Negative for chills and fever.  HENT:  Negative for congestion and sore throat.   Eyes:  Negative for pain and discharge.  Respiratory:  Negative for cough and shortness of breath.    Cardiovascular:  Negative for chest pain and palpitations.  Gastrointestinal:  Negative for constipation, diarrhea, nausea and vomiting.  Endocrine: Negative for polydipsia and polyuria.  Genitourinary:  Negative for dysuria and hematuria.  Musculoskeletal:  Positive for arthralgias, back pain and neck pain. Negative for neck stiffness.  Skin:  Negative for rash.  Neurological:  Negative for dizziness, weakness, numbness and headaches.  Psychiatric/Behavioral:  Negative for agitation and behavioral problems.      Objective:    Physical Exam Vitals reviewed.  Constitutional:      General: He is not in acute distress.    Appearance: He is not diaphoretic.  HENT:     Head: Normocephalic and atraumatic.     Nose: Nose normal.     Mouth/Throat:     Mouth: Mucous membranes are moist.  Eyes:     General: No scleral icterus.    Extraocular Movements: Extraocular movements intact.     Pupils: Pupils are equal, round, and reactive to light.  Cardiovascular:     Rate and Rhythm: Normal rate and regular rhythm.     Pulses: Normal pulses.     Heart sounds: Normal heart sounds. No murmur heard. Pulmonary:     Breath sounds: Normal breath sounds. No wheezing or rales.  Abdominal:     Palpations: Abdomen is soft.     Tenderness: There is no abdominal tenderness.  Musculoskeletal:     Cervical back: Neck supple. No tenderness.     Right lower leg: No edema.     Left lower leg: No edema.  Skin:    General: Skin is warm.     Findings: No rash.  Neurological:     General: No focal deficit present.     Mental Status: He is alert and oriented to person, place, and time.     Sensory: No sensory deficit.     Motor: No weakness.  Psychiatric:        Mood  and Affect: Mood normal.        Behavior: Behavior normal.    BP 119/75 (BP Location: Right Arm, Patient Position: Sitting, Cuff Size: Large)   Pulse 63   Temp 98.2 F (36.8 C)   Resp (!) 22   Ht 5' 6" (1.676 m)   Wt 178 lb (80.7  kg)   SpO2 93%   BMI 28.73 kg/m  Wt Readings from Last 3 Encounters:  09/20/20 178 lb (80.7 kg)  05/06/20 175 lb 6.4 oz (79.6 kg)  04/25/20 167 lb 12.8 oz (76.1 kg)     Health Maintenance Due  Topic Date Due   Zoster Vaccines- Shingrix (1 of 2) Never done   COVID-19 Vaccine (4 - Booster for Moderna series) 06/20/2020   INFLUENZA VACCINE  08/26/2020    There are no preventive care reminders to display for this patient.  Lab Results  Component Value Date   TSH 0.774 11/28/2014   Lab Results  Component Value Date   WBC 10.2 04/25/2020   HGB 12.9 (L) 04/25/2020   HCT 39.3 04/25/2020   MCV 101.3 (H) 04/25/2020   PLT 158 04/25/2020   Lab Results  Component Value Date   NA 142 08/09/2020   K 4.2 08/09/2020   CO2 21 08/09/2020   GLUCOSE 92 08/09/2020   BUN 9 08/09/2020   CREATININE 0.73 (L) 08/09/2020   BILITOT 0.7 12/01/2019   ALKPHOS 45 03/18/2019   AST 25 12/01/2019   ALT 20 12/01/2019   PROT 7.2 12/01/2019   ALBUMIN 3.1 (L) 03/18/2019   CALCIUM 9.0 08/09/2020   ANIONGAP 7 04/25/2020   EGFR 100 08/09/2020   Lab Results  Component Value Date   CHOL 156 08/09/2020   Lab Results  Component Value Date   HDL 57 08/09/2020   Lab Results  Component Value Date   LDLCALC 75 08/09/2020   Lab Results  Component Value Date   TRIG 142 08/09/2020   Lab Results  Component Value Date   CHOLHDL 2.7 08/09/2020   Lab Results  Component Value Date   HGBA1C 5.7 (H) 04/24/2020      Assessment & Plan:   Problem List Items Addressed This Visit       Cardiovascular and Mediastinum   Essential hypertension    BP Readings from Last 1 Encounters:  09/20/20 119/75  Well-controlled with Amlodipine, Atenolol and Imdur Counseled for compliance with the medications Advised DASH diet and moderate exercise/walking, at least 150 mins/week      CAD, multiple vessel    S/p stent placement in 06/2019 On DAPT and statin Follows up with Cardiologist         Respiratory   COPD (chronic obstructive pulmonary disease) ([place]) - Primary    Well-controlled with Anoro - refilled Albuterol PRN.      Relevant Medications   umeclidinium-vilanterol (ANORO ELLIPTA) 62.5-25 MCG/INH AEPB     Other   GAD (generalized anxiety disorder)    Well-controlled with Valium, takes 5 mg q12h PRN - although takes it QD most of the days. Has brought bottle - still has 8 tablets from previous prescription Refilled Valium      Relevant Medications   diazepam (VALIUM) 5 MG tablet   Pulmonary nodule    Multiple pul. Nodules noted in last CT chest (04/2019) Low dose CT chest for lung cancer screening      Relevant Orders   CT CHEST LUNG CA SCREEN LOW DOSE W/O CM   Other Visit Diagnoses  Screening for lung cancer       Relevant Orders   CT CHEST LUNG CA SCREEN LOW DOSE W/O CM       Meds ordered this encounter  Medications   umeclidinium-vilanterol (ANORO ELLIPTA) 62.5-25 MCG/INH AEPB    Sig: Inhale 1 puff into the lungs daily.    Dispense:  1 each    Refill:  11   diazepam (VALIUM) 5 MG tablet    Sig: Take 1 tablet (5 mg total) by mouth every 12 (twelve) hours as needed for anxiety.    Dispense:  45 tablet    Refill:  2    Follow-up: Return in about 3 months (around 12/21/2020) for Annual physical.    Lindell Spar, MD

## 2020-09-20 NOTE — Assessment & Plan Note (Signed)
BP Readings from Last 1 Encounters:  09/20/20 119/75   Well-controlled with Amlodipine, Atenolol and Imdur Counseled for compliance with the medications Advised DASH diet and moderate exercise/walking, at least 150 mins/week

## 2020-09-20 NOTE — Assessment & Plan Note (Signed)
S/p stent placement in 06/2019 On DAPT and statin Follows up with Cardiologist

## 2020-09-20 NOTE — Patient Instructions (Signed)
Please continue taking medications as prescribed.  Please use Anoro regularly and Albuterol only as needed for shortness of breath or wheezing.  You are being scheduled to get CT chest low dose for lung cancer screening.

## 2020-09-20 NOTE — Assessment & Plan Note (Signed)
Well-controlled with Anoro - refilled Albuterol PRN.

## 2020-10-02 DIAGNOSIS — M47892 Other spondylosis, cervical region: Secondary | ICD-10-CM | POA: Diagnosis not present

## 2020-10-02 DIAGNOSIS — I10 Essential (primary) hypertension: Secondary | ICD-10-CM | POA: Diagnosis not present

## 2020-10-02 DIAGNOSIS — M47812 Spondylosis without myelopathy or radiculopathy, cervical region: Secondary | ICD-10-CM | POA: Diagnosis not present

## 2020-10-02 DIAGNOSIS — M79641 Pain in right hand: Secondary | ICD-10-CM | POA: Diagnosis not present

## 2020-10-04 DIAGNOSIS — G5603 Carpal tunnel syndrome, bilateral upper limbs: Secondary | ICD-10-CM | POA: Diagnosis not present

## 2020-10-17 DIAGNOSIS — G5603 Carpal tunnel syndrome, bilateral upper limbs: Secondary | ICD-10-CM | POA: Diagnosis not present

## 2020-10-17 DIAGNOSIS — G5621 Lesion of ulnar nerve, right upper limb: Secondary | ICD-10-CM | POA: Diagnosis not present

## 2020-10-21 ENCOUNTER — Ambulatory Visit (HOSPITAL_COMMUNITY)
Admission: RE | Admit: 2020-10-21 | Discharge: 2020-10-21 | Disposition: A | Discharge status: Home / Self Care | Payer: Medicare Other | Source: Ambulatory Visit | Attending: Internal Medicine | Admitting: Internal Medicine

## 2020-10-21 ENCOUNTER — Other Ambulatory Visit: Payer: Self-pay

## 2020-10-21 DIAGNOSIS — R911 Solitary pulmonary nodule: Secondary | ICD-10-CM | POA: Diagnosis not present

## 2020-10-21 DIAGNOSIS — Z122 Encounter for screening for malignant neoplasm of respiratory organs: Secondary | ICD-10-CM | POA: Diagnosis not present

## 2020-10-21 DIAGNOSIS — Z87891 Personal history of nicotine dependence: Secondary | ICD-10-CM | POA: Insufficient documentation

## 2020-10-23 ENCOUNTER — Encounter: Payer: Self-pay | Admitting: Internal Medicine

## 2020-10-23 DIAGNOSIS — I700 Atherosclerosis of aorta: Secondary | ICD-10-CM | POA: Insufficient documentation

## 2020-10-28 ENCOUNTER — Telehealth: Payer: Self-pay

## 2020-10-28 ENCOUNTER — Other Ambulatory Visit: Payer: Self-pay | Admitting: *Deleted

## 2020-10-28 DIAGNOSIS — I2510 Atherosclerotic heart disease of native coronary artery without angina pectoris: Secondary | ICD-10-CM

## 2020-10-28 MED ORDER — CLOPIDOGREL BISULFATE 75 MG PO TABS: 75.0000 mg | ORAL_TABLET | Freq: Every day | ORAL | 0 refills | Status: DC

## 2020-10-28 NOTE — Telephone Encounter (Signed)
Patient need med refill  clopidogrel (PLAVIX) 75 MG tablet   Pharm[facility]: [facility]

## 2020-10-28 NOTE — Telephone Encounter (Signed)
Pt medication sent to pharmacy  

## 2020-10-29 DIAGNOSIS — G5603 Carpal tunnel syndrome, bilateral upper limbs: Secondary | ICD-10-CM | POA: Diagnosis not present

## 2020-11-03 NOTE — Progress Notes (Deleted)
Cardiology Office Note  Date: 11/03/2020   ID: Bill Johnson, DOB [birth year], MRN [id number]  PCP:  Lindell Spar, MD  Cardiologist:  None Electrophysiologist:  None   Chief Complaint:*  6 months - est new prim card, prev sk  History of Present Illness: Bill Johnson is a 65 y.o. male with a history of chest pain, smoking, GERD, HLD, alcohol abuse.  Recently woke up on 04/23/2020 with chest pain that was continuous and persisted throughout the morning.  Seemed to worsen periodically.  No radiation or associated nausea, dyspnea, diaphoresis.  He was given NTG en route to [facility] ED with improvement in symptoms and received a subsequent NTG in ED with relief. He was transferred to [facility] for further evaluation. Underwent cardiac catheterization demonstrating widely patent prior stents with mild ISR to proximal LAD. Non cardiac etiology for CP was suggested.  Troponins were negative.  To remain on ASA, Plavix, Imdur, Lipitor.  Had stopped alcohol and smoking. Lung nodules on CT 2021. Advised would need repeat CT with PCP.  He denies any chest pain since having cardiac catheterization.  States that this is Imdur was increased to 60 mg has had no more chest pain.  Denies any exertional symptoms, palpitations or arrhythmias, orthostatic symptoms, CVA or TIA-like symptoms, bleeding.  Denies any claudication-like symptoms, DVT or PE-like symptoms, lower extremity edema.  He states today he has stopped smoking and alcohol.  States he still becomes short of breath.  He has COPD from long-term smoking.  He states he knows he has some spots on his lungs and will need follow-up for those.  Past Medical History:  Diagnosis Date   Alcohol abuse    Allergy    seasonal   Anxiety    CAD (coronary artery disease)    a. 06/2019 cath - s/p successful PCI of the ostial to mid LAD with IVUS guidance and DES x 2.   Depression    Essential hypertension    GERD (gastroesophageal reflux disease)    HOH  (hard of hearing)    Hyperlipidemia    Hypertension    OA (osteoarthritis) of knee    Pulmonary nodules    a. seen on coronary CT 05/2019, will need OP f/u.   Rotator cuff tear arthropathy of both shoulders    On DISABILITY, non operative case   Shoulder pain    Tobacco abuse    Vertigo     Past Surgical History:  Procedure Laterality Date   CARDIAC CATHETERIZATION  06/2005   no significant CAD   COLONOSCOPY WITH PROPOFOL N/A 05/01/2019   Procedure: COLONOSCOPY WITH PROPOFOL;  Surgeon: Daneil Dolin, MD;  [facility];  Service: Endoscopy;  Laterality: N/A;  7:30am   COLONOSCOPY WITH PROPOFOL N/A 03/07/2020   Procedure: COLONOSCOPY WITH PROPOFOL;  Surgeon: Daneil Dolin, MD;  Location: [facility];  Service: Endoscopy;  Laterality: N/A;  8:30am   CORONARY STENT INTERVENTION N/A 07/05/2019   Procedure: CORONARY STENT INTERVENTION;  Surgeon: Martinique, Peter M, MD;  Location: [place] [facility];  Service: Cardiovascular;  Laterality: N/A;   CORONARY STENT INTERVENTION N/A 07/20/2019   Procedure: CORONARY STENT INTERVENTION;  Surgeon: Jettie Booze, MD;  Location: [place] [facility];  Service: Cardiovascular;  Laterality: N/A;   ESOPHAGOGASTRODUODENOSCOPY  08/2005   Dr. Henrene Pastor: small hiatal hernia   HERNIA REPAIR  1983   INTRAVASCULAR PRESSURE WIRE/FFR STUDY N/A 07/05/2019   Procedure: INTRAVASCULAR PRESSURE WIRE/FFR STUDY;  Surgeon: Martinique, Peter M, MD;  Location: [place] [facility];  Service: Cardiovascular;  Laterality: N/A;   INTRAVASCULAR ULTRASOUND/IVUS N/A 07/05/2019   Procedure: Intravascular Ultrasound/IVUS;  Surgeon: Martinique, Peter M, MD;  Location: [place] [facility];  Service: Cardiovascular;  Laterality: N/A;   LEFT HEART CATH AND CORONARY ANGIOGRAPHY N/A 07/05/2019   Procedure: LEFT HEART CATH AND CORONARY ANGIOGRAPHY;  Surgeon: Martinique, Peter M, MD;  Location: [place] [facility];  Service: Cardiovascular;  Laterality: N/A;   LEFT HEART CATH AND CORONARY ANGIOGRAPHY  N/A 07/20/2019   Procedure: LEFT HEART CATH AND CORONARY ANGIOGRAPHY;  Surgeon: Jettie Booze, MD;  Location: [place] [facility];  Service: Cardiovascular;  Laterality: N/A;   LEFT HEART CATH AND CORONARY ANGIOGRAPHY N/A 04/24/2020   Procedure: LEFT HEART CATH AND CORONARY ANGIOGRAPHY;  Surgeon: Belva Crome, MD;  Location: [place] [facility];  Service: Cardiovascular;  Laterality: N/A;   POLYPECTOMY  05/01/2019   Procedure: POLYPECTOMY;  Surgeon: Daneil Dolin, MD;  [facility];  Service: Endoscopy;;   POLYPECTOMY  03/07/2020   Procedure: POLYPECTOMY;  Surgeon: Daneil Dolin, MD;  [facility];  Service: Endoscopy;;   ROTATOR CUFF REPAIR Bilateral    TONSILLECTOMY  1977    Current Outpatient Medications  Medication Sig Dispense Refill   acetaminophen (TYLENOL) 500 MG tablet Take 500 mg by mouth as needed for mild pain or headache.      albuterol (VENTOLIN HFA) 108 (90 Base) MCG/ACT inhaler Inhale 2 puffs into the lungs every 6 (six) hours as needed for wheezing or shortness of breath. 1 each 2   amLODipine (NORVASC) 5 MG tablet Take 1 tablet (5 mg total) by mouth daily. 90 tablet 3   aspirin 81 MG tablet Take 81 mg by mouth daily.     atenolol (TENORMIN) 50 MG tablet Take 1 tablet (50 mg total) by mouth daily. 90 tablet 2   atorvastatin (LIPITOR) 80 MG tablet Take 1 tablet (80 mg total) by mouth every evening. 90 tablet 2   cetirizine (ZYRTEC) 10 MG tablet Take 10 mg by mouth daily.     clopidogrel (PLAVIX) 75 MG tablet Take 1 tablet (75 mg total) by mouth daily. 90 tablet 0   [employer] VITAMIN B-12 2000 MCG TBCR Take 2,000 mcg by mouth daily.     diazepam (VALIUM) 5 MG tablet Take 1 tablet (5 mg total) by mouth every 12 (twelve) hours as needed for anxiety. 45 tablet 2   isosorbide mononitrate (IMDUR) 60 MG 24 hr tablet Take 1 tablet (60 mg total) by mouth daily. 90 tablet 3   meclizine (ANTIVERT) 25 MG tablet TAKE 1 TABLET BY MOUTH THREE TIMES DAILY AS NEEDED FOR  DIZZINESS (Patient taking differently: Take 25 mg by mouth 3 (three) times daily as needed for dizziness.) 90 tablet 2   nitroGLYCERIN (NITROSTAT) 0.4 MG SL tablet Place 1 tablet (0.4 mg total) under the tongue every 5 (five) minutes as needed for chest pain (up to 3 doses). 25 tablet 3   pantoprazole (PROTONIX) 40 MG tablet Take 1 tablet (40 mg total) by mouth 2 (two) times daily. 180 tablet 2   umeclidinium-vilanterol (ANORO ELLIPTA) 62.5-25 MCG/INH AEPB Inhale 1 puff into the lungs daily. 1 each 11   No current facility-administered medications for this visit.   Allergies:  Patient has no known allergies.   Social History: The patient  reports that he quit smoking about 3 months ago. His smoking use included  cigarettes. He started smoking about 50 years ago. He has a 43.00 pack-year smoking history. He has never used smokeless tobacco. He reports current alcohol use. He reports that he does not use drugs.   Family History: The patient's family history includes Cancer in his brother and another family member; Diabetes in his mother; Heart disease in his father and mother; Hypertension in his father and mother; Stroke in an other family member.   ROS:  Please see the history of present illness. Otherwise, complete review of systems is positive for none.  All other systems are reviewed and negative.   Physical Exam: VS:  There were no vitals taken for this visit., BMI There is no height or weight on file to calculate BMI.  Wt Readings from Last 3 Encounters:  09/20/20 178 lb (80.7 kg)  05/06/20 175 lb 6.4 oz (79.6 kg)  04/25/20 167 lb 12.8 oz (76.1 kg)    General: Patient appears comfortable at rest. Neck: Supple, no elevated JVP or carotid bruits, no thyromegaly. Lungs: Clear to auscultation, nonlabored breathing at rest. Cardiac: Regular rate and rhythm, no S3 or significant systolic murmur, no pericardial rub. Extremities: No pitting edema, distal pulses 2+. Skin: Warm and dry.  Right  radial clean and dry with 2+ pulse Musculoskeletal: No kyphosis. Neuropsychiatric: Alert and oriented x3, affect grossly appropriate.  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  The vessel is very small in size and contains luminal irregularities. The right coronary is dominant.  The PDA arises from the distal portion of the mid vessel.  The origin of the PDA contains a 50 to 60% eccentric narrowing. Left main is widely patent. The left main gives origin to the LAD and what should be considered a ramus intermedius that then bifurcates.  The superior most branch of the ramus bifurcation contains a patent stent. The LAD is a large vessel and contains an ostial to mid vessel stent the jails a diagonal distally.  The diagonal contains ostial 50 to 70% narrowing. Normal left ventricular function.  LVEF 55%.   RECOMMENDATIONS:   The previously placed stents are widely patent. Ongoing chest pain without enzyme abnormality or EKG changes suggests the possibility of an alternative explanation. Per treating team  Diagnostic Dominance: Right        Echocardiogram: 02/2019 IMPRESSIONS   1. Left ventricular ejection fraction, by estimation, is 55 to 60%. The  left ventricle has normal function. The left ventricle has no regional  wall motion abnormalities. Left ventricular  diastolic parameters were  normal.   2. Right ventricular systolic function is normal. The right ventricular  size is normal. Tricuspid regurgitation signal is inadequate for assessing  PA pressure.   3. Left atrial size was upper normal.   4. Right atrial size was upper normal.   5. The mitral valve  is grossly normal. Trivial mitral valve  regurgitation.   6. The aortic valve is tricuspid. Aortic valve regurgitation is mild.   7. The inferior vena cava is normal in size with greater than 50%  respiratory variability, suggesting right atrial pressure of 3 mmHg  Cardiac catheterization 07/05/2019  Prox LAD to Mid LAD lesion is 70% stenosed. A drug-eluting stent was successfully placed using a STENT RESOLUTE ONYX 3.0X22. Post intervention, there is a 0% residual stenosis. Mid LAD lesion is 50% stenosed. A drug-eluting stent was successfully placed using a STENT RESOLUTE ONYX 2.5X30. Post intervention, there is a 0% residual stenosis. 1st Mrg lesion is 95% stenosed. The left ventricular systolic function is normal. LV end diastolic pressure is normal. The left ventricular ejection fraction is 55-65% by visual estimate.   1. Severe 2 vessel obstructive CAD. Abnormal FFR of the LAD. 2. Normal LV function 3. Normal LVEDP 4. Successful PCI of the ostial to mid LAD with IVUS guidance and DES x 2.    Plan: DAPT for one year. Optimize medical therapy. Smoking cessation. If patient continues to have symptoms despite optimal therapy could consider PCI of the OM but this is a small branch and supplies a relatively small area of myocardium.   Diagnostic Dominance: Right   Intervention      Cardiac catheterization 07/20/2019   Previously placed Mid LAD drug eluting stent is widely patent. Previously placed Prox LAD to Mid LAD drug eluting stent is widely patent. 1st Mrg lesion is 95% stenosed. A drug-eluting stent was successfully placed using a STENT RESOLUTE ONYX 2.25X26, postdilated  to 2.6 mm. Post intervention, there is a 0% residual stenosis. The left ventricular systolic function is normal. LV end diastolic pressure is normal. The left ventricular ejection fraction is 55-65% by visual estimate. There is no aortic valve stenosis.   Continue aggressive secondary prevention.  Consider clopidogrel monotherapy after 12 months.    Watch overnight.  Home tomorrow.    He is interested in getting the COVID-19 vaccine.  I encouraged him to get the vaccines when possible.   Diagnostic Dominance: Right  Intervention        Assessment and Plan:   1. CAD in native artery Status post cardiac catheterization on 04/24/2020 for chest pain. 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  Vessel is very small in size with luminal irregularities.  RCA is dominant.  PDA arises from the distal portion of the mid vessel.  Origin of PDA contains 50 to 60% eccentric narrowing.  Left main widely patent.  Left main is artery to LAD and which should be considered a ramus intermedius that then bifurcates.  The superiormost branch of the ramus bifurcation contains patent stent.  LAD is a large vessel and contains ostial mid vessel stent jailed the diagonal distally.  Diagonal contains 50 to 70% narrowing.  EF 55%.  Patient denies any current chest pain.  He states the increase in Imdur to 60 mg seems to have relieved his chest pain.  Continue aspirin 81 mg daily.  Continue Imdur 60 mg daily.  Continue sublingual nitroglycerin as needed.  Please refill sublingual nitroglycerin.  Patient states his has expired  2. Essential hypertension Blood pressure is well controlled on current therapy.  Blood pressure today 124/72.  Heart rate of 81.  Continue amlodipine 5 mg daily, atenolol 50 mg daily  3. Mixed hyperlipidemia Continue atorvastatin 80 mg daily.  Lipid panel on 04/25/2019 demonstrated total cholesterol 155, triglycerides 203, HDL 49,  LDL 65.   4. Tobacco  abuse Patient states he is not smoking anymore.  Encouraged him to continue with cessation  Medication Adjustments/Labs and Tests Ordered: Current medicines are reviewed at length with the patient today.  Concerns regarding medicines are outlined above.   Disposition: Follow-up with Dr. Harl Bowie or [facility] 6 months  Signed, [name] July, NP 11/03/2020 9:38 PM    [facility] [place], [place], [place] [address] Phone: [phone]; Fax: [phone]

## 2020-11-04 ENCOUNTER — Ambulatory Visit: Payer: Medicare Other | Admitting: Family Medicine

## 2020-11-04 DIAGNOSIS — I2510 Atherosclerotic heart disease of native coronary artery without angina pectoris: Secondary | ICD-10-CM

## 2020-11-04 DIAGNOSIS — E782 Mixed hyperlipidemia: Secondary | ICD-10-CM

## 2020-11-04 DIAGNOSIS — I10 Essential (primary) hypertension: Secondary | ICD-10-CM

## 2020-11-04 DIAGNOSIS — Z720 Tobacco use: Secondary | ICD-10-CM

## 2020-11-06 DIAGNOSIS — M25561 Pain in right knee: Secondary | ICD-10-CM | POA: Diagnosis not present

## 2020-11-06 DIAGNOSIS — R200 Anesthesia of skin: Secondary | ICD-10-CM | POA: Diagnosis not present

## 2020-11-06 DIAGNOSIS — R202 Paresthesia of skin: Secondary | ICD-10-CM | POA: Diagnosis not present

## 2020-11-06 DIAGNOSIS — M1711 Unilateral primary osteoarthritis, right knee: Secondary | ICD-10-CM | POA: Diagnosis not present

## 2020-11-06 DIAGNOSIS — G5603 Carpal tunnel syndrome, bilateral upper limbs: Secondary | ICD-10-CM | POA: Diagnosis not present

## 2020-11-07 ENCOUNTER — Telehealth: Payer: Self-pay | Admitting: Internal Medicine

## 2020-11-07 NOTE — Telephone Encounter (Signed)
Pt came by office needing refills on Meclizine 25 MG

## 2020-11-11 ENCOUNTER — Telehealth: Payer: Self-pay | Admitting: Internal Medicine

## 2020-11-11 ENCOUNTER — Other Ambulatory Visit: Payer: Self-pay | Admitting: *Deleted

## 2020-11-11 MED ORDER — MECLIZINE HCL 25 MG PO TABS: ORAL_TABLET | ORAL | 2 refills | Status: DC

## 2020-11-11 NOTE — Telephone Encounter (Signed)
Pt medication sent to [facility]

## 2020-11-11 NOTE — Telephone Encounter (Signed)
Pt stopped by to get a refill on meclizine (ANTIVERT) 25 MG tablet

## 2020-11-11 NOTE — Progress Notes (Signed)
Cardiology Office Note  Date: 11/12/2020   ID: KINGSTYN DERUITER, DOB 1955-05-22, MRN [id number]  PCP:  Lindell Spar, MD  Cardiologist:  None Electrophysiologist:  None   Chief Complaint:*  6 months - est new prim card, prev sk  History of Present Illness: Denise L Cofer is a 65 y.o. male with a history of chest pain, smoking, GERD, HLD, alcohol abuse.  Recently woke up on 04/23/2020 with chest pain that was continuous and persisted throughout the morning.  Seemed to worsen periodically.  No radiation or associated nausea, dyspnea, diaphoresis.  He was given NTG en route to [facility] ED with improvement in symptoms and received a subsequent NTG in ED with relief. He was transferred to [facility] for further evaluation. Underwent cardiac catheterization demonstrating widely patent prior stents with mild ISR to proximal LAD. Non cardiac etiology for CP was suggested.  Troponins were negative.  To remain on ASA, Plavix, Imdur, Lipitor.  Had stopped alcohol and smoking. Lung nodules on CT 2021. Advised would need repeat CT with PCP.  Last visit he denied any chest pain since having cardiac catheterization.  Since Imdur was increased to 60 mg  had no more chest pain.  Denied any exertional symptoms, palpitations or arrhythmias, orthostatic symptoms, CVA or TIA-like symptoms, bleeding.  No claudication-like symptoms, DVT or PE-like symptoms, lower extremity edema.  He states today he has stopped smoking and alcohol.  Still had chronic shortness of breath.  He has COPD from long-term smoking.    He is here today for 33-month follow-up.  He states he has been doing well.  He has been gaining weight.  States he has been eating too much since stopping smoking.  States he probably eats too much of the wrong times.  He mentions liking potato chips and other sweets and salty items.  Blood pressure is well controlled.  States he has some minor discomfort when doing more than usual ADLs such as mowing the yard  or weed eating.  Otherwise he has no complaints of chest pain.  He had a recent lipid panel on 08/09/2020 demonstrating total cholesterol 156, triglycerides 142, HDL 57, LDL 75.  We discussed goal for LDL of less than 70.  We discussed addition of Zetia.  He has not started back smoking.   Past Medical History:  Diagnosis Date   Alcohol abuse    Allergy    seasonal   Anxiety    CAD (coronary artery disease)    a. 06/2019 cath - s/p successful PCI of the ostial to mid LAD with IVUS guidance and DES x 2.   Depression    Essential hypertension    GERD (gastroesophageal reflux disease)    HOH (hard of hearing)    Hyperlipidemia    Hypertension    OA (osteoarthritis) of knee    Pulmonary nodules    a. seen on coronary CT 05/2019, will need OP f/u.   Rotator cuff tear arthropathy of both shoulders    On DISABILITY, non operative case   Shoulder pain    Tobacco abuse    Vertigo     Past Surgical History:  Procedure Laterality Date   CARDIAC CATHETERIZATION  06/2005   no significant CAD   COLONOSCOPY WITH PROPOFOL N/A 05/01/2019   Procedure: COLONOSCOPY WITH PROPOFOL;  Surgeon: Daneil Dolin, MD;  Location: [facility];  Service: Endoscopy;  Laterality: N/A;  7:30am   COLONOSCOPY WITH PROPOFOL N/A 03/07/2020   Procedure: COLONOSCOPY WITH  PROPOFOL;  Surgeon: Daneil Dolin, MD;  Location[facility];  Service: Endoscopy;  Laterality: N/A;  8:30am   CORONARY STENT INTERVENTION N/A 07/05/2019   Procedure: CORONARY STENT INTERVENTION;  Surgeon: Martinique, Peter M, MD;  Location: [place] [facility];  Service: Cardiovascular;  Laterality: N/A;   CORONARY STENT INTERVENTION N/A 07/20/2019   Procedure: CORONARY STENT INTERVENTION;  Surgeon: Jettie Booze, MD;  Location: [place] [facility];  Service: Cardiovascular;  Laterality: N/A;   ESOPHAGOGASTRODUODENOSCOPY  08/2005   Dr. Henrene Pastor: small hiatal hernia   HERNIA REPAIR  1983   INTRAVASCULAR PRESSURE WIRE/FFR STUDY N/A 07/05/2019    Procedure: INTRAVASCULAR PRESSURE WIRE/FFR STUDY;  Surgeon: Martinique, Peter M, MD;  Location: [place] [facility];  Service: Cardiovascular;  Laterality: N/A;   INTRAVASCULAR ULTRASOUND/IVUS N/A 07/05/2019   Procedure: Intravascular Ultrasound/IVUS;  Surgeon: Martinique, Peter M, MD;  Location: [place] [facility];  Service: Cardiovascular;  Laterality: N/A;   LEFT HEART CATH AND CORONARY ANGIOGRAPHY N/A 07/05/2019   Procedure: LEFT HEART CATH AND CORONARY ANGIOGRAPHY;  Surgeon: Martinique, Peter M, MD;  Location: [place] [facility];  Service: Cardiovascular;  Laterality: N/A;   LEFT HEART CATH AND CORONARY ANGIOGRAPHY N/A 07/20/2019   Procedure: LEFT HEART CATH AND CORONARY ANGIOGRAPHY;  Surgeon: Jettie Booze, MD;  Location: [place] [facility];  Service: Cardiovascular;  Laterality: N/A;   LEFT HEART CATH AND CORONARY ANGIOGRAPHY N/A 04/24/2020   Procedure: LEFT HEART CATH AND CORONARY ANGIOGRAPHY;  Surgeon: Belva Crome, MD;  Location: [place] [facility];  Service: Cardiovascular;  Laterality: N/A;   POLYPECTOMY  05/01/2019   Procedure: POLYPECTOMY;  Surgeon: Daneil Dolin, MD;  [facility];  Service: Endoscopy;;   POLYPECTOMY  03/07/2020   Procedure: POLYPECTOMY;  Surgeon: Daneil Dolin, MD;  [facility];  Service: Endoscopy;;   ROTATOR CUFF REPAIR Bilateral    TONSILLECTOMY  1977    Current Outpatient Medications  Medication Sig Dispense Refill   acetaminophen (TYLENOL) 500 MG tablet Take 500 mg by mouth as needed for mild pain or headache.      albuterol (VENTOLIN HFA) 108 (90 Base) MCG/ACT inhaler Inhale 2 puffs into the lungs every 6 (six) hours as needed for wheezing or shortness of breath. 1 each 2   amLODipine (NORVASC) 5 MG tablet Take 1 tablet (5 mg total) by mouth daily. 90 tablet 3   aspirin 81 MG tablet Take 81 mg by mouth daily.     atenolol (TENORMIN) 50 MG tablet Take 1 tablet (50 mg total) by mouth daily. 90 tablet 2   atorvastatin (LIPITOR) 80 MG tablet  Take 1 tablet (80 mg total) by mouth every evening. 90 tablet 2   cetirizine (ZYRTEC) 10 MG tablet Take 10 mg by mouth daily.     clopidogrel (PLAVIX) 75 MG tablet Take 1 tablet (75 mg total) by mouth daily. 90 tablet 0   [employer] VITAMIN B-12 2000 MCG TBCR Take 2,000 mcg by mouth daily.     diazepam (VALIUM) 5 MG tablet Take 1 tablet (5 mg total) by mouth every 12 (twelve) hours as needed for anxiety. 45 tablet 2   isosorbide mononitrate (IMDUR) 60 MG 24 hr tablet Take 1 tablet (60 mg total) by mouth daily. 90 tablet 3   meclizine (ANTIVERT) 25 MG tablet TAKE 1 TABLET BY MOUTH THREE TIMES DAILY AS NEEDED FOR DIZZINESS 90 tablet 2   nitroGLYCERIN (NITROSTAT) 0.4 MG SL tablet Place 1 tablet (  0.4 mg total) under the tongue every 5 (five) minutes as needed for chest pain (up to 3 doses). 25 tablet 3   pantoprazole (PROTONIX) 40 MG tablet Take 1 tablet (40 mg total) by mouth 2 (two) times daily. 180 tablet 2   umeclidinium-vilanterol (ANORO ELLIPTA) 62.5-25 MCG/INH AEPB Inhale 1 puff into the lungs daily. 1 each 11   No current facility-administered medications for this visit.   Allergies:  Patient has no known allergies.   Social History: The patient  reports that he quit smoking about 3 months ago. His smoking use included cigarettes. He started smoking about 50 years ago. He has a 43.00 pack-year smoking history. He has never used smokeless tobacco. He reports current alcohol use. He reports that he does not use drugs.   Family History: The patient's family history includes Cancer in his brother and another family member; Diabetes in his mother; Heart disease in his father and mother; Hypertension in his father and mother; Stroke in an other family member.   ROS:  Please see the history of present illness. Otherwise, complete review of systems is positive for none.  All other systems are reviewed and negative.   Physical Exam: VS:  BP 124/70   Pulse 68   Ht 5\' 4"  (1.626 m)   Wt 183 lb 12.8 oz  (83.4 kg)   SpO2 98%   BMI 31.55 kg/m , BMI Body mass index is 31.55 kg/m.  Wt Readings from Last 3 Encounters:  11/12/20 183 lb 12.8 oz (83.4 kg)  09/20/20 178 lb (80.7 kg)  05/06/20 175 lb 6.4 oz (79.6 kg)    General: Patient appears comfortable at rest. Neck: Supple, no elevated JVP or carotid bruits, no thyromegaly. Lungs: Clear to auscultation, nonlabored breathing at rest. Cardiac: Regular rate and rhythm, no S3 or significant systolic murmur, no pericardial rub. Extremities: No pitting edema, distal pulses 2+. Skin: Warm and dry.  Right radial clean and dry with 2+ pulse Musculoskeletal: No kyphosis. Neuropsychiatric: Alert and oriented x3, affect grossly appropriate.  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  The vessel is very small in size and contains luminal irregularities. The right coronary is dominant.  The PDA arises from the distal portion of the mid vessel.  The origin of the PDA contains a 50 to 60% eccentric narrowing. Left main is widely patent. The left main gives origin to the LAD and what should be considered a ramus intermedius that then bifurcates.  The superior most branch of the ramus bifurcation contains a patent stent. The LAD is a large vessel and contains an ostial to mid vessel stent the jails a diagonal distally.  The diagonal contains  ostial 50 to 70% narrowing. Normal left ventricular function.  LVEF 55%.   RECOMMENDATIONS:   The previously placed stents are widely patent. Ongoing  chest pain without enzyme abnormality or EKG changes suggests the possibility of an alternative explanation. Per treating team  Diagnostic Dominance: Right        Echocardiogram: 02/2019 IMPRESSIONS   1. Left ventricular ejection fraction, by estimation, is 55 to 60%. The  left ventricle has normal function. The left ventricle has no regional  wall motion abnormalities. Left ventricular diastolic parameters were  normal.   2. Right ventricular systolic function is normal. The right ventricular  size is normal. Tricuspid regurgitation signal is inadequate for assessing  PA pressure.   3. Left atrial size was upper normal.   4. Right atrial size was upper normal.   5. The mitral valve is grossly normal. Trivial mitral valve  regurgitation.   6. The aortic valve is tricuspid. Aortic valve regurgitation is mild.   7. The inferior vena cava is normal in size with greater than 50%  respiratory variability, suggesting right atrial pressure of 3 mmHg  Cardiac catheterization 07/05/2019  Prox LAD to Mid LAD lesion is 70% stenosed. A drug-eluting stent was successfully placed using a STENT RESOLUTE ONYX 3.0X22. Post intervention, there is a 0% residual stenosis. Mid LAD lesion is 50% stenosed. A drug-eluting stent was successfully placed using a STENT RESOLUTE ONYX 2.5X30. Post intervention, there is a 0% residual stenosis. 1st Mrg lesion is 95% stenosed. The left ventricular systolic function is normal. LV end diastolic pressure is normal. The left ventricular ejection fraction is 55-65% by visual estimate.   1. Severe 2 vessel obstructive CAD. Abnormal FFR of the LAD. 2. Normal LV function 3. Normal LVEDP 4. Successful PCI of the ostial to mid LAD with IVUS guidance and DES x 2.    Plan: DAPT for one year. Optimize medical  therapy. Smoking cessation. If patient continues to have symptoms despite optimal therapy could consider PCI of the OM but this is a small branch and supplies a relatively small area of myocardium.   Diagnostic Dominance: Right   Intervention      Cardiac catheterization 07/20/2019   Previously placed Mid LAD drug eluting stent is widely patent. Previously placed Prox LAD to Mid LAD drug eluting stent is widely patent. 1st Mrg lesion is 95% stenosed. A drug-eluting stent was successfully placed using a STENT RESOLUTE ONYX 2.25X26, postdilated to 2.6 mm. Post intervention, there is a 0% residual stenosis. The left ventricular systolic function is normal. LV end diastolic pressure is normal. The left ventricular ejection fraction is 55-65% by visual estimate. There is no aortic valve stenosis.   Continue aggressive secondary prevention.  Consider clopidogrel monotherapy after 12 months.    Watch overnight.  Home tomorrow.    He is interested in getting the COVID-19 vaccine.  I encouraged him to get the vaccines when possible.   Diagnostic Dominance: Right  Intervention        Assessment and Plan:   1. CAD in native artery Patient states he only has some mild chest discomfort when overdoing it when mowing the yard or weeding or detailing his car.  He states the increase in Imdur to 60 mg seems to have relieved his chest pain.  Continue aspirin 81 mg daily.  Continue Imdur 60 mg daily.  Continue sublingual nitroglycerin as needed.   2. Essential hypertension Blood pressure is well controlled on current therapy.  Blood pressure today 124/70.  Continue amlodipine 5 mg daily, atenolol 50 mg daily  3. Mixed hyperlipidemia Continue atorvastatin 80 mg daily.  Lipid panel on 08/09/2020 TC 156,  TG 142, HDL 57, LDL 75.  Please start Zetia 10 mg p.o. daily.   4. Tobacco abuse Patient states he is not smoking anymore.  Encouraged him to continue with cessation.   Medication  Adjustments/Labs and Tests Ordered: Current medicines are reviewed at length with the patient today.  Concerns regarding medicines are outlined above.   Disposition: Follow-up with Dr. Harl Bowie or APP 6 months  Signed, [name] July, NP 11/12/2020 11:14 AM    [facility] [place], [place], [place] [address] Phone: [phone]; Fax: [phone]

## 2020-11-12 ENCOUNTER — Ambulatory Visit (INDEPENDENT_AMBULATORY_CARE_PROVIDER_SITE_OTHER): Payer: Medicare Other | Admitting: Family Medicine

## 2020-11-12 ENCOUNTER — Encounter: Payer: Self-pay | Admitting: Family Medicine

## 2020-11-12 VITALS — BP 124/70 | HR 68 | Ht 64.0 in | Wt 183.8 lb

## 2020-11-12 DIAGNOSIS — I10 Essential (primary) hypertension: Secondary | ICD-10-CM | POA: Diagnosis not present

## 2020-11-12 DIAGNOSIS — E782 Mixed hyperlipidemia: Secondary | ICD-10-CM

## 2020-11-12 DIAGNOSIS — Z720 Tobacco use: Secondary | ICD-10-CM

## 2020-11-12 DIAGNOSIS — I2510 Atherosclerotic heart disease of native coronary artery without angina pectoris: Secondary | ICD-10-CM

## 2020-11-12 MED ORDER — EZETIMIBE 10 MG PO TABS: 10.0000 mg | ORAL_TABLET | Freq: Every day | ORAL | 3 refills | Status: DC

## 2020-11-12 NOTE — Patient Instructions (Addendum)
Medication Instructions:  Your physician has recommended you make the following change in your medication:  Start zetia 10 mg daily Continue other medications the same  Labwork: none  Testing/Procedures: none  Follow-Up: Your physician recommends that you schedule a follow-up appointment in: 6 months at the [facility] office with new cardiologist.  Any Other Special Instructions Will Be Listed Below (If Applicable).  If you need a refill on your cardiac medications before your next appointment, please call your pharmacy.

## 2020-11-19 DIAGNOSIS — M25561 Pain in right knee: Secondary | ICD-10-CM | POA: Diagnosis not present

## 2020-11-19 DIAGNOSIS — R200 Anesthesia of skin: Secondary | ICD-10-CM | POA: Diagnosis not present

## 2020-11-19 DIAGNOSIS — G5603 Carpal tunnel syndrome, bilateral upper limbs: Secondary | ICD-10-CM | POA: Diagnosis not present

## 2020-11-19 DIAGNOSIS — G5621 Lesion of ulnar nerve, right upper limb: Secondary | ICD-10-CM | POA: Diagnosis not present

## 2020-11-19 DIAGNOSIS — R202 Paresthesia of skin: Secondary | ICD-10-CM | POA: Diagnosis not present

## 2020-11-19 DIAGNOSIS — M1711 Unilateral primary osteoarthritis, right knee: Secondary | ICD-10-CM | POA: Diagnosis not present

## 2020-12-02 ENCOUNTER — Other Ambulatory Visit: Payer: Self-pay

## 2020-12-10 ENCOUNTER — Other Ambulatory Visit: Payer: Self-pay | Admitting: *Deleted

## 2020-12-10 ENCOUNTER — Telehealth: Payer: Self-pay | Admitting: Internal Medicine

## 2020-12-10 MED ORDER — ATORVASTATIN CALCIUM 80 MG PO TABS: 80.0000 mg | ORAL_TABLET | Freq: Every evening | ORAL | 2 refills | Status: DC

## 2020-12-10 MED ORDER — PANTOPRAZOLE SODIUM 40 MG PO TBEC: 40.0000 mg | DELAYED_RELEASE_TABLET | Freq: Two times a day (BID) | ORAL | 2 refills | Status: AC

## 2020-12-10 NOTE — Telephone Encounter (Signed)
Please send atorvastatin 80mg  & Pantoprazole 40mg  to   [facility]

## 2020-12-10 NOTE — Telephone Encounter (Signed)
Medication sent to pharmacy  

## 2020-12-23 ENCOUNTER — Encounter: Payer: Self-pay | Admitting: Internal Medicine

## 2020-12-23 ENCOUNTER — Other Ambulatory Visit: Payer: Self-pay

## 2020-12-23 ENCOUNTER — Telehealth: Payer: Self-pay | Admitting: Family Medicine

## 2020-12-23 ENCOUNTER — Ambulatory Visit (INDEPENDENT_AMBULATORY_CARE_PROVIDER_SITE_OTHER): Payer: Medicare Other | Admitting: Internal Medicine

## 2020-12-23 VITALS — BP 133/84 | HR 70 | Ht 66.0 in | Wt 186.0 lb

## 2020-12-23 DIAGNOSIS — I10 Essential (primary) hypertension: Secondary | ICD-10-CM | POA: Diagnosis not present

## 2020-12-23 DIAGNOSIS — Z0001 Encounter for general adult medical examination with abnormal findings: Secondary | ICD-10-CM | POA: Insufficient documentation

## 2020-12-23 DIAGNOSIS — J439 Emphysema, unspecified: Secondary | ICD-10-CM | POA: Diagnosis not present

## 2020-12-23 DIAGNOSIS — Z131 Encounter for screening for diabetes mellitus: Secondary | ICD-10-CM | POA: Diagnosis not present

## 2020-12-23 DIAGNOSIS — E559 Vitamin D deficiency, unspecified: Secondary | ICD-10-CM | POA: Diagnosis not present

## 2020-12-23 DIAGNOSIS — I2510 Atherosclerotic heart disease of native coronary artery without angina pectoris: Secondary | ICD-10-CM | POA: Diagnosis not present

## 2020-12-23 DIAGNOSIS — E785 Hyperlipidemia, unspecified: Secondary | ICD-10-CM | POA: Diagnosis not present

## 2020-12-23 DIAGNOSIS — Z125 Encounter for screening for malignant neoplasm of prostate: Secondary | ICD-10-CM | POA: Diagnosis not present

## 2020-12-23 DIAGNOSIS — E538 Deficiency of other specified B group vitamins: Secondary | ICD-10-CM | POA: Diagnosis not present

## 2020-12-23 NOTE — Patient Instructions (Signed)
Please continue taking medications as prescribed.  Please continue to follow low salt diet and ambulate as tolerated. 

## 2020-12-23 NOTE — Assessment & Plan Note (Signed)
Ordered PSA after discussing its limitations for prostate cancer screening, including false positive results leading additional investigations. 

## 2020-12-23 NOTE — Telephone Encounter (Signed)
Pt has appt with Dr. Johney Frame 12/30/20 in [place].   Will fwd to provider as Juluis Rainier

## 2020-12-23 NOTE — Assessment & Plan Note (Signed)

## 2020-12-23 NOTE — Assessment & Plan Note (Signed)
Well-controlled with Anoro - refilled Albuterol PRN.

## 2020-12-23 NOTE — Progress Notes (Signed)
Established Patient Office Visit  Subjective:  Patient ID: Bill Johnson, male    DOB: 1955-06-25  Age: 65 y.o. MRN: [id number]  CC:  Chief Complaint  Patient presents with   Follow-up   Annual Exam    No energy tired get wore out easy sees cardiology Monday     HPI Bill Johnson is a 65 y.o. male with past medical history of CAD s/p stent placement, hypertension, COPD, GERD, OA of shoulder, cervical spondylosis, anxiety and chronic knee pain who presents for annual physical.  COPD: Uses Anoro regularly. Rarely needs Albuterol. Has quit smoking. Denies any wheezing currently.   CAD: He had been hospitalized for episode of chest pain in 03/2020, and had cardiac cath, which showed patent stents.  His loading dose CT chest showed multivessel CAD.  She takes Imdur for anginal pain, but still has episodes of angina.  He also reports exertional dyspnea. On DAPT and statin. Followed by Cardiology.   HTN: BP is well-controlled. Takes medications regularly. Patient denies headache, dizziness, chest pain, dyspnea or palpitations.  He is up-to-date with flu, pneumococcal and Shingrix vaccines.  He recently had Shingrix vaccines at local pharmacy, dates unknown at this time.  Past Medical History:  Diagnosis Date   Alcohol abuse    Allergy    seasonal   Anxiety    CAD (coronary artery disease)    a. 06/2019 cath - s/p successful PCI of the ostial to mid LAD with IVUS guidance and DES x 2.   Depression    Essential hypertension    GERD (gastroesophageal reflux disease)    HOH (hard of hearing)    Hyperlipidemia    Hypertension    OA (osteoarthritis) of knee    Pulmonary nodules    a. seen on coronary CT 05/2019, will need OP f/u.   Rotator cuff tear arthropathy of both shoulders    On DISABILITY, non operative case   Shoulder pain    Tobacco abuse    Vertigo     Past Surgical History:  Procedure Laterality Date   CARDIAC CATHETERIZATION  06/2005   no significant CAD    COLONOSCOPY WITH PROPOFOL N/A 05/01/2019   Procedure: COLONOSCOPY WITH PROPOFOL;  Surgeon: Daneil Dolin, MD;  [facility];  Service: Endoscopy;  Laterality: N/A;  7:30am   COLONOSCOPY WITH PROPOFOL N/A 03/07/2020   Procedure: COLONOSCOPY WITH PROPOFOL;  Surgeon: Daneil Dolin, MD;  [facility];  Service: Endoscopy;  Laterality: N/A;  8:30am   CORONARY STENT INTERVENTION N/A 07/05/2019   Procedure: CORONARY STENT INTERVENTION;  Surgeon: Martinique, Peter M, MD;  Location: [facility];  Service: Cardiovascular;  Laterality: N/A;   CORONARY STENT INTERVENTION N/A 07/20/2019   Procedure: CORONARY STENT INTERVENTION;  Surgeon: Jettie Booze, MD;  Location: [facility];  Service: Cardiovascular;  Laterality: N/A;   ESOPHAGOGASTRODUODENOSCOPY  08/2005   Dr. Henrene Pastor: small hiatal hernia   HERNIA REPAIR  1983   INTRAVASCULAR PRESSURE WIRE/FFR STUDY N/A 07/05/2019   Procedure: INTRAVASCULAR PRESSURE WIRE/FFR STUDY;  Surgeon: Martinique, Peter M, MD;  Location: [facility];  Service: Cardiovascular;  Laterality: N/A;   INTRAVASCULAR ULTRASOUND/IVUS N/A 07/05/2019   Procedure: Intravascular Ultrasound/IVUS;  Surgeon: Martinique, Peter M, MD;  Location: [place] [facility];  Service: Cardiovascular;  Laterality: N/A;   LEFT HEART CATH AND CORONARY ANGIOGRAPHY N/A 07/05/2019   Procedure: LEFT HEART CATH AND CORONARY ANGIOGRAPHY;  Surgeon: Martinique, Peter M, MD;  Location: [facility];  Service: Cardiovascular;  Laterality: N/A;   LEFT HEART CATH AND CORONARY ANGIOGRAPHY N/A 07/20/2019   Procedure: LEFT HEART CATH AND CORONARY ANGIOGRAPHY;  Surgeon: Jettie Booze, MD;  Location: [facility];  Service: Cardiovascular;  Laterality: N/A;   LEFT HEART CATH AND CORONARY ANGIOGRAPHY N/A 04/24/2020   Procedure: LEFT HEART CATH AND CORONARY ANGIOGRAPHY;  Surgeon: Belva Crome, MD;  Location: [facility] [place] [facility];  Service: Cardiovascular;  Laterality: N/A;   POLYPECTOMY   05/01/2019   Procedure: POLYPECTOMY;  Surgeon: Daneil Dolin, MD;  [facility];  Service: Endoscopy;;   POLYPECTOMY  03/07/2020   Procedure: POLYPECTOMY;  Surgeon: Daneil Dolin, MD;  [facility];  Service: Endoscopy;;   ROTATOR CUFF REPAIR Bilateral    TONSILLECTOMY  1977    Family History  Problem Relation Age of Onset   Heart disease Mother    Diabetes Mother    Hypertension Mother    Heart disease Father    Hypertension Father    Stroke Other    Cancer Other    Cancer Brother        prostate   Colon cancer Neg Hx     Social History   Socioeconomic History   Marital status: Divorced    Spouse name: Not on file   Number of children: 2   Years of education: 12   Highest education level: Not on file  Occupational History   Occupation: Self-employed Designer, television/film set)  Tobacco Use   Smoking status: Former    Packs/day: 1.00    Years: 43.00    Pack years: 43.00    Types: Cigarettes    Start date: 04/15/1970    Quit date: 07/26/2020    Years since quitting: 0.4   Smokeless tobacco: Never  Vaping Use   Vaping Use: Never used  Substance and Sexual Activity   Alcohol use: Yes    Comment: Drinks 3-4 beers/day    Drug use: No   Sexual activity: Yes  Other Topics Concern   Not on file  Social History Narrative   Lives at home with nephew and niece in-law   Caffeine use: Drinks coffee/soda every day   Social Determinants of Radio broadcast assistant Strain: Not on file  Food Insecurity: Not on file  Transportation Needs: Not on file  Physical Activity: Not on file  Stress: Not on file  Social Connections: Not on file  Intimate Partner Violence: Not on file    Outpatient Medications Prior to Visit  Medication Sig Dispense Refill   acetaminophen (TYLENOL) 500 MG tablet Take 500 mg by mouth as needed for mild pain or headache.      albuterol (VENTOLIN HFA) 108 (90 Base) MCG/ACT inhaler Inhale 2 puffs into the lungs every 6 (six) hours as  needed for wheezing or shortness of breath. 1 each 2   amLODipine (NORVASC) 5 MG tablet Take 1 tablet (5 mg total) by mouth daily. 90 tablet 3   aspirin 81 MG tablet Take 81 mg by mouth daily.     atenolol (TENORMIN) 50 MG tablet Take 1 tablet (50 mg total) by mouth daily. 90 tablet 2   atorvastatin (LIPITOR) 80 MG tablet Take 1 tablet (80 mg total) by mouth every evening. 90 tablet 2   cetirizine (ZYRTEC) 10 MG tablet Take 10 mg by mouth daily.     clopidogrel (PLAVIX) 75 MG tablet Take 1 tablet (75 mg total) by  mouth daily. 90 tablet 0   [employer] VITAMIN B-12 2000 MCG TBCR Take 2,000 mcg by mouth daily.     diazepam (VALIUM) 5 MG tablet Take 1 tablet (5 mg total) by mouth every 12 (twelve) hours as needed for anxiety. 45 tablet 2   ezetimibe (ZETIA) 10 MG tablet Take 1 tablet (10 mg total) by mouth daily. 90 tablet 3   isosorbide mononitrate (IMDUR) 60 MG 24 hr tablet Take 1 tablet (60 mg total) by mouth daily. 90 tablet 3   meclizine (ANTIVERT) 25 MG tablet TAKE 1 TABLET BY MOUTH THREE TIMES DAILY AS NEEDED FOR DIZZINESS 90 tablet 2   nitroGLYCERIN (NITROSTAT) 0.4 MG SL tablet Place 1 tablet (0.4 mg total) under the tongue every 5 (five) minutes as needed for chest pain (up to 3 doses). 25 tablet 3   pantoprazole (PROTONIX) 40 MG tablet Take 1 tablet (40 mg total) by mouth 2 (two) times daily. 180 tablet 2   umeclidinium-vilanterol (ANORO ELLIPTA) 62.5-25 MCG/INH AEPB Inhale 1 puff into the lungs daily. 1 each 11   No facility-administered medications prior to visit.    No Known Allergies  ROS Review of Systems  Constitutional:  Negative for chills and fever.  HENT:  Negative for congestion and sore throat.   Eyes:  Negative for pain and discharge.  Respiratory:  Negative for cough and shortness of breath.   Cardiovascular:  Negative for chest pain and palpitations.  Gastrointestinal:  Negative for constipation, diarrhea, nausea and vomiting.  Endocrine: Negative for polydipsia and  polyuria.  Genitourinary:  Negative for dysuria and hematuria.  Musculoskeletal:  Positive for arthralgias, back pain and neck pain. Negative for neck stiffness.  Skin:  Negative for rash.  Neurological:  Negative for dizziness, weakness, numbness and headaches.  Psychiatric/Behavioral:  Negative for agitation and behavioral problems.      Objective:    Physical Exam Vitals reviewed.  Constitutional:      General: He is not in acute distress.    Appearance: He is not diaphoretic.  HENT:     Head: Normocephalic and atraumatic.     Nose: Nose normal.     Mouth/Throat:     Mouth: Mucous membranes are moist.  Eyes:     General: No scleral icterus.    Extraocular Movements: Extraocular movements intact.  Cardiovascular:     Rate and Rhythm: Normal rate and regular rhythm.     Pulses: Normal pulses.     Heart sounds: Normal heart sounds. No murmur heard. Pulmonary:     Breath sounds: Normal breath sounds. No wheezing or rales.  Abdominal:     Palpations: Abdomen is soft.     Tenderness: There is no abdominal tenderness.  Musculoskeletal:     Cervical back: Neck supple. No tenderness.     Right lower leg: No edema.     Left lower leg: No edema.  Skin:    General: Skin is warm.     Findings: No rash.  Neurological:     General: No focal deficit present.     Mental Status: He is alert and oriented to person, place, and time.     Cranial Nerves: No cranial nerve deficit.     Sensory: No sensory deficit.     Motor: No weakness.  Psychiatric:        Mood and Affect: Mood normal.        Behavior: Behavior normal.    BP 133/84   Pulse 70   Ht 5'  6" (1.676 m)   Wt 186 lb 0.6 oz (84.4 kg)   SpO2 94%   BMI 30.03 kg/m  Wt Readings from Last 3 Encounters:  12/23/20 186 lb 0.6 oz (84.4 kg)  11/12/20 183 lb 12.8 oz (83.4 kg)  09/20/20 178 lb (80.7 kg)    Lab Results  Component Value Date   TSH 0.774 11/28/2014   Lab Results  Component Value Date   WBC 10.2 04/25/2020    HGB 12.9 (L) 04/25/2020   HCT 39.3 04/25/2020   MCV 101.3 (H) 04/25/2020   PLT 158 04/25/2020   Lab Results  Component Value Date   NA 142 08/09/2020   K 4.2 08/09/2020   CO2 21 08/09/2020   GLUCOSE 92 08/09/2020   BUN 9 08/09/2020   CREATININE 0.73 (L) 08/09/2020   BILITOT 0.7 12/01/2019   ALKPHOS 45 03/18/2019   AST 25 12/01/2019   ALT 20 12/01/2019   PROT 7.2 12/01/2019   ALBUMIN 3.1 (L) 03/18/2019   CALCIUM 9.0 08/09/2020   ANIONGAP 7 04/25/2020   EGFR 100 08/09/2020   Lab Results  Component Value Date   CHOL 156 08/09/2020   Lab Results  Component Value Date   HDL 57 08/09/2020   Lab Results  Component Value Date   LDLCALC 75 08/09/2020   Lab Results  Component Value Date   TRIG 142 08/09/2020   Lab Results  Component Value Date   CHOLHDL 2.7 08/09/2020   Lab Results  Component Value Date   HGBA1C 5.7 (H) 04/24/2020      Assessment & Plan:   Problem List Items Addressed This Visit       Encounter for general adult medical examination with abnormal findings - Primary   Physical exam as documented. Counseling done  re healthy lifestyle involving commitment to 150 minutes exercise per week, heart healthy diet, and attaining healthy weight.The importance of adequate sleep also discussed. Changes in health habits are decided on by the patient with goals and time frames  set for achieving them. Immunization and cancer screening needs are specifically addressed at this visit.     Relevant Orders  TSH  Hemoglobin A1c  CMP14+EGFR  CBC with Differential/Platelet  Lipid Profile    Cardiovascular and Mediastinum   CAD, multiple vessel    S/p stent placement in 06/2019 On DAPT and statin On Imdur and PRN Nitroglycerin for angina Follows up with Cardiologist        Respiratory   COPD (chronic obstructive pulmonary disease) ([place])    Well-controlled with Anoro - refilled Albuterol PRN.        Other      Prostate cancer screening     Ordered PSA after discussing its limitations for prostate cancer screening, including false positive results leading additional investigations.      Relevant Orders   PSA   Other Visit Diagnoses     Vitamin D deficiency       Relevant Orders   VITAMIN D 25 Hydroxy (Vit-D Deficiency, Fractures)       No orders of the defined types were placed in this encounter.   Follow-up: Return in about 4 months (around 04/22/2021) for HTN and COPD.    Lindell Spar, MD

## 2020-12-23 NOTE — Telephone Encounter (Signed)
Pt came into the office stating that when he walks he's getting really short of breath w/ walking or doing anything when he's up moving around. States he's having a pounding feeling in his head. Will occasionally have chest pain, but not currently having active chest pain.    Pt is requesting an apt to be seen. He has a closed caption phone and he has a hard time hearing on it.   [phone]

## 2020-12-23 NOTE — Progress Notes (Deleted)
Cardiology Office Note:    Date:  12/23/2020   ID:  Bill Johnson, DOB 03/26/1955, MRN [id number]  PCP:  Lindell Spar, MD   [facility] Providers Cardiologist:  None {   Referring MD: Lindell Spar, MD    History of Present Illness:    Bill Johnson is a 65 y.o. male with a hx of HTN, GERD with esophagitis, COPD, OA, cervical spondylosis, anxiety, tobacco and alcohol abuse, CAD s/p DESx2 to o-mLAD 07/05/19 for CP and +CCTA, then DES to 95% OM1 stenosis 07/20/19 for residual symptoms who presents to clinic for urgent visit for shortness of breath.  Patient admitted from 04/24/19-04/26/19 for chest pain. Cath done on that admission showed widely patent previously placed stents in mid-LAD (mild ISR), prox-to-mid LAD, and OM1 with no new lesions. He was then discharged on medical management. Of note, during previous admission for chest pain it was thought that his jailed diagonal may be the source of his discomfort.  Last saw [name] July, NP where he was doing well without anginal symptoms.  Today, ***   Past Medical History:  Diagnosis Date   Alcohol abuse    Allergy    seasonal   Anxiety    CAD (coronary artery disease)    a. 06/2019 cath - s/p successful PCI of the ostial to mid LAD with IVUS guidance and DES x 2.   Depression    Essential hypertension    GERD (gastroesophageal reflux disease)    HOH (hard of hearing)    Hyperlipidemia    Hypertension    OA (osteoarthritis) of knee    Pulmonary nodules    a. seen on coronary CT 05/2019, will need OP f/u.   Rotator cuff tear arthropathy of both shoulders    On DISABILITY, non operative case   Shoulder pain    Tobacco abuse    Vertigo     Past Surgical History:  Procedure Laterality Date   CARDIAC CATHETERIZATION  06/2005   no significant CAD   COLONOSCOPY WITH PROPOFOL N/A 05/01/2019   Procedure: COLONOSCOPY WITH PROPOFOL;  Surgeon: Daneil Dolin, MD;  [facility];  Service: Endoscopy;   Laterality: N/A;  7:30am   COLONOSCOPY WITH PROPOFOL N/A 03/07/2020   Procedure: COLONOSCOPY WITH PROPOFOL;  Surgeon: Daneil Dolin, MD;  [facility]: [facility];  Service: Endoscopy;  Laterality: N/A;  8:30am   CORONARY STENT INTERVENTION N/A 07/05/2019   Procedure: CORONARY STENT INTERVENTION;  Surgeon: Martinique, Peter M, MD;  Location: [place] [facility];  Service: Cardiovascular;  Laterality: N/A;   CORONARY STENT INTERVENTION N/A 07/20/2019   Procedure: CORONARY STENT INTERVENTION;  Surgeon: Jettie Booze, MD;  Location: [place] [facility];  Service: Cardiovascular;  Laterality: N/A;   ESOPHAGOGASTRODUODENOSCOPY  08/2005   Dr. Henrene Pastor: small hiatal hernia   HERNIA REPAIR  1983   INTRAVASCULAR PRESSURE WIRE/FFR STUDY N/A 07/05/2019   Procedure: INTRAVASCULAR PRESSURE WIRE/FFR STUDY;  Surgeon: Martinique, Peter M, MD;  Location: [place] [facility];  Service: Cardiovascular;  Laterality: N/A;   INTRAVASCULAR ULTRASOUND/IVUS N/A 07/05/2019   Procedure: Intravascular Ultrasound/IVUS;  Surgeon: Martinique, Peter M, MD;  Location: [place] [facility];  Service: Cardiovascular;  Laterality: N/A;   LEFT HEART CATH AND CORONARY ANGIOGRAPHY N/A 07/05/2019   Procedure: LEFT HEART CATH AND CORONARY ANGIOGRAPHY;  Surgeon: Martinique, Peter M, MD;  Location: [place] [facility];  Service: Cardiovascular;  Laterality: N/A;   LEFT HEART CATH AND CORONARY ANGIOGRAPHY N/A  07/20/2019   Procedure: LEFT HEART CATH AND CORONARY ANGIOGRAPHY;  Surgeon: Jettie Booze, MD;  Location: [place] [facility];  Service: Cardiovascular;  Laterality: N/A;   LEFT HEART CATH AND CORONARY ANGIOGRAPHY N/A 04/24/2020   Procedure: LEFT HEART CATH AND CORONARY ANGIOGRAPHY;  Surgeon: Belva Crome, MD;  Location: [place] [facility];  Service: Cardiovascular;  Laterality: N/A;   POLYPECTOMY  05/01/2019   Procedure: POLYPECTOMY;  Surgeon: Daneil Dolin, MD;  [facility];  Service: Endoscopy;;   POLYPECTOMY  03/07/2020   Procedure:  POLYPECTOMY;  Surgeon: Daneil Dolin, MD;  [facility];  Service: Endoscopy;;   ROTATOR CUFF REPAIR Bilateral    TONSILLECTOMY  1977    Current Medications: No outpatient medications have been marked as taking for the 12/30/20 encounter (Appointment) with Freada Bergeron, MD.     Allergies:   Patient has no known allergies.   Social History   Socioeconomic History   Marital status: Divorced    Spouse name: Not on file   Number of children: 2   Years of education: 12   Highest education level: Not on file  Occupational History   Occupation: Self-employed Designer, television/film set)  Tobacco Use   Smoking status: Former    Packs/day: 1.00    Years: 43.00    Pack years: 43.00    Types: Cigarettes    Start date: 04/15/1970    Quit date: 07/26/2020    Years since quitting: 0.4   Smokeless tobacco: Never  Vaping Use   Vaping Use: Never used  Substance and Sexual Activity   Alcohol use: Yes    Comment: Drinks 3-4 beers/day    Drug use: No   Sexual activity: Yes  Other Topics Concern   Not on file  Social History Narrative   Lives at home with nephew and niece in-law   Caffeine use: Drinks coffee/soda every day   Social Determinants of Radio broadcast assistant Strain: Not on file  Food Insecurity: Not on file  Transportation Needs: Not on file  Physical Activity: Not on file  Stress: Not on file  Social Connections: Not on file     Family History: The patient's ***family history includes Cancer in his brother and another family member; Diabetes in his mother; Heart disease in his father and mother; Hypertension in his father and mother; Stroke in an other family member. There is no history of Colon cancer.  ROS:   Please see the history of present illness.    *** All other systems reviewed and are negative.  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  The vessel is very small in size and contains luminal irregularities. The right coronary is dominant.  The PDA arises from the distal portion of the mid vessel.  The origin of the PDA contains a 50 to 60% eccentric narrowing. Left main is widely patent. The left main gives origin to the LAD and what should be considered a ramus intermedius that then bifurcates.  The superior most branch of the ramus bifurcation contains a patent stent. The LAD is a large vessel and contains an ostial to mid vessel stent the jails a diagonal distally.  The diagonal contains ostial 50 to 70% narrowing. Normal left ventricular function.  LVEF 55%.  RECOMMENDATIONS:   The previously placed stents are widely patent. Ongoing chest pain without enzyme abnormality or EKG changes suggests the possibility of an alternative explanation. Per treating team   Diagnostic Dominance: Right           Echocardiogram: 02/2019 IMPRESSIONS   1. Left ventricular ejection fraction, by estimation, is 55 to 60%. The  left ventricle has normal function. The left ventricle has no regional  wall motion abnormalities. Left ventricular diastolic parameters were  normal.   2. Right ventricular systolic function is normal. The right ventricular  size is normal. Tricuspid regurgitation signal is inadequate for assessing  PA pressure.   3. Left atrial size was upper normal.   4. Right atrial size was upper normal.   5. The mitral valve is grossly normal. Trivial mitral valve  regurgitation.   6. The aortic valve is tricuspid. Aortic valve regurgitation is mild.   7. The inferior vena cava is normal in size with greater than 50%  respiratory variability, suggesting right atrial pressure of 3 mmHg   Cardiac catheterization 07/05/2019   Prox LAD to Mid LAD lesion is 70% stenosed. A drug-eluting stent was successfully placed using  a STENT RESOLUTE ONYX 3.0X22. Post intervention, there is a 0% residual stenosis. Mid LAD lesion is 50% stenosed. A drug-eluting stent was successfully placed using a STENT RESOLUTE ONYX 2.5X30. Post intervention, there is a 0% residual stenosis. 1st Mrg lesion is 95% stenosed. The left ventricular systolic function is normal. LV end diastolic pressure is normal. The left ventricular ejection fraction is 55-65% by visual estimate.   1. Severe 2 vessel obstructive CAD. Abnormal FFR of the LAD. 2. Normal LV function 3. Normal LVEDP 4. Successful PCI of the ostial to mid LAD with IVUS guidance and DES x 2.    Plan: DAPT for one year. Optimize medical therapy. Smoking cessation. If patient continues to have symptoms despite optimal therapy could consider PCI of the OM but this is a small branch and supplies a relatively small area of myocardium.   Diagnostic Dominance: Right   Intervention         Cardiac catheterization 07/20/2019   Previously placed Mid LAD drug eluting stent is widely patent. Previously placed Prox LAD to Mid LAD drug eluting stent is widely patent. 1st Mrg lesion is 95% stenosed. A drug-eluting stent was successfully placed using a STENT RESOLUTE ONYX 2.25X26, postdilated to 2.6 mm. Post intervention, there is a 0% residual stenosis. The left ventricular systolic function is normal. LV end diastolic pressure is normal. The left ventricular ejection fraction is 55-65% by visual estimate. There is no aortic valve stenosis.   Continue aggressive secondary prevention.  Consider clopidogrel monotherapy after 12 months.    Watch overnight.  Home tomorrow.    He is interested in getting the COVID-19 vaccine.  I encouraged him to get the vaccines when possible.   Diagnostic Dominance: Right Intervention        EKG:  EKG is *** ordered today.  The ekg ordered today demonstrates ***  Recent Labs: 04/25/2020: Hemoglobin 12.9; Platelets 158 08/09/2020: BUN 9;  Creatinine, Ser 0.73; Potassium 4.2; Sodium 142  Recent Lipid Panel    Component Value Date/Time   CHOL 156 08/09/2020 0813   TRIG 142 08/09/2020 0813   HDL 57 08/09/2020 0813   CHOLHDL 2.7 08/09/2020 0813   CHOLHDL 3.2 04/24/2020 0400   VLDL 41 (H) 04/24/2020 0400   LDLCALC 75 08/09/2020 0813   LDLCALC 71 09/01/2019 0859  Risk Assessment/Calculations:   {Does this patient have ATRIAL FIBRILLATION?:[phone]}       Physical Exam:    VS:  There were no vitals taken for this visit.    Wt Readings from Last 3 Encounters:  12/23/20 186 lb 0.6 oz (84.4 kg)  11/12/20 183 lb 12.8 oz (83.4 kg)  09/20/20 178 lb (80.7 kg)     GEN: *** Well nourished, well developed in no acute distress HEENT: Normal NECK: No JVD; No carotid bruits LYMPHATICS: No lymphadenopathy CARDIAC: ***RRR, no murmurs, rubs, gallops RESPIRATORY:  Clear to auscultation without rales, wheezing or rhonchi  ABDOMEN: Soft, non-tender, non-distended MUSCULOSKELETAL:  No edema; No deformity  SKIN: Warm and dry NEUROLOGIC:  Alert and oriented x 3 PSYCHIATRIC:  Normal affect   ASSESSMENT:    No diagnosis found. PLAN:    In order of problems listed above:  #Known Multivessel CAD s/p PCI: Patient with known CAD s/p PCI to LAD and OM1 with stable anatomy on cath in 03/2020. Now with increasing dyspnea on exertion. -Check myoview -Continue ASA 81mg  daily -Continue lipitor 80mg  daily -Continue zetia 10mg  daily -Continue plavix 75mg  daily -Continue atenolol 50mg  daily -Continue imdur 60mg  daily  #HTN: -Continue atenolol 50mg  daily -Continue imdur 60mg  daily -Continue amlodipine 5mg  daily  #HLD: -Continue lipitor 80mg  daily -Continue zetia 10mg  daily  #Tobacco Abuse:      {Are you ordering a CV Procedure (e.g. stress test, cath, DCCV, TEE, etc)?   Press F2        :[id number]}    Medication Adjustments/Labs and Tests Ordered: Current medicines are reviewed at length with the patient today.   Concerns regarding medicines are outlined above.  No orders of the defined types were placed in this encounter.  No orders of the defined types were placed in this encounter.   There are no Patient Instructions on file for this visit.   Signed, Freada Bergeron, MD  12/23/2020 6:44 PM    Bill

## 2020-12-23 NOTE — Assessment & Plan Note (Signed)
S/p stent placement in 06/2019 On DAPT and statin On Imdur and PRN Nitroglycerin for angina Follows up with Cardiologist

## 2020-12-24 LAB — CBC WITH DIFFERENTIAL/PLATELET
Basophils Absolute: 0 10*3/uL (ref 0.0–0.2)
Basos: 1 %
EOS (ABSOLUTE): 0.3 10*3/uL (ref 0.0–0.4)
Eos: 4 %
Hematocrit: 42.2 % (ref 37.5–51.0)
Hemoglobin: 14 g/dL (ref 13.0–17.7)
Immature Grans (Abs): 0 10*3/uL (ref 0.0–0.1)
Immature Granulocytes: 0 %
Lymphocytes Absolute: 2 10*3/uL (ref 0.7–3.1)
Lymphs: 29 %
MCH: 32.3 pg (ref 26.6–33.0)
MCHC: 33.2 g/dL (ref 31.5–35.7)
MCV: 98 fL — ABNORMAL HIGH (ref 79–97)
Monocytes Absolute: 0.8 10*3/uL (ref 0.1–0.9)
Monocytes: 11 %
Neutrophils Absolute: 3.9 10*3/uL (ref 1.4–7.0)
Neutrophils: 55 %
Platelets: 203 10*3/uL (ref 150–450)
RBC: 4.33 x10E6/uL (ref 4.14–5.80)
RDW: 12.6 % (ref 11.6–15.4)
WBC: 7.1 10*3/uL (ref 3.4–10.8)

## 2020-12-24 LAB — CMP14+EGFR
ALT: 21 IU/L (ref 0–44)
AST: 21 IU/L (ref 0–40)
Albumin/Globulin Ratio: 1.7 (ref 1.2–2.2)
Albumin: 4.3 g/dL (ref 3.8–4.8)
Alkaline Phosphatase: 74 IU/L (ref 44–121)
BUN/Creatinine Ratio: 7 — ABNORMAL LOW (ref 10–24)
BUN: 6 mg/dL — ABNORMAL LOW (ref 8–27)
Bilirubin Total: 0.7 mg/dL (ref 0.0–1.2)
CO2: 21 mmol/L (ref 20–29)
Calcium: 8.9 mg/dL (ref 8.6–10.2)
Chloride: 105 mmol/L (ref 96–106)
Creatinine, Ser: 0.83 mg/dL (ref 0.76–1.27)
Globulin, Total: 2.5 g/dL (ref 1.5–4.5)
Glucose: 93 mg/dL (ref 70–99)
Potassium: 4.8 mmol/L (ref 3.5–5.2)
Sodium: 142 mmol/L (ref 134–144)
Total Protein: 6.8 g/dL (ref 6.0–8.5)
eGFR: 97 mL/min/{1.73_m2} (ref >59)

## 2020-12-24 LAB — VITAMIN D 25 HYDROXY (VIT D DEFICIENCY, FRACTURES): Vit D, 25-Hydroxy: 21.4 ng/mL — ABNORMAL LOW (ref 30.0–100.0)

## 2020-12-24 LAB — PSA: Prostate Specific Ag, Serum: 0.4 ng/mL (ref 0.0–4.0)

## 2020-12-24 LAB — LIPID PANEL
Chol/HDL Ratio: 3.4 ratio (ref 0.0–5.0)
Cholesterol, Total: 128 mg/dL (ref 100–199)
HDL: 38 mg/dL — ABNORMAL LOW (ref >39)
LDL Chol Calc (NIH): 68 mg/dL (ref 0–99)
Triglycerides: 122 mg/dL (ref 0–149)
VLDL Cholesterol Cal: 22 mg/dL (ref 5–40)

## 2020-12-24 LAB — TSH: TSH: 1.71 u[IU]/mL (ref 0.450–4.500)

## 2020-12-24 LAB — HEMOGLOBIN A1C
Est. average glucose Bld gHb Est-mCnc: 120 mg/dL
Hgb A1c MFr Bld: 5.8 % — ABNORMAL HIGH (ref 4.8–5.6)

## 2020-12-26 ENCOUNTER — Other Ambulatory Visit: Payer: Self-pay

## 2020-12-30 ENCOUNTER — Encounter: Payer: Self-pay | Admitting: Cardiology

## 2020-12-30 ENCOUNTER — Ambulatory Visit (INDEPENDENT_AMBULATORY_CARE_PROVIDER_SITE_OTHER): Payer: Medicare Other | Admitting: Cardiology

## 2020-12-30 ENCOUNTER — Other Ambulatory Visit (HOSPITAL_COMMUNITY)
Admission: RE | Admit: 2020-12-30 | Discharge: 2020-12-30 | Disposition: A | Discharge status: Home / Self Care | Payer: Medicare Other | Source: Ambulatory Visit | Attending: Cardiology | Admitting: Cardiology

## 2020-12-30 ENCOUNTER — Other Ambulatory Visit: Payer: Self-pay

## 2020-12-30 VITALS — BP 130/78 | HR 56 | Ht 66.0 in | Wt 187.0 lb

## 2020-12-30 DIAGNOSIS — I2510 Atherosclerotic heart disease of native coronary artery without angina pectoris: Secondary | ICD-10-CM

## 2020-12-30 DIAGNOSIS — I208 Other forms of angina pectoris: Secondary | ICD-10-CM | POA: Diagnosis not present

## 2020-12-30 DIAGNOSIS — E7800 Pure hypercholesterolemia, unspecified: Secondary | ICD-10-CM

## 2020-12-30 DIAGNOSIS — Z720 Tobacco use: Secondary | ICD-10-CM | POA: Diagnosis not present

## 2020-12-30 DIAGNOSIS — I10 Essential (primary) hypertension: Secondary | ICD-10-CM | POA: Diagnosis not present

## 2020-12-30 LAB — BASIC METABOLIC PANEL
Anion gap: 7 (ref 5–15)
BUN: 9 mg/dL (ref 8–23)
CO2: 27 mmol/L (ref 22–32)
Calcium: 9 mg/dL (ref 8.9–10.3)
Chloride: 103 mmol/L (ref 98–111)
Creatinine, Ser: 0.82 mg/dL (ref 0.61–1.24)
GFR, Estimated: >60 mL/min (ref >60)
Glucose, Bld: 92 mg/dL (ref 70–99)
Potassium: 3.8 mmol/L (ref 3.5–5.1)
Sodium: 137 mmol/L (ref 135–145)

## 2020-12-30 LAB — CBC
HCT: 44.5 % (ref 39.0–52.0)
Hemoglobin: 14.5 g/dL (ref 13.0–17.0)
MCH: 32.5 pg (ref 26.0–34.0)
MCHC: 32.6 g/dL (ref 30.0–36.0)
MCV: 99.8 fL (ref 80.0–100.0)
Platelets: 234 10*3/uL (ref 150–400)
RBC: 4.46 MIL/uL (ref 4.22–5.81)
RDW: 12.7 % (ref 11.5–15.5)
WBC: 7.7 10*3/uL (ref 4.0–10.5)
nRBC: 0 % (ref 0.0–0.2)

## 2020-12-30 MED ORDER — ISOSORBIDE MONONITRATE ER 60 MG PO TB24: 90.0000 mg | ORAL_TABLET | Freq: Every day | ORAL | 3 refills | Status: DC

## 2020-12-30 MED ORDER — RANOLAZINE ER 500 MG PO TB12: 500.0000 mg | ORAL_TABLET | Freq: Two times a day (BID) | ORAL | 3 refills | Status: DC

## 2020-12-30 NOTE — H&P (View-Only) (Signed)
Cardiology Office Note:    Date:  12/30/2020   ID:  Bill Johnson, DOB Aug 10, 1955, MRN [id number]  PCP:  Lindell Spar, MD   [facility] Providers Cardiologist:  None {   Referring MD: Lindell Spar, MD    History of Present Illness:    Bill Johnson is a 65 y.o. male with a hx of HTN, GERD with esophagitis, COPD, OA, cervical spondylosis, anxiety, tobacco and alcohol abuse, CAD s/p DESx2 to o-mLAD 07/05/19 for CP and +CCTA, then DES to 95% OM1 stenosis 07/20/19 for residual symptoms who presents to clinic for urgent visit for progressive angina.  Patient admitted from 04/23/20-04/25/20 for chest pain. Cath done on that admission showed widely patent previously placed stents in mid-LAD (mild ISR), prox-to-mid LAD, and OM1 with no new lesions. He was then discharged on medical management. Of note, during previous admission for chest pain it was thought that his jailed diagonal may be the source of his discomfort.  Last saw [name] July, NP where he was doing well without anginal symptoms.  Today, the patient states that he had been doing well over the spring and summer, however, over the past couple of weeks he has been having significant chest pain with minimal exertion. Specifically, he used to be able to push mow the lawn, make the bed, do the dishes without issues but now has been having chest pressure, dyspnea on minimal exertion, and the sensation that his "heart is going to beat out of his chest." Has been taking nitro in addition to the imdur that has helped some, but the pain does not go away until he sits down and rests for several minutes. Denies any orthopnea, PND, LE edema. He has successfully quit smoking and drinking. Has been compliant with all medications. Blood pressure has been well controlled.    Past Medical History:  Diagnosis Date   Alcohol abuse    Allergy    seasonal   Anxiety    CAD (coronary artery disease)    a. 06/2019 cath - s/p successful PCI of  the ostial to mid LAD with IVUS guidance and DES x 2.   Depression    Essential hypertension    GERD (gastroesophageal reflux disease)    HOH (hard of hearing)    Hyperlipidemia    Hypertension    OA (osteoarthritis) of knee    Pulmonary nodules    a. seen on coronary CT 05/2019, will need OP f/u.   Rotator cuff tear arthropathy of both shoulders    On DISABILITY, non operative case   Shoulder pain    Tobacco abuse    Vertigo     Past Surgical History:  Procedure Laterality Date   CARDIAC CATHETERIZATION  06/2005   no significant CAD   COLONOSCOPY WITH PROPOFOL N/A 05/01/2019   Procedure: COLONOSCOPY WITH PROPOFOL;  Surgeon: Daneil Dolin, MD;  [facility];  Service: Endoscopy;  Laterality: N/A;  7:30am   COLONOSCOPY WITH PROPOFOL N/A 03/07/2020   Procedure: COLONOSCOPY WITH PROPOFOL;  Surgeon: Daneil Dolin, MD;  [facility];  Service: Endoscopy;  Laterality: N/A;  8:30am   CORONARY STENT INTERVENTION N/A 07/05/2019   Procedure: CORONARY STENT INTERVENTION;  Surgeon: Martinique, Peter M, MD;  Location: [place] [facility];  Service: Cardiovascular;  Laterality: N/A;   CORONARY STENT INTERVENTION N/A 07/20/2019   Procedure: CORONARY STENT INTERVENTION;  Surgeon: Jettie Booze, MD;  Location: [place] [facility];  Service: Cardiovascular;  Laterality: N/A;   ESOPHAGOGASTRODUODENOSCOPY  08/2005   Dr. Henrene Pastor: small hiatal hernia   HERNIA REPAIR  1983   INTRAVASCULAR PRESSURE WIRE/FFR STUDY N/A 07/05/2019   Procedure: INTRAVASCULAR PRESSURE WIRE/FFR STUDY;  Surgeon: Martinique, Peter M, MD;  Location: [place] [facility];  Service: Cardiovascular;  Laterality: N/A;   INTRAVASCULAR ULTRASOUND/IVUS N/A 07/05/2019   Procedure: Intravascular Ultrasound/IVUS;  Surgeon: Martinique, Peter M, MD;  Location: [place] [facility];  Service: Cardiovascular;  Laterality: N/A;   LEFT HEART CATH AND CORONARY ANGIOGRAPHY N/A 07/05/2019   Procedure: LEFT HEART CATH AND CORONARY ANGIOGRAPHY;   Surgeon: Martinique, Peter M, MD;  Location: [place] [facility];  Service: Cardiovascular;  Laterality: N/A;   LEFT HEART CATH AND CORONARY ANGIOGRAPHY N/A 07/20/2019   Procedure: LEFT HEART CATH AND CORONARY ANGIOGRAPHY;  Surgeon: Jettie Booze, MD;  Location: [place] [facility];  Service: Cardiovascular;  Laterality: N/A;   LEFT HEART CATH AND CORONARY ANGIOGRAPHY N/A 04/24/2020   Procedure: LEFT HEART CATH AND CORONARY ANGIOGRAPHY;  Surgeon: Belva Crome, MD;  Location: [place] [facility];  Service: Cardiovascular;  Laterality: N/A;   POLYPECTOMY  05/01/2019   Procedure: POLYPECTOMY;  Surgeon: Daneil Dolin, MD;  [facility];  Service: Endoscopy;;   POLYPECTOMY  03/07/2020   Procedure: POLYPECTOMY;  Surgeon: Daneil Dolin, MD;  [facility];  Service: Endoscopy;;   ROTATOR CUFF REPAIR Bilateral    TONSILLECTOMY  1977    Current Medications: Current Meds  Medication Sig   acetaminophen (TYLENOL) 500 MG tablet Take 500 mg by mouth as needed for mild pain or headache.    albuterol (VENTOLIN HFA) 108 (90 Base) MCG/ACT inhaler Inhale 2 puffs into the lungs every 6 (six) hours as needed for wheezing or shortness of breath.   amLODipine (NORVASC) 5 MG tablet Take 1 tablet (5 mg total) by mouth daily.   aspirin 81 MG tablet Take 81 mg by mouth daily.   atenolol (TENORMIN) 50 MG tablet Take 1 tablet (50 mg total) by mouth daily.   atorvastatin (LIPITOR) 80 MG tablet Take 1 tablet (80 mg total) by mouth every evening.   cetirizine (ZYRTEC) 10 MG tablet Take 10 mg by mouth daily.   clopidogrel (PLAVIX) 75 MG tablet Take 1 tablet (75 mg total) by mouth daily.   [employer]S VITAMIN B-12 2000 MCG TBCR Take 2,000 mcg by mouth daily.   diazepam (VALIUM) 5 MG tablet Take 1 tablet (5 mg total) by mouth every 12 (twelve) hours as needed for anxiety.   ezetimibe (ZETIA) 10 MG tablet Take 1 tablet (10 mg total) by mouth daily.   isosorbide mononitrate (IMDUR) 60 MG 24 hr tablet Take 1.5  tablets (90 mg total) by mouth daily.   meclizine (ANTIVERT) 25 MG tablet TAKE 1 TABLET BY MOUTH THREE TIMES DAILY AS NEEDED FOR DIZZINESS   nitroGLYCERIN (NITROSTAT) 0.4 MG SL tablet Place 1 tablet (0.4 mg total) under the tongue every 5 (five) minutes as needed for chest pain (up to 3 doses).   pantoprazole (PROTONIX) 40 MG tablet Take 1 tablet (40 mg total) by mouth 2 (two) times daily.   ranolazine (RANEXA) 500 MG 12 hr tablet Take 1 tablet (500 mg total) by mouth 2 (two) times daily.   umeclidinium-vilanterol (ANORO ELLIPTA) 62.5-25 MCG/INH AEPB Inhale 1 puff into the lungs daily.   Vitamin D, Cholecalciferol, 25 MCG (1000 UT) TABS Take by mouth.   [DISCONTINUED] isosorbide mononitrate (IMDUR) 60 MG 24 hr  tablet Take 1 tablet (60 mg total) by mouth daily.     Allergies:   Patient has no known allergies.   Social History   Socioeconomic History   Marital status: Divorced    Spouse name: Not on file   Number of children: 2   Years of education: 12   Highest education level: Not on file  Occupational History   Occupation: Self-employed Designer, television/film set)  Tobacco Use   Smoking status: Former    Packs/day: 1.00    Years: 43.00    Pack years: 43.00    Types: Cigarettes    Start date: 04/15/1970    Quit date: 07/26/2020    Years since quitting: 0.4   Smokeless tobacco: Never  Vaping Use   Vaping Use: Never used  Substance and Sexual Activity   Alcohol use: Not Currently    Comment: Drinks 3-4 beers/day    Drug use: No   Sexual activity: Yes  Other Topics Concern   Not on file  Social History Narrative   Lives at home with nephew and niece in-law   Caffeine use: Drinks coffee/soda every day   Social Determinants of Radio broadcast assistant Strain: Not on file  Food Insecurity: Not on file  Transportation Needs: Not on file  Physical Activity: Not on file  Stress: Not on file  Social Connections: Not on file     Family History: The patient's family history includes  Cancer in his brother and another family member; Diabetes in his mother; Heart disease in his father and mother; Hypertension in his father and mother; Stroke in an other family member. There is no history of Colon cancer.  ROS:   Please see the history of present illness.    Review of Systems  Constitutional:  Positive for malaise/fatigue.  Respiratory:  Positive for shortness of breath.   Cardiovascular:  Positive for chest pain and palpitations. Negative for orthopnea, claudication, leg swelling and PND.  Neurological:  Negative for dizziness and loss of consciousness.    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  The vessel is very small in size and contains luminal irregularities. The right coronary is dominant.  The PDA arises from the distal portion of the mid vessel.  The origin of the PDA contains a 50 to 60% eccentric narrowing. Left main is widely patent. The left main gives origin to the LAD and what should be considered a ramus intermedius that then bifurcates.  The superior most branch of the ramus bifurcation contains a patent stent. The LAD is a large vessel and contains an ostial to mid vessel stent the jails a diagonal distally.  The diagonal contains ostial 50 to 70% narrowing. Normal left ventricular function.  LVEF 55%.   RECOMMENDATIONS:   The previously placed stents are widely patent. Ongoing chest pain without enzyme abnormality or EKG changes suggests the possibility of an alternative explanation. Per treating team   Diagnostic Dominance: Right           Echocardiogram: 02/2019 IMPRESSIONS   1. Left ventricular ejection fraction, by estimation, is 55 to 60%. The  left ventricle has normal function. The left ventricle has no regional  wall motion  abnormalities. Left ventricular diastolic parameters were  normal.   2. Right ventricular systolic function is normal. The  right ventricular  size is normal. Tricuspid regurgitation signal is inadequate for assessing  PA pressure.   3. Left atrial size was upper normal.   4. Right atrial size was upper normal.   5. The mitral valve is grossly normal. Trivial mitral valve  regurgitation.   6. The aortic valve is tricuspid. Aortic valve regurgitation is mild.   7. The inferior vena cava is normal in size with greater than 50%  respiratory variability, suggesting right atrial pressure of 3 mmHg   Cardiac catheterization 07/05/2019   Prox LAD to Mid LAD lesion is 70% stenosed. A drug-eluting stent was successfully placed using a STENT RESOLUTE ONYX 3.0X22. Post intervention, there is a 0% residual stenosis. Mid LAD lesion is 50% stenosed. A drug-eluting stent was successfully placed using a STENT RESOLUTE ONYX 2.5X30. Post intervention, there is a 0% residual stenosis. 1st Mrg lesion is 95% stenosed. The left ventricular systolic function is normal. LV end diastolic pressure is normal. The left ventricular ejection fraction is 55-65% by visual estimate.   1. Severe 2 vessel obstructive CAD. Abnormal FFR of the LAD. 2. Normal LV function 3. Normal LVEDP 4. Successful PCI of the ostial to mid LAD with IVUS guidance and DES x 2.    Plan: DAPT for one year. Optimize medical therapy. Smoking cessation. If patient continues to have symptoms despite optimal therapy could consider PCI of the OM but this is a small branch and supplies a relatively small area of myocardium.   Diagnostic Dominance: Right   Intervention         Cardiac catheterization 07/20/2019   Previously placed Mid LAD drug eluting stent is widely patent. Previously placed Prox LAD to Mid LAD drug eluting stent is widely patent. 1st Mrg lesion is 95% stenosed. A drug-eluting stent was successfully placed using a STENT  RESOLUTE ONYX 2.25X26, postdilated to 2.6 mm. Post intervention, there is a 0% residual stenosis. The left ventricular systolic function is normal. LV end diastolic pressure is normal. The left ventricular ejection fraction is 55-65% by visual estimate. There is no aortic valve stenosis.   Continue aggressive secondary prevention.  Consider clopidogrel monotherapy after 12 months.    Watch overnight.  Home tomorrow.    He is interested in getting the COVID-19 vaccine.  I encouraged him to get the vaccines when possible.   Diagnostic Dominance: Right Intervention         Recent Labs: 12/23/2020: ALT 21; BUN 6; Creatinine, Ser 0.83; Hemoglobin 14.0; Platelets 203; Potassium 4.8; Sodium 142; TSH 1.710  Recent Lipid Panel    Component Value Date/Time   CHOL 128 12/23/2020 1415   TRIG 122 12/23/2020 1415   HDL 38 (L) 12/23/2020 1415   CHOLHDL 3.4 12/23/2020 1415   CHOLHDL 3.2 04/24/2020 0400   VLDL 41 (H) 04/24/2020 0400   LDLCALC 68 12/23/2020 1415   LDLCALC 71 09/01/2019 0859           Physical Exam:    VS:  BP 130/78   Pulse (!) 56   Ht 5\' 6"  (1.676 m)   Wt 187 lb (84.8 kg)   SpO2 96%   BMI 30.18 kg/m     Wt Readings from Last 3 Encounters:  12/30/20 187 lb (84.8 kg)  12/23/20 186 lb 0.6 oz (84.4 kg)  11/12/20 183 lb 12.8 oz (83.4 kg)     GEN:  Comfortable, NAD HEENT: Normal NECK: No JVD; No carotid bruits CARDIAC: RRR, no murmurs, rubs, gallops RESPIRATORY:  Clear to  auscultation without rales, wheezing or rhonchi  ABDOMEN: Soft, non-tender, non-distended MUSCULOSKELETAL:  No edema; No deformity  SKIN: Warm and dry NEUROLOGIC:  Alert and oriented x 3 PSYCHIATRIC:  Normal affect   ASSESSMENT:    1. Stable angina pectoris ([place])   2. CAD in native artery   3. Tobacco abuse   4. Hypercholesterolemia   5. Essential hypertension    PLAN:    In order of problems listed above:  #Known Multivessel CAD s/p PCI: Patient with known CAD s/p PCI to LAD  and OM1 with stable anatomy on cath in 03/2020. Now with increasing exertional chest pain and dyspnea on exertion. Specifically, the patient was able to mow his lawn and clean his house over the summer and now develops substernal chest pressure with minimal exertion (washing his hair in the shower, shopping, cleaning dishes) requiring him to sit down and rest. Symptoms have been progressively worsening over the past couple of months prompting him to come in today. No rest symptoms and discomfort resolves with sitting down and resting as well as ease with SL-NTG. Given significant change in symptoms compared to 03/2020, will repeat coronary angiography at this time. -Plan for [facility] given significant increase in anginal symptoms -Continue ASA 81mg  daily -Continue lipitor 80mg  daily -Continue zetia 10mg  daily -Continue plavix 75mg  daily -Continue atenolol 50mg  daily -Increase imdur to 90mg  daily -Start ranexa 500mg  BID  #HTN: Controlled at home -Continue atenolol 50mg  daily -Increase imdur to 90mg  daily -Continue amlodipine 5mg  daily  #HLD: LDL 68; goal <50. Will discuss PCSK9i at next visit. -Continue lipitor 80mg  daily -Continue zetia 10mg  daily -Will discuss PCSK9i at next visit  #Tobacco Abuse: -Quit successfully      Shared Decision Making/Informed Consent The risks [stroke (1 in 1000), death (1 in 1000), kidney failure [usually temporary] (1 in 500), bleeding (1 in 200), allergic reaction [possibly serious] (1 in 200)], benefits (diagnostic support and management of coronary artery disease) and alternatives of a cardiac catheterization were discussed in detail with Mr. Conery and he is willing to proceed.    Medication Adjustments/Labs and Tests Ordered: Current medicines are reviewed at length with the patient today.  Concerns regarding medicines are outlined above.  Orders Placed This Encounter  Procedures   Basic metabolic panel   CBC   Meds ordered this encounter   Medications   ranolazine (RANEXA) 500 MG 12 hr tablet    Sig: Take 1 tablet (500 mg total) by mouth 2 (two) times daily.    Dispense:  180 tablet    Refill:  3   isosorbide mononitrate (IMDUR) 60 MG 24 hr tablet    Sig: Take 1.5 tablets (90 mg total) by mouth daily.    Dispense:  135 tablet    Refill:  3    Patient Instructions  Medication Instructions:   Start Ranexa 500 mg Two Times Daily  Increase Imdur to 90 mg Daily   *If you need a refill on your cardiac medications before your next appointment, please call your pharmacy*   Lab Work: Your physician recommends that you return for lab work in: Today ( BMET, CBC)   If you have labs (blood work) drawn today and your tests are completely normal, you will receive your results only by: [employer] (if you have MyChart) OR A paper copy in the mail If you have any lab test that is abnormal or we need to change your treatment, we will call you to review the results.  Testing/Procedures: Your physician has requested that you have a cardiac catheterization. Cardiac catheterization is used to diagnose and/or treat various heart conditions. Doctors may recommend this procedure for a number of different reasons. The most common reason is to evaluate chest pain. Chest pain can be a symptom of coronary artery disease (CAD), and cardiac catheterization can show whether plaque is narrowing or blocking your heart's arteries. This procedure is also used to evaluate the valves, as well as measure the blood flow and oxygen levels in different parts of your heart. For further information please visit [email or web address]. Please follow instruction sheet, as given.    Follow-Up: At [facility], you and your health needs are our priority.  As part of our continuing mission to provide you with exceptional heart care, we have created designated Provider Care Teams.  These Care Teams include your primary Cardiologist (physician) and Advanced  Practice Providers (APPs -  Physician Assistants and Nurse Practitioners) who all work together to provide you with the care you need, when you need it.  We recommend signing up for the patient portal called "MyChart".  Sign up information is provided on this After Visit Summary.  MyChart is used to connect with patients for Virtual Visits (Telemedicine).  Patients are able to view lab/test results, encounter notes, upcoming appointments, etc.  Non-urgent messages can be sent to your provider as well.   To learn more about what you can do with MyChart, go to [email or web address].    Your next appointment:    3-4 weeks after Catheterization   The format for your next appointment:   In Person  Provider:   Dr. Johney Frame      Other Instructions Thank you for choosing [facility]!   [address] [place] [address] Dept: [phone] Loc: [place]  12/30/2020  You are scheduled for a Cardiac Catheterization on Thursday, December 8 with Dr. Lauree Chandler.  1. Please arrive at the [facility] (Main Entrance A) at [facility]: [address] [place], [place] [address] at 10:00 AM (This time is two hours before your procedure to ensure your preparation). Free valet parking service is available.   Special note: Every effort is made to have your procedure done on time. Please understand that emergencies sometimes delay scheduled procedures.  2. Diet: Do not eat solid foods after midnight.  The patient may have clear liquids until 5am upon the day of the procedure.  3. Labs: You will need to have blood drawn on Monday, December 5 at [place]. [address], [place]  Open: 7am - 6pm, Sat 8am - 12 noon   Phone: [phone]. You do not need to be fasting.  4. Medication instructions in preparation for your procedure:   Contrast Allergy:  No  On the morning of your procedure, take your Aspirin and any morning medicines NOT listed above.  You may use sips of water.  5. Plan for one night stay--bring personal belongings. 6. Bring a current list of your medications and current insurance cards. 7. You MUST have a responsible person to drive you home. 8. Someone MUST be with you the first 24 hours after you arrive home or your discharge will be delayed. 9. Please wear clothes that are easy to get on and off and wear slip-on shoes.  Thank you for allowing [place] to care for you!   -- [place] [facility]  [facility]    Signed, Freada Bergeron, MD  12/30/2020 3:06 PM    [place]

## 2020-12-30 NOTE — Patient Instructions (Addendum)
Medication Instructions:   Start Ranexa 500 mg Two Times Daily  Increase Imdur to 90 mg Daily   *If you need a refill on your cardiac medications before your next appointment, please call your pharmacy*   Lab Work: Your physician recommends that you return for lab work in: Today ( BMET, CBC)   If you have labs (blood work) drawn today and your tests are completely normal, you will receive your results only by: MyChart Message (if you have MyChart) OR A paper copy in the mail If you have any lab test that is abnormal or we need to change your treatment, we will call you to review the results.   Testing/Procedures: Your physician has requested that you have a cardiac catheterization. Cardiac catheterization is used to diagnose and/or treat various heart conditions. Doctors may recommend this procedure for a number of different reasons. The most common reason is to evaluate chest pain. Chest pain can be a symptom of coronary artery disease (CAD), and cardiac catheterization can show whether plaque is narrowing or blocking your heart's arteries. This procedure is also used to evaluate the valves, as well as measure the blood flow and oxygen levels in different parts of your heart. For further information please visit [email or web address].[email or web address]. Please follow instruction sheet, as given.    Follow-Up: At [facility], you and your health needs are our priority.  As part of our continuing mission to provide you with exceptional heart care, we have created designated Provider Care Teams.  These Care Teams include your primary Cardiologist (physician) and Advanced Practice Providers (APPs -  Physician Assistants and Nurse Practitioners) who all work together to provide you with the care you need, when you need it.  We recommend signing up for the patient portal called "MyChart".  Sign up information is provided on this After Visit Summary.  MyChart is used to connect with patients for Virtual Visits  (Telemedicine).  Patients are able to view lab/test results, encounter notes, upcoming appointments, etc.  Non-urgent messages can be sent to your provider as well.   To learn more about what you can do with MyChart, go to [email or web address].    Your next appointment:    3-4 weeks after Catheterization   The format for your next appointment:   In Person  Provider:   Dr. Johney Frame      Other Instructions Thank you for choosing [place]!   [place] [address] Dept: [phone] Loc: [place]  12/30/2020  You are scheduled for a Cardiac Catheterization on Thursday, December 8 with Dr. Lauree Chandler.  1. Please arrive at the [facility] (Main Entrance A) at [facility]: [address] [place], [place] [address] at 10:00 AM (This time is two hours before your procedure to ensure your preparation). Free valet parking service is available.   Special note: Every effort is made to have your procedure done on time. Please understand that emergencies sometimes delay scheduled procedures.  2. Diet: Do not eat solid foods after midnight.  The patient may have clear liquids until 5am upon the day of the procedure.  3. Labs: You will need to have blood drawn on Monday, December 5 at [place]. [address], [place]  Open: 7am - 6pm, Sat 8am - 12 noon   Phone: [phone]. You do not need to be fasting.  4. Medication instructions in preparation for  your procedure:   Contrast Allergy: No  On the morning of your procedure, take your Aspirin and any morning medicines NOT listed above.  You may use sips of water.  5. Plan for one night stay--bring personal belongings. 6. Bring a current list of your medications and current insurance cards. 7. You MUST have a responsible person to drive you home. 8. Someone MUST be with you  the first 24 hours after you arrive home or your discharge will be delayed. 9. Please wear clothes that are easy to get on and off and wear slip-on shoes.  Thank you for allowing [place] to care for you!   -- [place] [facility]

## 2020-12-30 NOTE — Progress Notes (Signed)
Cardiology Office Note:    Date:  12/30/2020   ID:  Bill Johnson, DOB October 21, 1955, MRN [id number]  PCP:  Lindell Spar, MD   [facility] Providers Cardiologist:  None {   Referring MD: Lindell Spar, MD    History of Present Illness:    Bill Johnson is a 65 y.o. male with a hx of HTN, GERD with esophagitis, COPD, OA, cervical spondylosis, anxiety, tobacco and alcohol abuse, CAD s/p DESx2 to o-mLAD 07/05/19 for CP and +CCTA, then DES to 95% OM1 stenosis 07/20/19 for residual symptoms who presents to clinic for urgent visit for progressive angina.  Patient admitted from 04/23/20-04/25/20 for chest pain. Cath done on that admission showed widely patent previously placed stents in mid-LAD (mild ISR), prox-to-mid LAD, and OM1 with no new lesions. He was then discharged on medical management. Of note, during previous admission for chest pain it was thought that his jailed diagonal may be the source of his discomfort.  Last saw [name] July, NP where he was doing well without anginal symptoms.  Today, the patient states that he had been doing well over the spring and summer, however, over the past couple of weeks he has been having significant chest pain with minimal exertion. Specifically, he used to be able to push mow the lawn, make the bed, do the dishes without issues but now has been having chest pressure, dyspnea on minimal exertion, and the sensation that his "heart is going to beat out of his chest." Has been taking nitro in addition to the imdur that has helped some, but the pain does not go away until he sits down and rests for several minutes. Denies any orthopnea, PND, LE edema. He has successfully quit smoking and drinking. Has been compliant with all medications. Blood pressure has been well controlled.    Past Medical History:  Diagnosis Date   Alcohol abuse    Allergy    seasonal   Anxiety    CAD (coronary artery disease)    a. 06/2019 cath - s/p successful PCI of  the ostial to mid LAD with IVUS guidance and DES x 2.   Depression    Essential hypertension    GERD (gastroesophageal reflux disease)    HOH (hard of hearing)    Hyperlipidemia    Hypertension    OA (osteoarthritis) of knee    Pulmonary nodules    a. seen on coronary CT 05/2019, will need OP f/u.   Rotator cuff tear arthropathy of both shoulders    On DISABILITY, non operative case   Shoulder pain    Tobacco abuse    Vertigo     Past Surgical History:  Procedure Laterality Date   CARDIAC CATHETERIZATION  06/2005   no significant CAD   COLONOSCOPY WITH PROPOFOL N/A 05/01/2019   Procedure: COLONOSCOPY WITH PROPOFOL;  Surgeon: Daneil Dolin, MD;  [facility];  Service: Endoscopy;  Laterality: N/A;  7:30am   COLONOSCOPY WITH PROPOFOL N/A 03/07/2020   Procedure: COLONOSCOPY WITH PROPOFOL;  Surgeon: Daneil Dolin, MD;  [facility];  Service: Endoscopy;  Laterality: N/A;  8:30am   CORONARY STENT INTERVENTION N/A 07/05/2019   Procedure: CORONARY STENT INTERVENTION;  Surgeon: Martinique, Peter M, MD;  Location: [place] [facility];  Service: Cardiovascular;  Laterality: N/A;   CORONARY STENT INTERVENTION N/A 07/20/2019   Procedure: CORONARY STENT INTERVENTION;  Surgeon: Jettie Booze, MD;  Location: [place] [facility];  Service: Cardiovascular;  Laterality: N/A;   ESOPHAGOGASTRODUODENOSCOPY  08/2005   Dr. Henrene Pastor: small hiatal hernia   HERNIA REPAIR  1983   INTRAVASCULAR PRESSURE WIRE/FFR STUDY N/A 07/05/2019   Procedure: INTRAVASCULAR PRESSURE WIRE/FFR STUDY;  Surgeon: Martinique, Peter M, MD;  Location: [place] [facility];  Service: Cardiovascular;  Laterality: N/A;   INTRAVASCULAR ULTRASOUND/IVUS N/A 07/05/2019   Procedure: Intravascular Ultrasound/IVUS;  Surgeon: Martinique, Peter M, MD;  Location: [place] [facility];  Service: Cardiovascular;  Laterality: N/A;   LEFT HEART CATH AND CORONARY ANGIOGRAPHY N/A 07/05/2019   Procedure: LEFT HEART CATH AND CORONARY ANGIOGRAPHY;   Surgeon: Martinique, Peter M, MD;  Location: [place] [facility];  Service: Cardiovascular;  Laterality: N/A;   LEFT HEART CATH AND CORONARY ANGIOGRAPHY N/A 07/20/2019   Procedure: LEFT HEART CATH AND CORONARY ANGIOGRAPHY;  Surgeon: Jettie Booze, MD;  Location: [place] [facility];  Service: Cardiovascular;  Laterality: N/A;   LEFT HEART CATH AND CORONARY ANGIOGRAPHY N/A 04/24/2020   Procedure: LEFT HEART CATH AND CORONARY ANGIOGRAPHY;  Surgeon: Belva Crome, MD;  Location: [place] [facility];  Service: Cardiovascular;  Laterality: N/A;   POLYPECTOMY  05/01/2019   Procedure: POLYPECTOMY;  Surgeon: Daneil Dolin, MD;  [facility];  Service: Endoscopy;;   POLYPECTOMY  03/07/2020   Procedure: POLYPECTOMY;  Surgeon: Daneil Dolin, MD;  [facility];  Service: Endoscopy;;   ROTATOR CUFF REPAIR Bilateral    TONSILLECTOMY  1977    Current Medications: Current Meds  Medication Sig   acetaminophen (TYLENOL) 500 MG tablet Take 500 mg by mouth as needed for mild pain or headache.    albuterol (VENTOLIN HFA) 108 (90 Base) MCG/ACT inhaler Inhale 2 puffs into the lungs every 6 (six) hours as needed for wheezing or shortness of breath.   amLODipine (NORVASC) 5 MG tablet Take 1 tablet (5 mg total) by mouth daily.   aspirin 81 MG tablet Take 81 mg by mouth daily.   atenolol (TENORMIN) 50 MG tablet Take 1 tablet (50 mg total) by mouth daily.   atorvastatin (LIPITOR) 80 MG tablet Take 1 tablet (80 mg total) by mouth every evening.   cetirizine (ZYRTEC) 10 MG tablet Take 10 mg by mouth daily.   clopidogrel (PLAVIX) 75 MG tablet Take 1 tablet (75 mg total) by mouth daily.   [employer] VITAMIN B-12 2000 MCG TBCR Take 2,000 mcg by mouth daily.   diazepam (VALIUM) 5 MG tablet Take 1 tablet (5 mg total) by mouth every 12 (twelve) hours as needed for anxiety.   ezetimibe (ZETIA) 10 MG tablet Take 1 tablet (10 mg total) by mouth daily.   isosorbide mononitrate (IMDUR) 60 MG 24 hr tablet Take 1.5  tablets (90 mg total) by mouth daily.   meclizine (ANTIVERT) 25 MG tablet TAKE 1 TABLET BY MOUTH THREE TIMES DAILY AS NEEDED FOR DIZZINESS   nitroGLYCERIN (NITROSTAT) 0.4 MG SL tablet Place 1 tablet (0.4 mg total) under the tongue every 5 (five) minutes as needed for chest pain (up to 3 doses).   pantoprazole (PROTONIX) 40 MG tablet Take 1 tablet (40 mg total) by mouth 2 (two) times daily.   ranolazine (RANEXA) 500 MG 12 hr tablet Take 1 tablet (500 mg total) by mouth 2 (two) times daily.   umeclidinium-vilanterol (ANORO ELLIPTA) 62.5-25 MCG/INH AEPB Inhale 1 puff into the lungs daily.   Vitamin D, Cholecalciferol, 25 MCG (1000 UT) TABS Take by mouth.   [DISCONTINUED] isosorbide mononitrate (IMDUR) 60 MG 24 hr  tablet Take 1 tablet (60 mg total) by mouth daily.     Allergies:   Patient has no known allergies.   Social History   Socioeconomic History   Marital status: Divorced    Spouse name: Not on file   Number of children: 2   Years of education: 12   Highest education level: Not on file  Occupational History   Occupation: Self-employed Designer, television/film set)  Tobacco Use   Smoking status: Former    Packs/day: 1.00    Years: 43.00    Pack years: 43.00    Types: Cigarettes    Start date: 04/15/1970    Quit date: 07/26/2020    Years since quitting: 0.4   Smokeless tobacco: Never  Vaping Use   Vaping Use: Never used  Substance and Sexual Activity   Alcohol use: Not Currently    Comment: Drinks 3-4 beers/day    Drug use: No   Sexual activity: Yes  Other Topics Concern   Not on file  Social History Narrative   Lives at home with nephew and niece in-law   Caffeine use: Drinks coffee/soda every day   Social Determinants of Radio broadcast assistant Strain: Not on file  Food Insecurity: Not on file  Transportation Needs: Not on file  Physical Activity: Not on file  Stress: Not on file  Social Connections: Not on file     Family History: The patient's family history includes  Cancer in his brother and another family member; Diabetes in his mother; Heart disease in his father and mother; Hypertension in his father and mother; Stroke in an other family member. There is no history of Colon cancer.  ROS:   Please see the history of present illness.    Review of Systems  Constitutional:  Positive for malaise/fatigue.  Respiratory:  Positive for shortness of breath.   Cardiovascular:  Positive for chest pain and palpitations. Negative for orthopnea, claudication, leg swelling and PND.  Neurological:  Negative for dizziness and loss of consciousness.    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  The vessel is very small in size and contains luminal irregularities. The right coronary is dominant.  The PDA arises from the distal portion of the mid vessel.  The origin of the PDA contains a 50 to 60% eccentric narrowing. Left main is widely patent. The left main gives origin to the LAD and what should be considered a ramus intermedius that then bifurcates.  The superior most branch of the ramus bifurcation contains a patent stent. The LAD is a large vessel and contains an ostial to mid vessel stent the jails a diagonal distally.  The diagonal contains ostial 50 to 70% narrowing. Normal left ventricular function.  LVEF 55%.   RECOMMENDATIONS:   The previously placed stents are widely patent. Ongoing chest pain without enzyme abnormality or EKG changes suggests the possibility of an alternative explanation. Per treating team   Diagnostic Dominance: Right           Echocardiogram: 02/2019 IMPRESSIONS   1. Left ventricular ejection fraction, by estimation, is 55 to 60%. The  left ventricle has normal function. The left ventricle has no regional  wall motion  abnormalities. Left ventricular diastolic parameters were  normal.   2. Right ventricular systolic function is normal. The  right ventricular  size is normal. Tricuspid regurgitation signal is inadequate for assessing  PA pressure.   3. Left atrial size was upper normal.   4. Right atrial size was upper normal.   5. The mitral valve is grossly normal. Trivial mitral valve  regurgitation.   6. The aortic valve is tricuspid. Aortic valve regurgitation is mild.   7. The inferior vena cava is normal in size with greater than 50%  respiratory variability, suggesting right atrial pressure of 3 mmHg   Cardiac catheterization 07/05/2019   Prox LAD to Mid LAD lesion is 70% stenosed. A drug-eluting stent was successfully placed using a STENT RESOLUTE ONYX 3.0X22. Post intervention, there is a 0% residual stenosis. Mid LAD lesion is 50% stenosed. A drug-eluting stent was successfully placed using a STENT RESOLUTE ONYX 2.5X30. Post intervention, there is a 0% residual stenosis. 1st Mrg lesion is 95% stenosed. The left ventricular systolic function is normal. LV end diastolic pressure is normal. The left ventricular ejection fraction is 55-65% by visual estimate.   1. Severe 2 vessel obstructive CAD. Abnormal FFR of the LAD. 2. Normal LV function 3. Normal LVEDP 4. Successful PCI of the ostial to mid LAD with IVUS guidance and DES x 2.    Plan: DAPT for one year. Optimize medical therapy. Smoking cessation. If patient continues to have symptoms despite optimal therapy could consider PCI of the OM but this is a small branch and supplies a relatively small area of myocardium.   Diagnostic Dominance: Right   Intervention         Cardiac catheterization 07/20/2019   Previously placed Mid LAD drug eluting stent is widely patent. Previously placed Prox LAD to Mid LAD drug eluting stent is widely patent. 1st Mrg lesion is 95% stenosed. A drug-eluting stent was successfully placed using a STENT  RESOLUTE ONYX 2.25X26, postdilated to 2.6 mm. Post intervention, there is a 0% residual stenosis. The left ventricular systolic function is normal. LV end diastolic pressure is normal. The left ventricular ejection fraction is 55-65% by visual estimate. There is no aortic valve stenosis.   Continue aggressive secondary prevention.  Consider clopidogrel monotherapy after 12 months.    Watch overnight.  Home tomorrow.    He is interested in getting the COVID-19 vaccine.  I encouraged him to get the vaccines when possible.   Diagnostic Dominance: Right Intervention         Recent Labs: 12/23/2020: ALT 21; BUN 6; Creatinine, Ser 0.83; Hemoglobin 14.0; Platelets 203; Potassium 4.8; Sodium 142; TSH 1.710  Recent Lipid Panel    Component Value Date/Time   CHOL 128 12/23/2020 1415   TRIG 122 12/23/2020 1415   HDL 38 (L) 12/23/2020 1415   CHOLHDL 3.4 12/23/2020 1415   CHOLHDL 3.2 04/24/2020 0400   VLDL 41 (H) 04/24/2020 0400   LDLCALC 68 12/23/2020 1415   LDLCALC 71 09/01/2019 0859           Physical Exam:    VS:  BP 130/78   Pulse (!) 56   Ht 5\' 6"  (1.676 m)   Wt 187 lb (84.8 kg)   SpO2 96%   BMI 30.18 kg/m     Wt Readings from Last 3 Encounters:  12/30/20 187 lb (84.8 kg)  12/23/20 186 lb 0.6 oz (84.4 kg)  11/12/20 183 lb 12.8 oz (83.4 kg)     GEN:  Comfortable, NAD HEENT: Normal NECK: No JVD; No carotid bruits CARDIAC: RRR, no murmurs, rubs, gallops RESPIRATORY:  Clear to  auscultation without rales, wheezing or rhonchi  ABDOMEN: Soft, non-tender, non-distended MUSCULOSKELETAL:  No edema; No deformity  SKIN: Warm and dry NEUROLOGIC:  Alert and oriented x 3 PSYCHIATRIC:  Normal affect   ASSESSMENT:    1. Stable angina pectoris ([place])   2. CAD in native artery   3. Tobacco abuse   4. Hypercholesterolemia   5. Essential hypertension    PLAN:    In order of problems listed above:  #Known Multivessel CAD s/p PCI: Patient with known CAD s/p PCI to LAD  and OM1 with stable anatomy on cath in 03/2020. Now with increasing exertional chest pain and dyspnea on exertion. Specifically, the patient was able to mow his lawn and clean his house over the summer and now develops substernal chest pressure with minimal exertion (washing his hair in the shower, shopping, cleaning dishes) requiring him to sit down and rest. Symptoms have been progressively worsening over the past couple of months prompting him to come in today. No rest symptoms and discomfort resolves with sitting down and resting as well as ease with SL-NTG. Given significant change in symptoms compared to 03/2020, will repeat coronary angiography at this time. -Plan for [facility] given significant increase in anginal symptoms -Continue ASA 81mg  daily -Continue lipitor 80mg  daily -Continue zetia 10mg  daily -Continue plavix 75mg  daily -Continue atenolol 50mg  daily -Increase imdur to 90mg  daily -Start ranexa 500mg  BID  #HTN: Controlled at home -Continue atenolol 50mg  daily -Increase imdur to 90mg  daily -Continue amlodipine 5mg  daily  #HLD: LDL 68; goal <50. Will discuss PCSK9i at next visit. -Continue lipitor 80mg  daily -Continue zetia 10mg  daily -Will discuss PCSK9i at next visit  #Tobacco Abuse: -Quit successfully      Shared Decision Making/Informed Consent The risks [stroke (1 in 1000), death (1 in 1000), kidney failure [usually temporary] (1 in 500), bleeding (1 in 200), allergic reaction [possibly serious] (1 in 200)], benefits (diagnostic support and management of coronary artery disease) and alternatives of a cardiac catheterization were discussed in detail with Mr. Hulbert and he is willing to proceed.    Medication Adjustments/Labs and Tests Ordered: Current medicines are reviewed at length with the patient today.  Concerns regarding medicines are outlined above.  Orders Placed This Encounter  Procedures   Basic metabolic panel   CBC   Meds ordered this encounter   Medications   ranolazine (RANEXA) 500 MG 12 hr tablet    Sig: Take 1 tablet (500 mg total) by mouth 2 (two) times daily.    Dispense:  180 tablet    Refill:  3   isosorbide mononitrate (IMDUR) 60 MG 24 hr tablet    Sig: Take 1.5 tablets (90 mg total) by mouth daily.    Dispense:  135 tablet    Refill:  3    Patient Instructions  Medication Instructions:   Start Ranexa 500 mg Two Times Daily  Increase Imdur to 90 mg Daily   *If you need a refill on your cardiac medications before your next appointment, please call your pharmacy*   Lab Work: Your physician recommends that you return for lab work in: Today ( BMET, CBC)   If you have labs (blood work) drawn today and your tests are completely normal, you will receive your results only by: [employer] (if you have MyChart) OR A paper copy in the mail If you have any lab test that is abnormal or we need to change your treatment, we will call you to review the results.  Testing/Procedures: Your physician has requested that you have a cardiac catheterization. Cardiac catheterization is used to diagnose and/or treat various heart conditions. Doctors may recommend this procedure for a number of different reasons. The most common reason is to evaluate chest pain. Chest pain can be a symptom of coronary artery disease (CAD), and cardiac catheterization can show whether plaque is narrowing or blocking your heart's arteries. This procedure is also used to evaluate the valves, as well as measure the blood flow and oxygen levels in different parts of your heart. For further information please visit [email or web address]. Please follow instruction sheet, as given.    Follow-Up: At [facility], you and your health needs are our priority.  As part of our continuing mission to provide you with exceptional heart care, we have created designated Provider Care Teams.  These Care Teams include your primary Cardiologist (physician) and Advanced  Practice Providers (APPs -  Physician Assistants and Nurse Practitioners) who all work together to provide you with the care you need, when you need it.  We recommend signing up for the patient portal called "MyChart".  Sign up information is provided on this After Visit Summary.  MyChart is used to connect with patients for Virtual Visits (Telemedicine).  Patients are able to view lab/test results, encounter notes, upcoming appointments, etc.  Non-urgent messages can be sent to your provider as well.   To learn more about what you can do with MyChart, go to [email or web address].    Your next appointment:    3-4 weeks after Catheterization   The format for your next appointment:   In Person  Provider:   Dr. Johney Frame      Other Instructions Thank you for choosing [facility]!   [address] [address] Dept: [phone] Loc: [place]  12/30/2020  You are scheduled for a Cardiac Catheterization on Thursday, December 8 with Dr. Lauree Chandler.  1. Please arrive at the [facility] (Main Entrance A) at [facility]: [address] [place], [place] [address] at 10:00 AM (This time is two hours before your procedure to ensure your preparation). Free valet parking service is available.   Special note: Every effort is made to have your procedure done on time. Please understand that emergencies sometimes delay scheduled procedures.  2. Diet: Do not eat solid foods after midnight.  The patient may have clear liquids until 5am upon the day of the procedure.  3. Labs: You will need to have blood drawn on Monday, December 5 at [facility], [place]  Open: 7am - 6pm, Sat 8am - 12 noon   Phone: [phone]. You do not need to be fasting.  4. Medication instructions in preparation for your procedure:   Contrast Allergy:  No  On the morning of your procedure, take your Aspirin and any morning medicines NOT listed above.  You may use sips of water.  5. Plan for one night stay--bring personal belongings. 6. Bring a current list of your medications and current insurance cards. 7. You MUST have a responsible person to drive you home. 8. Someone MUST be with you the first 24 hours after you arrive home or your discharge will be delayed. 9. Please wear clothes that are easy to get on and off and wear slip-on shoes.  Thank you for allowing [place] to care for you!   -- [facility]  [facility]    Signed, Freada Bergeron, MD  12/30/2020 3:06 PM    [place]

## 2021-01-01 ENCOUNTER — Telehealth: Payer: Self-pay | Admitting: *Deleted

## 2021-01-01 NOTE — Telephone Encounter (Signed)
Cardiac catheterization scheduled at [facility] for: Thursday January 02, 2021 [facility] Main Entrance A [facility]) at: 10 AM   Diet-no solid food after midnight prior to cath, clear liquids until 5 AM day of procedure.  Medication instructions for procedure: -Usual morning medications can be taken pre-cath with sips of water including aspirin 81 mg and Plavix 75 mg.    Confirmed patient has responsible adult to drive home post procedure and be with patient first 24 hours after arriving home.  [facility] does allow one visitor to accompany you and wait in the hospital waiting room while you are there for your procedure. You and your visitor will be asked to wear a mask once you enter the hospital.   Patient reports does not currently have any new symptoms concerning for COVID-19 and no household members with COVID-19 like illness.    Reviewed procedure/mask/visitor instructions with patient's brother (DPR), Liliane Channel.

## 2021-01-02 ENCOUNTER — Encounter (HOSPITAL_COMMUNITY)
Admission: RE | Disposition: A | Discharge status: Home / Self Care | Payer: Self-pay | Source: Home / Self Care | Attending: Cardiovascular Disease

## 2021-01-02 ENCOUNTER — Ambulatory Visit (HOSPITAL_COMMUNITY)
Admission: RE | Admit: 2021-01-02 | Discharge: 2021-01-02 | Disposition: A | Discharge status: Home / Self Care | Payer: Medicare Other | Attending: Cardiovascular Disease | Admitting: Cardiovascular Disease

## 2021-01-02 DIAGNOSIS — Z955 Presence of coronary angioplasty implant and graft: Secondary | ICD-10-CM | POA: Diagnosis not present

## 2021-01-02 DIAGNOSIS — Z79899 Other long term (current) drug therapy: Secondary | ICD-10-CM | POA: Diagnosis not present

## 2021-01-02 DIAGNOSIS — F419 Anxiety disorder, unspecified: Secondary | ICD-10-CM | POA: Insufficient documentation

## 2021-01-02 DIAGNOSIS — I10 Essential (primary) hypertension: Secondary | ICD-10-CM | POA: Insufficient documentation

## 2021-01-02 DIAGNOSIS — E7800 Pure hypercholesterolemia, unspecified: Secondary | ICD-10-CM | POA: Diagnosis not present

## 2021-01-02 DIAGNOSIS — Z87891 Personal history of nicotine dependence: Secondary | ICD-10-CM | POA: Insufficient documentation

## 2021-01-02 DIAGNOSIS — I25119 Atherosclerotic heart disease of native coronary artery with unspecified angina pectoris: Secondary | ICD-10-CM | POA: Diagnosis not present

## 2021-01-02 DIAGNOSIS — E785 Hyperlipidemia, unspecified: Secondary | ICD-10-CM | POA: Diagnosis not present

## 2021-01-02 DIAGNOSIS — I25118 Atherosclerotic heart disease of native coronary artery with other forms of angina pectoris: Secondary | ICD-10-CM

## 2021-01-02 DIAGNOSIS — Z7982 Long term (current) use of aspirin: Secondary | ICD-10-CM | POA: Insufficient documentation

## 2021-01-02 DIAGNOSIS — J449 Chronic obstructive pulmonary disease, unspecified: Secondary | ICD-10-CM | POA: Insufficient documentation

## 2021-01-02 HISTORY — PX: LEFT HEART CATH AND CORONARY ANGIOGRAPHY: CATH118249

## 2021-01-02 SURGERY — LEFT HEART CATH AND CORONARY ANGIOGRAPHY
Anesthesia: LOCAL

## 2021-01-02 MED ORDER — VERAPAMIL HCL 2.5 MG/ML IV SOLN
INTRAVENOUS | Status: DC | PRN
  Administered 2021-01-02: 10 mL via INTRA_ARTERIAL

## 2021-01-02 MED ORDER — SODIUM CHLORIDE 0.9% FLUSH: 3.0000 mL | INTRAVENOUS | Status: DC | PRN

## 2021-01-02 MED ORDER — HYDRALAZINE HCL 20 MG/ML IJ SOLN: 10.0000 mg | INTRAMUSCULAR | Status: DC | PRN

## 2021-01-02 MED ORDER — HEPARIN (PORCINE) IN NACL 2000-0.9 UNIT/L-% IV SOLN
INTRAVENOUS | Status: DC | PRN
  Administered 2021-01-02: 1000 mL

## 2021-01-02 MED ORDER — SODIUM CHLORIDE 0.9 % IV SOLN: 250.0000 mL | INTRAVENOUS | Status: DC | PRN

## 2021-01-02 MED ORDER — FENTANYL CITRATE (PF) 100 MCG/2ML IJ SOLN
INTRAMUSCULAR | Status: AC
  Filled 2021-01-02: qty 2

## 2021-01-02 MED ORDER — SODIUM CHLORIDE 0.9% FLUSH: 3.0000 mL | Freq: Two times a day (BID) | INTRAVENOUS | Status: DC

## 2021-01-02 MED ORDER — MIDAZOLAM HCL 2 MG/2ML IJ SOLN
INTRAMUSCULAR | Status: AC
  Filled 2021-01-02: qty 2

## 2021-01-02 MED ORDER — ONDANSETRON HCL 4 MG/2ML IJ SOLN: 4.0000 mg | Freq: Four times a day (QID) | INTRAMUSCULAR | Status: DC | PRN

## 2021-01-02 MED ORDER — HEPARIN SODIUM (PORCINE) 1000 UNIT/ML IJ SOLN
INTRAMUSCULAR | Status: DC | PRN
  Administered 2021-01-02: 4000 [IU] via INTRAVENOUS

## 2021-01-02 MED ORDER — CLOPIDOGREL BISULFATE 75 MG PO TABS: 75.0000 mg | ORAL_TABLET | ORAL | Status: DC

## 2021-01-02 MED ORDER — FENTANYL CITRATE (PF) 100 MCG/2ML IJ SOLN
INTRAMUSCULAR | Status: DC | PRN
  Administered 2021-01-02: 50 ug via INTRAVENOUS

## 2021-01-02 MED ORDER — HEPARIN SODIUM (PORCINE) 1000 UNIT/ML IJ SOLN
INTRAMUSCULAR | Status: AC
  Filled 2021-01-02: qty 10

## 2021-01-02 MED ORDER — ACETAMINOPHEN 325 MG PO TABS: 650.0000 mg | ORAL_TABLET | ORAL | Status: DC | PRN

## 2021-01-02 MED ORDER — HEPARIN (PORCINE) IN NACL 2000-0.9 UNIT/L-% IV SOLN
INTRAVENOUS | Status: AC
  Filled 2021-01-02: qty 1000

## 2021-01-02 MED ORDER — SODIUM CHLORIDE 0.9 % WEIGHT BASED INFUSION: 1.0000 mL/kg/h | INTRAVENOUS | Status: DC

## 2021-01-02 MED ORDER — ASPIRIN 81 MG PO CHEW: 81.0000 mg | CHEWABLE_TABLET | ORAL | Status: DC

## 2021-01-02 MED ORDER — IOHEXOL 350 MG/ML SOLN
INTRAVENOUS | Status: DC | PRN
  Administered 2021-01-02: 60 mL

## 2021-01-02 MED ORDER — LABETALOL HCL 5 MG/ML IV SOLN: 10.0000 mg | INTRAVENOUS | Status: DC | PRN

## 2021-01-02 MED ORDER — MIDAZOLAM HCL 2 MG/2ML IJ SOLN
INTRAMUSCULAR | Status: DC | PRN
  Administered 2021-01-02: 2 mg via INTRAVENOUS

## 2021-01-02 MED ORDER — SODIUM CHLORIDE 0.9 % WEIGHT BASED INFUSION
3.0000 mL/kg/h | INTRAVENOUS | Status: AC
  Administered 2021-01-02: 3 mL/kg/h via INTRAVENOUS

## 2021-01-02 MED ORDER — LIDOCAINE HCL (PF) 1 % IJ SOLN
INTRAMUSCULAR | Status: DC | PRN
  Administered 2021-01-02: 2 mL

## 2021-01-02 MED ORDER — SODIUM CHLORIDE 0.9 % IV SOLN: INTRAVENOUS | Status: AC

## 2021-01-02 MED ORDER — VERAPAMIL HCL 2.5 MG/ML IV SOLN
INTRAVENOUS | Status: AC
  Filled 2021-01-02: qty 2

## 2021-01-02 MED ORDER — LIDOCAINE HCL (PF) 1 % IJ SOLN
INTRAMUSCULAR | Status: AC
  Filled 2021-01-02: qty 30

## 2021-01-02 SURGICAL SUPPLY — 9 items

## 2021-01-02 NOTE — Interval H&P Note (Signed)
History and Physical Interval Note:  01/02/2021 11:55 AM  Bill Johnson  has presented today for surgery, with the diagnosis of cad.  The various methods of treatment have been discussed with the patient and family. After consideration of risks, benefits and other options for treatment, the patient has consented to  Procedure(s): LEFT HEART CATH AND CORONARY ANGIOGRAPHY (N/A) as a surgical intervention.  The patient's history has been reviewed, patient examined, no change in status, stable for surgery.  I have reviewed the patient's chart and labs.  Questions were answered to the patient's satisfaction.    Cath Lab Visit (complete for each Cath Lab visit)  Clinical Evaluation Leading to the Procedure:   ACS: No.  Non-ACS:    Anginal Classification: CCS III  Anti-ischemic medical therapy: Maximal Therapy (2 or more classes of medications)  Non-Invasive Test Results: No non-invasive testing performed  Prior CABG: No previous CABG        Lauree Chandler

## 2021-01-03 ENCOUNTER — Encounter (HOSPITAL_COMMUNITY): Payer: Self-pay | Admitting: Cardiovascular Disease

## 2021-01-09 ENCOUNTER — Telehealth: Payer: Self-pay | Admitting: Cardiology

## 2021-01-09 DIAGNOSIS — M19012 Primary osteoarthritis, left shoulder: Secondary | ICD-10-CM | POA: Diagnosis not present

## 2021-01-09 DIAGNOSIS — M19011 Primary osteoarthritis, right shoulder: Secondary | ICD-10-CM | POA: Diagnosis not present

## 2021-01-09 DIAGNOSIS — G8929 Other chronic pain: Secondary | ICD-10-CM | POA: Diagnosis not present

## 2021-01-09 NOTE — Telephone Encounter (Signed)
Pt walked in saying its been a week since cath put in on left side and it has a tender knot on the side of his hand and, he really notices it when he tries to do anything with his hand. Wants someone to take a look at it.Marland Kitchen

## 2021-01-09 NOTE — Telephone Encounter (Signed)
Patient had left heart cath 01/02/21. He has soft , round area (approx 2.5 cm circumference ) at radial cath site on right wrist. No heat or bruising noted. Full sensation in hand, brisk cap reflex in fingers and strong radial pulse. Patient states he did nothing "crazy" with right hand. Denies heavy lifting. He states he has had 3 caths performed in the last year and a half at this site.I advised him to monitor and expect area to resolve soon.He will call us back if anything changes.

## 2021-01-24 ENCOUNTER — Other Ambulatory Visit: Payer: Self-pay | Admitting: *Deleted

## 2021-01-24 ENCOUNTER — Telehealth: Payer: Self-pay | Admitting: Internal Medicine

## 2021-01-24 DIAGNOSIS — I2510 Atherosclerotic heart disease of native coronary artery without angina pectoris: Secondary | ICD-10-CM

## 2021-01-24 MED ORDER — CLOPIDOGREL BISULFATE 75 MG PO TABS: 75.0000 mg | ORAL_TABLET | Freq: Every day | ORAL | 0 refills | Status: DC

## 2021-01-24 NOTE — Telephone Encounter (Signed)
Medication sent to pharmacy  

## 2021-01-24 NOTE — Telephone Encounter (Signed)
Pt came by for refill on   clopidogrel (PLAVIX) 75 MG tablet

## 2021-02-05 ENCOUNTER — Telehealth: Payer: Self-pay | Admitting: Internal Medicine

## 2021-02-05 ENCOUNTER — Other Ambulatory Visit: Payer: Self-pay | Admitting: *Deleted

## 2021-02-05 MED ORDER — MECLIZINE HCL 25 MG PO TABS: ORAL_TABLET | ORAL | 2 refills | Status: AC

## 2021-02-05 NOTE — Telephone Encounter (Signed)
Pt came by office for refill on    meclizine (ANTIVERT) 25 MG tablet

## 2021-02-05 NOTE — Telephone Encounter (Signed)
Medication refill sent to pharmacy  

## 2021-02-13 DIAGNOSIS — H905 Unspecified sensorineural hearing loss: Secondary | ICD-10-CM | POA: Diagnosis not present

## 2021-02-15 NOTE — Progress Notes (Signed)
Cardiology Office Note:    Date:  02/18/2021   ID:  Bill Johnson, DOB 11-19-1955, MRN [id number]  PCP:  Lindell Spar, MD   [facility] Providers Cardiologist:  None {   Referring MD: Lindell Spar, MD    History of Present Illness:    Bill Johnson is a 66 y.o. male with a hx of HTN, GERD with esophagitis, COPD, OA, cervical spondylosis, anxiety, tobacco and alcohol abuse, CAD s/p DESx2 to o-mLAD 07/05/19 for CP and +CCTA, then DES to 95% OM1 stenosis 07/20/19 for residual symptoms with recent cath for chest pain with stable disease who now presents to clinic for follow-up.  Patient admitted from 04/23/20-04/25/20 for chest pain. Cath done on that admission showed widely patent previously placed stents in mid-LAD (mild ISR), prox-to-mid LAD, and OM1 with no new lesions. He was then discharged on medical management. Of note, during previous admission for chest pain it was thought that his jailed diagonal may be the source of his discomfort.  Saw me in clinic on 12/30/20 as urgent visit where he was having significant chest pain with minimal exertion and decreased exercise capacity. We referred for repeat cath on 01/02/2021 patent LAD and OM stents, moderate D1 disease, moderate PDA disease which was overall stable. Recommended for continued medical management.  Today, the patient states he feels much better. Chest pain is better controlled on the ranexa and is only very mild when it occurs. He states he continues to have dyspnea on exertion and this has remained unchanged. He states that he is concerned that this may be related to his underlying COPD and is wanting to see a lung doctors to assess further.  Otherwise, he denies any orthopnea, PND, LE edema, dizziness, palpitations or presyncope/syncope. Complaint with all medications. Blood pressure well controlled. He is trying to walk more and lose weight. Able to walk about 1 mile per day.    Past Medical History:  Diagnosis  Date   Alcohol abuse    Allergy    seasonal   Anxiety    CAD (coronary artery disease)    a. 06/2019 cath - s/p successful PCI of the ostial to mid LAD with IVUS guidance and DES x 2.   Depression    Essential hypertension    GERD (gastroesophageal reflux disease)    HOH (hard of hearing)    Hyperlipidemia    Hypertension    OA (osteoarthritis) of knee    Pulmonary nodules    a. seen on coronary CT 05/2019, will need OP f/u.   Rotator cuff tear arthropathy of both shoulders    On DISABILITY, non operative case   Shoulder pain    Tobacco abuse    Vertigo     Past Surgical History:  Procedure Laterality Date   CARDIAC CATHETERIZATION  06/2005   no significant CAD   COLONOSCOPY WITH PROPOFOL N/A 05/01/2019   Procedure: COLONOSCOPY WITH PROPOFOL;  Surgeon: Daneil Dolin, MD;  [facility];  Service: Endoscopy;  Laterality: N/A;  7:30am   COLONOSCOPY WITH PROPOFOL N/A 03/07/2020   Procedure: COLONOSCOPY WITH PROPOFOL;  Surgeon: Daneil Dolin, MD;  [facility];  Service: Endoscopy;  Laterality: N/A;  8:30am   CORONARY STENT INTERVENTION N/A 07/05/2019   Procedure: CORONARY STENT INTERVENTION;  Surgeon: Martinique, Peter M, MD;  Location: [place] [facility];  Service: Cardiovascular;  Laterality: N/A;   CORONARY STENT INTERVENTION N/A 07/20/2019   Procedure: CORONARY STENT INTERVENTION;  Surgeon: Jettie Booze, MD;  Location: [place] [facility];  Service: Cardiovascular;  Laterality: N/A;   ESOPHAGOGASTRODUODENOSCOPY  08/2005   Dr. Henrene Pastor: small hiatal hernia   HERNIA REPAIR  1983   INTRAVASCULAR PRESSURE WIRE/FFR STUDY N/A 07/05/2019   Procedure: INTRAVASCULAR PRESSURE WIRE/FFR STUDY;  Surgeon: Martinique, Peter M, MD;  Location: [place] [facility];  Service: Cardiovascular;  Laterality: N/A;   INTRAVASCULAR ULTRASOUND/IVUS N/A 07/05/2019   Procedure: Intravascular Ultrasound/IVUS;  Surgeon: Martinique, Peter M, MD;  Location: [place] [facility];  Service: Cardiovascular;   Laterality: N/A;   LEFT HEART CATH AND CORONARY ANGIOGRAPHY N/A 07/05/2019   Procedure: LEFT HEART CATH AND CORONARY ANGIOGRAPHY;  Surgeon: Martinique, Peter M, MD;  Location: [place] [facility];  Service: Cardiovascular;  Laterality: N/A;   LEFT HEART CATH AND CORONARY ANGIOGRAPHY N/A 07/20/2019   Procedure: LEFT HEART CATH AND CORONARY ANGIOGRAPHY;  Surgeon: Jettie Booze, MD;  Location: [place] [facility];  Service: Cardiovascular;  Laterality: N/A;   LEFT HEART CATH AND CORONARY ANGIOGRAPHY N/A 04/24/2020   Procedure: LEFT HEART CATH AND CORONARY ANGIOGRAPHY;  Surgeon: Belva Crome, MD;  Location: [place] [facility];  Service: Cardiovascular;  Laterality: N/A;   LEFT HEART CATH AND CORONARY ANGIOGRAPHY N/A 01/02/2021   Procedure: LEFT HEART CATH AND CORONARY ANGIOGRAPHY;  Surgeon: Burnell Blanks, MD;  Location: [place] [facility];  Service: Cardiovascular;  Laterality: N/A;   POLYPECTOMY  05/01/2019   Procedure: POLYPECTOMY;  Surgeon: Daneil Dolin, MD;  [facility];  Service: Endoscopy;;   POLYPECTOMY  03/07/2020   Procedure: POLYPECTOMY;  Surgeon: Daneil Dolin, MD;  [facility];  Service: Endoscopy;;   ROTATOR CUFF REPAIR Bilateral    TONSILLECTOMY  1977    Current Medications: Current Meds  Medication Sig   acetaminophen (TYLENOL) 500 MG tablet Take 1,000 mg by mouth every 6 (six) hours as needed for mild pain or headache.   albuterol (VENTOLIN HFA) 108 (90 Base) MCG/ACT inhaler Inhale 2 puffs into the lungs every 6 (six) hours as needed for wheezing or shortness of breath.   amLODipine (NORVASC) 5 MG tablet Take 1 tablet (5 mg total) by mouth daily.   aspirin 81 MG tablet Take 81 mg by mouth daily.   atenolol (TENORMIN) 50 MG tablet Take 1 tablet (50 mg total) by mouth daily.   atorvastatin (LIPITOR) 80 MG tablet Take 1 tablet (80 mg total) by mouth every evening.   cetirizine (ZYRTEC) 10 MG tablet Take 10 mg by mouth daily.   Cholecalciferol  (VITAMIN D3) 50 MCG (2000 UT) CAPS Take 2,000 Units by mouth daily.   clopidogrel (PLAVIX) 75 MG tablet Take 1 tablet (75 mg total) by mouth daily.   CVS VITAMIN B-12 2000 MCG TBCR Take 2,000 mcg by mouth daily.   diazepam (VALIUM) 5 MG tablet Take 1 tablet (5 mg total) by mouth every 12 (twelve) hours as needed for anxiety.   isosorbide mononitrate (IMDUR) 60 MG 24 hr tablet Take 1.5 tablets (90 mg total) by mouth daily.   meclizine (ANTIVERT) 25 MG tablet TAKE 1 TABLET BY MOUTH THREE TIMES DAILY AS NEEDED FOR DIZZINESS   nitroGLYCERIN (NITROSTAT) 0.4 MG SL tablet Place 1 tablet (0.4 mg total) under the tongue every 5 (five) minutes as needed for chest pain (up to 3 doses).   pantoprazole (PROTONIX) 40 MG tablet Take 1 tablet (40 mg total) by mouth 2 (two) times daily.   ranolazine (RANEXA)  500 MG 12 hr tablet Take 1 tablet (500 mg total) by mouth 2 (two) times daily.   umeclidinium-vilanterol (ANORO ELLIPTA) 62.5-25 MCG/INH AEPB Inhale 1 puff into the lungs daily.   [DISCONTINUED] ezetimibe (ZETIA) 10 MG tablet Take 1 tablet (10 mg total) by mouth daily.     Allergies:   Patient has no known allergies.   Social History   Socioeconomic History   Marital status: Divorced    Spouse name: Not on file   Number of children: 2   Years of education: 12   Highest education level: Not on file  Occupational History   Occupation: Self-employed Designer, television/film set)  Tobacco Use   Smoking status: Former    Packs/day: 1.00    Years: 43.00    Pack years: 43.00    Types: Cigarettes    Start date: 04/15/1970    Quit date: 07/26/2020    Years since quitting: 0.5   Smokeless tobacco: Never  Vaping Use   Vaping Use: Never used  Substance and Sexual Activity   Alcohol use: Not Currently    Comment: Drinks 3-4 beers/day    Drug use: No   Sexual activity: Yes  Other Topics Concern   Not on file  Social History Narrative   Lives at home with nephew and niece in-law   Caffeine use: Drinks coffee/soda  every day   Social Determinants of Radio broadcast assistant Strain: Not on file  Food Insecurity: Not on file  Transportation Needs: Not on file  Physical Activity: Not on file  Stress: Not on file  Social Connections: Not on file     Family History: The patient's family history includes Cancer in his brother and another family member; Diabetes in his mother; Heart disease in his father and mother; Hypertension in his father and mother; Stroke in an other family member. There is no history of Colon cancer.  ROS:   Please see the history of present illness.    Review of Systems  Constitutional:  Positive for malaise/fatigue.  Respiratory:  Positive for shortness of breath.   Cardiovascular:  Positive for chest pain. Negative for palpitations, orthopnea, claudication, leg swelling and PND.  Gastrointestinal:  Negative for blood in stool and melena.  Genitourinary:  Negative for hematuria.  Musculoskeletal:  Positive for back pain and joint pain.  Neurological:  Negative for dizziness and loss of consciousness.    EKGs/Labs/Other Studies Reviewed:    The following studies were reviewed today: 01/02/21:   2nd Diag lesion is 60% stenosed.   RPDA lesion is 60% stenosed.   RV Branch lesion is 10% stenosed.   1st Mrg lesion is 10% stenosed.   Mid LAD lesion is 30% stenosed.   The left ventricular systolic function is normal.   LV end diastolic pressure is normal.   The left ventricular ejection fraction is 55-65% by visual estimate.   There is no mitral valve regurgitation.   Patent mid LAD stents without significant restenosis. Moderate ostial Diagonal branch stenosis, jailed by LAD stent with normal flow.  Patent obtuse marginal branch stent without restenosis. This vessel arises from what may be a large intermediate branch. There is a small anomalous branch that arises from the RCA ostium that may represent a small Circumflex branch Large dominant RCA with a high take off of  the PDA. The proximal segment of the PDA has a 50-60% stenosis that is unchanged from prior cardiac caths.  Normal LV systolic function.  Mild elevation LV filling pressures,  LVEDP 17 mmHg.    Recommendations: Continue medical management of CAD. I suspect that his dyspnea is secondary to deconditioning but cannot exclude microvascular disease.    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  The vessel is very small in size and contains luminal irregularities. The right coronary is dominant.  The PDA arises from the distal portion of the mid vessel.  The origin of the PDA contains a 50 to 60% eccentric narrowing. Left main is widely patent. The left main gives origin to the LAD and what should be considered a ramus intermedius that then bifurcates.  The superior most branch of the ramus bifurcation contains a patent stent. The LAD is a large vessel and contains an ostial to mid vessel stent the jails a diagonal distally.  The diagonal contains ostial 50 to 70% narrowing. Normal left ventricular function.  LVEF 55%.   RECOMMENDATIONS:   The previously placed stents are widely patent. Ongoing chest pain without enzyme abnormality or EKG changes suggests the possibility of an alternative explanation. Per treating team   Diagnostic Dominance: Right           Echocardiogram: 02/2019 IMPRESSIONS   1. Left ventricular ejection fraction, by estimation, is 55 to 60%. The  left ventricle has normal function. The left ventricle has no regional  wall motion abnormalities. Left ventricular diastolic parameters were  normal.   2. Right ventricular systolic function is normal. The right ventricular  size is normal. Tricuspid regurgitation signal is inadequate for assessing  PA pressure.   3. Left atrial size was upper normal.   4. Right atrial size was upper normal.   5. The mitral  valve is grossly normal. Trivial mitral valve  regurgitation.   6. The aortic valve is tricuspid. Aortic valve regurgitation is mild.   7. The inferior vena cava is normal in size with greater than 50%  respiratory variability, suggesting right atrial pressure of 3 mmHg   Cardiac catheterization 07/05/2019   Prox LAD to Mid LAD lesion is 70% stenosed. A drug-eluting stent was successfully placed using a STENT RESOLUTE ONYX 3.0X22. Post intervention, there is a 0% residual stenosis. Mid LAD lesion is 50% stenosed. A drug-eluting stent was successfully placed using a STENT RESOLUTE ONYX 2.5X30. Post intervention, there is a 0% residual stenosis. 1st Mrg lesion is 95% stenosed. The left ventricular systolic function is normal. LV end diastolic pressure is normal. The left ventricular ejection fraction is 55-65% by visual estimate.   1. Severe 2 vessel obstructive CAD. Abnormal FFR of the LAD. 2. Normal LV function 3. Normal LVEDP 4. Successful PCI of the ostial to mid LAD with IVUS guidance and DES x 2.    Plan: DAPT for one year. Optimize medical therapy. Smoking cessation. If patient continues to have symptoms despite optimal therapy could consider PCI of the OM but this is a small branch and supplies a relatively small area of myocardium.   Diagnostic Dominance: Right   Intervention         Cardiac catheterization 07/20/2019   Previously placed Mid LAD drug eluting stent is widely patent. Previously placed Prox LAD to Mid LAD drug eluting stent is widely patent. 1st Mrg lesion is 95% stenosed. A drug-eluting stent was successfully placed using a STENT RESOLUTE ONYX 2.25X26, postdilated to 2.6 mm. Post intervention, there is a 0% residual stenosis. The left ventricular systolic  function is normal. LV end diastolic pressure is normal. The left ventricular ejection fraction is 55-65% by visual estimate. There is no aortic valve stenosis.   Continue aggressive secondary  prevention.  Consider clopidogrel monotherapy after 12 months.    Watch overnight.  Home tomorrow.    He is interested in getting the COVID-19 vaccine.  I encouraged him to get the vaccines when possible.   Diagnostic Dominance: Right Intervention         Recent Labs: 12/23/2020: ALT 21; TSH 1.710 12/30/2020: BUN 9; Creatinine, Ser 0.82; Hemoglobin 14.5; Platelets 234; Potassium 3.8; Sodium 137  Recent Lipid Panel    Component Value Date/Time   CHOL 128 12/23/2020 1415   TRIG 122 12/23/2020 1415   HDL 38 (L) 12/23/2020 1415   CHOLHDL 3.4 12/23/2020 1415   CHOLHDL 3.2 04/24/2020 0400   VLDL 41 (H) 04/24/2020 0400   LDLCALC 68 12/23/2020 1415   LDLCALC 71 09/01/2019 0859           Physical Exam:    VS:  BP 108/64    Pulse 62    Ht 5\' 6"  (1.676 m)    Wt 180 lb (81.6 kg)    SpO2 97%    BMI 29.05 kg/m     Wt Readings from Last 3 Encounters:  02/18/21 180 lb (81.6 kg)  01/02/21 187 lb (84.8 kg)  12/30/20 187 lb (84.8 kg)     GEN:  Comfortable HEENT: Normal NECK: No JVD; No carotid bruits CARDIAC: RRR, no murmurs RESPIRATORY:  Clear to auscultation without rales, wheezing or rhonchi  ABDOMEN: Soft, non-tender, non-distended MUSCULOSKELETAL:  No edema; No deformity  SKIN: Warm and dry NEUROLOGIC:  Alert and oriented x 3 PSYCHIATRIC:  Normal affect   ASSESSMENT:    1. Coronary artery disease of native artery of native heart with stable angina pectoris ([place])   2. Mixed hyperlipidemia   3. Pulmonary emphysema, unspecified emphysema type ([place])   4. Tobacco abuse   5. Essential hypertension   6. Stable angina pectoris ([facility])    PLAN:    In order of problems listed above:  #Multivessel CAD s/p PCI to LAD and OM: Last cath on 12/2020 for chest pain with patent LAD and OM stents, 60% D2, 30% mLAD, 60% RPDA which was overall stable from prior. Recommended for continued medical management. Overall, symptoms are much improved after the initiation of ranexa.  -Continue  ASA 81mg  daily -Continue lipitor 80mg  daily -Continue zetia 10mg  daily -Continue plavix 75mg  daily -Continue atenolol 50mg  daily -Continue imdur 90mg  daily -Continue ranexa 500mg  BID; can up-titrate as needed -Patient quit smoking -Continue diet and exercising efforts as detailed below  #DOE: #COPD/Emphysema: May be related to underlying COPD as cath with stable disease. Will manage CAD as above and refer to [facility] for COPD/emphysema.  -Manage CAD as above -Refer to Pulm  #HTN: Controlled at home and at goal <120/80s.  -Continue atenolol 50mg  daily -Continue imdur 90mg  daily -Continue amlodipine 5mg  daily  #HLD: LDL 68; goal <55.  -Continue lipitor 80mg  daily -Continue zetia 10mg  daily -Needs PCS9i; will refer to lipid clinic  #Tobacco Abuse: -Quit successfully  #Overweight: -Continue diet and lifestyle modifications as below  Exercise recommendations: Goal of exercising for at least 30 minutes a day, at least 5 times per week.  Please exercise to a moderate exertion.  This means that while exercising it is difficult to speak in full sentences, however you are not so short of breath that you feel you  must stop, and not so comfortable that you can carry on a full conversation.  Exertion level should be approximately a 5/10, if 10 is the most exertion you can perform.  Diet recommendations: Recommend a heart healthy diet such as the Mediterranean diet.  This diet consists of plant based foods, healthy fats, lean meats, olive oil.  It suggests limiting the intake of simple carbohydrates such as white breads, pastries, and pastas.  It also limits the amount of red meat, wine, and dairy products such as cheese that one should consume on a daily basis.       Medication Adjustments/Labs and Tests Ordered: Current medicines are reviewed at length with the patient today.  Concerns regarding medicines are outlined above.  Orders Placed This Encounter  Procedures   AMB Referral to  [facility]   Ambulatory referral to Pulmonology   Meds ordered this encounter  Medications   ezetimibe (ZETIA) 10 MG tablet    Sig: Take 1 tablet (10 mg total) by mouth daily.    Dispense:  90 tablet    Refill:  3    11/12/2020 NEW    Patient Instructions  Medication Instructions:  Your physician recommends that you continue on your current medications as directed. Please refer to the Current Medication list given to you today.    Follow-Up:May or June    You have been referred to [facility], they will call you to schedule appointment    Any Other Special Instructions Will Be Listed Below (If Applicable).  If you need a refill on your cardiac medications before your next appointment, please call your pharmacy.    Signed, Freada Bergeron, MD  02/18/2021 1:34 PM    [facility]

## 2021-02-18 ENCOUNTER — Encounter: Payer: Self-pay | Admitting: Cardiology

## 2021-02-18 ENCOUNTER — Ambulatory Visit (INDEPENDENT_AMBULATORY_CARE_PROVIDER_SITE_OTHER): Payer: Medicare Other | Admitting: Cardiology

## 2021-02-18 ENCOUNTER — Other Ambulatory Visit: Payer: Self-pay

## 2021-02-18 VITALS — BP 108/64 | HR 62 | Ht 66.0 in | Wt 180.0 lb

## 2021-02-18 DIAGNOSIS — I10 Essential (primary) hypertension: Secondary | ICD-10-CM

## 2021-02-18 DIAGNOSIS — Z720 Tobacco use: Secondary | ICD-10-CM | POA: Diagnosis not present

## 2021-02-18 DIAGNOSIS — J439 Emphysema, unspecified: Secondary | ICD-10-CM | POA: Diagnosis not present

## 2021-02-18 DIAGNOSIS — I25118 Atherosclerotic heart disease of native coronary artery with other forms of angina pectoris: Secondary | ICD-10-CM

## 2021-02-18 DIAGNOSIS — I208 Other forms of angina pectoris: Secondary | ICD-10-CM

## 2021-02-18 DIAGNOSIS — E782 Mixed hyperlipidemia: Secondary | ICD-10-CM | POA: Diagnosis not present

## 2021-02-18 MED ORDER — EZETIMIBE 10 MG PO TABS: 10.0000 mg | ORAL_TABLET | Freq: Every day | ORAL | 3 refills | Status: DC

## 2021-02-18 NOTE — Patient Instructions (Addendum)
Medication Instructions:  Your physician recommends that you continue on your current medications as directed. Please refer to the Current Medication list given to you today.    Follow-Up:May or June    You have been referred to [facility], they will call you to schedule appointment    Any Other Special Instructions Will Be Listed Below (If Applicable).  If you need a refill on your cardiac medications before your next appointment, please call your pharmacy.

## 2021-02-25 DIAGNOSIS — M25561 Pain in right knee: Secondary | ICD-10-CM | POA: Diagnosis not present

## 2021-02-25 DIAGNOSIS — G8929 Other chronic pain: Secondary | ICD-10-CM | POA: Diagnosis not present

## 2021-02-25 DIAGNOSIS — M1711 Unilateral primary osteoarthritis, right knee: Secondary | ICD-10-CM | POA: Diagnosis not present

## 2021-02-26 ENCOUNTER — Telehealth: Payer: Self-pay

## 2021-02-26 ENCOUNTER — Other Ambulatory Visit: Payer: Self-pay

## 2021-02-26 NOTE — Telephone Encounter (Signed)
I called phone number listed and I got Bill Johnson who is the brother of Bill Johnson.  Bill Johnson was not there to speak to, Bill Johnson said that Bill Johnson said he was not doing no wellness check he does not need it.  Bill Johnson said he spoke with someone a week or so ago and told them not to schedule the visit and somehow it was scheduled.  I let Bill Johnson know that I would pass this info on to the Dr and the Glass blower/designer to see what should be done from here.  Bill Johnson states Bill Johnson is hard of hearing and he tried to get him to do an in office appt and he refused that as well.

## 2021-03-06 ENCOUNTER — Telehealth: Payer: Self-pay | Admitting: Cardiology

## 2021-03-06 ENCOUNTER — Telehealth: Payer: Self-pay | Admitting: Internal Medicine

## 2021-03-06 ENCOUNTER — Other Ambulatory Visit: Payer: Self-pay | Admitting: *Deleted

## 2021-03-06 DIAGNOSIS — I10 Essential (primary) hypertension: Secondary | ICD-10-CM

## 2021-03-06 MED ORDER — ATENOLOL 50 MG PO TABS: 50.0000 mg | ORAL_TABLET | Freq: Every day | ORAL | 2 refills | Status: DC

## 2021-03-06 NOTE — Telephone Encounter (Signed)
I have called [facility], it appears they had referred the patient to [place] [facility] Dr. Judson Roch Desai's office.  They are asking [place] to contact Dr. Mauricio Po office and speak to his nurse regarding possible back injection in the future.  Callback pool, please reach out to Dr. Mauricio Po office to collect more information regarding upcoming procedure.  [phone] extension # [phone], Nurse Ms Caren Griffins

## 2021-03-06 NOTE — Telephone Encounter (Signed)
See notes from [place], [facility]. I have reached out to An[facility] and left message about pre op clearance. We will need to have a clearance request faxed to the office [phone] attn: pre op team. Need procedure to be done, anesthesia if any, any meds to be held and if MD has preference how long they want to hold blood thinner, fax #, date of procedure. Once I have received clearance request I will forward to pre op pool.

## 2021-03-06 NOTE — Telephone Encounter (Signed)
Pt needs refill on   atenolol (TENORMIN) 50 MG tablet

## 2021-03-06 NOTE — Telephone Encounter (Signed)
New Message:    Hillary from [place] would like for you to call her, concerning this patient.

## 2021-03-06 NOTE — Telephone Encounter (Signed)
Medication sent to pharmacy  

## 2021-03-07 NOTE — Telephone Encounter (Signed)
I have left message x 2 we will need clearance request to be faxed to our office, see previous notes.

## 2021-03-10 NOTE — Telephone Encounter (Signed)
° °  Pre-operative Risk Assessment    Patient Name: Bill Johnson  DOB: July 23, 1955 MRN: [id number]      Request for Surgical Clearance    Procedure:   CERVICAL SPINE INJECTION  Date of Surgery:  Clearance TBD                                 Surgeon:  DR. DAVE [facility] Surgeon's Group or Practice Name:  [facility] Phone number:  [phone] Fax number:  [phone]   Type of Clearance Requested:   - Medical  - Pharmacy:  Hold Clopidogrel (Plavix) x 7 DAYS PRIOR TO INJECTION   Type of Anesthesia:  Local    Additional requests/questions:    Jiles Prows   03/10/2021, 10:08 AM

## 2021-03-10 NOTE — Telephone Encounter (Signed)
Okay to hold plavix for 7 days.

## 2021-03-10 NOTE — Progress Notes (Signed)
Patient ID: Bill Johnson                 DOB: 1955-05-12                    MRN: [id number]     HPI: Bill Johnson is a 66 y.o. male patient referred to lipid clinic by Dr. Johney Frame. PMH is significant for HTN, GERD with esophagitis, COPD, OA, cervical spondylosis, anxiety, tobacco and alcohol abuse, CAD s/p DESx2 to o-mLAD 07/05/19 for CP and +CCTA, then DES to 95% OM1 stenosis 07/20/19 for residual symptoms with recent caths 04/24/20 and 01/02/21 for chest pain with stable disease, recommended continued medical management.   Today, patient arrives in good spirits. Is hard of hearing. Requests that all follow up calls go to his brother because the patient's phone isn't working right now.   Current Medications: atorvastatin 80 mg daily, ezetimibe 10 mg daily Intolerances: none Risk Factors: CAD s/p PCI with recurrent angina, HTN, former smoker, Fhx ASCVD LDL goal: <55 mg/dL  Diet: reports trying to improve his diet  Exercise: walks 3.5 miles per day  Family History: Diabetes in his mother; Heart disease in his father and mother; Hypertension in his father and mother; Stroke in an other family member  Social History: Former smoker  Labs: 12/23/20: TC 128, TG 122, HDL 38, LDL 68 08/09/20: TC 156, TG 142, HDL 57, LDL 75   Past Medical History:  Diagnosis Date   Alcohol abuse    Allergy    seasonal   Anxiety    CAD (coronary artery disease)    a. 06/2019 cath - s/p successful PCI of the ostial to mid LAD with IVUS guidance and DES x 2.   Depression    Essential hypertension    GERD (gastroesophageal reflux disease)    HOH (hard of hearing)    Hyperlipidemia    Hypertension    OA (osteoarthritis) of knee    Pulmonary nodules    a. seen on coronary CT 05/2019, will need OP f/u.   Rotator cuff tear arthropathy of both shoulders    On DISABILITY, non operative case   Shoulder pain    Tobacco abuse    Vertigo     Current Outpatient Medications on File Prior to Visit   Medication Sig Dispense Refill   acetaminophen (TYLENOL) 500 MG tablet Take 1,000 mg by mouth every 6 (six) hours as needed for mild pain or headache.     albuterol (VENTOLIN HFA) 108 (90 Base) MCG/ACT inhaler Inhale 2 puffs into the lungs every 6 (six) hours as needed for wheezing or shortness of breath. 1 each 2   amLODipine (NORVASC) 5 MG tablet Take 1 tablet (5 mg total) by mouth daily. 90 tablet 3   aspirin 81 MG tablet Take 81 mg by mouth daily.     atenolol (TENORMIN) 50 MG tablet Take 1 tablet (50 mg total) by mouth daily. 90 tablet 2   atorvastatin (LIPITOR) 80 MG tablet Take 1 tablet (80 mg total) by mouth every evening. 90 tablet 2   cetirizine (ZYRTEC) 10 MG tablet Take 10 mg by mouth daily.     Cholecalciferol (VITAMIN D3) 50 MCG (2000 UT) CAPS Take 2,000 Units by mouth daily.     clopidogrel (PLAVIX) 75 MG tablet Take 1 tablet (75 mg total) by mouth daily. 90 tablet 0   [facility]S VITAMIN B-12 2000 MCG TBCR Take 2,000 mcg by mouth daily.     diazepam (  VALIUM) 5 MG tablet Take 1 tablet (5 mg total) by mouth every 12 (twelve) hours as needed for anxiety. 45 tablet 2   ezetimibe (ZETIA) 10 MG tablet Take 1 tablet (10 mg total) by mouth daily. 90 tablet 3   isosorbide mononitrate (IMDUR) 60 MG 24 hr tablet Take 1.5 tablets (90 mg total) by mouth daily. 135 tablet 3   meclizine (ANTIVERT) 25 MG tablet TAKE 1 TABLET BY MOUTH THREE TIMES DAILY AS NEEDED FOR DIZZINESS 90 tablet 2   nitroGLYCERIN (NITROSTAT) 0.4 MG SL tablet Place 1 tablet (0.4 mg total) under the tongue every 5 (five) minutes as needed for chest pain (up to 3 doses). 25 tablet 3   pantoprazole (PROTONIX) 40 MG tablet Take 1 tablet (40 mg total) by mouth 2 (two) times daily. 180 tablet 2   ranolazine (RANEXA) 500 MG 12 hr tablet Take 1 tablet (500 mg total) by mouth 2 (two) times daily. 180 tablet 3   umeclidinium-vilanterol (ANORO ELLIPTA) 62.5-25 MCG/INH AEPB Inhale 1 puff into the lungs daily. 1 each 11   No current  facility-administered medications on file prior to visit.    No Known Allergies  Assessment/Plan:  1. Hyperlipidemia - LDL not at goal <55 mg/dL on high intensity statin and ezetimibe. Requires PCSK9i to lower LDL to goal and reduce cardiac risk. Submitted prior authorization for Repatha which was immediately approved. Called and spoke with the patient to let him know that this has been approved and sent to his pharmacy. He has dual complete so the cost should be $4. Will continue atorvastatin 80 mg daily and ezetimibe 10 mg daily. He is already scheduled for follow up fasting labs on 05/05/21. Keep up the great work with diet/exercise.   Rebbeca Paul, PharmD PGY2 Ambulatory Care Pharmacy Resident 03/11/2021 3:05 PM

## 2021-03-10 NOTE — Telephone Encounter (Signed)
Covering pre-op today. Will route to Dr. Johney Frame to assess whether patient may hold Plavix for 7 days prior cervical spine injection - please see very recent OV 02/18/21 for complete details of coronary history. Last PCI appears to have been in 02/2019, with recent cath 01/02/21:   Patent mid LAD stents without significant restenosis. Moderate ostial Diagonal branch stenosis, jailed by LAD stent with normal flow.  Patent obtuse marginal branch stent without restenosis. This vessel arises from what may be a large intermediate branch. 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 The proximal segment of the PDA has a 50-60% stenosis that is unchanged from prior cardiac caths.  Normal LV systolic function.  Mild elevation LV filling pressures, LVEDP 17 mmHg.    Recommendations: Continue medical management of CAD. I suspect that his dyspnea is secondary to deconditioning but cannot exclude microvascular disease.   Dr. Johney Frame, can patient hold Plavix for 7 days for c-spine injection? Please route response to P [facility] PREOP (the pre-op pool). Thank you.

## 2021-03-10 NOTE — Telephone Encounter (Signed)
Left message x 3 for Anasia with Dr. Judson Roch Desai's office that we need a clearance request to be faxed to our office, see notes. I am going to remove from the pre op call back pool at this time as we have not heard back from Dr. Mauricio Po office.

## 2021-03-11 ENCOUNTER — Ambulatory Visit (INDEPENDENT_AMBULATORY_CARE_PROVIDER_SITE_OTHER): Payer: Medicare Other | Admitting: Student-PharmD

## 2021-03-11 ENCOUNTER — Other Ambulatory Visit: Payer: Self-pay

## 2021-03-11 DIAGNOSIS — E782 Mixed hyperlipidemia: Secondary | ICD-10-CM

## 2021-03-11 MED ORDER — REPATHA SURECLICK 140 MG/ML ~~LOC~~ SOAJ: 140.0000 mg | SUBCUTANEOUS | 3 refills | Status: DC

## 2021-03-11 NOTE — Patient Instructions (Signed)
Nice to see you today!  Keep up the good work with diet and exercise. Aim for a diet full of vegetables, fruit and lean meats (chicken, [place], fish). Try to limit carbs (bread, pasta, sugar, rice) and red meat consumption.  Your goal LDL is less than 55 mg/dL, you're currently at 68 mg/dL  Medication Changes:  Continue atorvastatin 80 mg daily and ezetimibe 10 mg daily  I am submitting a prior authorization to your insurance for [place]. This is the injection we talked about today that is given once every 14 days.   I will call your brother when this is approved and sent to your pharmacy. You can start taking it once you pick it up.   You are scheduled on Monday, April 10 for lab work. Make sure you do not eat before this appointment.   Please give [place] a call at [phone] with any questions or concerns.  For Repatha, inject once every other week (any day of the week that works for you) into the fatty skin of stomach, upper outer thigh or back of the arm. Clean the site with soap and warm water or an alcohol pad. Keep the medication in the fridge until you are ready to give your dose, then take it out and let warm up to room temperature for 30-60 mins.

## 2021-03-11 NOTE — Telephone Encounter (Signed)
° ° °  Patient Name: Bill Johnson  DOB: [birth year] MRN: [id number]  Primary Cardiologist: Johney Frame  Chart reviewed as part of pre-operative protocol coverage. Patients  most recent office visit with Dr. Johney Frame was 02/18/21. Patient was contacted today and states that he is doing well with no new complaints of  chest pain, dyspnea, or SOB. He is able to complete 4 METS of activity at this time. [place] would be at acceptable risk for the planned procedure without further cardiovascular testing.   Patient advised that okay to hold plavix for 7 days prior to cervical spine injection. Please restart as soon as safely possible following injection procedure.   I will route this recommendation to the requesting party via Epic fax function and remove from pre-op pool.  Please call with questions.  Mable Fill, Marissa Nestle, NP 03/11/2021, 10:15 AM

## 2021-04-08 ENCOUNTER — Other Ambulatory Visit: Payer: Self-pay

## 2021-04-08 ENCOUNTER — Ambulatory Visit (INDEPENDENT_AMBULATORY_CARE_PROVIDER_SITE_OTHER): Payer: Medicare Other | Admitting: Internal Medicine

## 2021-04-08 ENCOUNTER — Encounter: Payer: Self-pay | Admitting: Internal Medicine

## 2021-04-08 DIAGNOSIS — J449 Chronic obstructive pulmonary disease, unspecified: Secondary | ICD-10-CM | POA: Diagnosis not present

## 2021-04-08 NOTE — Assessment & Plan Note (Signed)
Quit smoking 07/2020  ?- PFT's  2.66  FEV1 2.66 (94 % ) ratio 0.71  p 16 % improvement from saba p ? prior to study with DLCO  16.96 (69%)   FV curve  Minimal curvature  ?- 04/08/2021 try off anoro to see if symptoms worsen or saba use increases and if so resume  ? ?He does not actually meet criteria for copd based on pfts while smoking so doubt they are any worse now, though he could still have mild AB and benefit from prn saba or prn low dose symbicort Based on two studies from NEJM  378; 20 p 1865 (2018) and 380 : p2020-30 (2019) in pts with mild asthma it is reasonable to use low dose symbicort eg 80 2bid "prn" flare in this setting but I emphasized this was only shown with symbicort and takes advantage of the rapid onset of action but is not the same as "rescue therapy" but can be stopped once the acute symptoms have resolved and the need for rescue has been minimized (< 2 x weekly)   ? ? ? ? discussed ?  ?I reviewed the Fletcher curve with the patient that basically indicates  if you quit smoking when your best day FEV1 is still well preserved (as is clearly  the case here)  it is highly unlikely you will progress to severe disease and informed the patient there was  no medication on the market that has proven to alter the curve/ its downward trajectory  or the likelihood of progression of their disease(unlike other chronic medical conditions such as atheroclerosis where we do think we can change the natural hx with risk reducing meds)    Therefore stopping smoking and maintaining abstinence are  the most important aspects of care, not choice of inhalers or for that matter, doctors.  ? ?Treatment other than smoking cessation  is entirely directed by severity of symptoms and focused also on reducing exacerbations, not attempting to change the natural history of the disease.   ?    ? threrefore for now rec For now rec trial off anoro and f/u here prn  ? ?Each maintenance medication was reviewed in detail including  emphasizing most importantly the difference between maintenance and prns and under what circumstances the prns are to be triggered using an action plan format where appropriate. ? ?Total time for H and P, chart review, counseling, reviewing hfa and dpi (elipta)  device(s) and generating customized AVS unique to this office visit / same day charting = 46 min  ?     ?

## 2021-04-08 NOTE — Patient Instructions (Addendum)
Try off anoroo see if you get any worse shortness of breath and if so, resume  one click each am (like high octane fuel) ? ?Pulmonary follow up is as needed  ? ? ? ?

## 2021-04-08 NOTE — Progress Notes (Signed)
? ?Bill Johnson, male    DOB: 1955/12/07,    MRN: [id number] ? ? ?Brief patient profile:  ?36  yowm  quit smoking 07/2020  referred to pulmonary clinic in [facility]  04/08/2021 by fDr Bill Johnson for emphysema on CT   with GOLD 0 critieria in 2018  ? ? ? ? ?History of Present Illness  ?04/08/2021  Pulmonary/ 1st office eval/ Bill Johnson / [facility]  ?Chief Complaint  ?Patient presents with  ? Consult  ?  Ref by Dr. Johney Johnson for emphysema. Patient says he has this for "years" but doesn't have any issues breathing or with SOB   ?Dyspnea:  3.5 miles s stopping including some small hills / also limited by R knee > much better ex tol  ?Cough: none  ?Sleep: flat bed 2 pillows  ?SABA use:  occ with humidity  ? ?No obvious day to day or daytime variability or assoc excess/ purulent sputum or mucus plugs or hemoptysis or cp or chest tightness, subjective wheeze or overt sinus or hb symptoms.  ? ?sleeping without nocturnal  or early am exacerbation  of respiratory  c/o's or need for noct saba. Also denies any obvious fluctuation of symptoms with weather or environmental changes or other aggravating or alleviating factors except as outlined above  ? ?No unusual exposure hx or h/o childhood pna/ asthma or knowledge of premature birth. ? ?Current Allergies, Complete Past Medical History, Past Surgical History, Family History, and Social History were reviewed in [employer] record. ? ?ROS  The following are not active complaints unless bolded ?Hoarseness, sore throat, dysphagia, dental problems, itching, sneezing,  nasal congestion or discharge of excess mucus or purulent secretions, ear ache,   fever, chills, sweats, unintended wt loss or wt gain, classically pleuritic or exertional cp,  orthopnea pnd or arm/hand swelling  or leg swelling, presyncope, palpitations, abdominal pain, anorexia, nausea, vomiting, diarrhea  or change in bowel habits or change in bladder habits, change in stools or change in  urine, dysuria, hematuria,  rash, arthralgias, visual complaints, headache, numbness, weakness or ataxia or problems with walking or coordination,  change in mood or  memory. ?      ?   ? ?Past Medical History:  ?Diagnosis Date  ? Alcohol abuse   ? Allergy   ? seasonal  ? Anxiety   ? CAD (coronary artery disease)   ? a. 06/2019 cath - s/p successful PCI of the ostial to mid LAD with IVUS guidance and DES x 2.  ? Depression   ? Essential hypertension   ? GERD (gastroesophageal reflux disease)   ? HOH (hard of hearing)   ? Hyperlipidemia   ? Hypertension   ? OA (osteoarthritis) of knee   ? Pulmonary nodules   ? a. seen on coronary CT 05/2019, will need OP f/u.  ? Rotator cuff tear arthropathy of both shoulders   ? On DISABILITY, non operative case  ? Shoulder pain   ? Tobacco abuse   ? Vertigo   ? ? ?Outpatient Medications Prior to Visit  ?Medication Sig Dispense Refill  ? acetaminophen (TYLENOL) 500 MG tablet Take 1,000 mg by mouth every 6 (six) hours as needed for mild pain or headache.    ? albuterol (VENTOLIN HFA) 108 (90 Base) MCG/ACT inhaler Inhale 2 puffs into the lungs every 6 (six) hours as needed for wheezing or shortness of breath. 1 each 2  ? amLODipine (NORVASC) 5 MG tablet Take 1 tablet (5 mg total) by  mouth daily. 90 tablet 3  ? aspirin 81 MG tablet Take 81 mg by mouth daily.    ? atenolol (TENORMIN) 50 MG tablet Take 1 tablet (50 mg total) by mouth daily. 90 tablet 2  ? atorvastatin (LIPITOR) 80 MG tablet Take 1 tablet (80 mg total) by mouth every evening. 90 tablet 2  ? cetirizine (ZYRTEC) 10 MG tablet Take 10 mg by mouth daily.    ? Cholecalciferol (VITAMIN D3) 50 MCG (2000 UT) CAPS Take 2,000 Units by mouth daily.    ? clopidogrel (PLAVIX) 75 MG tablet Take 1 tablet (75 mg total) by mouth daily. 90 tablet 0  ? CVS VITAMIN B-12 2000 MCG TBCR Take 2,000 mcg by mouth daily.    ? diazepam (VALIUM) 5 MG tablet Take 1 tablet (5 mg total) by mouth every 12 (twelve) hours as needed for anxiety. 45 tablet 2  ?  Evolocumab (REPATHA SURECLICK) 264 MG/ML SOAJ Inject 140 mg into the skin every 14 (fourteen) days. 6 mL 3  ? ezetimibe (ZETIA) 10 MG tablet Take 1 tablet (10 mg total) by mouth daily. 90 tablet 3  ? meclizine (ANTIVERT) 25 MG tablet TAKE 1 TABLET BY MOUTH THREE TIMES DAILY AS NEEDED FOR DIZZINESS 90 tablet 2  ? nitroGLYCERIN (NITROSTAT) 0.4 MG SL tablet Place 1 tablet (0.4 mg total) under the tongue every 5 (five) minutes as needed for chest pain (up to 3 doses). 25 tablet 3  ? pantoprazole (PROTONIX) 40 MG tablet Take 1 tablet (40 mg total) by mouth 2 (two) times daily. 180 tablet 2  ? ranolazine (RANEXA) 500 MG 12 hr tablet Take 1 tablet (500 mg total) by mouth 2 (two) times daily. 180 tablet 3  ? umeclidinium-vilanterol (ANORO ELLIPTA) 62.5-25 MCG/INH AEPB Inhale 1 puff into the lungs daily. 1 each 11  ? isosorbide mononitrate (IMDUR) 60 MG 24 hr tablet Take 1.5 tablets (90 mg total) by mouth daily. 135 tablet 3  ? ?No facility-administered medications prior to visit.  ? ? ? ?Objective:  ?  ? ?BP 138/90 (BP Location: Left Arm, Patient Position: Sitting)   Pulse 62   Temp 97.9 ?F (36.6 ?C) (Temporal)   Ht '5\' 6"'$  (1.676 m)   Wt 172 lb 3.2 oz (78.1 kg)   SpO2 97% Comment: ra  BMI 27.79 kg/m?  ? ?SpO2: 97 % (ra)  ? ?Amb wm nad / extremely hard of nearing  ? ?HEENT : pt wearing mask not removed for exam due to covid - 19 concerns.  ? ?NECK :  without JVD/Nodes/TM/ nl carotid upstrokes bilaterally ? ? ?LUNGS: no acc muscle use,  Min barrel  contour chest wall with bilateral  slightly decreased bs s audible wheeze and  without cough on insp or exp maneuvers and min  Hyperresonant  to  percussion bilaterally   ? ? ?CV:  RRR  no s3 or murmur or increase in P2, and no edema  ? ?ABD:  soft and nontender with pos end  insp Hoover's  in the supine position. No bruits or organomegaly appreciated, bowel sounds nl ? ?MS:   Nl gait/  ext warm without deformities, calf tenderness, cyanosis or clubbing ?No obvious joint  restrictions  ? ?SKIN: warm and dry without lesions   ? ?NEURO:  alert, approp, nl sensorium with  no motor or cerebellar deficits apparent.  ?    ? ? ? ?I personally reviewed images and agree with radiology impression as follows:  ? Chest LDSCT   10/21/20 ?  Mild diffuse bronchial wall thickening with mild centrilobular ?and paraseptal emphysema; imaging findings suggestive of underlying ?COPD. ?   ?Assessment  ? ?COPD GOLD 0  ?Quit smoking 07/2020  ?- PFT's  2.66  FEV1 2.66 (94 % ) ratio 0.71  p 16 % improvement from saba p ? prior to study with DLCO  16.96 (69%)   FV curve  Minimal curvature  ?- 04/08/2021 try off anoro to see if symptoms worsen or saba use increases and if so resume  ? ?He does not actually meet criteria for copd based on pfts while smoking so doubt they are any worse now, though he could still have mild AB and benefit from prn saba or prn low dose symbicort Based on two studies from NEJM  378; 20 p 1865 (2018) and 380 : p2020-30 (2019) in pts with mild asthma it is reasonable to use low dose symbicort eg 80 2bid "prn" flare in this setting but I emphasized this was only shown with symbicort and takes advantage of the rapid onset of action but is not the same as "rescue therapy" but can be stopped once the acute symptoms have resolved and the need for rescue has been minimized (< 2 x weekly)   ? ? ? ? discussed ?  ?I reviewed the Fletcher curve with the patient that basically indicates  if you quit smoking when your best day FEV1 is still well preserved (as is clearly  the case here)  it is highly unlikely you will progress to severe disease and informed the patient there was  no medication on the market that has proven to alter the curve/ its downward trajectory  or the likelihood of progression of their disease(unlike other chronic medical conditions such as atheroclerosis where we do think we can change the natural hx with risk reducing meds)    Therefore stopping smoking and maintaining  abstinence are  the most important aspects of care, not choice of inhalers or for that matter, doctors.  ? ?Treatment other than smoking cessation  is entirely directed by severity of symptoms and focused also on reducing

## 2021-04-10 DIAGNOSIS — M47812 Spondylosis without myelopathy or radiculopathy, cervical region: Secondary | ICD-10-CM | POA: Diagnosis not present

## 2021-04-22 ENCOUNTER — Ambulatory Visit (INDEPENDENT_AMBULATORY_CARE_PROVIDER_SITE_OTHER): Payer: Medicare Other | Admitting: Internal Medicine

## 2021-04-22 ENCOUNTER — Other Ambulatory Visit: Payer: Self-pay

## 2021-04-22 ENCOUNTER — Ambulatory Visit: Payer: Medicare Other | Admitting: Internal Medicine

## 2021-04-22 ENCOUNTER — Encounter: Payer: Self-pay | Admitting: Internal Medicine

## 2021-04-22 VITALS — BP 107/71 | HR 62 | Ht 66.0 in | Wt 172.2 lb

## 2021-04-22 DIAGNOSIS — I25118 Atherosclerotic heart disease of native coronary artery with other forms of angina pectoris: Secondary | ICD-10-CM

## 2021-04-22 DIAGNOSIS — I10 Essential (primary) hypertension: Secondary | ICD-10-CM

## 2021-04-22 DIAGNOSIS — F411 Generalized anxiety disorder: Secondary | ICD-10-CM

## 2021-04-22 DIAGNOSIS — M1711 Unilateral primary osteoarthritis, right knee: Secondary | ICD-10-CM | POA: Diagnosis not present

## 2021-04-22 DIAGNOSIS — M2391 Unspecified internal derangement of right knee: Secondary | ICD-10-CM | POA: Diagnosis not present

## 2021-04-22 DIAGNOSIS — I2510 Atherosclerotic heart disease of native coronary artery without angina pectoris: Secondary | ICD-10-CM

## 2021-04-22 DIAGNOSIS — J449 Chronic obstructive pulmonary disease, unspecified: Secondary | ICD-10-CM | POA: Diagnosis not present

## 2021-04-22 DIAGNOSIS — G8929 Other chronic pain: Secondary | ICD-10-CM | POA: Diagnosis not present

## 2021-04-22 DIAGNOSIS — M25561 Pain in right knee: Secondary | ICD-10-CM | POA: Diagnosis not present

## 2021-04-22 MED ORDER — CLOPIDOGREL BISULFATE 75 MG PO TABS: 75.0000 mg | ORAL_TABLET | Freq: Every day | ORAL | 3 refills | Status: DC

## 2021-04-22 MED ORDER — DIAZEPAM 5 MG PO TABS: 5.0000 mg | ORAL_TABLET | Freq: Two times a day (BID) | ORAL | 2 refills | Status: AC | PRN

## 2021-04-22 NOTE — Assessment & Plan Note (Signed)
S/p stent placement in 06/2019 ?On DAPT and statin ?On Imdur and PRN Nitroglycerin for angina ?Follows up with Cardiologist ?

## 2021-04-22 NOTE — Assessment & Plan Note (Signed)
Well controlled with albuterol as needed alone ?Followed by pulmonology ?

## 2021-04-22 NOTE — Assessment & Plan Note (Signed)
BP Readings from Last 1 Encounters:  ?04/22/21 107/71  ? ?Well-controlled with Amlodipine, Atenolol and Imdur ?Counseled for compliance with the medications ?Advised DASH diet and moderate exercise/walking, at least 150 mins/week ?

## 2021-04-22 NOTE — Assessment & Plan Note (Signed)
Well-controlled with Valium, takes 5 mg q12h PRN - although takes it QD most of the days. ?Refilled Valium ?

## 2021-04-22 NOTE — Patient Instructions (Addendum)
Please ask your Cardiologist if you need to take Aspirin and Plavix both. ? ?Please continue taking medications as prescribed. ? ?Please continue to follow low salt diet and ambulate as tolerated. ?

## 2021-04-22 NOTE — Progress Notes (Signed)
? ?Established Patient Office Visit ? ?Subjective:  ?Patient ID: Bill Johnson, male    DOB: 04/05/1955  Age: 66 y.o. MRN: [id number] ? ?CC:  ?Chief Complaint  ?Patient presents with  ? Follow-up  ?  Follow up,seeing ortho today for knee  ? ? ?HPI ?Bill Johnson is a 66 y.o. male with past medical history of CAD s/p stent placement, hypertension, COPD, GERD, OA of shoulder, cervical spondylosis, anxiety and chronic knee pain who presents for f/u of his chronic medical conditions. ? ?CAD: On DAPT and statin. Followed by Cardiology.  He has had cardiac cath in 12/22, which showed patent LAD and OM stents, moderate D1 disease, moderate PDA disease which was overall stable. Recommended for continued medical management.  He currently denies any chest pain or dyspnea. ? ?HTN: BP is well-controlled. Takes medications regularly. Patient denies headache, dizziness, chest pain, dyspnea or palpitations. ? ?COPD: He has been using only albuterol as needed now.  His Anoro was discontinued by Dr. [name] recently.  Denies any dyspnea or wheezing currently. ? ? ? ? ?Past Medical History:  ?Diagnosis Date  ? Alcohol abuse   ? Allergy   ? seasonal  ? Anxiety   ? CAD (coronary artery disease)   ? a. 06/2019 cath - s/p successful PCI of the ostial to mid LAD with IVUS guidance and DES x 2.  ? Depression   ? Essential hypertension   ? GERD (gastroesophageal reflux disease)   ? HOH (hard of hearing)   ? Hyperlipidemia   ? Hypertension   ? OA (osteoarthritis) of knee   ? Pulmonary nodules   ? a. seen on coronary CT 05/2019, will need OP f/u.  ? Rotator cuff tear arthropathy of both shoulders   ? On DISABILITY, non operative case  ? Shoulder pain   ? Tobacco abuse   ? Vertigo   ? ? ?Past Surgical History:  ?Procedure Laterality Date  ? CARDIAC CATHETERIZATION  06/2005  ? no significant CAD  ? COLONOSCOPY WITH PROPOFOL N/A 05/01/2019  ? Procedure: COLONOSCOPY WITH PROPOFOL;  Surgeon: Daneil Dolin, MD;  [facility];  Service:  Endoscopy;  Laterality: N/A;  7:30am  ? COLONOSCOPY WITH PROPOFOL N/A 03/07/2020  ? Procedure: COLONOSCOPY WITH PROPOFOL;  Surgeon: Daneil Dolin, MD;  [facility]: [facility];  Service: Endoscopy;  Laterality: N/A;  8:30am  ? CORONARY STENT INTERVENTION N/A 07/05/2019  ? Procedure: CORONARY STENT INTERVENTION;  Surgeon: Martinique, Peter M, MD;  Location: [place] [facility];  Service: Cardiovascular;  Laterality: N/A;  ? CORONARY STENT INTERVENTION N/A 07/20/2019  ? Procedure: CORONARY STENT INTERVENTION;  Surgeon: Jettie Booze, MD;  Location: [place];  Service: Cardiovascular;  Laterality: N/A;  ? ESOPHAGOGASTRODUODENOSCOPY  08/2005  ? Dr. Henrene Pastor: small hiatal hernia  ? HERNIA REPAIR  1983  ? INTRAVASCULAR PRESSURE WIRE/FFR STUDY N/A 07/05/2019  ? Procedure: INTRAVASCULAR PRESSURE WIRE/FFR STUDY;  Surgeon: Martinique, Peter M, MD;  Location: [facility];  Service: Cardiovascular;  Laterality: N/A;  ? INTRAVASCULAR ULTRASOUND/IVUS N/A 07/05/2019  ? Procedure: Intravascular Ultrasound/IVUS;  Surgeon: Martinique, Peter M, MD;  Location: [place] [facility];  Service: Cardiovascular;  Laterality: N/A;  ? LEFT HEART CATH AND CORONARY ANGIOGRAPHY N/A 07/05/2019  ? Procedure: LEFT HEART CATH AND CORONARY ANGIOGRAPHY;  Surgeon: Martinique, Peter M, MD;  Location: [facility];  Service: Cardiovascular;  Laterality: N/A;  ? LEFT HEART CATH AND CORONARY ANGIOGRAPHY N/A 07/20/2019  ? Procedure:  LEFT HEART CATH AND CORONARY ANGIOGRAPHY;  Surgeon: Jettie Booze, MD;  Location: [place] [facility];  Service: Cardiovascular;  Laterality: N/A;  ? LEFT HEART CATH AND CORONARY ANGIOGRAPHY N/A 04/24/2020  ? Procedure: LEFT HEART CATH AND CORONARY ANGIOGRAPHY;  Surgeon: Belva Crome, MD;  Location: [facility];  Service: Cardiovascular;  Laterality: N/A;  ? LEFT HEART CATH AND CORONARY ANGIOGRAPHY N/A 01/02/2021  ? Procedure: LEFT HEART CATH AND CORONARY ANGIOGRAPHY;  Surgeon: Burnell Blanks, MD;  Location:  [facility];  Service: Cardiovascular;  Laterality: N/A;  ? POLYPECTOMY  05/01/2019  ? Procedure: POLYPECTOMY;  Surgeon: Daneil Dolin, MD;  [facility];  Service: Endoscopy;;  ? POLYPECTOMY  03/07/2020  ? Procedure: POLYPECTOMY;  Surgeon: Daneil Dolin, MD;  [facility];  Service: Endoscopy;;  ? ROTATOR CUFF REPAIR Bilateral   ? TONSILLECTOMY  1977  ? ? ?Family History  ?Problem Relation Age of Onset  ? Heart disease Mother   ? Diabetes Mother   ? Hypertension Mother   ? Heart disease Father   ? Hypertension Father   ? Stroke Other   ? Cancer Other   ? Cancer Brother   ?     prostate  ? Colon cancer Neg Hx   ? ? ?Social History  ? ?Socioeconomic History  ? Marital status: Divorced  ?  Spouse name: Not on file  ? Number of children: 2  ? Years of education: 46  ? Highest education level: Not on file  ?Occupational History  ? Occupation: Film/video editor Designer, television/film set)  ?Tobacco Use  ? Smoking status: Former  ?  Packs/day: 1.00  ?  Years: 43.00  ?  Pack years: 43.00  ?  Types: Cigarettes  ?  Start date: 04/15/1970  ?  Quit date: 07/26/2020  ?  Years since quitting: 0.7  ? Smokeless tobacco: Never  ?Vaping Use  ? Vaping Use: Never used  ?Substance and Sexual Activity  ? Alcohol use: Not Currently  ?  Comment: Drinks 3-4 beers/day   ? Drug use: No  ? Sexual activity: Yes  ?Other Topics Concern  ? Not on file  ?Social History Narrative  ? Lives at home with nephew and niece in-law  ? Caffeine use: Drinks coffee/soda every day  ? ?Social Determinants of Health  ? ?Financial Resource Strain: Not on file  ?Food Insecurity: Not on file  ?Transportation Needs: Not on file  ?Physical Activity: Not on file  ?Stress: Not on file  ?Social Connections: Not on file  ?Intimate Partner Violence: Not on file  ? ? ?Outpatient Medications Prior to Visit  ?Medication Sig Dispense Refill  ? acetaminophen (TYLENOL) 500 MG tablet Take 1,000 mg by mouth every 6 (six) hours as needed for mild pain or headache.    ?  albuterol (VENTOLIN HFA) 108 (90 Base) MCG/ACT inhaler Inhale 2 puffs into the lungs every 6 (six) hours as needed for wheezing or shortness of breath. 1 each 2  ? amLODipine (NORVASC) 5 MG tablet Take 1 tablet (5 mg total) by mouth daily. 90 tablet 3  ? aspirin 81 MG tablet Take 81 mg by mouth daily.    ? atenolol (TENORMIN) 50 MG tablet Take 1 tablet (50 mg total) by mouth daily. 90 tablet 2  ? atorvastatin (LIPITOR) 80 MG tablet Take 1 tablet (80 mg total) by mouth every evening. 90 tablet 2  ? cetirizine (ZYRTEC) 10 MG tablet Take 10 mg by mouth  daily.    ? Cholecalciferol (VITAMIN D3) 50 MCG (2000 UT) CAPS Take 2,000 Units by mouth daily.    ? CVS VITAMIN B-12 2000 MCG TBCR Take 2,000 mcg by mouth daily.    ? Evolocumab (REPATHA SURECLICK) 323 MG/ML SOAJ Inject 140 mg into the skin every 14 (fourteen) days. 6 mL 3  ? ezetimibe (ZETIA) 10 MG tablet Take 1 tablet (10 mg total) by mouth daily. 90 tablet 3  ? meclizine (ANTIVERT) 25 MG tablet TAKE 1 TABLET BY MOUTH THREE TIMES DAILY AS NEEDED FOR DIZZINESS 90 tablet 2  ? nitroGLYCERIN (NITROSTAT) 0.4 MG SL tablet Place 1 tablet (0.4 mg total) under the tongue every 5 (five) minutes as needed for chest pain (up to 3 doses). 25 tablet 3  ? pantoprazole (PROTONIX) 40 MG tablet Take 1 tablet (40 mg total) by mouth 2 (two) times daily. 180 tablet 2  ? ranolazine (RANEXA) 500 MG 12 hr tablet Take 1 tablet (500 mg total) by mouth 2 (two) times daily. 180 tablet 3  ? clopidogrel (PLAVIX) 75 MG tablet Take 1 tablet (75 mg total) by mouth daily. 90 tablet 0  ? diazepam (VALIUM) 5 MG tablet Take 1 tablet (5 mg total) by mouth every 12 (twelve) hours as needed for anxiety. 45 tablet 2  ? isosorbide mononitrate (IMDUR) 60 MG 24 hr tablet Take 1.5 tablets (90 mg total) by mouth daily. 135 tablet 3  ? ?No facility-administered medications prior to visit.  ? ? ?No Known Allergies ? ?ROS ?Review of Systems  ?Constitutional:  Negative for chills and fever.  ?HENT:  Negative for  congestion and sore throat.   ?Eyes:  Negative for pain and discharge.  ?Respiratory:  Negative for cough and shortness of breath.   ?Cardiovascular:  Negative for chest pain and palpitations.  ?Gastrointest

## 2021-04-23 ENCOUNTER — Telehealth: Payer: Self-pay | Admitting: Cardiology

## 2021-04-23 NOTE — Telephone Encounter (Signed)
I would continue both aspirin and plavix for him if he is tolerating it. He has such extensive disease and has required multiple interventions so I like to keep him as protected as possible. If needed, he could drop the aspirin.  ?

## 2021-04-23 NOTE — Telephone Encounter (Signed)
Returned call to pt. Spoke with brother Liliane Channel who states that pt has switched PCP's and would like our office to take over all cardiac meds.  ?

## 2021-04-23 NOTE — Telephone Encounter (Signed)
I will forward to cardiologist for review ?

## 2021-04-23 NOTE — Telephone Encounter (Signed)
Patient went to his [facility] yesterday and said he was asked if he needed to be on both aspirin and Plavix he would like a nurse to call him.. Please advise.Please call him on [phone] ?

## 2021-04-25 DIAGNOSIS — M1711 Unilateral primary osteoarthritis, right knee: Secondary | ICD-10-CM | POA: Diagnosis not present

## 2021-04-25 DIAGNOSIS — M238X1 Other internal derangements of right knee: Secondary | ICD-10-CM | POA: Diagnosis not present

## 2021-04-25 DIAGNOSIS — S76111A Strain of right quadriceps muscle, fascia and tendon, initial encounter: Secondary | ICD-10-CM | POA: Diagnosis not present

## 2021-04-25 DIAGNOSIS — S83231A Complex tear of medial meniscus, current injury, right knee, initial encounter: Secondary | ICD-10-CM | POA: Diagnosis not present

## 2021-04-25 DIAGNOSIS — M23351 Other meniscus derangements, posterior horn of lateral meniscus, right knee: Secondary | ICD-10-CM | POA: Diagnosis not present

## 2021-04-28 DIAGNOSIS — M25512 Pain in left shoulder: Secondary | ICD-10-CM | POA: Diagnosis not present

## 2021-04-28 DIAGNOSIS — M25612 Stiffness of left shoulder, not elsewhere classified: Secondary | ICD-10-CM | POA: Diagnosis not present

## 2021-04-28 DIAGNOSIS — S4992XA Unspecified injury of left shoulder and upper arm, initial encounter: Secondary | ICD-10-CM | POA: Diagnosis not present

## 2021-05-01 DIAGNOSIS — S4992XA Unspecified injury of left shoulder and upper arm, initial encounter: Secondary | ICD-10-CM | POA: Diagnosis not present

## 2021-05-01 DIAGNOSIS — M25612 Stiffness of left shoulder, not elsewhere classified: Secondary | ICD-10-CM | POA: Diagnosis not present

## 2021-05-01 DIAGNOSIS — M25512 Pain in left shoulder: Secondary | ICD-10-CM | POA: Diagnosis not present

## 2021-05-01 DIAGNOSIS — M1711 Unilateral primary osteoarthritis, right knee: Secondary | ICD-10-CM | POA: Diagnosis not present

## 2021-05-01 DIAGNOSIS — G8929 Other chronic pain: Secondary | ICD-10-CM | POA: Diagnosis not present

## 2021-05-01 DIAGNOSIS — M2391 Unspecified internal derangement of right knee: Secondary | ICD-10-CM | POA: Diagnosis not present

## 2021-05-01 DIAGNOSIS — M25561 Pain in right knee: Secondary | ICD-10-CM | POA: Diagnosis not present

## 2021-05-05 ENCOUNTER — Other Ambulatory Visit: Payer: Medicare Other | Admitting: *Deleted

## 2021-05-05 DIAGNOSIS — E782 Mixed hyperlipidemia: Secondary | ICD-10-CM

## 2021-05-05 LAB — LIPID PANEL
Chol/HDL Ratio: 1.4 ratio (ref 0.0–5.0)
Cholesterol, Total: 59 mg/dL — ABNORMAL LOW (ref 100–199)
HDL: 43 mg/dL (ref >39)
LDL Chol Calc (NIH): 2 mg/dL (ref 0–99)
Triglycerides: 51 mg/dL (ref 0–149)
VLDL Cholesterol Cal: 14 mg/dL (ref 5–40)

## 2021-05-05 LAB — HEPATIC FUNCTION PANEL
ALT: 15 IU/L (ref 0–44)
AST: 14 IU/L (ref 0–40)
Albumin: 4.3 g/dL (ref 3.8–4.8)
Alkaline Phosphatase: 62 IU/L (ref 44–121)
Bilirubin Total: 0.5 mg/dL (ref 0.0–1.2)
Bilirubin, Direct: 0.23 mg/dL (ref 0.00–0.40)
Total Protein: 6.7 g/dL (ref 6.0–8.5)

## 2021-05-12 DIAGNOSIS — M75122 Complete rotator cuff tear or rupture of left shoulder, not specified as traumatic: Secondary | ICD-10-CM | POA: Diagnosis not present

## 2021-05-12 DIAGNOSIS — M25412 Effusion, left shoulder: Secondary | ICD-10-CM | POA: Diagnosis not present

## 2021-05-12 DIAGNOSIS — M19012 Primary osteoarthritis, left shoulder: Secondary | ICD-10-CM | POA: Diagnosis not present

## 2021-05-12 DIAGNOSIS — S43432A Superior glenoid labrum lesion of left shoulder, initial encounter: Secondary | ICD-10-CM | POA: Diagnosis not present

## 2021-05-19 DIAGNOSIS — S4992XA Unspecified injury of left shoulder and upper arm, initial encounter: Secondary | ICD-10-CM | POA: Diagnosis not present

## 2021-05-19 DIAGNOSIS — M12811 Other specific arthropathies, not elsewhere classified, right shoulder: Secondary | ICD-10-CM | POA: Diagnosis not present

## 2021-05-19 DIAGNOSIS — M12812 Other specific arthropathies, not elsewhere classified, left shoulder: Secondary | ICD-10-CM | POA: Diagnosis not present

## 2021-05-19 DIAGNOSIS — M25512 Pain in left shoulder: Secondary | ICD-10-CM | POA: Diagnosis not present

## 2021-05-19 DIAGNOSIS — M47812 Spondylosis without myelopathy or radiculopathy, cervical region: Secondary | ICD-10-CM | POA: Diagnosis not present

## 2021-05-27 DIAGNOSIS — I10 Essential (primary) hypertension: Secondary | ICD-10-CM | POA: Diagnosis not present

## 2021-05-27 DIAGNOSIS — G5603 Carpal tunnel syndrome, bilateral upper limbs: Secondary | ICD-10-CM | POA: Diagnosis not present

## 2021-05-27 DIAGNOSIS — Z955 Presence of coronary angioplasty implant and graft: Secondary | ICD-10-CM | POA: Diagnosis not present

## 2021-05-27 DIAGNOSIS — J449 Chronic obstructive pulmonary disease, unspecified: Secondary | ICD-10-CM | POA: Diagnosis not present

## 2021-05-27 DIAGNOSIS — M25561 Pain in right knee: Secondary | ICD-10-CM | POA: Diagnosis not present

## 2021-05-27 DIAGNOSIS — G8929 Other chronic pain: Secondary | ICD-10-CM | POA: Diagnosis not present

## 2021-05-27 DIAGNOSIS — Z1159 Encounter for screening for other viral diseases: Secondary | ICD-10-CM | POA: Diagnosis not present

## 2021-05-27 DIAGNOSIS — E782 Mixed hyperlipidemia: Secondary | ICD-10-CM | POA: Diagnosis not present

## 2021-05-27 DIAGNOSIS — I700 Atherosclerosis of aorta: Secondary | ICD-10-CM | POA: Diagnosis not present

## 2021-05-27 DIAGNOSIS — M1990 Unspecified osteoarthritis, unspecified site: Secondary | ICD-10-CM | POA: Diagnosis not present

## 2021-05-27 DIAGNOSIS — M542 Cervicalgia: Secondary | ICD-10-CM | POA: Diagnosis not present

## 2021-06-09 NOTE — Progress Notes (Unsigned)
Cardiology Office Note:    Date:  06/19/2021   ID:  Bill Johnson, DOB May 14, 1955, MRN [id number]  PCP:  Garnet Sierras, NP   [facility] Providers Cardiologist:  None {   Referring MD: Lindell Spar, MD    History of Present Illness:    Bill Johnson is a 66 y.o. male with a hx of HTN, GERD with esophagitis, COPD, OA, cervical spondylosis, anxiety, tobacco and alcohol abuse, CAD s/p DESx2 to o-mLAD 07/05/19 for CP and +CCTA, then DES to 95% OM1 stenosis 07/20/19 for residual symptoms with recent cath for chest pain with stable disease who now presents to clinic for follow-up.  Patient admitted from 04/23/20-04/25/20 for chest pain. Cath done on that admission showed widely patent previously placed stents in mid-LAD (mild ISR), prox-to-mid LAD, and OM1 with no new lesions. He was then discharged on medical management. Of note, during previous admission for chest pain it was thought that his jailed diagonal may be the source of his discomfort.  Saw me in clinic on 12/30/20 as urgent visit where he was having significant chest pain with minimal exertion and decreased exercise capacity. We referred for repeat cath on 01/02/2021 patent LAD and OM stents, moderate D1 disease, moderate PDA disease which was overall stable. Recommended for continued medical management.  Was last seen in clinic on  01/2021 where he was doing well. Was still having SOB so we referred to [facility] who recommended prn symbicort.  Today, the patient overall feels so much better. No chest pain. Ranexa has been working well. Continues to have some dyspnea on exertion but he is able to mow the lawn and take breaks throughout. He also has lost 20lbs by walking up to 3 miles per day. No LE edema, orthopnea or PND. Has been dealing with orthopedic issues with his knees and shoulders.    Past Medical History:  Diagnosis Date   Alcohol abuse    Allergy    seasonal   Anxiety    CAD (coronary artery disease)    a.  06/2019 cath - s/p successful PCI of the ostial to mid LAD with IVUS guidance and DES x 2.   Depression    Essential hypertension    GERD (gastroesophageal reflux disease)    HOH (hard of hearing)    Hyperlipidemia    Hypertension    OA (osteoarthritis) of knee    Pulmonary nodules    a. seen on coronary CT 05/2019, will need OP f/u.   Rotator cuff tear arthropathy of both shoulders    On DISABILITY, non operative case   Shoulder pain    Tobacco abuse    Vertigo     Past Surgical History:  Procedure Laterality Date   CARDIAC CATHETERIZATION  06/2005   no significant CAD   COLONOSCOPY WITH PROPOFOL N/A 05/01/2019   Procedure: COLONOSCOPY WITH PROPOFOL;  Surgeon: Daneil Dolin, MD;  [facility];  Service: Endoscopy;  Laterality: N/A;  7:30am   COLONOSCOPY WITH PROPOFOL N/A 03/07/2020   Procedure: COLONOSCOPY WITH PROPOFOL;  Surgeon: Daneil Dolin, MD;  [facility];  Service: Endoscopy;  Laterality: N/A;  8:30am   CORONARY STENT INTERVENTION N/A 07/05/2019   Procedure: CORONARY STENT INTERVENTION;  Surgeon: Martinique, Peter M, MD;  Location: [place] [facility];  Service: Cardiovascular;  Laterality: N/A;   CORONARY STENT INTERVENTION N/A 07/20/2019   Procedure: CORONARY STENT INTERVENTION;  Surgeon: Jettie Booze, MD;  Location: [place] [facility];  Service: Cardiovascular;  Laterality: N/A;   ESOPHAGOGASTRODUODENOSCOPY  08/2005   Dr. Henrene Pastor: small hiatal hernia   HERNIA REPAIR  1983   INTRAVASCULAR PRESSURE WIRE/FFR STUDY N/A 07/05/2019   Procedure: INTRAVASCULAR PRESSURE WIRE/FFR STUDY;  Surgeon: Martinique, Peter M, MD;  Location: [place] [facility];  Service: Cardiovascular;  Laterality: N/A;   INTRAVASCULAR ULTRASOUND/IVUS N/A 07/05/2019   Procedure: Intravascular Ultrasound/IVUS;  Surgeon: Martinique, Peter M, MD;  Location: [place] [facility];  Service: Cardiovascular;  Laterality: N/A;   LEFT HEART CATH AND CORONARY ANGIOGRAPHY N/A 07/05/2019   Procedure: LEFT HEART  CATH AND CORONARY ANGIOGRAPHY;  Surgeon: Martinique, Peter M, MD;  Location: [place] [facility];  Service: Cardiovascular;  Laterality: N/A;   LEFT HEART CATH AND CORONARY ANGIOGRAPHY N/A 07/20/2019   Procedure: LEFT HEART CATH AND CORONARY ANGIOGRAPHY;  Surgeon: Jettie Booze, MD;  Location: [place] [facility];  Service: Cardiovascular;  Laterality: N/A;   LEFT HEART CATH AND CORONARY ANGIOGRAPHY N/A 04/24/2020   Procedure: LEFT HEART CATH AND CORONARY ANGIOGRAPHY;  Surgeon: Belva Crome, MD;  Location: [place] [facility];  Service: Cardiovascular;  Laterality: N/A;   LEFT HEART CATH AND CORONARY ANGIOGRAPHY N/A 01/02/2021   Procedure: LEFT HEART CATH AND CORONARY ANGIOGRAPHY;  Surgeon: Burnell Blanks, MD;  Location: [place] [facility];  Service: Cardiovascular;  Laterality: N/A;   POLYPECTOMY  05/01/2019   Procedure: POLYPECTOMY;  Surgeon: Daneil Dolin, MD;  [facility];  Service: Endoscopy;;   POLYPECTOMY  03/07/2020   Procedure: POLYPECTOMY;  Surgeon: Daneil Dolin, MD;  [facility];  Service: Endoscopy;;   ROTATOR CUFF REPAIR Bilateral    TONSILLECTOMY  1977    Current Medications: Current Meds  Medication Sig   acetaminophen (TYLENOL) 500 MG tablet Take 1,000 mg by mouth every 6 (six) hours as needed for mild pain or headache.   albuterol (VENTOLIN HFA) 108 (90 Base) MCG/ACT inhaler Inhale 2 puffs into the lungs every 6 (six) hours as needed for wheezing or shortness of breath.   amLODipine (NORVASC) 5 MG tablet Take 1 tablet (5 mg total) by mouth daily.   aspirin 81 MG tablet Take 81 mg by mouth daily.   atenolol (TENORMIN) 50 MG tablet Take 1 tablet (50 mg total) by mouth daily.   atorvastatin (LIPITOR) 80 MG tablet Take 1 tablet (80 mg total) by mouth every evening.   cetirizine (ZYRTEC) 10 MG tablet Take 10 mg by mouth daily.   Cholecalciferol (VITAMIN D3) 50 MCG (2000 UT) CAPS Take 2,000 Units by mouth daily.   clopidogrel (PLAVIX) 75 MG  tablet Take 1 tablet (75 mg total) by mouth daily.   CVS VITAMIN B-12 2000 MCG TBCR Take 2,000 mcg by mouth daily.   diazepam (VALIUM) 5 MG tablet Take 1 tablet (5 mg total) by mouth every 12 (twelve) hours as needed for anxiety.   Evolocumab (REPATHA SURECLICK) 275 MG/ML SOAJ Inject 140 mg into the skin every 14 (fourteen) days.   ezetimibe (ZETIA) 10 MG tablet Take 1 tablet (10 mg total) by mouth daily.   meclizine (ANTIVERT) 25 MG tablet TAKE 1 TABLET BY MOUTH THREE TIMES DAILY AS NEEDED FOR DIZZINESS   nitroGLYCERIN (NITROSTAT) 0.4 MG SL tablet Place 1 tablet (0.4 mg total) under the tongue every 5 (five) minutes as needed for chest pain (up to 3 doses).   pantoprazole (PROTONIX) 40 MG tablet Take 1 tablet (40 mg total) by mouth 2 (two) times daily.  ranolazine (RANEXA) 500 MG 12 hr tablet Take 1 tablet (500 mg total) by mouth 2 (two) times daily.     Allergies:   Patient has no known allergies.   Social History   Socioeconomic History   Marital status: Divorced    Spouse name: Not on file   Number of children: 2   Years of education: 12   Highest education level: Not on file  Occupational History   Occupation: Self-employed Designer, television/film set)  Tobacco Use   Smoking status: Former    Packs/day: 1.00    Years: 43.00    Pack years: 43.00    Types: Cigarettes    Start date: 04/15/1970    Quit date: 07/26/2020    Years since quitting: 0.8   Smokeless tobacco: Never  Vaping Use   Vaping Use: Never used  Substance and Sexual Activity   Alcohol use: Not Currently    Comment: Drinks 3-4 beers/day    Drug use: No   Sexual activity: Yes  Other Topics Concern   Not on file  Social History Narrative   Lives at home with nephew and niece in-law   Caffeine use: Drinks coffee/soda every day   Social Determinants of Radio broadcast assistant Strain: Not on file  Food Insecurity: Not on file  Transportation Needs: Not on file  Physical Activity: Not on file  Stress: Not on file   Social Connections: Not on file     Family History: The patient's family history includes Cancer in his brother and another family member; Diabetes in his mother; Heart disease in his father and mother; Hypertension in his father and mother; Stroke in an other family member. There is no history of Colon cancer.  ROS:   Please see the history of present illness.    Review of Systems  Constitutional:  Negative for malaise/fatigue.  Respiratory:  Positive for shortness of breath.   Cardiovascular:  Negative for chest pain, palpitations, orthopnea, claudication, leg swelling and PND.  Gastrointestinal:  Negative for blood in stool and melena.  Genitourinary:  Negative for hematuria.  Musculoskeletal:  Positive for back pain and joint pain.  Neurological:  Negative for dizziness and loss of consciousness.    EKGs/Labs/Other Studies Reviewed:    The following studies were reviewed today: 01/02/21:   2nd Diag lesion is 60% stenosed.   RPDA lesion is 60% stenosed.   RV Branch lesion is 10% stenosed.   1st Mrg lesion is 10% stenosed.   Mid LAD lesion is 30% stenosed.   The left ventricular systolic function is normal.   LV end diastolic pressure is normal.   The left ventricular ejection fraction is 55-65% by visual estimate.   There is no mitral valve regurgitation.   Patent mid LAD stents without significant restenosis. Moderate ostial Diagonal branch stenosis, jailed by LAD stent with normal flow.  Patent obtuse marginal branch stent without restenosis. This vessel arises from what may be a large intermediate branch. 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 The proximal segment of the PDA has a 50-60% stenosis that is unchanged from prior cardiac caths.  Normal LV systolic function.  Mild elevation LV filling pressures, LVEDP 17 mmHg.    Recommendations: Continue  medical management of CAD. I suspect that his dyspnea is secondary to deconditioning but cannot exclude microvascular disease.    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  The vessel is very small in size and contains luminal irregularities. The right coronary is dominant.  The PDA arises from the distal portion of the mid vessel.  The origin of the PDA contains a 50 to 60% eccentric narrowing. Left main is widely patent. The left main gives origin to the LAD and what should be considered a ramus intermedius that then bifurcates.  The superior most branch of the ramus bifurcation contains a patent stent. The LAD is a large vessel and contains an ostial to mid vessel stent the jails a diagonal distally.  The diagonal contains ostial 50 to 70% narrowing. Normal left ventricular function.  LVEF 55%.   RECOMMENDATIONS:   The previously placed stents are widely patent. Ongoing chest pain without enzyme abnormality or EKG changes suggests the possibility of an alternative explanation. Per treating team   Diagnostic Dominance: Right           Echocardiogram: 02/2019 IMPRESSIONS   1. Left ventricular ejection fraction, by estimation, is 55 to 60%. The  left ventricle has normal function. The left ventricle has no regional  wall motion abnormalities. Left ventricular diastolic parameters were  normal.   2. Right ventricular systolic function is normal. The right ventricular  size is normal. Tricuspid regurgitation signal is inadequate for assessing  PA pressure.   3. Left atrial size was upper normal.   4. Right atrial size was upper normal.   5. The mitral valve is grossly normal. Trivial mitral valve  regurgitation.   6. The aortic valve is tricuspid. Aortic valve regurgitation is mild.   7. The inferior vena cava is normal in size with greater than 50%  respiratory  variability, suggesting right atrial pressure of 3 mmHg   Cardiac catheterization 07/05/2019   Prox LAD to Mid LAD lesion is 70% stenosed. A drug-eluting stent was successfully placed using a STENT RESOLUTE ONYX 3.0X22. Post intervention, there is a 0% residual stenosis. Mid LAD lesion is 50% stenosed. A drug-eluting stent was successfully placed using a STENT RESOLUTE ONYX 2.5X30. Post intervention, there is a 0% residual stenosis. 1st Mrg lesion is 95% stenosed. The left ventricular systolic function is normal. LV end diastolic pressure is normal. The left ventricular ejection fraction is 55-65% by visual estimate.   1. Severe 2 vessel obstructive CAD. Abnormal FFR of the LAD. 2. Normal LV function 3. Normal LVEDP 4. Successful PCI of the ostial to mid LAD with IVUS guidance and DES x 2.    Plan: DAPT for one year. Optimize medical therapy. Smoking cessation. If patient continues to have symptoms despite optimal therapy could consider PCI of the OM but this is a small branch and supplies a relatively small area of myocardium.   Diagnostic Dominance: Right   Intervention         Cardiac catheterization 07/20/2019   Previously placed Mid LAD drug eluting stent is widely patent. Previously placed Prox LAD to Mid LAD drug eluting stent is widely patent. 1st Mrg lesion is 95% stenosed. A drug-eluting stent was successfully placed using a STENT RESOLUTE ONYX 2.25X26, postdilated to 2.6 mm. Post intervention, there is a 0% residual stenosis. The left ventricular systolic function is normal. LV end diastolic pressure is normal. The left ventricular ejection fraction is 55-65% by visual estimate. There is no aortic valve stenosis.   Continue aggressive secondary prevention.  Consider clopidogrel monotherapy after 12 months.    Watch overnight.  Home tomorrow.    He is interested in getting the COVID-19 vaccine.  I encouraged him to get the vaccines when possible.    Diagnostic Dominance: Right Intervention         Recent Labs: 12/23/2020: TSH 1.710 12/30/2020: BUN 9; Creatinine, Ser 0.82; Hemoglobin 14.5; Platelets 234; Potassium 3.8; Sodium 137 05/05/2021: ALT 15  Recent Lipid Panel    Component Value Date/Time   CHOL 59 (L) 05/05/2021 1007   TRIG 51 05/05/2021 1007   HDL 43 05/05/2021 1007   CHOLHDL 1.4 05/05/2021 1007   CHOLHDL 3.2 04/24/2020 0400   VLDL 41 (H) 04/24/2020 0400   LDLCALC 2 05/05/2021 1007   LDLCALC 71 09/01/2019 0859           Physical Exam:    VS:  BP 120/64   Pulse 63   Ht '5\' 6"'$  (1.676 m)   Wt 169 lb 12.8 oz (77 kg)   SpO2 96%   BMI 27.41 kg/m     Wt Readings from Last 3 Encounters:  06/19/21 169 lb 12.8 oz (77 kg)  04/22/21 172 lb 3.2 oz (78.1 kg)  04/08/21 172 lb 3.2 oz (78.1 kg)     GEN:  Comfortable HEENT: Normal NECK: No JVD; No carotid bruits CARDIAC: RRR, no murmurs RESPIRATORY:  CTAB, no wheezing ABDOMEN: Soft, non-tender, non-distended MUSCULOSKELETAL:  No edema; No deformity  SKIN: Warm and dry NEUROLOGIC:  Alert and oriented x 3 PSYCHIATRIC:  Normal affect   ASSESSMENT:    1. Coronary artery disease of native artery of native heart with stable angina pectoris ([place])   2. Essential hypertension   3. Mixed hyperlipidemia   4. Tobacco abuse     PLAN:    In order of problems listed above:  #Multivessel CAD s/p PCI to LAD and OM: Last cath on 12/2020 for chest pain with patent LAD and OM stents, 60% D2, 30% mLAD, 60% RPDA which was overall stable from prior. Recommended for continued medical management. Overall, symptoms are much improved after the initiation of ranexa.  -Continue ASA '81mg'$  daily -Continue lipitor '80mg'$  daily -Continue zetia '10mg'$  daily -Continue plavix '75mg'$  daily -Continue atenolol '50mg'$  daily -Continue imdur '90mg'$  daily -Continue ranexa '500mg'$  BID; can up-titrate as needed -Patient quit smoking -Continue diet and exercising efforts as detailed  below  #HTN: Controlled at home and at goal <120/80s.  -Continue atenolol '50mg'$  daily -Continue imdur '90mg'$  daily -Continue amlodipine '5mg'$  daily  #HLD: LDL 2; goal <55.  -Continue lipitor '80mg'$  daily -Continue zetia '10mg'$  daily -Continue repatha  #Tobacco Abuse: -Quit successfully  #Overweight: Lost 20lbs!! -Continue diet and lifestyle modifications as below  Exercise recommendations: Goal of exercising for at least 30 minutes a day, at least 5 times per week.  Please exercise to a moderate exertion.  This means that while exercising it is difficult to speak in full sentences, however you are not so short of breath that you feel you must stop, and not so comfortable that you can carry on a full conversation.  Exertion level should be approximately a 5/10, if 10 is the most exertion you can perform.  Diet recommendations: Recommend a heart healthy diet such as the Mediterranean diet.  This diet consists of plant based foods, healthy fats, lean meats, olive oil.  It suggests limiting the intake of simple carbohydrates such as white breads, pastries, and pastas.  It also limits the amount of red meat, wine, and  dairy products such as cheese that one should consume on a daily basis.       Medication Adjustments/Labs and Tests Ordered: Current medicines are reviewed at length with the patient today.  Concerns regarding medicines are outlined above.  No orders of the defined types were placed in this encounter.  No orders of the defined types were placed in this encounter.   Patient Instructions  Medication Instructions:  Your physician recommends that you continue on your current medications as directed. Please refer to the Current Medication list given to you today.   Labwork: None  Testing/Procedures: None  Follow-Up: Follow up with Dr. Johney Frame in 6 months.   Any Other Special Instructions Will Be Listed Below (If Applicable).     If you need a refill on your cardiac  medications before your next appointment, please call your pharmacy.    Signed, Freada Bergeron, MD  06/19/2021 2:54 PM    [place]

## 2021-06-19 ENCOUNTER — Ambulatory Visit (INDEPENDENT_AMBULATORY_CARE_PROVIDER_SITE_OTHER): Payer: Medicare Other | Admitting: Cardiology

## 2021-06-19 ENCOUNTER — Encounter: Payer: Self-pay | Admitting: Cardiology

## 2021-06-19 VITALS — BP 120/64 | HR 63 | Ht 66.0 in | Wt 169.8 lb

## 2021-06-19 DIAGNOSIS — I10 Essential (primary) hypertension: Secondary | ICD-10-CM

## 2021-06-19 DIAGNOSIS — E782 Mixed hyperlipidemia: Secondary | ICD-10-CM | POA: Diagnosis not present

## 2021-06-19 DIAGNOSIS — Z720 Tobacco use: Secondary | ICD-10-CM

## 2021-06-19 DIAGNOSIS — I25118 Atherosclerotic heart disease of native coronary artery with other forms of angina pectoris: Secondary | ICD-10-CM | POA: Diagnosis not present

## 2021-06-19 NOTE — Patient Instructions (Signed)
Medication Instructions:  Your physician recommends that you continue on your current medications as directed. Please refer to the Current Medication list given to you today.   Labwork: None  Testing/Procedures: None  Follow-Up: Follow up with Dr. Pemberton in 6 months.   Any Other Special Instructions Will Be Listed Below (If Applicable).     If you need a refill on your cardiac medications before your next appointment, please call your pharmacy.  

## 2021-06-24 ENCOUNTER — Other Ambulatory Visit: Payer: Self-pay

## 2021-06-24 ENCOUNTER — Emergency Department (HOSPITAL_COMMUNITY)
Admission: EM | Admit: 2021-06-24 | Discharge: 2021-06-24 | Disposition: A | Discharge status: Home / Self Care | Payer: Medicare Other | Attending: Emergency Medicine | Admitting: Emergency Medicine

## 2021-06-24 ENCOUNTER — Encounter (HOSPITAL_COMMUNITY): Payer: Self-pay

## 2021-06-24 ENCOUNTER — Emergency Department (HOSPITAL_COMMUNITY): Payer: Medicare Other

## 2021-06-24 DIAGNOSIS — Y92002 Bathroom of unspecified non-institutional (private) residence single-family (private) house as the place of occurrence of the external cause: Secondary | ICD-10-CM | POA: Insufficient documentation

## 2021-06-24 DIAGNOSIS — S299XXA Unspecified injury of thorax, initial encounter: Secondary | ICD-10-CM | POA: Diagnosis present

## 2021-06-24 DIAGNOSIS — W182XXA Fall in (into) shower or empty bathtub, initial encounter: Secondary | ICD-10-CM | POA: Diagnosis not present

## 2021-06-24 DIAGNOSIS — R42 Dizziness and giddiness: Secondary | ICD-10-CM | POA: Diagnosis not present

## 2021-06-24 DIAGNOSIS — I10 Essential (primary) hypertension: Secondary | ICD-10-CM | POA: Diagnosis not present

## 2021-06-24 DIAGNOSIS — Z7902 Long term (current) use of antithrombotics/antiplatelets: Secondary | ICD-10-CM | POA: Insufficient documentation

## 2021-06-24 DIAGNOSIS — J9811 Atelectasis: Secondary | ICD-10-CM | POA: Diagnosis not present

## 2021-06-24 DIAGNOSIS — M47812 Spondylosis without myelopathy or radiculopathy, cervical region: Secondary | ICD-10-CM | POA: Diagnosis not present

## 2021-06-24 DIAGNOSIS — Z79899 Other long term (current) drug therapy: Secondary | ICD-10-CM | POA: Diagnosis not present

## 2021-06-24 DIAGNOSIS — R404 Transient alteration of awareness: Secondary | ICD-10-CM | POA: Diagnosis not present

## 2021-06-24 DIAGNOSIS — S2232XA Fracture of one rib, left side, initial encounter for closed fracture: Secondary | ICD-10-CM | POA: Diagnosis not present

## 2021-06-24 DIAGNOSIS — M542 Cervicalgia: Secondary | ICD-10-CM | POA: Diagnosis not present

## 2021-06-24 DIAGNOSIS — R0781 Pleurodynia: Secondary | ICD-10-CM | POA: Diagnosis not present

## 2021-06-24 DIAGNOSIS — I2510 Atherosclerotic heart disease of native coronary artery without angina pectoris: Secondary | ICD-10-CM | POA: Diagnosis not present

## 2021-06-24 DIAGNOSIS — S2242XA Multiple fractures of ribs, left side, initial encounter for closed fracture: Secondary | ICD-10-CM | POA: Diagnosis not present

## 2021-06-24 DIAGNOSIS — Z743 Need for continuous supervision: Secondary | ICD-10-CM | POA: Diagnosis not present

## 2021-06-24 DIAGNOSIS — Z7982 Long term (current) use of aspirin: Secondary | ICD-10-CM | POA: Diagnosis not present

## 2021-06-24 DIAGNOSIS — Z7901 Long term (current) use of anticoagulants: Secondary | ICD-10-CM | POA: Diagnosis not present

## 2021-06-24 DIAGNOSIS — W19XXXA Unspecified fall, initial encounter: Secondary | ICD-10-CM | POA: Diagnosis not present

## 2021-06-24 NOTE — Discharge Instructions (Addendum)
Please return to the ED with any new symptoms such as fevers, cough with productive sputum Please follow back up with your PCP for further management when it comes to your vertigo as well as your rib fracture You may use Tylenol or ibuprofen at home for pain control Please utilize incentive spirometer we have provided.  Please utilize this 10 times an hour Please also use splinting technique I have described Please read the attached informational guide concerning her fractures

## 2021-06-24 NOTE — ED Provider Notes (Signed)
[facility]GENC[facility] DEPARTMENT Provider Note   CSN: [id number] Arrival date & time: 06/24/21  1311     History  Chief Complaint  Patient presents with   Dizziness    Bill Johnson is a 66 y.o. male with medical history of vertigo, alcohol abuse, CAD, hypertension, GERD, hyperlipidemia, hypertension.  Patient presents ED after being seen at PCP.  Patient states that he had acute onset of severe vertigo Sunday night while in the bathroom.  Patient states that after urinating he stood up, became extremely dizzy, the room started spinning and he fell backwards hitting his head on the bathtub.  Patient currently takes blood thinners, Plavix.  Patient reports that he had blurred vision at this time, no loss of consciousness.  Patient states the episode of vertigo lasted almost 12 hours.  Patient reported to his PCP today complaining of vertigo, feeling very unsteady on his feet.  Patient also has significant rib pain, has a large contusion over the left posterior chest wall.  Patient denies headache.  Patient states that he is still feeling of the room spinning.  Patient denies any nausea, vomiting, headache, chest pain, shortness of breath, numbness.  Patient does endorse however neck pain, dizziness.   Dizziness Associated symptoms: no chest pain, no headaches, no nausea, no shortness of breath, no vomiting and no weakness       Home Medications Prior to Admission medications   Medication Sig Start Date End Date Taking? Authorizing Provider  acetaminophen (TYLENOL) 500 MG tablet Take 1,000 mg by mouth every 6 (six) hours as needed for mild pain or headache.    [provider]  albuterol (VENTOLIN HFA) 108 (90 Base) MCG/ACT inhaler Inhale 2 puffs into the lungs every 6 (six) hours as needed for wheezing or shortness of breath. 06/10/20   Lindell Spar, MD  amLODipine (NORVASC) 5 MG tablet Take 1 tablet (5 mg total) by mouth daily. 07/01/20 07/01/21  Lindell Spar, MD  aspirin 81 MG tablet Take 81 mg by mouth daily.    [provider]  atenolol (TENORMIN) 50 MG tablet Take 1 tablet (50 mg total) by mouth daily. 03/06/21   Lindell Spar, MD  atorvastatin (LIPITOR) 80 MG tablet Take 1 tablet (80 mg total) by mouth every evening. 12/10/20   Lindell Spar, MD  cetirizine (ZYRTEC) 10 MG tablet Take 10 mg by mouth daily.    [provider]  Cholecalciferol (VITAMIN D3) 50 MCG (2000 UT) CAPS Take 2,000 Units by mouth daily.    [provider]  clopidogrel (PLAVIX) 75 MG tablet Take 1 tablet (75 mg total) by mouth daily. 04/22/21   Lindell Spar, MD  [facility] VITAMIN B-12 2000 MCG TBCR Take 2,000 mcg by mouth daily.    [provider]  diazepam (VALIUM) 5 MG tablet Take 1 tablet (5 mg total) by mouth every 12 (twelve) hours as needed for anxiety. 04/22/21   Lindell Spar, MD  Evolocumab (REPATHA SURECLICK) 518 MG/ML SOAJ Inject 140 mg into the skin every 14 (fourteen) days. 03/11/21   Freada Bergeron, MD  ezetimibe (ZETIA) 10 MG tablet Take 1 tablet (10 mg total) by mouth daily. 02/18/21   Freada Bergeron, MD  isosorbide mononitrate (IMDUR) 60 MG 24 hr tablet Take 1.5 tablets (90 mg total) by mouth daily. 12/30/20 03/30/21  Freada Bergeron, MD  meclizine (ANTIVERT) 25 MG tablet TAKE 1 TABLET BY MOUTH THREE TIMES DAILY AS NEEDED FOR  DIZZINESS 02/05/21   Lindell Spar, MD  nitroGLYCERIN (NITROSTAT) 0.4 MG SL tablet Place 1 tablet (0.4 mg total) under the tongue every 5 (five) minutes as needed for chest pain (up to 3 doses). 05/06/20 06/19/21  Verta Ellen., NP  pantoprazole (PROTONIX) 40 MG tablet Take 1 tablet (40 mg total) by mouth 2 (two) times daily. 12/10/20   Lindell Spar, MD  ranolazine (RANEXA) 500 MG 12 hr tablet Take 1 tablet (500 mg total) by mouth 2 (two) times daily. 12/30/20   Freada Bergeron, MD      Allergies    Patient has no known allergies.    Review of Systems   Review of Systems   Constitutional:  Negative for chills and fever.  Respiratory:  Negative for shortness of breath.   Cardiovascular:  Negative for chest pain.  Gastrointestinal:  Negative for nausea and vomiting.  Musculoskeletal:  Positive for neck pain.  Neurological:  Positive for dizziness. Negative for weakness, light-headedness, numbness and headaches.  All other systems reviewed and are negative.  Physical Exam Updated Vital Signs BP 112/70   Pulse 62   Temp (!) 97.4 F (36.3 C) (Oral)   Resp 16   Ht '5\' 6"'$  (1.676 m)   Wt 76.7 kg   SpO2 92%   BMI 27.28 kg/m  Physical Exam Vitals and nursing note reviewed.  Constitutional:      General: He is not in acute distress.    Appearance: Normal appearance. He is not ill-appearing, toxic-appearing or diaphoretic.  HENT:     Head: Normocephalic and atraumatic.     Right Ear: Tympanic membrane normal. There is no impacted cerumen.     Left Ear: Tympanic membrane normal. There is no impacted cerumen.     Nose: Nose normal. No congestion.     Mouth/Throat:     Mouth: Mucous membranes are moist.     Pharynx: Oropharynx is clear. No oropharyngeal exudate.     Comments: Extensive dental caries Eyes:     Extraocular Movements: Extraocular movements intact.     Conjunctiva/sclera: Conjunctivae normal.     Pupils: Pupils are equal, round, and reactive to light.  Neck:     Comments: No step-off, no deformity, no crepitus Cardiovascular:     Rate and Rhythm: Normal rate and regular rhythm.  Pulmonary:     Effort: Pulmonary effort is normal.     Breath sounds: Normal breath sounds. No wheezing.  Abdominal:     General: Abdomen is flat. Bowel sounds are normal.     Palpations: Abdomen is soft.     Tenderness: There is no abdominal tenderness.  Musculoskeletal:     Cervical back: Normal range of motion and neck supple. No rigidity or tenderness.       Back:     Right lower leg: No edema.     Left lower leg: No edema.  Skin:    General: Skin is  warm and dry.     Capillary Refill: Capillary refill takes less than 2 seconds.  Neurological:     General: No focal deficit present.     Mental Status: He is alert and oriented to person, place, and time.     GCS: GCS eye subscore is 4. GCS verbal subscore is 5. GCS motor subscore is 6.     Cranial Nerves: Cranial nerves 2-12 are intact. No cranial nerve deficit.     Sensory: Sensation is intact. No sensory deficit.  Motor: Motor function is intact. No weakness.     Coordination: Heel to Shin Test normal.       ED Results / Procedures / Treatments   Labs (all labs ordered are listed, but only abnormal results are displayed) Labs Reviewed - No data to display  EKG None  Radiology DG Ribs Unilateral W/Chest Left  Result Date: 06/24/2021 CLINICAL DATA:  Fall.  Left chest pain EXAM: LEFT RIBS AND CHEST - 3+ VIEW COMPARISON:  Chest two-view 04/23/2020 FINDINGS: Negative for heart failure. Mild bibasilar atelectasis. No effusion or pneumothorax Mildly displaced fracture left ninth rib posteriorly. No other fracture. IMPRESSION: Fracture left ninth rib.  Mild bibasilar atelectasis. Electronically Signed   By: Franchot Gallo M.D.   On: 06/24/2021 14:56   CT Head Wo Contrast  Result Date: 06/24/2021 CLINICAL DATA:  [place] 2 days ago.  Dizziness. EXAM: CT HEAD WITHOUT CONTRAST CT CERVICAL SPINE WITHOUT CONTRAST TECHNIQUE: Multidetector CT imaging of the head and cervical spine was performed following the standard protocol without intravenous contrast. Multiplanar CT image reconstructions of the cervical spine were also generated. RADIATION DOSE REDUCTION: This exam was performed according to the departmental dose-optimization program which includes automated exposure control, adjustment of the mA and/or kV according to patient size and/or use of iterative reconstruction technique. COMPARISON:  None Available. FINDINGS: CT HEAD FINDINGS Brain: No evidence of acute infarction, hemorrhage,  hydrocephalus, extra-axial collection or mass lesion/mass effect. Vascular: Scattered vascular calcifications but no aneurysm or hyperdense vessels. Skull: No skull fracture or bone lesion. Sinuses/Orbits: The paranasal sinuses and mastoid air cells are clear except for a mucous retention cyst or polyp in the left maxillary sinus. The globes are intact. Other: No scalp lesions or scalp hematoma. CT CERVICAL SPINE FINDINGS Alignment: Age advanced degenerative cervical spondylosis with multilevel disc disease and facet disease. Degenerative subluxations noted at C5 and C6. Skull base and vertebrae: No acute fracture. No primary bone lesion or focal pathologic process. Soft tissues and spinal canal: No prevertebral fluid or swelling. No visible canal hematoma. Disc levels: Advanced degenerative cervical spondylosis with multilevel disc disease and facet disease. No large disc protrusions or significant canal stenosis. There is significant multilevel bony foraminal stenosis. Upper chest: The lung apices are grossly clear. Other: No neck mass, adenopathy or hematoma. Age advanced atherosclerotic calcifications involving both carotid arteries. IMPRESSION: 1. No acute intracranial findings or skull fracture. 2. Advanced degenerative cervical spondylosis with multilevel disc disease and facet disease but no acute cervical spine fracture. 3. Age advanced atherosclerotic calcifications involving both carotid arteries. Electronically Signed   By: Marijo Sanes M.D.   On: 06/24/2021 14:46   CT Cervical Spine Wo Contrast  Result Date: 06/24/2021 CLINICAL DATA:  [place] 2 days ago.  Dizziness. EXAM: CT HEAD WITHOUT CONTRAST CT CERVICAL SPINE WITHOUT CONTRAST TECHNIQUE: Multidetector CT imaging of the head and cervical spine was performed following the standard protocol without intravenous contrast. Multiplanar CT image reconstructions of the cervical spine were also generated. RADIATION DOSE REDUCTION: This exam was performed  according to the departmental dose-optimization program which includes automated exposure control, adjustment of the mA and/or kV according to patient size and/or use of iterative reconstruction technique. COMPARISON:  None Available. FINDINGS: CT HEAD FINDINGS Brain: No evidence of acute infarction, hemorrhage, hydrocephalus, extra-axial collection or mass lesion/mass effect. Vascular: Scattered vascular calcifications but no aneurysm or hyperdense vessels. Skull: No skull fracture or bone lesion. Sinuses/Orbits: The paranasal sinuses and mastoid air cells are clear except for a mucous retention  cyst or polyp in the left maxillary sinus. The globes are intact. Other: No scalp lesions or scalp hematoma. CT CERVICAL SPINE FINDINGS Alignment: Age advanced degenerative cervical spondylosis with multilevel disc disease and facet disease. Degenerative subluxations noted at C5 and C6. Skull base and vertebrae: No acute fracture. No primary bone lesion or focal pathologic process. Soft tissues and spinal canal: No prevertebral fluid or swelling. No visible canal hematoma. Disc levels: Advanced degenerative cervical spondylosis with multilevel disc disease and facet disease. No large disc protrusions or significant canal stenosis. There is significant multilevel bony foraminal stenosis. Upper chest: The lung apices are grossly clear. Other: No neck mass, adenopathy or hematoma. Age advanced atherosclerotic calcifications involving both carotid arteries. IMPRESSION: 1. No acute intracranial findings or skull fracture. 2. Advanced degenerative cervical spondylosis with multilevel disc disease and facet disease but no acute cervical spine fracture. 3. Age advanced atherosclerotic calcifications involving both carotid arteries. Electronically Signed   By: Marijo Sanes M.D.   On: 06/24/2021 14:46    Procedures Procedures   Medications Ordered in ED Medications - No data to display  ED Course/ Medical Decision Making/  A&P                           Medical Decision Making Amount and/or Complexity of Data Reviewed Radiology: ordered.   66 year old male presents to the ED for evaluation.  Please see HPI for further details.  On examination the patient is afebrile and nontachycardic.  The patient lung sounds are clear bilaterally and he is not hypoxic on room air.  Patient's abdomen is soft and compressible all 4 quadrants.  Patient has tenderness to his left posterior chest wall with significant bruising overlying.  Patient neurological examination shows no focal neurodeficits.  The patient is alert and orient x3.  The patient will follow commands appropriately.  Patient worked up utilizing the following imaging studies interpreted by me personally: - CT C-spine shows no fracture, subluxation - CT head without contrast shows no intracranial abnormality, midline shift, herniation, bleed - Plain film imaging of left chest ribs show mildly displaced ninth left posterior rib fracture  Patient provided with incentive spirometer.  Patient counseled on splinting techniques.  Patient discussed with my attending Dr. Langston Masker who voices agreement with the plan of management.  Patient be discharged home with close outpatient follow-up with his PCP.  The patient will be advised to utilize his incentive spirometer, counseled on signs and symptoms of pneumonia.  Prior to discharge, the patient showed the ability to ambulate on his own around the entire department.  The patient states that his dizziness is no longer present.  Patient given return precautions and he voiced understanding.  The patient had all of his questions answered to his satisfaction.  The patient is stable for discharge home.   Final Clinical Impression(s) / ED Diagnoses Final diagnoses:  Closed fracture of one rib of left side, initial encounter  Dizziness    Rx / DC Orders ED Discharge Orders     None         Azucena Cecil,  PA-C 06/24/21 1627    Wyvonnia Dusky, MD 06/24/21 [phone]

## 2021-06-24 NOTE — ED Triage Notes (Signed)
Pt BIB [facility] from home d/t worsened dizziness. Per pt, he has chronic vertigo and it had worsened since the fall he had 2 days ago. Pt stated he hit his head when he fell. No LOC.On blood thinner.  Pt on Meclizine daily. A&O X4. VSS.

## 2021-06-26 DIAGNOSIS — I10 Essential (primary) hypertension: Secondary | ICD-10-CM | POA: Diagnosis not present

## 2021-06-26 DIAGNOSIS — Z7901 Long term (current) use of anticoagulants: Secondary | ICD-10-CM | POA: Diagnosis not present

## 2021-06-26 DIAGNOSIS — R0781 Pleurodynia: Secondary | ICD-10-CM | POA: Diagnosis not present

## 2021-06-26 DIAGNOSIS — R42 Dizziness and giddiness: Secondary | ICD-10-CM | POA: Diagnosis not present

## 2021-06-26 DIAGNOSIS — Z955 Presence of coronary angioplasty implant and graft: Secondary | ICD-10-CM | POA: Diagnosis not present

## 2021-06-26 DIAGNOSIS — W19XXXA Unspecified fall, initial encounter: Secondary | ICD-10-CM | POA: Diagnosis not present

## 2021-06-30 DIAGNOSIS — M12811 Other specific arthropathies, not elsewhere classified, right shoulder: Secondary | ICD-10-CM | POA: Diagnosis not present

## 2021-06-30 DIAGNOSIS — M12812 Other specific arthropathies, not elsewhere classified, left shoulder: Secondary | ICD-10-CM | POA: Diagnosis not present

## 2021-07-10 DIAGNOSIS — J449 Chronic obstructive pulmonary disease, unspecified: Secondary | ICD-10-CM | POA: Diagnosis not present

## 2021-07-10 DIAGNOSIS — I10 Essential (primary) hypertension: Secondary | ICD-10-CM | POA: Diagnosis not present

## 2021-07-10 DIAGNOSIS — E782 Mixed hyperlipidemia: Secondary | ICD-10-CM | POA: Diagnosis not present

## 2021-07-30 DIAGNOSIS — M2351 Chronic instability of knee, right knee: Secondary | ICD-10-CM | POA: Diagnosis not present

## 2021-07-30 DIAGNOSIS — M1711 Unilateral primary osteoarthritis, right knee: Secondary | ICD-10-CM | POA: Diagnosis not present

## 2021-07-31 DIAGNOSIS — H9191 Unspecified hearing loss, right ear: Secondary | ICD-10-CM | POA: Diagnosis not present

## 2021-07-31 DIAGNOSIS — I10 Essential (primary) hypertension: Secondary | ICD-10-CM | POA: Diagnosis not present

## 2021-07-31 DIAGNOSIS — J449 Chronic obstructive pulmonary disease, unspecified: Secondary | ICD-10-CM | POA: Diagnosis not present

## 2021-07-31 DIAGNOSIS — H6121 Impacted cerumen, right ear: Secondary | ICD-10-CM | POA: Diagnosis not present

## 2021-08-11 DIAGNOSIS — M2351 Chronic instability of knee, right knee: Secondary | ICD-10-CM | POA: Diagnosis not present

## 2021-08-11 DIAGNOSIS — M12812 Other specific arthropathies, not elsewhere classified, left shoulder: Secondary | ICD-10-CM | POA: Diagnosis not present

## 2021-08-11 DIAGNOSIS — M12811 Other specific arthropathies, not elsewhere classified, right shoulder: Secondary | ICD-10-CM | POA: Diagnosis not present

## 2021-08-25 ENCOUNTER — Ambulatory Visit: Payer: Medicare Other | Admitting: Internal Medicine

## 2021-08-25 DIAGNOSIS — M25561 Pain in right knee: Secondary | ICD-10-CM | POA: Diagnosis not present

## 2021-08-25 DIAGNOSIS — M1711 Unilateral primary osteoarthritis, right knee: Secondary | ICD-10-CM | POA: Diagnosis not present

## 2021-08-25 DIAGNOSIS — G8929 Other chronic pain: Secondary | ICD-10-CM | POA: Diagnosis not present

## 2021-09-02 DIAGNOSIS — I10 Essential (primary) hypertension: Secondary | ICD-10-CM | POA: Diagnosis not present

## 2021-09-02 DIAGNOSIS — G8929 Other chronic pain: Secondary | ICD-10-CM | POA: Diagnosis not present

## 2021-09-02 DIAGNOSIS — E782 Mixed hyperlipidemia: Secondary | ICD-10-CM | POA: Diagnosis not present

## 2021-09-02 DIAGNOSIS — H9191 Unspecified hearing loss, right ear: Secondary | ICD-10-CM | POA: Diagnosis not present

## 2021-09-02 DIAGNOSIS — Z955 Presence of coronary angioplasty implant and graft: Secondary | ICD-10-CM | POA: Diagnosis not present

## 2021-09-02 DIAGNOSIS — I700 Atherosclerosis of aorta: Secondary | ICD-10-CM | POA: Diagnosis not present

## 2021-09-02 DIAGNOSIS — J449 Chronic obstructive pulmonary disease, unspecified: Secondary | ICD-10-CM | POA: Diagnosis not present

## 2021-09-02 DIAGNOSIS — M25561 Pain in right knee: Secondary | ICD-10-CM | POA: Diagnosis not present

## 2021-10-13 DIAGNOSIS — M47812 Spondylosis without myelopathy or radiculopathy, cervical region: Secondary | ICD-10-CM | POA: Diagnosis not present

## 2021-11-11 DIAGNOSIS — M12811 Other specific arthropathies, not elsewhere classified, right shoulder: Secondary | ICD-10-CM | POA: Diagnosis not present

## 2021-11-11 DIAGNOSIS — M25412 Effusion, left shoulder: Secondary | ICD-10-CM | POA: Diagnosis not present

## 2021-11-11 DIAGNOSIS — M12812 Other specific arthropathies, not elsewhere classified, left shoulder: Secondary | ICD-10-CM | POA: Diagnosis not present

## 2021-11-11 DIAGNOSIS — M25411 Effusion, right shoulder: Secondary | ICD-10-CM | POA: Diagnosis not present

## 2021-11-11 DIAGNOSIS — M7989 Other specified soft tissue disorders: Secondary | ICD-10-CM | POA: Diagnosis not present

## 2021-11-17 DIAGNOSIS — M7989 Other specified soft tissue disorders: Secondary | ICD-10-CM | POA: Diagnosis not present

## 2021-11-17 DIAGNOSIS — M12811 Other specific arthropathies, not elsewhere classified, right shoulder: Secondary | ICD-10-CM | POA: Diagnosis not present

## 2021-11-17 DIAGNOSIS — M12812 Other specific arthropathies, not elsewhere classified, left shoulder: Secondary | ICD-10-CM | POA: Diagnosis not present

## 2021-12-02 DIAGNOSIS — M47812 Spondylosis without myelopathy or radiculopathy, cervical region: Secondary | ICD-10-CM | POA: Diagnosis not present

## 2021-12-10 ENCOUNTER — Other Ambulatory Visit: Payer: Self-pay

## 2021-12-10 MED ORDER — EZETIMIBE 10 MG PO TABS: 10.0000 mg | ORAL_TABLET | Freq: Every day | ORAL | 3 refills | Status: DC

## 2021-12-18 NOTE — Progress Notes (Signed)
Cardiology Office Note:    Date:  12/22/2021   ID:  Bill Johnson, DOB 1955/02/22, MRN [id number]  PCP:  Garnet Sierras, NP   [facility] Providers Cardiologist:  None {   Referring MD: Garnet Sierras, NP    History of Present Illness:    Bill Johnson is a 66 y.o. male with a hx of HTN, GERD with esophagitis, COPD, OA, cervical spondylosis, anxiety, tobacco and alcohol abuse, CAD s/p DESx2 to o-mLAD 07/05/19 for CP and +CCTA, then DES to 95% OM1 stenosis 07/20/19 for residual symptoms with recent cath for chest pain with stable disease who now presents to clinic for follow-up.  Patient admitted from 04/23/20-04/25/20 for chest pain. Cath done on that admission showed widely patent previously placed stents in mid-LAD (mild ISR), prox-to-mid LAD, and OM1 with no new lesions. He was then discharged on medical management. Of note, during previous admission for chest pain it was thought that his jailed diagonal may be the source of his discomfort.  Saw me in clinic on 12/30/20 as urgent visit where he was having significant chest pain with minimal exertion and decreased exercise capacity. We referred for repeat cath on 01/02/2021 patent LAD and OM stents, moderate D1 disease, moderate PDA disease which was overall stable. Recommended for continued medical management.  Was seen in clinic on  01/2021 where he was doing well. Was still having SOB so we referred to [facility] who recommended prn symbicort.  Was last seen on 05/2021 where he was feeling significantly improved with increased exercise tolerance. Had lost 20lbs.  Today, the patient states he has gained weight due to knee pain. He was seen by orthopedics who state he needs a knee replacement but they want him to have dental work prior to the OR. He states he is supposed to have 15 teeth pulled but is having difficulty finding a dentist who will take his insurance. We discussed looking at medical schools as an option.  He also  states he had an episode of falling after turning in the bathroom quickly. No syncope but felt like he was hit suddenly and then he was "all of a sudden on the ground."  Was seen in the ER 05/2021 where CT head with no acute pathology. CT-c spine with no fracture. He was seen in follow-up with his PCP and his atenolol was cut back into '25mg'$  and he has had no further episodes.   He is otherwise stable from a CV standpoint. No chest pain, SOB, orthopnea or PND. Has mild LE edema that improves with leg elevation. Has some mild dizziness when going from squatting to standing position or with bending over. Notably, BP in low 100s today.   Past Medical History:  Diagnosis Date   Alcohol abuse    Allergy    seasonal   Anxiety    CAD (coronary artery disease)    a. 06/2019 cath - s/p successful PCI of the ostial to mid LAD with IVUS guidance and DES x 2.   Depression    Essential hypertension    GERD (gastroesophageal reflux disease)    HOH (hard of hearing)    Hyperlipidemia    Hypertension    OA (osteoarthritis) of knee    Pulmonary nodules    a. seen on coronary CT 05/2019, will need OP f/u.   Rotator cuff tear arthropathy of both shoulders    On DISABILITY, non operative case   Shoulder pain    Tobacco abuse  Vertigo     Past Surgical History:  Procedure Laterality Date   CARDIAC CATHETERIZATION  06/2005   no significant CAD   COLONOSCOPY WITH PROPOFOL N/A 05/01/2019   Procedure: COLONOSCOPY WITH PROPOFOL;  Surgeon: Daneil Dolin, MD;  [facility];  Service: Endoscopy;  Laterality: N/A;  7:30am   COLONOSCOPY WITH PROPOFOL N/A 03/07/2020   Procedure: COLONOSCOPY WITH PROPOFOL;  Surgeon: Daneil Dolin, MD;  [facility];  Service: Endoscopy;  Laterality: N/A;  8:30am   CORONARY STENT INTERVENTION N/A 07/05/2019   Procedure: CORONARY STENT INTERVENTION;  Surgeon: Martinique, Peter M, MD;  Location: [place] [facility];  Service: Cardiovascular;  Laterality: N/A;    CORONARY STENT INTERVENTION N/A 07/20/2019   Procedure: CORONARY STENT INTERVENTION;  Surgeon: Jettie Booze, MD;  Location: [place] [facility];  Service: Cardiovascular;  Laterality: N/A;   ESOPHAGOGASTRODUODENOSCOPY  08/2005   Dr. Henrene Pastor: small hiatal hernia   HERNIA REPAIR  1983   INTRAVASCULAR PRESSURE WIRE/FFR STUDY N/A 07/05/2019   Procedure: INTRAVASCULAR PRESSURE WIRE/FFR STUDY;  Surgeon: Martinique, Peter M, MD;  Location: [place] [facility];  Service: Cardiovascular;  Laterality: N/A;   INTRAVASCULAR ULTRASOUND/IVUS N/A 07/05/2019   Procedure: Intravascular Ultrasound/IVUS;  Surgeon: Martinique, Peter M, MD;  Location: [place] [facility];  Service: Cardiovascular;  Laterality: N/A;   LEFT HEART CATH AND CORONARY ANGIOGRAPHY N/A 07/05/2019   Procedure: LEFT HEART CATH AND CORONARY ANGIOGRAPHY;  Surgeon: Martinique, Peter M, MD;  Location: [place] [facility];  Service: Cardiovascular;  Laterality: N/A;   LEFT HEART CATH AND CORONARY ANGIOGRAPHY N/A 07/20/2019   Procedure: LEFT HEART CATH AND CORONARY ANGIOGRAPHY;  Surgeon: Jettie Booze, MD;  Location: [place] [facility];  Service: Cardiovascular;  Laterality: N/A;   LEFT HEART CATH AND CORONARY ANGIOGRAPHY N/A 04/24/2020   Procedure: LEFT HEART CATH AND CORONARY ANGIOGRAPHY;  Surgeon: Belva Crome, MD;  Location: [place] [facility];  Service: Cardiovascular;  Laterality: N/A;   LEFT HEART CATH AND CORONARY ANGIOGRAPHY N/A 01/02/2021   Procedure: LEFT HEART CATH AND CORONARY ANGIOGRAPHY;  Surgeon: Burnell Blanks, MD;  Location: [place] [facility];  Service: Cardiovascular;  Laterality: N/A;   POLYPECTOMY  05/01/2019   Procedure: POLYPECTOMY;  Surgeon: Daneil Dolin, MD;  [facility];  Service: Endoscopy;;   POLYPECTOMY  03/07/2020   Procedure: POLYPECTOMY;  Surgeon: Daneil Dolin, MD;  [facility];  Service: Endoscopy;;   ROTATOR CUFF REPAIR Bilateral    TONSILLECTOMY  1977    Current Medications: Current  Meds  Medication Sig   acetaminophen (TYLENOL) 500 MG tablet Take 1,000 mg by mouth every 6 (six) hours as needed for mild pain or headache.   albuterol (VENTOLIN HFA) 108 (90 Base) MCG/ACT inhaler Inhale 2 puffs into the lungs every 6 (six) hours as needed for wheezing or shortness of breath.   aspirin 81 MG tablet Take 81 mg by mouth daily.   atenolol (TENORMIN) 25 MG tablet Take 1 tablet (25 mg total) by mouth daily.   atorvastatin (LIPITOR) 80 MG tablet Take 1 tablet (80 mg total) by mouth every evening.   cetirizine (ZYRTEC) 10 MG tablet Take 10 mg by mouth daily.   Cholecalciferol (VITAMIN D3) 50 MCG (2000 UT) CAPS Take 2,000 Units by mouth daily.   clopidogrel (PLAVIX) 75 MG tablet Take 1 tablet (75 mg total) by mouth daily.   CVS VITAMIN B-12 2000 MCG TBCR Take 2,000 mcg by  mouth daily.   diazepam (VALIUM) 5 MG tablet Take 1 tablet (5 mg total) by mouth every 12 (twelve) hours as needed for anxiety.   Evolocumab (REPATHA SURECLICK) 938 MG/ML SOAJ Inject 140 mg into the skin every 14 (fourteen) days.   isosorbide mononitrate (IMDUR) 60 MG 24 hr tablet Take 1.5 tablets (90 mg total) by mouth daily.   meclizine (ANTIVERT) 25 MG tablet TAKE 1 TABLET BY MOUTH THREE TIMES DAILY AS NEEDED FOR DIZZINESS   nitroGLYCERIN (NITROSTAT) 0.4 MG SL tablet Place 1 tablet (0.4 mg total) under the tongue every 5 (five) minutes as needed for chest pain (up to 3 doses).   pantoprazole (PROTONIX) 40 MG tablet Take 1 tablet (40 mg total) by mouth 2 (two) times daily.   [DISCONTINUED] amLODipine (NORVASC) 5 MG tablet Take 1 tablet (5 mg total) by mouth daily.   [DISCONTINUED] atenolol (TENORMIN) 50 MG tablet Take 1 tablet (50 mg total) by mouth daily. (Patient taking differently: Take 25 mg by mouth daily.)   [DISCONTINUED] ezetimibe (ZETIA) 10 MG tablet Take 1 tablet (10 mg total) by mouth daily.   [DISCONTINUED] ranolazine (RANEXA) 500 MG 12 hr tablet Take 1 tablet (500 mg total) by mouth 2 (two) times daily.      Allergies:   Patient has no known allergies.   Social History   Socioeconomic History   Marital status: Divorced    Spouse name: Not on file   Number of children: 2   Years of education: 12   Highest education level: Not on file  Occupational History   Occupation: Self-employed Designer, television/film set)  Tobacco Use   Smoking status: Former    Packs/day: 1.00    Years: 43.00    Total pack years: 43.00    Types: Cigarettes    Start date: 04/15/1970    Quit date: 07/26/2020    Years since quitting: 1.4   Smokeless tobacco: Never  Vaping Use   Vaping Use: Never used  Substance and Sexual Activity   Alcohol use: Not Currently    Comment: Drinks 3-4 beers/day    Drug use: No   Sexual activity: Yes  Other Topics Concern   Not on file  Social History Narrative   Lives at home with nephew and niece in-law   Caffeine use: Drinks coffee/soda every day   Social Determinants of Radio broadcast assistant Strain: Not on file  Food Insecurity: Not on file  Transportation Needs: Not on file  Physical Activity: Not on file  Stress: Not on file  Social Connections: Not on file     Family History: The patient's family history includes Cancer in his brother and another family member; Diabetes in his mother; Heart disease in his father and mother; Hypertension in his father and mother; Stroke in an other family member. There is no history of Colon cancer.  ROS:   Please see the history of present illness.    Review of Systems  Constitutional:  Negative for malaise/fatigue.  Respiratory:  Positive for shortness of breath.   Cardiovascular:  Negative for chest pain, palpitations, orthopnea, claudication, leg swelling and PND.  Gastrointestinal:  Negative for blood in stool and melena.  Genitourinary:  Negative for hematuria.  Musculoskeletal:  Positive for back pain and joint pain.  Neurological:  Negative for dizziness and loss of consciousness.     EKGs/Labs/Other Studies Reviewed:     The following studies were reviewed today: 01/02/21:   2nd Diag lesion is 60% stenosed.   RPDA  lesion is 60% stenosed.   RV Branch lesion is 10% stenosed.   1st Mrg lesion is 10% stenosed.   Mid LAD lesion is 30% stenosed.   The left ventricular systolic function is normal.   LV end diastolic pressure is normal.   The left ventricular ejection fraction is 55-65% by visual estimate.   There is no mitral valve regurgitation.   Patent mid LAD stents without significant restenosis. Moderate ostial Diagonal branch stenosis, jailed by LAD stent with normal flow.  Patent obtuse marginal branch stent without restenosis. This vessel arises from what may be a large intermediate branch. 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 The proximal segment of the PDA has a 50-60% stenosis that is unchanged from prior cardiac caths.  Normal LV systolic function.  Mild elevation LV filling pressures, LVEDP 17 mmHg.    Recommendations: Continue medical management of CAD. I suspect that his dyspnea is secondary to deconditioning but cannot exclude microvascular disease.    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  The vessel is very small in size and contains luminal irregularities. The right coronary is dominant.  The PDA arises from the distal portion of the mid vessel.  The origin of the PDA contains a 50 to 60% eccentric narrowing. Left main is widely patent. The left main gives origin to the LAD and what should be considered a ramus intermedius that then bifurcates.  The superior most branch of the ramus bifurcation contains a patent stent. The LAD is a large vessel and contains an ostial to mid vessel stent the jails a diagonal distally.  The diagonal contains ostial 50  to 70% narrowing. Normal left ventricular function.  LVEF 55%.   RECOMMENDATIONS:   The previously placed stents are widely patent. Ongoing chest pain without enzyme abnormality or EKG changes suggests the possibility of an alternative explanation. Per treating team   Diagnostic Dominance: Right           Echocardiogram: 02/2019 IMPRESSIONS   1. Left ventricular ejection fraction, by estimation, is 55 to 60%. The  left ventricle has normal function. The left ventricle has no regional  wall motion abnormalities. Left ventricular diastolic parameters were  normal.   2. Right ventricular systolic function is normal. The right ventricular  size is normal. Tricuspid regurgitation signal is inadequate for assessing  PA pressure.   3. Left atrial size was upper normal.   4. Right atrial size was upper normal.   5. The mitral valve is grossly normal. Trivial mitral valve  regurgitation.   6. The aortic valve is tricuspid. Aortic valve regurgitation is mild.   7. The inferior vena cava is normal in size with greater than 50%  respiratory variability, suggesting right atrial pressure of 3 mmHg   Cardiac catheterization 07/05/2019   Prox LAD to Mid LAD lesion is 70% stenosed. A drug-eluting stent was successfully placed using a STENT RESOLUTE ONYX 3.0X22. Post intervention, there is a 0% residual stenosis. Mid LAD lesion is 50% stenosed. A drug-eluting stent was successfully placed using a STENT RESOLUTE ONYX 2.5X30. Post intervention, there is a 0% residual stenosis. 1st Mrg lesion is 95% stenosed. The left ventricular systolic function is normal. LV end diastolic pressure is normal. The left ventricular  ejection fraction is 55-65% by visual estimate.   1. Severe 2 vessel obstructive CAD. Abnormal FFR of the LAD. 2. Normal LV function 3. Normal LVEDP 4. Successful PCI of the ostial to mid LAD with IVUS guidance and DES x 2.    Plan: DAPT for one year. Optimize medical therapy.  Smoking cessation. If patient continues to have symptoms despite optimal therapy could consider PCI of the OM but this is a small branch and supplies a relatively small area of myocardium.   Diagnostic Dominance: Right   Intervention         Cardiac catheterization 07/20/2019   Previously placed Mid LAD drug eluting stent is widely patent. Previously placed Prox LAD to Mid LAD drug eluting stent is widely patent. 1st Mrg lesion is 95% stenosed. A drug-eluting stent was successfully placed using a STENT RESOLUTE ONYX 2.25X26, postdilated to 2.6 mm. Post intervention, there is a 0% residual stenosis. The left ventricular systolic function is normal. LV end diastolic pressure is normal. The left ventricular ejection fraction is 55-65% by visual estimate. There is no aortic valve stenosis.   Continue aggressive secondary prevention.  Consider clopidogrel monotherapy after 12 months.    Watch overnight.  Home tomorrow.    He is interested in getting the COVID-19 vaccine.  I encouraged him to get the vaccines when possible.   Diagnostic Dominance: Right Intervention         Recent Labs: 12/23/2020: TSH 1.710 12/30/2020: BUN 9; Creatinine, Ser 0.82; Hemoglobin 14.5; Platelets 234; Potassium 3.8; Sodium 137 05/05/2021: ALT 15  Recent Lipid Panel    Component Value Date/Time   CHOL 59 (L) 05/05/2021 1007   TRIG 51 05/05/2021 1007   HDL 43 05/05/2021 1007   CHOLHDL 1.4 05/05/2021 1007   CHOLHDL 3.2 04/24/2020 0400   VLDL 41 (H) 04/24/2020 0400   LDLCALC 2 05/05/2021 1007   LDLCALC 71 09/01/2019 0859           Physical Exam:    VS:  BP 102/64   Pulse 75   Ht '5\' 6"'$  (1.676 m)   Wt 185 lb 6.4 oz (84.1 kg)   SpO2 94%   BMI 29.92 kg/m     Wt Readings from Last 3 Encounters:  12/22/21 185 lb 6.4 oz (84.1 kg)  06/24/21 169 lb (76.7 kg)  06/19/21 169 lb 12.8 oz (77 kg)     GEN:  Comfortable HEENT: Normal NECK: No JVD; No carotid bruits CARDIAC: RRR, no  murmurs RESPIRATORY:  Clear bilaterally with no wheezing ABDOMEN: Soft, non-tender, non-distended MUSCULOSKELETAL:  No edema; No deformity  SKIN: Warm and dry NEUROLOGIC:  Alert and oriented x 3 PSYCHIATRIC:  Normal affect   ASSESSMENT:    1. Coronary artery disease of native artery of native heart with stable angina pectoris ([place])   2. Mixed hyperlipidemia   3. Essential hypertension   4. Tobacco abuse   5. Stable angina pectoris      PLAN:    In order of problems listed above:  #Multivessel CAD s/p PCI to LAD and OM: Last cath on 12/2020 for chest pain with patent LAD and OM stents, 60% D2, 30% mLAD, 60% RPDA which was overall stable from prior. Recommended for continued medical management. Overall, symptoms are much improved after the initiation of ranexa.  -Continue ASA '81mg'$  daily -Continue lipitor '80mg'$  daily -Continue zetia '10mg'$  daily -Continue plavix '75mg'$  daily -Continue atenolol '25mg'$  daily -Continue imdur '90mg'$  daily -Continue ranexa '500mg'$  BID; can up-titrate as needed -Patient  quit smoking -Continue diet and exercising efforts as detailed below -If planned for dental procedure or orthopedic procedure, okay to hold plavix given last stent was in 06/2019 and he is currently asymptomatic without anginal symptoms  #HTN: Running low at home with episode of orthostasis and fall as detailed above. -Continue atenolol '25mg'$  daily -Continue imdur '90mg'$  daily -Stop amlodipine for now and monitor blood pressures  #HLD: LDL 2; goal <55.  -Continue lipitor '80mg'$  daily -Continue zetia '10mg'$  daily -Continue repatha  #Tobacco Abuse: -Quit successfully  #Overweight: -Continue diet and lifestyle modifications as below  Exercise recommendations: Goal of exercising for at least 30 minutes a day, at least 5 times per week.  Please exercise to a moderate exertion.  This means that while exercising it is difficult to speak in full sentences, however you are not so short of breath  that you feel you must stop, and not so comfortable that you can carry on a full conversation.  Exertion level should be approximately a 5/10, if 10 is the most exertion you can perform.  Diet recommendations: Recommend a heart healthy diet such as the Mediterranean diet.  This diet consists of plant based foods, healthy fats, lean meats, olive oil.  It suggests limiting the intake of simple carbohydrates such as white breads, pastries, and pastas.  It also limits the amount of red meat, wine, and dairy products such as cheese that one should consume on a daily basis.       Medication Adjustments/Labs and Tests Ordered: Current medicines are reviewed at length with the patient today.  Concerns regarding medicines are outlined above.  No orders of the defined types were placed in this encounter.  Meds ordered this encounter  Medications   ranolazine (RANEXA) 500 MG 12 hr tablet    Sig: Take 1 tablet (500 mg total) by mouth 2 (two) times daily.    Dispense:  180 tablet    Refill:  3   ezetimibe (ZETIA) 10 MG tablet    Sig: Take 1 tablet (10 mg total) by mouth daily.    Dispense:  90 tablet    Refill:  3    11/12/2020 NEW   atenolol (TENORMIN) 25 MG tablet    Sig: Take 1 tablet (25 mg total) by mouth daily.    Dispense:  90 tablet    Refill:  3    Dose decrease    Patient Instructions  Medication Instructions:   STOP TAKING AMLODIPINE NOW  DECREASE YOUR ATENOLOL TO 25 MG BY MOUTH DAILY  *If you need a refill on your cardiac medications before your next appointment, please call your pharmacy*    Follow-Up: At [facility], you and your health needs are our priority.  As part of our continuing mission to provide you with exceptional heart care, we have created designated Provider Care Teams.  These Care Teams include your primary Cardiologist (physician) and Advanced Practice Providers (APPs -  Physician Assistants and Nurse Practitioners) who all work together to provide  you with the care you need, when you need it.  We recommend signing up for the patient portal called "MyChart".  Sign up information is provided on this After Visit Summary.  MyChart is used to connect with patients for Virtual Visits (Telemedicine).  Patients are able to view lab/test results, encounter notes, upcoming appointments, etc.  Non-urgent messages can be sent to your provider as well.   To learn more about what you can do with MyChart, go to [email or web address].  Your next appointment:   6 month(s)  The format for your next appointment:   In Person  Provider:   DR. Johney Frame     Other Instructions  YOU CAN TRY TO CALL [facility] TO SEE IF THEY ARE OFFERING FREE DENTAL CLEANING/WORK TO THE [place]   Important Information About Sugar         Signed, Freada Bergeron, MD  12/22/2021 12:37 PM    [place]

## 2021-12-22 ENCOUNTER — Ambulatory Visit: Payer: Medicare Other | Attending: Cardiology | Admitting: Cardiology

## 2021-12-22 ENCOUNTER — Encounter: Payer: Self-pay | Admitting: Cardiology

## 2021-12-22 VITALS — BP 102/64 | HR 75 | Ht 66.0 in | Wt 185.4 lb

## 2021-12-22 DIAGNOSIS — I2089 Other forms of angina pectoris: Secondary | ICD-10-CM

## 2021-12-22 DIAGNOSIS — I25118 Atherosclerotic heart disease of native coronary artery with other forms of angina pectoris: Secondary | ICD-10-CM | POA: Diagnosis not present

## 2021-12-22 DIAGNOSIS — Z720 Tobacco use: Secondary | ICD-10-CM

## 2021-12-22 DIAGNOSIS — I10 Essential (primary) hypertension: Secondary | ICD-10-CM | POA: Diagnosis not present

## 2021-12-22 DIAGNOSIS — E782 Mixed hyperlipidemia: Secondary | ICD-10-CM | POA: Diagnosis not present

## 2021-12-22 MED ORDER — RANOLAZINE ER 500 MG PO TB12: 500.0000 mg | ORAL_TABLET | Freq: Two times a day (BID) | ORAL | 3 refills | Status: DC

## 2021-12-22 MED ORDER — ATENOLOL 25 MG PO TABS: 25.0000 mg | ORAL_TABLET | Freq: Every day | ORAL | 3 refills | Status: DC

## 2021-12-22 MED ORDER — EZETIMIBE 10 MG PO TABS: 10.0000 mg | ORAL_TABLET | Freq: Every day | ORAL | 3 refills | Status: DC

## 2021-12-22 NOTE — Patient Instructions (Signed)
Medication Instructions:   STOP TAKING AMLODIPINE NOW  DECREASE YOUR ATENOLOL TO 25 MG BY MOUTH DAILY  *If you need a refill on your cardiac medications before your next appointment, please call your pharmacy*    Follow-Up: At [facility], you and your health needs are our priority.  As part of our continuing mission to provide you with exceptional heart care, we have created designated Provider Care Teams.  These Care Teams include your primary Cardiologist (physician) and Advanced Practice Providers (APPs -  Physician Assistants and Nurse Practitioners) who all work together to provide you with the care you need, when you need it.  We recommend signing up for the patient portal called "MyChart".  Sign up information is provided on this After Visit Summary.  MyChart is used to connect with patients for Virtual Visits (Telemedicine).  Patients are able to view lab/test results, encounter notes, upcoming appointments, etc.  Non-urgent messages can be sent to your provider as well.   To learn more about what you can do with MyChart, go to [email or web address].    Your next appointment:   6 month(s)  The format for your next appointment:   In Person  Provider:   DR. Johney Frame     Other Instructions  YOU CAN TRY TO CALL [facility] TO SEE IF THEY ARE OFFERING FREE DENTAL CLEANING/WORK TO THE [place]   Important Information About Sugar

## 2022-01-01 ENCOUNTER — Ambulatory Visit: Payer: Medicare Other | Admitting: Cardiology

## 2022-01-27 ENCOUNTER — Other Ambulatory Visit: Payer: Self-pay

## 2022-01-27 MED ORDER — ISOSORBIDE MONONITRATE ER 60 MG PO TB24: 90.0000 mg | ORAL_TABLET | Freq: Every day | ORAL | 3 refills | Status: DC

## 2022-02-04 ENCOUNTER — Other Ambulatory Visit (HOSPITAL_COMMUNITY): Payer: Self-pay

## 2022-02-04 ENCOUNTER — Telehealth (INDEPENDENT_AMBULATORY_CARE_PROVIDER_SITE_OTHER): Payer: Medicare Other | Admitting: Internal Medicine

## 2022-02-04 DIAGNOSIS — Z01810 Encounter for preprocedural cardiovascular examination: Secondary | ICD-10-CM | POA: Diagnosis not present

## 2022-02-04 NOTE — Telephone Encounter (Signed)
   Pre-operative Risk Assessment    Patient Name: Bill Johnson  DOB: 03-25-1955 MRN: [id number]     Request for Surgical Clearance    Procedure:  Dental Extraction - Amount of Teeth to be Pulled:  15  Date of Surgery:  Clearance 02/04/21                                 Surgeon:  Durene Cal  Surgeon's Group or Practice Name:  Dr [facility]le[facility] Phone number:  [phone] Fax number:      Type of Clearance Requested:   - Medical    Type of Anesthesia:  Local    Additional requests/questions:      SignedMilbert Coulter   02/04/2022, 8:35 AM

## 2022-02-04 NOTE — Telephone Encounter (Signed)
   Patient Name: Bill Johnson  DOB: October 01, 1955 MRN: [id number]  Primary Cardiologist: None  Chart reviewed as part of pre-operative protocol coverage. Pre-op clearance already addressed by colleagues in earlier phone notes. To summarize recommendations:  -The patient previously discussed clearance with Dr. Johney Frame back in November.  In that note it mentions "okay to hold plavix given last stent was in 06/2019 and he is currently asymptomatic without anginal symptoms".   Patient has been holding his Plavix for 4 days.  He can restart his Plavix when okay with his dentist.  I spoke with the patient this morning and he is not having any chest pain or shortness of breath.  He is currently in the chair getting ready for his 15 teeth to be extracted.  He tells me he has no issues walking 1-2 blocks other than some knee pain since he needs a knee replacement.  He has no issues with the flight of stairs.  He can do all of his own household tasks and he mows his own lawn with a push mower.  Asymptomatic with all of these activities.  Given his level of activity, he exceeds the 4 METS minimum requirement for clearance.  He does not have a history of any prosthetic heart valves.  No SBE prophylaxis required for procedure.  Will route this bundled recommendation to requesting provider via Epic fax function and remove from pre-op pool. Please call with questions.  Elgie Collard, PA-C 02/04/2022, 8:49 AM

## 2022-02-17 DIAGNOSIS — G8929 Other chronic pain: Secondary | ICD-10-CM | POA: Diagnosis not present

## 2022-02-17 DIAGNOSIS — M12811 Other specific arthropathies, not elsewhere classified, right shoulder: Secondary | ICD-10-CM | POA: Diagnosis not present

## 2022-02-17 DIAGNOSIS — M25561 Pain in right knee: Secondary | ICD-10-CM | POA: Diagnosis not present

## 2022-02-17 DIAGNOSIS — M12812 Other specific arthropathies, not elsewhere classified, left shoulder: Secondary | ICD-10-CM | POA: Diagnosis not present

## 2022-02-20 ENCOUNTER — Other Ambulatory Visit: Payer: Self-pay

## 2022-02-20 MED ORDER — EZETIMIBE 10 MG PO TABS: 10.0000 mg | ORAL_TABLET | Freq: Every day | ORAL | 3 refills | Status: DC

## 2022-02-20 NOTE — Telephone Encounter (Signed)
Refilled zetia 10 mg qd #90, rf:3

## 2022-02-24 ENCOUNTER — Ambulatory Visit: Payer: 59 | Attending: Cardiology | Admitting: Cardiology

## 2022-02-24 ENCOUNTER — Encounter: Payer: Self-pay | Admitting: Cardiology

## 2022-02-24 VITALS — BP 120/78 | HR 68 | Ht 66.0 in | Wt 178.6 lb

## 2022-02-24 DIAGNOSIS — I2089 Other forms of angina pectoris: Secondary | ICD-10-CM | POA: Diagnosis not present

## 2022-02-24 DIAGNOSIS — E7800 Pure hypercholesterolemia, unspecified: Secondary | ICD-10-CM

## 2022-02-24 DIAGNOSIS — I2510 Atherosclerotic heart disease of native coronary artery without angina pectoris: Secondary | ICD-10-CM

## 2022-02-24 DIAGNOSIS — I25118 Atherosclerotic heart disease of native coronary artery with other forms of angina pectoris: Secondary | ICD-10-CM

## 2022-02-24 DIAGNOSIS — E782 Mixed hyperlipidemia: Secondary | ICD-10-CM | POA: Diagnosis not present

## 2022-02-24 DIAGNOSIS — I10 Essential (primary) hypertension: Secondary | ICD-10-CM

## 2022-02-24 DIAGNOSIS — Z720 Tobacco use: Secondary | ICD-10-CM | POA: Diagnosis not present

## 2022-02-24 MED ORDER — NITROGLYCERIN 0.4 MG SL SUBL: 0.4000 mg | SUBLINGUAL_TABLET | SUBLINGUAL | 3 refills | Status: DC | PRN

## 2022-02-24 MED ORDER — RANOLAZINE ER 1000 MG PO TB12: 1000.0000 mg | ORAL_TABLET | Freq: Two times a day (BID) | ORAL | 2 refills | Status: DC

## 2022-02-24 NOTE — Patient Instructions (Signed)
Medication Instructions:   INCREASE YOUR RANEXA TO 1,000 MG BY MOUTH TWICE DAILY  *If you need a refill on your cardiac medications before your next appointment, please call your pharmacy*    Testing/Procedures:  Your physician has requested that you have an echocardiogram. Echocardiography is a painless test that uses sound waves to create images of your heart. It provides your doctor with information about the size and shape of your heart and how well your heart's chambers and valves are working. This procedure takes approximately one hour. There are no restrictions for this procedure. Please do NOT wear cologne, perfume, aftershave, or lotions (deodorant is allowed). Please arrive 15 minutes prior to your appointment time.    Follow-Up: At [facility], you and your health needs are our priority.  As part of our continuing mission to provide you with exceptional heart care, we have created designated Provider Care Teams.  These Care Teams include your primary Cardiologist (physician) and Advanced Practice Providers (APPs -  Physician Assistants and Nurse Practitioners) who all work together to provide you with the care you need, when you need it.  We recommend signing up for the patient portal called "MyChart".  Sign up information is provided on this After Visit Summary.  MyChart is used to connect with patients for Virtual Visits (Telemedicine).  Patients are able to view lab/test results, encounter notes, upcoming appointments, etc.  Non-urgent messages can be sent to your provider as well.   To learn more about what you can do with MyChart, go to [email or web address].    Your next appointment:   6 month(s)  Provider:   DR. Johney Frame

## 2022-02-24 NOTE — Progress Notes (Signed)
Cardiology Office Note:    Date:  02/24/2022   ID:  Bill Johnson, DOB March 26, 1955, MRN [id number]  PCP:  Garnet Sierras, NP   [facility] Providers Cardiologist:  None {   Referring MD: Garnet Sierras, NP    History of Present Illness:    Bill Johnson is a 67 y.o. male with a hx of HTN, GERD with esophagitis, COPD, OA, cervical spondylosis, anxiety, tobacco and alcohol abuse, CAD s/p DESx2 to o-mLAD 07/05/19 for CP and +CCTA, then DES to 95% OM1 stenosis 07/20/19 for residual symptoms with recent cath for chest pain with stable disease who now presents to clinic for follow-up.  Patient admitted from 04/23/20-04/25/20 for chest pain. Cath done on that admission showed widely patent previously placed stents in mid-LAD (mild ISR), prox-to-mid LAD, and OM1 with no new lesions. He was then discharged on medical management. Of note, during previous admission for chest pain it was thought that his jailed diagonal may be the source of his discomfort.  Saw me in clinic on 12/30/20 as urgent visit where he was having significant chest pain with minimal exertion and decreased exercise capacity. We referred for repeat cath on 01/02/2021 patent LAD and OM stents, moderate D1 disease, moderate PDA disease which was overall stable. Recommended for continued medical management.  Was seen in clinic on  01/2021 where he was doing well. Was still having SOB so we referred to [facility] who recommended prn symbicort.  Was last seen 11/2021 where he was stable from a CV standpoint. Was working on finding a Pharmacist, community for teeth removal. Was okay to come off plavix for the procedure.   Today, the patient states that he was laying in bed about 1 week ago when his dog barked and startled hm. Following the event, he developed chest pain under the left breast. Since that time, he has been having intermittent pain in that region. No known triggers of the pain and symptoms did not improve with nitro (although his  prescription is expired). No pain with palpitation of the region. No SOB at rest but has dyspnea on exertion which he attributes to being deconditioned and this has not worsened. No nausea, diaphoresis. No LE edema, orthopnea or PND. He stopped his atenolol as his blood pressure has been good. Prior to this event, he was feeling well with no symptoms.    Past Medical History:  Diagnosis Date   Alcohol abuse    Allergy    seasonal   Anxiety    CAD (coronary artery disease)    a. 06/2019 cath - s/p successful PCI of the ostial to mid LAD with IVUS guidance and DES x 2.   Depression    Essential hypertension    GERD (gastroesophageal reflux disease)    HOH (hard of hearing)    Hyperlipidemia    Hypertension    OA (osteoarthritis) of knee    Pulmonary nodules    a. seen on coronary CT 05/2019, will need OP f/u.   Rotator cuff tear arthropathy of both shoulders    On DISABILITY, non operative case   Shoulder pain    Tobacco abuse    Vertigo     Past Surgical History:  Procedure Laterality Date   CARDIAC CATHETERIZATION  06/2005   no significant CAD   COLONOSCOPY WITH PROPOFOL N/A 05/01/2019   Procedure: COLONOSCOPY WITH PROPOFOL;  Surgeon: Daneil Dolin, MD;  [facility];  Service: Endoscopy;  Laterality: N/A;  7:30am   COLONOSCOPY  WITH PROPOFOL N/A 03/07/2020   Procedure: COLONOSCOPY WITH PROPOFOL;  Surgeon: Daneil Dolin, MD;  [facility];  Service: Endoscopy;  Laterality: N/A;  8:30am   CORONARY STENT INTERVENTION N/A 07/05/2019   Procedure: CORONARY STENT INTERVENTION;  Surgeon: Martinique, Peter M, MD;  Location: [place] [facility];  Service: Cardiovascular;  Laterality: N/A;   CORONARY STENT INTERVENTION N/A 07/20/2019   Procedure: CORONARY STENT INTERVENTION;  Surgeon: Jettie Booze, MD;  Location: [place] [facility];  Service: Cardiovascular;  Laterality: N/A;   ESOPHAGOGASTRODUODENOSCOPY  08/2005   Dr. Henrene Pastor: small hiatal hernia   HERNIA REPAIR  1983    INTRAVASCULAR PRESSURE WIRE/FFR STUDY N/A 07/05/2019   Procedure: INTRAVASCULAR PRESSURE WIRE/FFR STUDY;  Surgeon: Martinique, Peter M, MD;  Location: [place] [facility];  Service: Cardiovascular;  Laterality: N/A;   INTRAVASCULAR ULTRASOUND/IVUS N/A 07/05/2019   Procedure: Intravascular Ultrasound/IVUS;  Surgeon: Martinique, Peter M, MD;  Location: [place] [facility];  Service: Cardiovascular;  Laterality: N/A;   LEFT HEART CATH AND CORONARY ANGIOGRAPHY N/A 07/05/2019   Procedure: LEFT HEART CATH AND CORONARY ANGIOGRAPHY;  Surgeon: Martinique, Peter M, MD;  Location: [place] [facility];  Service: Cardiovascular;  Laterality: N/A;   LEFT HEART CATH AND CORONARY ANGIOGRAPHY N/A 07/20/2019   Procedure: LEFT HEART CATH AND CORONARY ANGIOGRAPHY;  Surgeon: Jettie Booze, MD;  Location: [place] [facility];  Service: Cardiovascular;  Laterality: N/A;   LEFT HEART CATH AND CORONARY ANGIOGRAPHY N/A 04/24/2020   Procedure: LEFT HEART CATH AND CORONARY ANGIOGRAPHY;  Surgeon: Belva Crome, MD;  Location: [place] [facility];  Service: Cardiovascular;  Laterality: N/A;   LEFT HEART CATH AND CORONARY ANGIOGRAPHY N/A 01/02/2021   Procedure: LEFT HEART CATH AND CORONARY ANGIOGRAPHY;  Surgeon: Burnell Blanks, MD;  Location: [place] [facility];  Service: Cardiovascular;  Laterality: N/A;   POLYPECTOMY  05/01/2019   Procedure: POLYPECTOMY;  Surgeon: Daneil Dolin, MD;  [facility];  Service: Endoscopy;;   POLYPECTOMY  03/07/2020   Procedure: POLYPECTOMY;  Surgeon: Daneil Dolin, MD;  [facility];  Service: Endoscopy;;   ROTATOR CUFF REPAIR Bilateral    TONSILLECTOMY  1977    Current Medications: Current Meds  Medication Sig   acetaminophen (TYLENOL) 500 MG tablet Take 1,000 mg by mouth every 6 (six) hours as needed for mild pain or headache.   albuterol (VENTOLIN HFA) 108 (90 Base) MCG/ACT inhaler Inhale 2 puffs into the lungs every 6 (six) hours as needed for wheezing or shortness of  breath.   aspirin 81 MG tablet Take 81 mg by mouth daily.   atenolol (TENORMIN) 25 MG tablet Take 1 tablet (25 mg total) by mouth daily.   atorvastatin (LIPITOR) 80 MG tablet Take 1 tablet (80 mg total) by mouth every evening.   cetirizine (ZYRTEC) 10 MG tablet Take 10 mg by mouth daily.   Cholecalciferol (VITAMIN D3) 50 MCG (2000 UT) CAPS Take 2,000 Units by mouth daily.   clopidogrel (PLAVIX) 75 MG tablet Take 1 tablet (75 mg total) by mouth daily.   CVS VITAMIN B-12 2000 MCG TBCR Take 2,000 mcg by mouth daily.   diazepam (VALIUM) 5 MG tablet Take 1 tablet (5 mg total) by mouth every 12 (twelve) hours as needed for anxiety.   Evolocumab (REPATHA SURECLICK) 712 MG/ML SOAJ Inject 140 mg into the skin every 14 (fourteen) days.   ezetimibe (ZETIA) 10 MG tablet Take 1 tablet (10 mg total) by mouth  daily.   isosorbide mononitrate (IMDUR) 60 MG 24 hr tablet Take 1.5 tablets (90 mg total) by mouth daily.   meclizine (ANTIVERT) 25 MG tablet TAKE 1 TABLET BY MOUTH THREE TIMES DAILY AS NEEDED FOR DIZZINESS   pantoprazole (PROTONIX) 40 MG tablet Take 1 tablet (40 mg total) by mouth 2 (two) times daily.   ranolazine (RANEXA) 1000 MG SR tablet Take 1 tablet (1,000 mg total) by mouth 2 (two) times daily.   [DISCONTINUED] ranolazine (RANEXA) 500 MG 12 hr tablet Take 1 tablet (500 mg total) by mouth 2 (two) times daily.     Allergies:   Patient has no known allergies.   Social History   Socioeconomic History   Marital status: Divorced    Spouse name: Not on file   Number of children: 2   Years of education: 12   Highest education level: Not on file  Occupational History   Occupation: Self-employed Designer, television/film set)  Tobacco Use   Smoking status: Former    Packs/day: 1.00    Years: 43.00    Total pack years: 43.00    Types: Cigarettes    Start date: 04/15/1970    Quit date: 07/26/2020    Years since quitting: 1.5   Smokeless tobacco: Never  Vaping Use   Vaping Use: Never used  Substance and Sexual  Activity   Alcohol use: Not Currently    Comment: Drinks 3-4 beers/day    Drug use: No   Sexual activity: Yes  Other Topics Concern   Not on file  Social History Narrative   Lives at home with nephew and niece in-law   Caffeine use: Drinks coffee/soda every day   Social Determinants of Radio broadcast assistant Strain: Not on file  Food Insecurity: Not on file  Transportation Needs: Not on file  Physical Activity: Not on file  Stress: Not on file  Social Connections: Not on file     Family History: The patient's family history includes Cancer in his brother and another family member; Diabetes in his mother; Heart disease in his father and mother; Hypertension in his father and mother; Stroke in an other family member. There is no history of Colon cancer.  ROS:   Please see the history of present illness.    Review of Systems  Constitutional:  Negative for malaise/fatigue.  Respiratory:  Positive for shortness of breath.   Cardiovascular:  Positive for chest pain. Negative for palpitations, orthopnea, claudication, leg swelling and PND.  Gastrointestinal:  Negative for blood in stool and melena.  Genitourinary:  Negative for hematuria.  Musculoskeletal:  Positive for back pain and joint pain.  Neurological:  Negative for dizziness and loss of consciousness.     EKGs/Labs/Other Studies Reviewed:    The following studies were reviewed today: ECG 03/11/22: NSR with HR 68  01/02/21:   2nd Diag lesion is 60% stenosed.   RPDA lesion is 60% stenosed.   RV Branch lesion is 10% stenosed.   1st Mrg lesion is 10% stenosed.   Mid LAD lesion is 30% stenosed.   The left ventricular systolic function is normal.   LV end diastolic pressure is normal.   The left ventricular ejection fraction is 55-65% by visual estimate.   There is no mitral valve regurgitation.   Patent mid LAD stents without significant restenosis. Moderate ostial Diagonal branch stenosis, jailed by LAD stent  with normal flow.  Patent obtuse marginal branch stent without restenosis. This vessel arises from what may be a large  intermediate branch. 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 The proximal segment of the PDA has a 50-60% stenosis that is unchanged from prior cardiac caths.  Normal LV systolic function.  Mild elevation LV filling pressures, LVEDP 17 mmHg.    Recommendations: Continue medical management of CAD. I suspect that his dyspnea is secondary to deconditioning but cannot exclude microvascular disease.    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  The vessel is very small in size and contains luminal irregularities. The right coronary is dominant.  The PDA arises from the distal portion of the mid vessel.  The origin of the PDA contains a 50 to 60% eccentric narrowing. Left main is widely patent. The left main gives origin to the LAD and what should be considered a ramus intermedius that then bifurcates.  The superior most branch of the ramus bifurcation contains a patent stent. The LAD is a large vessel and contains an ostial to mid vessel stent the jails a diagonal distally.  The diagonal contains ostial 50 to 70% narrowing. Normal left ventricular function.  LVEF 55%.   RECOMMENDATIONS:   The previously placed stents are widely patent. Ongoing chest pain without enzyme abnormality or EKG changes suggests the possibility of an alternative explanation. Per treating team   Diagnostic Dominance: Right           Echocardiogram: 02/2019 IMPRESSIONS   1. Left ventricular ejection fraction, by estimation, is 55 to 60%. The  left ventricle has normal function. The left ventricle has no regional  wall motion abnormalities. Left ventricular  diastolic parameters were  normal.   2. Right ventricular systolic function is normal. The right ventricular  size is normal. Tricuspid regurgitation signal is inadequate for assessing  PA pressure.   3. Left atrial size was upper normal.   4. Right atrial size was upper normal.   5. The mitral valve is grossly normal. Trivial mitral valve  regurgitation.   6. The aortic valve is tricuspid. Aortic valve regurgitation is mild.   7. The inferior vena cava is normal in size with greater than 50%  respiratory variability, suggesting right atrial pressure of 3 mmHg   Cardiac catheterization 07/05/2019   Prox LAD to Mid LAD lesion is 70% stenosed. A drug-eluting stent was successfully placed using a STENT RESOLUTE ONYX 3.0X22. Post intervention, there is a 0% residual stenosis. Mid LAD lesion is 50% stenosed. A drug-eluting stent was successfully placed using a STENT RESOLUTE ONYX 2.5X30. Post intervention, there is a 0% residual stenosis. 1st Mrg lesion is 95% stenosed. The left ventricular systolic function is normal. LV end diastolic pressure is normal. The left ventricular ejection fraction is 55-65% by visual estimate.   1. Severe 2 vessel obstructive CAD. Abnormal FFR of the LAD. 2. Normal LV function 3. Normal LVEDP 4. Successful PCI of the ostial to mid LAD with IVUS guidance and DES x 2.    Plan: DAPT for one year. Optimize medical therapy. Smoking cessation. If patient continues to have symptoms despite optimal therapy could consider PCI of the OM but this is a small branch and supplies a relatively small area of myocardium.   Diagnostic Dominance: Right   Intervention         Cardiac catheterization  07/20/2019   Previously placed Mid LAD drug eluting stent is widely patent. Previously placed Prox LAD to Mid LAD drug eluting stent is widely patent. 1st Mrg lesion is 95% stenosed. A drug-eluting stent was successfully placed using a STENT RESOLUTE ONYX 2.25X26,  postdilated to 2.6 mm. Post intervention, there is a 0% residual stenosis. The left ventricular systolic function is normal. LV end diastolic pressure is normal. The left ventricular ejection fraction is 55-65% by visual estimate. There is no aortic valve stenosis.   Continue aggressive secondary prevention.  Consider clopidogrel monotherapy after 12 months.    Watch overnight.  Home tomorrow.    He is interested in getting the COVID-19 vaccine.  I encouraged him to get the vaccines when possible.   Diagnostic Dominance: Right Intervention         Recent Labs: 05/05/2021: ALT 15  Recent Lipid Panel    Component Value Date/Time   CHOL 59 (L) 05/05/2021 1007   TRIG 51 05/05/2021 1007   HDL 43 05/05/2021 1007   CHOLHDL 1.4 05/05/2021 1007   CHOLHDL 3.2 04/24/2020 0400   VLDL 41 (H) 04/24/2020 0400   LDLCALC 2 05/05/2021 1007   LDLCALC 71 09/01/2019 0859           Physical Exam:    VS:  BP 120/78   Pulse 68   Ht '5\' 6"'$  (1.676 m)   Wt 178 lb 9.6 oz (81 kg)   SpO2 95%   BMI 28.83 kg/m     Wt Readings from Last 3 Encounters:  02/24/22 178 lb 9.6 oz (81 kg)  12/22/21 185 lb 6.4 oz (84.1 kg)  06/24/21 169 lb (76.7 kg)     GEN:  Comfortable, NAD HEENT: Normal NECK: No JVD; No carotid bruits CARDIAC: RRR, no murmurs RESPIRATORY:  Clear bilaterally with no wheezing ABDOMEN: Soft, non-tender, non-distended MUSCULOSKELETAL:  Right knee brace in place. No edema SKIN: Warm and dry NEUROLOGIC:  Alert and oriented x 3 PSYCHIATRIC:  Normal affect   ASSESSMENT:    1. Coronary artery disease of native artery of native heart with stable angina pectoris ([place])   2. Mixed hyperlipidemia   3. CAD in native artery   4. Tobacco abuse   5. Essential hypertension   6. Stable angina pectoris   7. Hypercholesterolemia       PLAN:    In order of problems listed above:  #Multivessel CAD s/p PCI to LAD and OM: Last cath on 12/2020 for chest pain with patent LAD and OM  stents, 60% D2, 30% mLAD, 60% RPDA which was overall stable from prior. Recommended for continued medical management. Currently with intermittent chest pain after being scared by his dog. Symptoms do not seem exertional in nature and may be related to straining his chest wall after being startled. Could also have some residual angina from jailed diagonal branch +/- microvascular disease. Discussed this at length with him today. Will increase ranexa and monitor response. Will also check TTE for monitoring. Given stable disease on cath 12/2020, will not pursue ischemic work-up unless symptoms persist.  -Check TTE -Continue ASA '81mg'$  daily -Continue lipitor '80mg'$  daily -Continue zetia '10mg'$  daily -Continue plavix '75mg'$  daily -Continue imdur '90mg'$  daily -Increase ranexa to '1000mg'$  BID -Patient quit smoking -Continue diet and exercising efforts as detailed below -Will refill nitro for chest pain -If symptoms do not improve, can pursue ischemic testing at that time  #HTN: Well controlled. Off atenolol and amlodipine due to orthostasis -Continue imdur '90mg'$  daily  #  HLD: LDL 2; goal <55.  -Continue lipitor '80mg'$  daily -Continue zetia '10mg'$  daily -Continue repatha  #Tobacco Abuse: -Quit successfully  #Overweight: -Continue diet and lifestyle modifications as below  Exercise recommendations: Goal of exercising for at least 30 minutes a day, at least 5 times per week.  Please exercise to a moderate exertion.  This means that while exercising it is difficult to speak in full sentences, however you are not so short of breath that you feel you must stop, and not so comfortable that you can carry on a full conversation.  Exertion level should be approximately a 5/10, if 10 is the most exertion you can perform.  Diet recommendations: Recommend a heart healthy diet such as the Mediterranean diet.  This diet consists of plant based foods, healthy fats, lean meats, olive oil.  It suggests limiting the intake of  simple carbohydrates such as white breads, pastries, and pastas.  It also limits the amount of red meat, wine, and dairy products such as cheese that one should consume on a daily basis.       Medication Adjustments/Labs and Tests Ordered: Current medicines are reviewed at length with the patient today.  Concerns regarding medicines are outlined above.  Orders Placed This Encounter  Procedures   EKG 12-Lead   ECHOCARDIOGRAM COMPLETE   Meds ordered this encounter  Medications   ranolazine (RANEXA) 1000 MG SR tablet    Sig: Take 1 tablet (1,000 mg total) by mouth 2 (two) times daily.    Dispense:  180 tablet    Refill:  2    DOSE INCREASE   nitroGLYCERIN (NITROSTAT) 0.4 MG SL tablet    Sig: Place 1 tablet (0.4 mg total) under the tongue every 5 (five) minutes as needed for chest pain (up to 3 doses).    Dispense:  25 tablet    Refill:  3    Patient Instructions  Medication Instructions:   INCREASE YOUR RANEXA TO 1,000 MG BY MOUTH TWICE DAILY  *If you need a refill on your cardiac medications before your next appointment, please call your pharmacy*    Testing/Procedures:  Your physician has requested that you have an echocardiogram. Echocardiography is a painless test that uses sound waves to create images of your heart. It provides your doctor with information about the size and shape of your heart and how well your heart's chambers and valves are working. This procedure takes approximately one hour. There are no restrictions for this procedure. Please do NOT wear cologne, perfume, aftershave, or lotions (deodorant is allowed). Please arrive 15 minutes prior to your appointment time.    Follow-Up: At [facility], you and your health needs are our priority.  As part of our continuing mission to provide you with exceptional heart care, we have created designated Provider Care Teams.  These Care Teams include your primary Cardiologist (physician) and Advanced Practice  Providers (APPs -  Physician Assistants and Nurse Practitioners) who all work together to provide you with the care you need, when you need it.  We recommend signing up for the patient portal called "MyChart".  Sign up information is provided on this After Visit Summary.  MyChart is used to connect with patients for Virtual Visits (Telemedicine).  Patients are able to view lab/test results, encounter notes, upcoming appointments, etc.  Non-urgent messages can be sent to your provider as well.   To learn more about what you can do with MyChart, go to [email or web address].    Your next appointment:  6 month(s)  Provider:   DR. Johney Frame      Signed, Freada Bergeron, MD  02/24/2022 3:05 PM    [facility]

## 2022-03-11 ENCOUNTER — Ambulatory Visit (HOSPITAL_COMMUNITY)
Admission: RE | Admit: 2022-03-11 | Discharge: 2022-03-11 | Disposition: A | Discharge status: Home / Self Care | Payer: 59 | Source: Ambulatory Visit | Attending: Cardiology | Admitting: Cardiology

## 2022-03-11 DIAGNOSIS — I25118 Atherosclerotic heart disease of native coronary artery with other forms of angina pectoris: Secondary | ICD-10-CM | POA: Diagnosis not present

## 2022-03-11 DIAGNOSIS — I2510 Atherosclerotic heart disease of native coronary artery without angina pectoris: Secondary | ICD-10-CM

## 2022-03-11 DIAGNOSIS — E782 Mixed hyperlipidemia: Secondary | ICD-10-CM | POA: Diagnosis not present

## 2022-03-11 LAB — ECHOCARDIOGRAM COMPLETE
Area-P 1/2: 2.87 cm2
P 1/2 time: 703 msec
S' Lateral: 2.6 cm

## 2022-03-11 NOTE — Progress Notes (Signed)
*  PRELIMINARY RESULTS* Echocardiogram 2D Echocardiogram has been performed.  Bill Johnson 03/11/2022, 1:28 PM

## 2022-03-16 DIAGNOSIS — M25561 Pain in right knee: Secondary | ICD-10-CM | POA: Diagnosis not present

## 2022-03-17 ENCOUNTER — Other Ambulatory Visit: Payer: Self-pay | Admitting: Pharmacist

## 2022-03-17 MED ORDER — REPATHA SURECLICK 140 MG/ML ~~LOC~~ SOAJ: 140.0000 mg | SUBCUTANEOUS | 3 refills | Status: DC

## 2022-03-18 ENCOUNTER — Telehealth: Payer: Self-pay | Admitting: *Deleted

## 2022-03-18 NOTE — Telephone Encounter (Signed)
  Patient Consent for Virtual Visit         Bill Johnson has provided verbal consent on 03/18/2022 for a virtual visit (video or telephone).   CONSENT FOR VIRTUAL VISIT FOR:  Bill Johnson  By participating in this virtual visit I agree to the following:  I hereby voluntarily request, consent and authorize [facility] and its employed or contracted physicians, physician assistants, nurse practitioners or other licensed health care professionals (the Practitioner), to provide me with telemedicine health care services (the "Services") as deemed necessary by the treating Practitioner. I acknowledge and consent to receive the Services by the Practitioner via telemedicine. I understand that the telemedicine visit will involve communicating with the Practitioner through live audiovisual communication technology and the disclosure of certain medical information by electronic transmission. I acknowledge that I have been given the opportunity to request an in-person assessment or other available alternative prior to the telemedicine visit and am voluntarily participating in the telemedicine visit.  I understand that I have the right to withhold or withdraw my consent to the use of telemedicine in the course of my care at any time, without affecting my right to future care or treatment, and that the Practitioner or I may terminate the telemedicine visit at any time. I understand that I have the right to inspect all information obtained and/or recorded in the course of the telemedicine visit and may receive copies of available information for a reasonable fee.  I understand that some of the potential risks of receiving the Services via telemedicine include:  Delay or interruption in medical evaluation due to technological equipment failure or disruption; Information transmitted may not be sufficient (e.g. poor resolution of images) to allow for appropriate medical decision making by the  Practitioner; and/or  In rare instances, security protocols could fail, causing a breach of personal health information.  Furthermore, I acknowledge that it is my responsibility to provide information about my medical history, conditions and care that is complete and accurate to the best of my ability. I acknowledge that Practitioner's advice, recommendations, and/or decision may be based on factors not within their control, such as incomplete or inaccurate data provided by me or distortions of diagnostic images or specimens that may result from electronic transmissions. I understand that the practice of medicine is not an exact science and that Practitioner makes no warranties or guarantees regarding treatment outcomes. I acknowledge that a copy of this consent can be made available to me via my patient portal ([facility]), or I can request a printed copy by calling the office of [facility].    I understand that my insurance will be billed for this visit.   I have read or had this consent read to me. I understand the contents of this consent, which adequately explains the benefits and risks of the Services being provided via telemedicine.  I have been provided ample opportunity to ask questions regarding this consent and the Services and have had my questions answered to my satisfaction. I give my informed consent for the services to be provided through the use of telemedicine in my medical care

## 2022-03-18 NOTE — Telephone Encounter (Signed)
   Pre-operative Risk Assessment    Patient Name: Bill Johnson  DOB: [birth year] MRN: [id number]      Request for Surgical Clearance    Procedure:   Right Total Knee Arthroplasty  Date of Surgery:  Clearance TBD                                 Surgeon:  Dr. Rod Can Surgeon's Group or Practice Name:  [facility] Phone number:  [phone] Fax number:  [phone]   Type of Clearance Requested:   - Medical  - Pharmacy:  Hold Aspirin and Clopidogrel (Plavix) Not Indicated   Type of Anesthesia:  Spinal   Additional requests/questions:    Signed, Greer Ee   03/18/2022, 10:46 AM

## 2022-03-18 NOTE — Telephone Encounter (Signed)
   Name: Bill Johnson  DOB: 06-24-1955  MRN: [id number]  Primary Cardiologist: None   Preoperative team, please contact this patient and set up a phone call appointment for further preoperative risk assessment. Please obtain consent and complete medication review. Thank you for your help.  I confirm that guidance regarding antiplatelet and oral anticoagulation therapy has been completed and, if necessary, noted below.  Per office protocol, he may hold Plavix for 5 days prior to procedure and should resume as soon as hemodynamically stable postoperatively.  Ideally aspirin should be continued without interruption, however if the bleeding risk is too great, aspirin may be held for 7 days prior to surgery. Please resume aspirin post operatively when it is felt to be safe from a bleeding standpoint.    Mayra Reel, NP 03/18/2022, 11:04 AM [place]

## 2022-03-19 DIAGNOSIS — J449 Chronic obstructive pulmonary disease, unspecified: Secondary | ICD-10-CM | POA: Diagnosis not present

## 2022-03-19 DIAGNOSIS — Z01818 Encounter for other preprocedural examination: Secondary | ICD-10-CM | POA: Diagnosis not present

## 2022-03-19 DIAGNOSIS — R42 Dizziness and giddiness: Secondary | ICD-10-CM | POA: Diagnosis not present

## 2022-03-19 DIAGNOSIS — G8929 Other chronic pain: Secondary | ICD-10-CM | POA: Diagnosis not present

## 2022-03-19 DIAGNOSIS — Z955 Presence of coronary angioplasty implant and graft: Secondary | ICD-10-CM | POA: Diagnosis not present

## 2022-03-19 DIAGNOSIS — I10 Essential (primary) hypertension: Secondary | ICD-10-CM | POA: Diagnosis not present

## 2022-03-19 DIAGNOSIS — M25561 Pain in right knee: Secondary | ICD-10-CM | POA: Diagnosis not present

## 2022-03-19 DIAGNOSIS — I700 Atherosclerosis of aorta: Secondary | ICD-10-CM | POA: Diagnosis not present

## 2022-03-19 DIAGNOSIS — G4700 Insomnia, unspecified: Secondary | ICD-10-CM | POA: Diagnosis not present

## 2022-03-19 DIAGNOSIS — R7301 Impaired fasting glucose: Secondary | ICD-10-CM | POA: Diagnosis not present

## 2022-03-19 DIAGNOSIS — E782 Mixed hyperlipidemia: Secondary | ICD-10-CM | POA: Diagnosis not present

## 2022-03-24 ENCOUNTER — Ambulatory Visit: Payer: 59

## 2022-03-24 ENCOUNTER — Encounter: Payer: Self-pay | Admitting: Physician Assistant

## 2022-03-24 NOTE — Telephone Encounter (Signed)
Pt has been scheduled to see Ambrose Pancoast, NP 03/31/22 per Melina Copa, PAC pre op APP today, pt needs in office appt and not tele appt. See notes.

## 2022-03-24 NOTE — Telephone Encounter (Signed)
DPR on file ok to s/w the pt's brother Liliane Channel. I left a message per pre op APP Melina Copa, PAC, to cancel the tele appt for today and schedule in office appt with Dr. Johney Frame or APP for pre op clearance. I will update the requesting office as well pt will need in office appt.

## 2022-03-24 NOTE — Telephone Encounter (Addendum)
Covering preop today, patient on virtual schedule for this afternoon. Patient was recently seen in the office reporting intermittent chest pain as well as dyspnea on exertion. 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 His Ranexa was increased, nitroglycerin was refilled, with plan to continue medical therapy with recommendation that if symptoms did not improve would pursue ischemic testing. Since visit was <1 month ago typically would not qualify for virtual visit, typically would be phone call only if doing well at last visit. However, per protocol, given symptoms at last OV, requires in-office visit to reassess prior to surgery. Please cancel virtual visit and reschedule to in-office.  Charlie Pitter, PA-C

## 2022-03-24 NOTE — Progress Notes (Signed)
   Covering preop today, patient on virtual schedule for this afternoon. Patient was recently seen in the office reporting intermittent chest pain as well as dyspnea on exertion. 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 His Ranexa was increased, nitroglycerin was refilled, with recommendation for trial of increased medical therapy and that if symptoms did not improve would pursue ischemic testing. Since visit was <1 month ago typically would not qualify for virtual visit, typically would be phone call only if doing well at last visit. However, per protocol, given symptoms at last OV, requires in-office visit to reassess prior to surgery. I will route this request in original phone note to cancel virtual visit and reschedule to in-office.  Charlie Pitter, PA-C

## 2022-03-30 NOTE — Progress Notes (Unsigned)
Office Visit    Patient Name: Bill Johnson Date of Encounter: 03/30/2022  Primary Care Provider:  Garnet Sierras, NP Primary Cardiologist:  None Primary Electrophysiologist: None  Chief Complaint    Bill Johnson is a 67 y.o. male with PMH of CAD s/p DES x 2 to LAD 06/2019 with residual 95% OM1 treated with DES x 1, HLD, HTN, tobacco abuse, prior EtOH abuse who presents today for preoperative clearance.  Past Medical History    Past Medical History:  Diagnosis Date   Alcohol abuse    Allergy    seasonal   Anxiety    CAD (coronary artery disease)    a. 06/2019 cath - s/p successful PCI of the ostial to mid LAD with IVUS guidance and DES x 2.   Depression    Essential hypertension    GERD (gastroesophageal reflux disease)    HOH (hard of hearing)    Hyperlipidemia    Hypertension    OA (osteoarthritis) of knee    Pulmonary nodules    a. seen on coronary CT 05/2019, will need OP f/u.   Rotator cuff tear arthropathy of both shoulders    On DISABILITY, non operative case   Shoulder pain    Tobacco abuse    Vertigo    Past Surgical History:  Procedure Laterality Date   CARDIAC CATHETERIZATION  06/2005   no significant CAD   COLONOSCOPY WITH PROPOFOL N/A 05/01/2019   Procedure: COLONOSCOPY WITH PROPOFOL;  Surgeon: Daneil Dolin, MD;  [facility];  Service: Endoscopy;  Laterality: N/A;  7:30am   COLONOSCOPY WITH PROPOFOL N/A 03/07/2020   Procedure: COLONOSCOPY WITH PROPOFOL;  Surgeon: Daneil Dolin, MD;  [facility];  Service: Endoscopy;  Laterality: N/A;  8:30am   CORONARY STENT INTERVENTION N/A 07/05/2019   Procedure: CORONARY STENT INTERVENTION;  Surgeon: Martinique, Peter M, MD;  Location: [place] [facility];  Service: Cardiovascular;  Laterality: N/A;   CORONARY STENT INTERVENTION N/A 07/20/2019   Procedure: CORONARY STENT INTERVENTION;  Surgeon: Jettie Booze, MD;  Location: [place] [facility];  Service: Cardiovascular;  Laterality:  N/A;   ESOPHAGOGASTRODUODENOSCOPY  08/2005   Dr. Henrene Pastor: small hiatal hernia   HERNIA REPAIR  1983   INTRAVASCULAR PRESSURE WIRE/FFR STUDY N/A 07/05/2019   Procedure: INTRAVASCULAR PRESSURE WIRE/FFR STUDY;  Surgeon: Martinique, Peter M, MD;  Location: [place] [facility];  Service: Cardiovascular;  Laterality: N/A;   INTRAVASCULAR ULTRASOUND/IVUS N/A 07/05/2019   Procedure: Intravascular Ultrasound/IVUS;  Surgeon: Martinique, Peter M, MD;  Location: [place] [facility];  Service: Cardiovascular;  Laterality: N/A;   LEFT HEART CATH AND CORONARY ANGIOGRAPHY N/A 07/05/2019   Procedure: LEFT HEART CATH AND CORONARY ANGIOGRAPHY;  Surgeon: Martinique, Peter M, MD;  Location: [place] [facility];  Service: Cardiovascular;  Laterality: N/A;   LEFT HEART CATH AND CORONARY ANGIOGRAPHY N/A 07/20/2019   Procedure: LEFT HEART CATH AND CORONARY ANGIOGRAPHY;  Surgeon: Jettie Booze, MD;  Location: [place] [facility];  Service: Cardiovascular;  Laterality: N/A;   LEFT HEART CATH AND CORONARY ANGIOGRAPHY N/A 04/24/2020   Procedure: LEFT HEART CATH AND CORONARY ANGIOGRAPHY;  Surgeon: Belva Crome, MD;  Location: [place] [facility];  Service: Cardiovascular;  Laterality: N/A;   LEFT HEART CATH AND CORONARY ANGIOGRAPHY N/A 01/02/2021   Procedure: LEFT HEART CATH AND CORONARY ANGIOGRAPHY;  Surgeon: Burnell Blanks, MD;  Location: [place] [facility];  Service: Cardiovascular;  Laterality: N/A;   POLYPECTOMY  05/01/2019   Procedure: POLYPECTOMY;  Surgeon: Daneil Dolin, MD;  [facility];  Service: Endoscopy;;   POLYPECTOMY  03/07/2020   Procedure: POLYPECTOMY;  Surgeon: Daneil Dolin, MD;  [facility];  Service: Endoscopy;;   ROTATOR CUFF REPAIR Bilateral    TONSILLECTOMY  1977    Allergies  No Known Allergies  History of Present Illness    Bill Johnson  is a 67 year old male with the above mention past medical history who presents today for preoperative clearance.  Bill Johnson was  initially seen in 2021 for complaint of chest pain.  He was evaluated in the emergency room on 04/2019 due to left-sided chest pain. 2D echo was completed with regional wall motion EF of 55 to 60%, mild aortic regurg.  Cardiac CTA completed 05/2019 showed CT FFR demonstrating a possible hemodynamic flow-limiting lesion in the mid LAD patient was set up for diagnostic cath that revealed severe two-vessel obstructive CAD treated with PCI to ostial mid LAD with DES x 2.  He was started on DAPT with ASA and Plavix along with GDMT.  He presented to [facility] on 07/19/2019 with complaint of chest pain.  Patient's symptoms improved with sublingual nitroglycerin and rated 2 out of 10 chest discomfort.  Troponins were negative and EKG showed sinus bradycardia with no acute ST abnormalities.  He was started on Imdur 30 mg and amlodipine.  He was transferred to [facility] and underwent LHC which demonstrated residual stenosis in the OM and medical management was recommended.  He presented to the ED at any pain 03/2020 with unstable angina.  Troponins were normal and patient was transferred to [facility] for further evaluation.  He was seen by Dr. Johney Frame on 12/30/2020 with complaint of significant chest pain.  He was sent for repeat cath that revealed patent LAD and OM stent with moderate PDA disease and recommendation of continued medical management.  During most recent visit with Dr. Johney Frame on 02/24/2022 and reported chest pain on the left breast.  Ranexa was increased and 2D echo was ordered with plan to not pursue ischemic workup unless symptoms persist.  2D echo showed normal pump function with EF of 55-60% with trivial MVR and mild AVR.  Bill Johnson presents today for preoperative clearance of upcoming knee replacement.  Since last being seen in the office patient reports that he is doing well with no new cardiac complaints since his previous visit.  His blood pressure today is well-controlled at 108/64. He is  tolerating his current medications and denies any adverse reactions.  He recently had 2D echo updated that showed normal heart pumping function.  He also was started on increased dose of Ranexa and is currently tolerating 1000 mg twice daily.  Patient denies chest pain, palpitations, dyspnea, PND, orthopnea, nausea, vomiting, dizziness, syncope, edema, weight gain, or early satiety.   Home Medications    Current Outpatient Medications  Medication Sig Dispense Refill   acetaminophen (TYLENOL) 500 MG tablet Take 1,000 mg by mouth every 6 (six) hours as needed for mild pain or headache.     albuterol (VENTOLIN HFA) 108 (90 Base) MCG/ACT inhaler Inhale 2 puffs into the lungs every 6 (six) hours as needed for wheezing or shortness of breath. 1 each 2   aspirin 81 MG tablet Take 81 mg by mouth daily.     atorvastatin (LIPITOR) 80 MG tablet Take 1 tablet (80 mg total) by mouth every evening. 90 tablet 2  cetirizine (ZYRTEC) 10 MG tablet Take 10 mg by mouth daily.     Cholecalciferol (VITAMIN D3) 50 MCG (2000 UT) CAPS Take 2,000 Units by mouth daily.     clopidogrel (PLAVIX) 75 MG tablet Take 1 tablet (75 mg total) by mouth daily. 90 tablet 3   [employer]S VITAMIN B-12 2000 MCG TBCR Take 2,000 mcg by mouth daily.     diazepam (VALIUM) 5 MG tablet Take 1 tablet (5 mg total) by mouth every 12 (twelve) hours as needed for anxiety. 45 tablet 2   Evolocumab (REPATHA SURECLICK) [id number] MG/ML SOAJ Inject 140 mg into the skin every 14 (fourteen) days. 6 mL 3   ezetimibe (ZETIA) 10 MG tablet Take 1 tablet (10 mg total) by mouth daily. 90 tablet 3   isosorbide mononitrate (IMDUR) 60 MG 24 hr tablet Take 1.5 tablets (90 mg total) by mouth daily. 135 tablet 3   meclizine (ANTIVERT) 25 MG tablet TAKE 1 TABLET BY MOUTH THREE TIMES DAILY AS NEEDED FOR DIZZINESS 90 tablet 2   nitroGLYCERIN (NITROSTAT) 0.4 MG SL tablet Place 1 tablet (0.4 mg total) under the tongue every 5 (five) minutes as needed for chest pain (up to 3 doses). 25  tablet 3   pantoprazole (PROTONIX) 40 MG tablet Take 1 tablet (40 mg total) by mouth 2 (two) times daily. 180 tablet 2   ranolazine (RANEXA) 1000 MG SR tablet Take 1 tablet (1,000 mg total) by mouth 2 (two) times daily. 180 tablet 2   No current facility-administered medications for this visit.     Review of Systems  Please see the history of present illness.     All other systems reviewed and are otherwise negative except as noted above.  Physical Exam    Wt Readings from Last 3 Encounters:  02/24/22 178 lb 9.6 oz (81 kg)  12/22/21 185 lb 6.4 oz (84.1 kg)  06/24/21 169 lb (76.7 kg)   [id number] were no vitals filed for this visit.,There is no height or weight on file to calculate BMI.  Constitutional:      Appearance: Healthy appearance. Not in distress.  Neck:     Vascular: JVD normal.  Pulmonary:     Effort: Pulmonary effort is normal.     Breath sounds: No wheezing. No rales. Diminished in the bases Cardiovascular:     Normal rate. Regular rhythm. Normal S1. Normal S2.      Murmurs: There is no murmur.  Edema:    Peripheral edema absent.  Abdominal:     Palpations: Abdomen is soft non tender. There is no hepatomegaly.  Skin:    General: Skin is warm and dry.  Neurological:     General: No focal deficit present.     Mental Status: Alert and oriented to person, place and time.     Cranial Nerves: Cranial nerves are intact.  EKG/LABS/ Recent Cardiac Studies    ECG personally reviewed by me today -sinus rhythm with rate of 74 bpm and no acute changes consistent with previous EKG.   Lab Results  Component Value Date   WBC 7.7 12/30/2020   HGB 14.5 12/30/2020   HCT 44.5 12/30/2020   MCV 99.8 12/30/2020   PLT 234 12/30/2020   Lab Results  Component Value Date   CREATININE 0.82 12/30/2020   BUN 9 12/30/2020   NA 137 12/30/2020   K 3.8 12/30/2020   CL 103 12/30/2020   CO2 27 12/30/2020   Lab Results  Component Value Date   ALT  15 05/05/2021   AST 14  05/05/2021   ALKPHOS 62 05/05/2021   BILITOT 0.5 05/05/2021   Lab Results  Component Value Date   CHOL 59 (L) 05/05/2021   HDL 43 05/05/2021   LDLCALC 2 05/05/2021   TRIG 51 05/05/2021   CHOLHDL 1.4 05/05/2021    Lab Results  Component Value Date   HGBA1C 5.8 (H) 12/23/2020    Cardiac Studies & Procedures   CARDIAC CATHETERIZATION  CARDIAC CATHETERIZATION 01/02/2021  Narrative   2nd Diag lesion is 60% stenosed.   RPDA lesion is 60% stenosed.   RV Branch lesion is 10% stenosed.   1st Mrg lesion is 10% stenosed.   Mid LAD lesion is 30% stenosed.   The left ventricular systolic function is normal.   LV end diastolic pressure is normal.   The left ventricular ejection fraction is 55-65% by visual estimate.   There is no mitral valve regurgitation.  Patent mid LAD stents without significant restenosis. Moderate ostial Diagonal branch stenosis, jailed by LAD stent with normal flow. Patent obtuse marginal branch stent without restenosis. This vessel arises from what may be a large intermediate branch. 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 The proximal segment of the PDA has a 50-60% stenosis that is unchanged from prior cardiac caths. Normal LV systolic function. Mild elevation LV filling pressures, LVEDP 17 mmHg.  Recommendations: Continue medical management of CAD. I suspect that his dyspnea is secondary to deconditioning but cannot exclude microvascular disease.  Findings Coronary Findings Diagnostic  Dominance: Right  Left Anterior Descending Mid LAD lesion is 30% stenosed. The lesion was previously treated using a drug eluting stent over 2 years ago.  First Septal Branch Vessel is small in size.  Second Diagonal Branch Vessel is small in size. 2nd Diag lesion is 60% stenosed.  Left Circumflex  First Obtuse Marginal Branch 1st Mrg lesion is  10% stenosed. The lesion was previously treated using a drug eluting stent over 2 years ago.  Right Coronary Artery  Right Ventricular Branch RV Branch lesion is 10% stenosed.  Right Posterior Descending Artery RPDA lesion is 60% stenosed.  Intervention  No interventions have been documented.   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  The vessel is very small in size and contains luminal irregularities.  The right coronary is dominant.  The PDA arises from the distal portion of the mid vessel.  The origin of the PDA contains a 50 to 60% eccentric narrowing.  Left main is widely patent.  The left main gives origin to the LAD and what should be considered a ramus intermedius that then bifurcates.  The superior most branch of the ramus bifurcation contains a patent stent.  The LAD is a large vessel and contains an ostial to mid vessel stent the jails a diagonal distally.  The diagonal contains ostial 50 to 70% narrowing.  Normal left ventricular function.  LVEF 55%.  RECOMMENDATIONS:   The previously placed stents are widely patent.  Ongoing chest pain without enzyme abnormality or EKG changes suggests the possibility of an alternative explanation.  Per treating team  Findings Coronary Findings Diagnostic  Dominance: Right  Left Anterior Descending Mid LAD lesion is 30% stenosed. The lesion was previously treated.  First Septal Branch Vessel is small in  size.  Second Diagonal Branch Vessel is small in size. 2nd Diag lesion is 60% stenosed.  Left Circumflex  First Obtuse Marginal Branch 1st Mrg lesion is 10% stenosed. The lesion was previously treated.  Right Coronary Artery  Right Ventricular Branch Vessel is small in size. RV Branch lesion is 10% stenosed.  Right Posterior Descending Artery RPDA lesion is 60%  stenosed.  Intervention  No interventions have been documented.     ECHOCARDIOGRAM  ECHOCARDIOGRAM COMPLETE 03/11/2022  Narrative ECHOCARDIOGRAM REPORT    Patient Name:   Bill Johnson Date of Exam: 03/11/2022 Medical Rec #:  [id number]        Height:       66.0 in Accession #:    [id number]       Weight:       178.6 lb Date of Birth:  04-05-1955        BSA:          1.906 m Patient Age:    36 years         BP:           152/69 mmHg Patient Gender: M                HR:           85 bpm. Exam Location:  [facility]  Procedure: 2D Echo, Cardiac Doppler and Color Doppler  Indications:    I25.118 (ICD-10-CM) - Coronary artery disease of native artery of native heart with stable angina pectoris  History:        Patient has prior history of Echocardiogram examinations, most recent 03/19/2019. CAD, COPD; Risk Factors:Hypertension, Dyslipidemia and Current Smoker. GERD.  Sonographer:    Alvino Chapel RCS Referring Phys: [id number] [place]   1. Left ventricular ejection fraction, by estimation, is 55 to 60%. The left ventricle has normal function. The left ventricle has no regional wall motion abnormalities. Left ventricular diastolic parameters are indeterminate. 2. Right ventricular systolic function is normal. The right ventricular size is normal. Tricuspid regurgitation signal is inadequate for assessing PA pressure. 3. The mitral valve is grossly normal. Trivial mitral valve regurgitation. 4. The aortic valve is tricuspid. Aortic valve regurgitation is mild. Aortic regurgitation PHT measures 703 msec. 5. The inferior vena cava is normal in size with greater than 50% respiratory variability, suggesting right atrial pressure of 3 mmHg.  Comparison(s): No significant change from prior study. Prior images reviewed side by side.  FINDINGS Left Ventricle: Left ventricular ejection fraction, by estimation, is 55 to 60%. The left ventricle has normal function.  The left ventricle has no regional wall motion abnormalities. The left ventricular internal cavity size was normal in size. There is borderline left ventricular hypertrophy. Left ventricular diastolic parameters are indeterminate.  Right Ventricle: The right ventricular size is normal. No increase in right ventricular wall thickness. Right ventricular systolic function is normal. Tricuspid regurgitation signal is inadequate for assessing PA pressure.  Left Atrium: Left atrial size was normal in size.  Right Atrium: Right atrial size was normal in size.  Pericardium: There is no evidence of pericardial effusion.  Mitral Valve: The mitral valve is grossly normal. Trivial mitral valve regurgitation.  Tricuspid Valve: The tricuspid valve is grossly normal. Tricuspid valve regurgitation is trivial.  Aortic Valve: The aortic valve is tricuspid. Aortic valve regurgitation is mild. Aortic regurgitation PHT measures 703 msec.  Pulmonic Valve: The pulmonic valve was grossly normal. Pulmonic valve regurgitation is trivial.  Aorta: The aortic  root is normal in size and structure.  Venous: The inferior vena cava is normal in size with greater than 50% respiratory variability, suggesting right atrial pressure of 3 mmHg.  IAS/Shunts: No atrial level shunt detected by color flow Doppler.   LEFT VENTRICLE PLAX 2D LVIDd:         4.90 cm   Diastology LVIDs:         2.60 cm   LV e' medial:    5.44 cm/s LV PW:         1.10 cm   LV E/e' medial:  9.3 LV IVS:        0.90 cm   LV e' lateral:   9.25 cm/s LVOT diam:     2.20 cm   LV E/e' lateral: 5.5 LV SV:         73 LV SV Index:   38 LVOT Area:     3.80 cm   RIGHT VENTRICLE RV S prime:     12.80 cm/s TAPSE (M-mode): 2.0 cm  LEFT ATRIUM             Index        RIGHT ATRIUM           Index LA diam:        3.60 cm 1.89 cm/m   RA Area:     14.30 cm LA Vol (A2C):   22.1 ml 11.60 ml/m  RA Volume:   37.00 ml  19.41 ml/m LA Vol (A4C):   37.8 ml  19.83 ml/m LA Biplane Vol: 29.4 ml 15.43 ml/m AORTIC VALVE LVOT Vmax:   110.00 cm/s LVOT Vmean:  63.800 cm/s LVOT VTI:    0.192 m AI PHT:      703 msec  AORTA Ao Root diam: 3.50 cm  MITRAL VALVE MV Area (PHT): 2.87 cm    SHUNTS MV Decel Time: 264 msec    Systemic VTI:  0.19 m MV E velocity: 50.50 cm/s  Systemic Diam: 2.20 cm MV A velocity: 62.40 cm/s MV E/A ratio:  0.81  Rozann Lesches MD Electronically signed by Rozann Lesches MD Signature Date/Time: 03/11/2022/1:38:30 PM    Final     CT SCANS  CT CORONARY MORPH W/CTA COR W/SCORE 06/15/2019  Addendum 06/15/2019 10:48 PM ADDENDUM REPORT: 06/15/2019 22:46  EXAM: Cardiac/Coronary  CT  TECHNIQUE: The patient was scanned on a [employer].  FINDINGS: A 120 kV prospective scan was triggered in the descending thoracic aorta at 111 HU's. Axial non-contrast 3 mm slices were carried out through the heart. The data set was analyzed on a dedicated work station and scored using the [place]. Gantry rotation speed was 250 msecs and collimation was .6 mm. No beta blockade and 0.8 mg of sl NTG was given. The 3D data set was reconstructed in 5% intervals of the 67-82 % of the R-R cycle. Diastolic phases were analyzed on a dedicated work station using MPR, MIP and VRT modes. The patient received 80 cc of contrast.  Aorta:  Normal size.  No calcifications.  No dissection.  Aortic Valve:  Trileaflet.  No calcifications.  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  RCA is a large dominant artery that gives rise to PDA and PLVB. There is a moderate complex mixed plaque in the proximal RCA with associated stenosis of 50-69%. There is a moderate calcified plaque in a large acute RV marginal branch with associated stenosis of 50-69% followed by a minimal calcified  plaque with positive remodeling with associated stenosis of 0-24%.  Left main is a large artery that gives rise to  LAD and Ramus. There is no plaque.  LAD is a large vessel. There is moderate calcified plaque in the proximal LAD with associated stenosis of 50-69%. There is severe calcified plaque in the mid LAD with associated stenosis of 70-99%. There is mild scattered calcified plaque in the distal LAD with associated stenosis of 25-49%.  The ramus is a large trifurcating vessel. There is mild calcified plaque in the proximal Ramus with associated stenosis of 25-49%. There is a severe noncalcified plaque in the proximal superior branch of the Ramus with associated stenosis of 70-99%.  LCX is a non-dominant artery and has an anomalous origin off the RCA traversing posterior to the aorta. It is a small caliber vessel with moderate calcified plaque with associated stenosis of 50-60%.  Other findings:  Normal pulmonary vein drainage into the left atrium.  Normal let atrial appendage without a thrombus.  Normal size of the pulmonary artery.  Possible left atrial diverticulum.  IMPRESSION: 1. Coronary calcium score of 1108. This was 93rd percentile for age and sex matched control.  2. 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  3. Severe atherosclerosis of the LAD and moderate atherosclerosis of the RCA and anomalous LCx. CAD-RADS 4a.  4.  Recommend cardiac catheterization.  5. Consider symptom guided anti-ischemic and preventive pharmacotherapy as well as risk factor modification per guideline directed care.  6.   This study has been sent for FFR analysis.  Fransico Him   Electronically Signed By: Fransico Him On: 06/15/2019 22:46  Narrative EXAM: OVER-READ INTERPRETATION  CT CHEST  The following report is an over-read performed by radiologist Dr. Markus Daft of [facility], [place] on 06/15/2019. This over-read does not include interpretation of cardiac or coronary anatomy or pathology. The  coronary calcium score/coronary CTA interpretation by the cardiologist is attached.  COMPARISON:  Chest CT 05/19/2019  FINDINGS: Vascular: Normal caliber of the visualized thoracic aorta without significant atherosclerotic disease. Limited evaluation of the pulmonary arteries. No significant pericardial effusion.  Mediastinum/Nodes: Prominent right hilar tissue measuring 1.3 cm in the short axis on sequence 11, image 17. Left hilar tissue measures up to 0.8 cm on sequence 11, image 17. Mild soft tissue fullness in the right infrahilar region on sequence 11, image 37. No significant mediastinal lymphadenopathy.  Lungs/Pleura: Again noted is scarring along the right minor fissure. Stable 2 mm nodule in the right middle lobe on sequence 12, image 28. Stable tiny nodule in the right middle lobe on image 29. Stable 4 mm nodule in the right middle lobe on image 38. Stable 4 mm nodule in the superior segment of the right lower lobe on image 5. Dependent densities in the lower lobes likely represent atelectasis. No large pleural effusions. Stable oval shaped nodule in the left upper lobe on sequence 12, image 13 measures 6 x 3 cm. There is another elongated density along the left side of the mediastinum on sequence 4, image 11 that could represent focal pleural thickening and similar to the recent chest CT.  Upper Abdomen: Limited evaluation of the upper abdomen.  Musculoskeletal: Degenerative disc and endplate changes in the thoracic spine.  IMPRESSION: 1. No acute abnormality. 2. Mild soft tissue fullness in the bilateral hilar regions. Findings are nonspecific but could be reactive. In addition, there are several small pulmonary nodules with  mean diameter of less than 5 mm. These pulmonary nodules are similar to the exam on 05/19/2019. Consider a follow-up chest CT with IV contrast in 6-12 months to evaluate the hilar tissue.  Electronically Signed: By: Markus Daft M.D. On:  06/15/2019 12:07          Assessment & Plan    1.  Surgical clearance:  Mr. Rentmeester perioperative risk of a major cardiac event is 0.9% according to the Revised Cardiac Risk Index (RCRI).  Therefore, he is at low risk for perioperative complications.   His functional capacity is fair at 4.73 METs according to the [employer] Activity Status Index (DASI). Recommendations: According to ACC/AHA guidelines, no further cardiovascular testing needed.  The patient may proceed to surgery at acceptable risk.   Antiplatelet and/or Anticoagulation Recommendations: Clopidogrel (Plavix) can be held for 5 days prior to his surgery and resumed as soon as possible post op.  2.  Coronary artery disease: -Most recent left heart cath completed 12/2020 with patent LAD and OM stents, 60% D2, 30% mLAD, 60% RPDA which was overall stable from prior.  -Continue medical management with Plavix 75 mg, ASA 81 mg, Repatha 140 mg, Zetia 10 mg, Imdur 60 mg, Ranexa 1000 mg twice daily  3.  Stable angina: -Today patient reports no chest pain currently -Continue GDMT as noted above  4.  Essential hypertension: -Today patient's blood pressure was stable at 108/64  Disposition: Follow-up with Dr. Johney Frame as scheduled    Medication Adjustments/Labs and Tests Ordered: Current medicines are reviewed at length with the patient today.  Concerns regarding medicines are outlined above.   Signed, Mable Fill, Marissa Nestle, NP 03/30/2022, 11:28 AM [facility]  Note:  This document was prepared using Dragon voice recognition software and may include unintentional dictation errors.

## 2022-03-31 ENCOUNTER — Ambulatory Visit: Payer: 59 | Attending: Nurse Practitioner | Admitting: Nurse Practitioner

## 2022-03-31 ENCOUNTER — Encounter: Payer: Self-pay | Admitting: Nurse Practitioner

## 2022-03-31 VITALS — BP 108/64 | HR 74 | Ht 66.0 in | Wt 175.4 lb

## 2022-03-31 DIAGNOSIS — E782 Mixed hyperlipidemia: Secondary | ICD-10-CM | POA: Diagnosis not present

## 2022-03-31 DIAGNOSIS — I2510 Atherosclerotic heart disease of native coronary artery without angina pectoris: Secondary | ICD-10-CM | POA: Diagnosis not present

## 2022-03-31 DIAGNOSIS — Z01810 Encounter for preprocedural cardiovascular examination: Secondary | ICD-10-CM

## 2022-03-31 DIAGNOSIS — I200 Unstable angina: Secondary | ICD-10-CM

## 2022-03-31 DIAGNOSIS — I10 Essential (primary) hypertension: Secondary | ICD-10-CM

## 2022-03-31 MED ORDER — CLOPIDOGREL BISULFATE 75 MG PO TABS: 75.0000 mg | ORAL_TABLET | Freq: Every day | ORAL | 3 refills | Status: DC

## 2022-03-31 NOTE — Patient Instructions (Addendum)
Medication Instructions:  Your physician recommends that you continue on your current medications as directed. Please refer to the Current Medication list given to you today. *If you need a refill on your cardiac medications before your next appointment, please call your pharmacy*   Lab Work: None ordered   Testing/Procedures: None ordered   Follow-Up: At [facility], you and your health needs are our priority.  As part of our continuing mission to provide you with exceptional heart care, we have created designated Provider Care Teams.  These Care Teams include your primary Cardiologist (physician) and Advanced Practice Providers (APPs -  Physician Assistants and Nurse Practitioners) who all work together to provide you with the care you need, when you need it.  We recommend signing up for the patient portal called "MyChart".  Sign up information is provided on this After Visit Summary.  MyChart is used to connect with patients for Virtual Visits (Telemedicine).  Patients are able to view lab/test results, encounter notes, upcoming appointments, etc.  Non-urgent messages can be sent to your provider as well.   To learn more about what you can do with MyChart, go to [email or web address].    Your next appointment:   Follow up as scheduled   Provider:   Freada Bergeron, MD   Other Instructions You are cleared for your procedure.

## 2022-04-07 DIAGNOSIS — M1711 Unilateral primary osteoarthritis, right knee: Secondary | ICD-10-CM | POA: Diagnosis not present

## 2022-04-07 DIAGNOSIS — M25561 Pain in right knee: Secondary | ICD-10-CM | POA: Diagnosis not present

## 2022-04-14 DIAGNOSIS — M47812 Spondylosis without myelopathy or radiculopathy, cervical region: Secondary | ICD-10-CM | POA: Diagnosis not present

## 2022-04-27 DIAGNOSIS — H905 Unspecified sensorineural hearing loss: Secondary | ICD-10-CM | POA: Diagnosis not present

## 2022-05-19 DIAGNOSIS — M12811 Other specific arthropathies, not elsewhere classified, right shoulder: Secondary | ICD-10-CM | POA: Diagnosis not present

## 2022-05-19 DIAGNOSIS — M12812 Other specific arthropathies, not elsewhere classified, left shoulder: Secondary | ICD-10-CM | POA: Diagnosis not present

## 2022-05-27 ENCOUNTER — Ambulatory Visit: Payer: Self-pay | Admitting: Student

## 2022-06-01 NOTE — Progress Notes (Signed)
COVID Vaccine received:  []  No [x]  Yes Date of any COVID positive Test in last 90 days:  PCP - Elder Negus NP Cardiologist - Laurance Flatten MD Medical clearance by Elder Negus 04/07/22. On chart Chest x-ray - 06/24/21 Epic EKG - 04/01/22 Epic  Stress Test - N/A ECHO - 03/11/22 Epic Cardiac Cath - x4 last on 01/02/21 Epic  Bowel Prep - [x]  No  []   Yes ______  Pacemaker / ICD device [x]  No []  Yes   Spinal Cord Stimulator:[x]  No []  Yes       History of Sleep Apnea? [x]  No []  Yes   CPAP used?- [x]  No []  Yes    Does the patient monitor blood sugar?          []  No []  Yes  [x]  N/A  Patient has: [x]  NO Hx DM   []  Pre-DM                 []  DM1  []   DM2 Does patient have a [employer] or Dexacom? [x]  No []  Yes   Fasting Blood Sugar Ranges- N/A Checks Blood Sugar _____ times a dayN/A Hgb A1c 5.8 12/23/20  GLP1 agonist / usual dose - N/A GLP1 instructions:  SGLT-2 inhibitors / usual dose - N/A SGLT-2 instructions:   Blood Thinner / Instructions:Plavix and ASA. Instructed by PCP to hold 7 days. Last day 06/02/22 9am Aspirin Instructions:  Comments:   Activity level: Patient is able / unable to climb a flight of stairs without difficulty; [x]  No CP  [x]  No SOB, but would have ___   Patient can perform ADLs without assistance.   Anesthesia review: Multiple stents, CAD,HTN,  Patient denies shortness of breath, fever, cough and chest pain at PAT appointment.  Patient verbalized understanding and agreement to the Pre-Surgical Instructions that were given to them at this PAT appointment. Patient was also educated of the need to review these PAT instructions again prior to his/her surgery.I reviewed the appropriate phone numbers to call if they have any and questions or concerns.

## 2022-06-01 NOTE — Patient Instructions (Signed)
SURGICAL WAITING ROOM VISITATION  Patients having surgery or a procedure may have no more than 2 support people in the waiting area - these visitors may rotate.    Children under the age of 53 must have an adult with them who is not the patient.  Due to an increase in RSV and influenza rates and associated hospitalizations, children ages 73 and under may not visit patients in [facility] hospitals.  If the patient needs to stay at the hospital during part of their recovery, the visitor guidelines for inpatient rooms apply. Pre-op nurse will coordinate an appropriate time for 1 support person to accompany patient in pre-op.  This support person may not rotate.    Please refer to the [facility] website for the visitor guidelines for Inpatients (after your surgery is over and you are in a regular room).    Your procedure is scheduled on: 06/10/22   Report to [facility] Main Entrance    Report to admitting at 9:00 AM   Call this number if you have problems the morning of surgery [phone]   Do not eat food :After Midnight.   After Midnight you may have the following liquids until 8:30 AM DAY OF SURGERY  Water Non-Citrus Juices (without pulp, NO RED-Apple, White grape, White cranberry) Black Coffee (NO MILK/CREAM OR CREAMERS, sugar ok)  Clear Tea (NO MILK/CREAM OR CREAMERS, sugar ok) regular and decaf                             Plain Jell-O (NO RED)                                           Fruit ices (not with fruit pulp, NO RED)                                     Popsicles (NO RED)                                                               Sports drinks like Gatorade (NO RED)               The day of surgery:  Drink ONE (1) Pre-Surgery Clear Ensure or G2 at 8:30 AM the morning of surgery. Drink in one sitting. Do not sip.  This drink was given to you during your hospital  pre-op appointment visit. Nothing else to drink after completing the  Pre-Surgery Clear  Ensure or G2.          If you have questions, please contact your surgeon's office.   FOLLOW BOWEL PREP AND ANY ADDITIONAL PRE OP INSTRUCTIONS YOU RECEIVED FROM YOUR SURGEON'S OFFICE!!!     Oral Hygiene is also important to reduce your risk of infection.                                    Remember - BRUSH YOUR TEETH THE MORNING OF SURGERY WITH YOUR REGULAR TOOTHPASTE  DENTURES WILL BE REMOVED PRIOR TO SURGERY PLEASE DO NOT APPLY "Poly grip" OR ADHESIVES!!!   Do NOT smoke after Midnight   Take these medicines the morning of surgery with A SIP OF WATER: Tylenol, Cetirizine, Diazepam, Ezetimibe, IMDUR, Meclizine, Pantoprazole, Ranexa  These are anesthesia recommendations for holding your anticoagulants.  Please contact your prescribing physician to confirm IF it is safe to hold your anticoagulants for this length of time.   Eliquis Apixaban   72 hours   Xarelto Rivaroxaban   72 hours  Plavix Clopidogrel   120 hours  Pletal Cilostazol   120 hours    DO NOT TAKE ANY ORAL DIABETIC MEDICATIONS DAY OF YOUR SURGERY  Bring CPAP mask and tubing day of surgery.                              You may not have any metal on your body including jewelry, and body piercing             Do not wear lotions, powders, cologne, or deodorant  Do not shave  48 hours prior to surgery.               Men may shave face and neck.   Do not bring valuables to the hospital. [facility] IS NOT             RESPONSIBLE   FOR VALUABLES.   Contacts, glasses, dentures or bridgework may not be worn into surgery.   Bring small overnight bag day of surgery.   DO NOT BRING YOUR HOME MEDICATIONS TO THE HOSPITAL. PHARMACY WILL DISPENSE MEDICATIONS LISTED ON YOUR MEDICATION LIST TO YOU DURING YOUR ADMISSION IN THE HOSPITAL!   Special Instructions: Bring a copy of your healthcare power of attorney and living will documents the day of surgery if you haven't scanned them before.              Please read over the  following fact sheets you were given: IF YOU HAVE QUESTIONS ABOUT YOUR PRE-OP INSTRUCTIONS PLEASE CALL [phone] Contras    If you received a COVID test during your pre-op visit  it is requested that you wear a mask when out in public, stay away from anyone that may not be feeling well and notify your surgeon if you develop symptoms. If you test positive for Covid or have been in contact with anyone that has tested positive in the last 10 days please notify you surgeon.       Incentive Spirometer  An incentive spirometer is a tool that can help keep your lungs clear and active. This tool measures how well you are filling your lungs with each breath. Taking long deep breaths may help reverse or decrease the chance of developing breathing (pulmonary) problems (especially infection) following: A long period of time when you are unable to move or be active. BEFORE THE PROCEDURE  If the spirometer includes an indicator to show your best effort, your nurse or respiratory therapist will set it to a desired goal. If possible, sit up straight or lean slightly forward. Try not to slouch. Hold the incentive spirometer in an upright position. INSTRUCTIONS FOR USE  Sit on the edge of your bed if possible, or sit up as far as you can in bed or on a chair. Hold the incentive spirometer in an upright position. Breathe out normally. Place the mouthpiece in your mouth and seal your lips tightly  around it. Breathe in slowly and as deeply as possible, raising the piston or the ball toward the top of the column. Hold your breath for 3-5 seconds or for as long as possible. Allow the piston or ball to fall to the bottom of the column. Remove the mouthpiece from your mouth and breathe out normally. Rest for a few seconds and repeat Steps 1 through 7 at least 10 times every 1-2 hours when you are awake. Take your time and take a few normal breaths between deep breaths. The spirometer may include an indicator to  show your best effort. Use the indicator as a goal to work toward during each repetition. After each set of 10 deep breaths, practice coughing to be sure your lungs are clear. If you have an incision (the cut made at the time of surgery), support your incision when coughing by placing a pillow or rolled up towels firmly against it. Once you are able to get out of bed, walk around indoors and cough well. You may stop using the incentive spirometer when instructed by your caregiver.  RISKS AND COMPLICATIONS Take your time so you do not get dizzy or light-headed. If you are in pain, you may need to take or ask for pain medication before doing incentive spirometry. It is harder to take a deep breath if you are having pain. AFTER USE Rest and breathe slowly and easily. It can be helpful to keep track of a log of your progress. Your caregiver can provide you with a simple table to help with this. If you are using the spirometer at home, follow these instructions: SEEK MEDICAL CARE IF:  You are having difficultly using the spirometer. You have trouble using the spirometer as often as instructed. Your pain medication is not giving enough relief while using the spirometer. You develop fever of 100.5 F (38.1 C) or higher. SEEK IMMEDIATE MEDICAL CARE IF:  You cough up bloody sputum that had not been present before. You develop fever of 102 F (38.9 C) or greater. You develop worsening pain at or near the incision site. MAKE SURE YOU:  Understand these instructions. Will watch your condition. Will get help right away if you are not doing well or get worse. Document Released: 05/25/2006 Document Revised: 04/06/2011 Document Reviewed: 07/26/2006 [facility] Patient Information 2014 [place], [place].   ________________________________________________________________________

## 2022-06-02 ENCOUNTER — Other Ambulatory Visit: Payer: Self-pay | Admitting: Internal Medicine

## 2022-06-02 NOTE — Progress Notes (Signed)
  COVID Vaccine received:  []  No [x]  Yes Date of any COVID positive Test in last 90 days:   PCP - Elder Negus NP Cardiologist - Verne Carrow MD  Cardiac clearance by Leodis Rains, NP 03/30/22 in Epic Medical clearance by Elder Negus 04/09/22 on chart   Chest x-ray - 03/19/22 [facility] EKG - 04/01/22 Epic  Stress Test -  ECHO - 03/11/22 Epic Cardiac Cath - x4 last on 01/02/21 Epic   Bowel Prep - []  No  []   Yes ______   Pacemaker / ICD device []  No []  Yes   Spinal Cord Stimulator:[]  No []  Yes       History of Sleep Apnea? []  No []  Yes   CPAP used?- []  No []  Yes     Does the patient monitor blood sugar?          []  No []  Yes  []  N/A   Patient has: []  NO Hx DM   []  Pre-DM                 []  DM1  []   DM2 Does patient have a [employer] or Dexacom? []  No []  Yes   Fasting Blood Sugar Ranges-  Checks Blood Sugar _____ times a day Hgb A1c 5.8 12/23/20   GLP1 agonist / usual dose -  GLP1 instructions:  SGLT-2 inhibitors / usual dose -  SGLT-2 instructions:    Blood Thinner / Instructions: Plavix, hold 5 days per cards Aspirin Instructions: ASA 81   Comments:    Activity level: Patient is able / unable to climb a flight of stairs without difficulty; []  No CP  []  No SOB, but would have ___   Patient can / can not perform ADLs without assistance.    Anesthesia review: HTN, CAD, aortic atherosclerosis, COPD, stents   Patient denies shortness of breath, fever, cough and chest pain at PAT appointment.   Patient verbalized understanding and agreement to the Pre-Surgical Instructions that were given to them at this PAT appointment. Patient was also educated of the need to review these PAT instructions again prior to his/her surgery.I reviewed the appropriate phone numbers to call if they have any and questions or concerns.

## 2022-06-03 ENCOUNTER — Other Ambulatory Visit: Payer: Self-pay

## 2022-06-03 ENCOUNTER — Encounter (HOSPITAL_COMMUNITY)
Admission: RE | Admit: 2022-06-03 | Discharge: 2022-06-03 | Disposition: A | Discharge status: Home / Self Care | Payer: 59 | Source: Ambulatory Visit | Attending: Orthopedic Surgery | Admitting: Orthopedic Surgery

## 2022-06-03 ENCOUNTER — Encounter (HOSPITAL_COMMUNITY): Payer: Self-pay

## 2022-06-03 VITALS — BP 121/75 | HR 79 | Temp 98.0°F | Resp 18 | Ht 66.0 in | Wt 176.0 lb

## 2022-06-03 DIAGNOSIS — K219 Gastro-esophageal reflux disease without esophagitis: Secondary | ICD-10-CM | POA: Diagnosis not present

## 2022-06-03 DIAGNOSIS — Z955 Presence of coronary angioplasty implant and graft: Secondary | ICD-10-CM | POA: Diagnosis not present

## 2022-06-03 DIAGNOSIS — I2510 Atherosclerotic heart disease of native coronary artery without angina pectoris: Secondary | ICD-10-CM | POA: Insufficient documentation

## 2022-06-03 DIAGNOSIS — I10 Essential (primary) hypertension: Secondary | ICD-10-CM | POA: Diagnosis not present

## 2022-06-03 DIAGNOSIS — Z7902 Long term (current) use of antithrombotics/antiplatelets: Secondary | ICD-10-CM | POA: Insufficient documentation

## 2022-06-03 DIAGNOSIS — Z01818 Encounter for other preprocedural examination: Secondary | ICD-10-CM

## 2022-06-03 DIAGNOSIS — M1711 Unilateral primary osteoarthritis, right knee: Secondary | ICD-10-CM | POA: Insufficient documentation

## 2022-06-03 DIAGNOSIS — Z01812 Encounter for preprocedural laboratory examination: Secondary | ICD-10-CM | POA: Insufficient documentation

## 2022-06-03 DIAGNOSIS — Z87891 Personal history of nicotine dependence: Secondary | ICD-10-CM | POA: Diagnosis not present

## 2022-06-03 HISTORY — DX: Angina pectoris, unspecified: I20.9

## 2022-06-03 HISTORY — DX: Chronic obstructive pulmonary disease, unspecified: J44.9

## 2022-06-03 LAB — BASIC METABOLIC PANEL
Anion gap: 8 (ref 5–15)
BUN: 6 mg/dL — ABNORMAL LOW (ref 8–23)
CO2: 21 mmol/L — ABNORMAL LOW (ref 22–32)
Calcium: 8.4 mg/dL — ABNORMAL LOW (ref 8.9–10.3)
Chloride: 105 mmol/L (ref 98–111)
Creatinine, Ser: 0.79 mg/dL (ref 0.61–1.24)
GFR, Estimated: >60 mL/min (ref >60)
Glucose, Bld: 97 mg/dL (ref 70–99)
Potassium: 4.6 mmol/L (ref 3.5–5.1)
Sodium: 134 mmol/L — ABNORMAL LOW (ref 135–145)

## 2022-06-03 LAB — CBC
HCT: 40.7 % (ref 39.0–52.0)
Hemoglobin: 13.6 g/dL (ref 13.0–17.0)
MCH: 32.8 pg (ref 26.0–34.0)
MCHC: 33.4 g/dL (ref 30.0–36.0)
MCV: 98.1 fL (ref 80.0–100.0)
Platelets: 153 10*3/uL (ref 150–400)
RBC: 4.15 MIL/uL — ABNORMAL LOW (ref 4.22–5.81)
RDW: 13.2 % (ref 11.5–15.5)
WBC: 6.8 10*3/uL (ref 4.0–10.5)
nRBC: 0 % (ref 0.0–0.2)

## 2022-06-03 LAB — SURGICAL PCR SCREEN
MRSA, PCR: NEGATIVE
Staphylococcus aureus: POSITIVE — AB

## 2022-06-04 NOTE — Progress Notes (Signed)
Patient called to inform that he sees a neurosurgeon- Dr. Lanae Crumbly and he gets injections in his neck every 6 months. Last injection 04/14/22.

## 2022-06-04 NOTE — Progress Notes (Signed)
PCR results of staph positive sent to Dr. Linna Caprice

## 2022-06-04 NOTE — Anesthesia Preprocedure Evaluation (Addendum)
Anesthesia Evaluation  Patient identified by MRN, date of birth, ID band Patient awake    Reviewed: Allergy & Precautions, NPO status , Patient's Chart, lab work & pertinent test results  Airway Mallampati: III  TM Distance: >3 FB Neck ROM: Full    Dental  (+) Edentulous Lower, Edentulous Upper   Pulmonary COPD, former smoker   Pulmonary exam normal        Cardiovascular hypertension, + angina  + CAD and + Cardiac Stents  Normal cardiovascular exam     Neuro/Psych  PSYCHIATRIC DISORDERS Anxiety     negative neurological ROS     GI/Hepatic negative GI ROS, Neg liver ROS,,,  Endo/Other  negative endocrine ROS    Renal/GU negative Renal ROS     Musculoskeletal  (+) Arthritis ,    Abdominal   Peds  Hematology  (+) Blood dyscrasia (Plavix) Plt. 153   Anesthesia Other Findings Right knee osteoarthritis  Reproductive/Obstetrics                             Anesthesia Physical Anesthesia Plan  ASA: 3  Anesthesia Plan: Spinal and Regional   Post-op Pain Management: Regional block*   Induction: Intravenous  PONV Risk Score and Plan: 1 and Ondansetron, Dexamethasone, Propofol infusion, Midazolam and Treatment may vary due to age or medical condition  Airway Management Planned: Simple Face Mask  Additional Equipment:   Intra-op Plan:   Post-operative Plan:   Informed Consent: I have reviewed the patients History and Physical, chart, labs and discussed the procedure including the risks, benefits and alternatives for the proposed anesthesia with the patient or authorized representative who has indicated his/her understanding and acceptance.     Dental advisory given  Plan Discussed with: CRNA  Anesthesia Plan Comments: (PAT note 06/03/2022)       Anesthesia Quick Evaluation

## 2022-06-04 NOTE — Progress Notes (Addendum)
Anesthesia chart review   Case: [id number] Date/Time: 06/10/22 1115   Procedure: COMPUTER ASSISTED TOTAL KNEE ARTHROPLASTY (Right: Knee) - 150   Anesthesia type: Spinal   Pre-op diagnosis: Right knee osteoarthritis   Location: [facility] ROOM 07 / [facility]   Surgeons: Samson Frederic, MD       DISCUSSION: 67 year old former smoker with history of GERD, HTN, COPD, CAD (DES 2021), right knee OA scheduled for above procedure 06/10/2022 with Dr. Samson Frederic.  Patient seen by cardiology 03/31/2022 for preoperative evaluation.  Per office visit note, "Mr. Helzer's perioperative risk of a major cardiac event is 0.9% according to the Revised Cardiac Risk Index (RCRI).  Therefore, he is at low risk for perioperative complications.   His functional capacity is fair at 4.73 METs according to the Duke Activity Status Index (DASI). Recommendations: According to ACC/AHA guidelines, no further cardiovascular testing needed.  The patient may proceed to surgery at acceptable risk.   Antiplatelet and/or Anticoagulation Recommendations: Clopidogrel (Plavix) can be held for 5 days prior to his surgery and resumed as soon as possible post op."  Patient reports last dose of Plavix 06/02/2022 AM.  Patient also received clearance from PCP which is on chart.  Patient seen by pulmonology in the past.  Per office visit note patient does not meet criteria for COPD based on PFTs.  Advised to follow-up as needed.  Anticipate pt can proceed with planned procedure barring acute status change.   VS: BP 121/75   Pulse 79   Temp 36.7 C (Oral)   Resp 18   Ht 5\' 6"  (1.676 m)   Wt 79.8 kg   SpO2 96%   BMI 28.41 kg/m   PROVIDERS: Elder Negus, NP is PCP  Cardiologist - Laurance Flatten MD  LABS: Labs reviewed: Acceptable for surgery. (all labs ordered are listed, but only abnormal results are displayed)  Labs Reviewed  SURGICAL PCR SCREEN - Abnormal; Notable for the following components:      Result Value    Staphylococcus aureus POSITIVE (*)    All other components within normal limits  BASIC METABOLIC PANEL - Abnormal; Notable for the following components:   Sodium 134 (*)    CO2 21 (*)    BUN 6 (*)    Calcium 8.4 (*)    All other components within normal limits  CBC - Abnormal; Notable for the following components:   RBC 4.15 (*)    All other components within normal limits     IMAGES:   EKG:   CV: Echo 03/11/2022 1. Left ventricular ejection fraction, by estimation, is 55 to 60%. The  left ventricle has normal function. The left ventricle has no regional  wall motion abnormalities. Left ventricular diastolic parameters are  indeterminate.   2. Right ventricular systolic function is normal. The right ventricular  size is normal. Tricuspid regurgitation signal is inadequate for assessing  PA pressure.   3. The mitral valve is grossly normal. Trivial mitral valve  regurgitation.   4. The aortic valve is tricuspid. Aortic valve regurgitation is mild.  Aortic regurgitation PHT measures 703 msec.   5. The inferior vena cava is normal in size with greater than 50%  respiratory variability, suggesting right atrial pressure of 3 mmHg.   Comparison(s): No significant change from prior study. Prior images  reviewed side by side.  Past Medical History:  Diagnosis Date   Alcohol abuse    Allergy    seasonal   Anginal pain (HCC)    Anxiety  CAD (coronary artery disease)    a. 06/2019 cath - s/p successful PCI of the ostial to mid LAD with IVUS guidance and DES x 2.   COPD (chronic obstructive pulmonary disease) (HCC)    Essential hypertension    GERD (gastroesophageal reflux disease)    HOH (hard of hearing)    Hyperlipidemia    Hypertension    OA (osteoarthritis) of knee    Pulmonary nodules    a. seen on coronary CT 05/2019, will need OP f/u.   Rotator cuff tear arthropathy of both shoulders    On DISABILITY, non operative case   Shoulder pain    Tobacco abuse     Vertigo     Past Surgical History:  Procedure Laterality Date   CARDIAC CATHETERIZATION  06/2005   no significant CAD   CATARACT EXTRACTION Left    COLONOSCOPY WITH PROPOFOL N/A 05/01/2019   Procedure: COLONOSCOPY WITH PROPOFOL;  Surgeon: Corbin Ade, MD;  [facility];  Service: Endoscopy;  Laterality: N/A;  7:30am   COLONOSCOPY WITH PROPOFOL N/A 03/07/2020   Procedure: COLONOSCOPY WITH PROPOFOL;  Surgeon: Corbin Ade, MD;  [facility];  Service: Endoscopy;  Laterality: N/A;  8:30am   CORONARY PRESSURE/FFR STUDY N/A 07/05/2019   Procedure: INTRAVASCULAR PRESSURE WIRE/FFR STUDY;  Surgeon: Swaziland, Peter M, MD;  Location: [facility] INVASIVE CV LAB;  Service: Cardiovascular;  Laterality: N/A;   CORONARY STENT INTERVENTION N/A 07/05/2019   Procedure: CORONARY STENT INTERVENTION;  Surgeon: Swaziland, Peter M, MD;  Location: [facility] INVASIVE CV LAB;  Service: Cardiovascular;  Laterality: N/A;   CORONARY STENT INTERVENTION N/A 07/20/2019   Procedure: CORONARY STENT INTERVENTION;  Surgeon: Corky Crafts, MD;  Location: [facility] INVASIVE CV LAB;  Service: Cardiovascular;  Laterality: N/A;   CORONARY ULTRASOUND/IVUS N/A 07/05/2019   Procedure: Intravascular Ultrasound/IVUS;  Surgeon: Swaziland, Peter M, MD;  Location: [facility] INVASIVE CV LAB;  Service: Cardiovascular;  Laterality: N/A;   ESOPHAGOGASTRODUODENOSCOPY  08/2005   Dr. Marina Goodell: small hiatal hernia   HERNIA REPAIR  1983   LEFT HEART CATH AND CORONARY ANGIOGRAPHY N/A 07/05/2019   Procedure: LEFT HEART CATH AND CORONARY ANGIOGRAPHY;  Surgeon: Swaziland, Peter M, MD;  Location: [facility] INVASIVE CV LAB;  Service: Cardiovascular;  Laterality: N/A;   LEFT HEART CATH AND CORONARY ANGIOGRAPHY N/A 07/20/2019   Procedure: LEFT HEART CATH AND CORONARY ANGIOGRAPHY;  Surgeon: Corky Crafts, MD;  Location: [facility] INVASIVE CV LAB;  Service: Cardiovascular;  Laterality: N/A;   LEFT HEART CATH AND CORONARY ANGIOGRAPHY N/A 04/24/2020   Procedure: LEFT  HEART CATH AND CORONARY ANGIOGRAPHY;  Surgeon: Lyn Records, MD;  Location: [facility] INVASIVE CV LAB;  Service: Cardiovascular;  Laterality: N/A;   LEFT HEART CATH AND CORONARY ANGIOGRAPHY N/A 01/02/2021   Procedure: LEFT HEART CATH AND CORONARY ANGIOGRAPHY;  Surgeon: Kathleene Hazel, MD;  Location: [facility] INVASIVE CV LAB;  Service: Cardiovascular;  Laterality: N/A;   POLYPECTOMY  05/01/2019   Procedure: POLYPECTOMY;  Surgeon: Corbin Ade, MD;  [facility];  Service: Endoscopy;;   POLYPECTOMY  03/07/2020   Procedure: POLYPECTOMY;  Surgeon: Corbin Ade, MD;  [facility];  Service: Endoscopy;;   ROTATOR CUFF REPAIR Bilateral    TONSILLECTOMY  1977    MEDICATIONS:  acetaminophen (TYLENOL) 500 MG tablet   albuterol (VENTOLIN HFA) 108 (90 Base) MCG/ACT inhaler   aspirin 81 MG tablet   atorvastatin (LIPITOR) 80 MG tablet   cetirizine (ZYRTEC) 10 MG tablet  Cholecalciferol (VITAMIN D3) 50 MCG (2000 UT) CAPS   clopidogrel (PLAVIX) 75 MG tablet   [employer] VITAMIN B-12 2000 MCG TBCR   diazepam (VALIUM) 5 MG tablet   Evolocumab (REPATHA SURECLICK) 140 MG/ML SOAJ   ezetimibe (ZETIA) 10 MG tablet   HYDROcodone-acetaminophen (NORCO/VICODIN) 5-325 MG tablet   isosorbide mononitrate (IMDUR) 60 MG 24 hr tablet   meclizine (ANTIVERT) 25 MG tablet   nitroGLYCERIN (NITROSTAT) 0.4 MG SL tablet   pantoprazole (PROTONIX) 40 MG tablet   ranolazine (RANEXA) 1000 MG SR tablet   No current facility-administered medications for this encounter.     Jodell Cipro Ward, PA-C [facility] Pre-Surgical Testing [phone]

## 2022-06-10 ENCOUNTER — Ambulatory Visit (HOSPITAL_BASED_OUTPATIENT_CLINIC_OR_DEPARTMENT_OTHER): Payer: 59 | Admitting: Registered Nurse

## 2022-06-10 ENCOUNTER — Ambulatory Visit (HOSPITAL_COMMUNITY)
Admission: RE | Admit: 2022-06-10 | Discharge: 2022-06-11 | Disposition: A | Discharge status: Home / Self Care | Payer: 59 | Attending: Orthopedic Surgery | Admitting: Orthopedic Surgery

## 2022-06-10 ENCOUNTER — Encounter (HOSPITAL_COMMUNITY)
Admission: RE | Disposition: A | Discharge status: Home / Self Care | Payer: Self-pay | Source: Home / Self Care | Attending: Orthopedic Surgery

## 2022-06-10 ENCOUNTER — Other Ambulatory Visit: Payer: Self-pay

## 2022-06-10 ENCOUNTER — Ambulatory Visit: Payer: Self-pay | Admitting: Student

## 2022-06-10 ENCOUNTER — Ambulatory Visit (HOSPITAL_COMMUNITY): Payer: 59

## 2022-06-10 ENCOUNTER — Encounter (HOSPITAL_COMMUNITY): Payer: Self-pay | Admitting: Orthopedic Surgery

## 2022-06-10 ENCOUNTER — Ambulatory Visit (HOSPITAL_COMMUNITY): Payer: 59 | Admitting: Physician Assistant

## 2022-06-10 DIAGNOSIS — Z471 Aftercare following joint replacement surgery: Secondary | ICD-10-CM | POA: Diagnosis not present

## 2022-06-10 DIAGNOSIS — J449 Chronic obstructive pulmonary disease, unspecified: Secondary | ICD-10-CM | POA: Diagnosis not present

## 2022-06-10 DIAGNOSIS — M1711 Unilateral primary osteoarthritis, right knee: Secondary | ICD-10-CM | POA: Diagnosis present

## 2022-06-10 DIAGNOSIS — R42 Dizziness and giddiness: Secondary | ICD-10-CM | POA: Diagnosis not present

## 2022-06-10 DIAGNOSIS — F419 Anxiety disorder, unspecified: Secondary | ICD-10-CM | POA: Insufficient documentation

## 2022-06-10 DIAGNOSIS — I10 Essential (primary) hypertension: Secondary | ICD-10-CM | POA: Diagnosis not present

## 2022-06-10 DIAGNOSIS — Z87891 Personal history of nicotine dependence: Secondary | ICD-10-CM | POA: Insufficient documentation

## 2022-06-10 DIAGNOSIS — I25119 Atherosclerotic heart disease of native coronary artery with unspecified angina pectoris: Secondary | ICD-10-CM | POA: Diagnosis not present

## 2022-06-10 DIAGNOSIS — Z955 Presence of coronary angioplasty implant and graft: Secondary | ICD-10-CM | POA: Diagnosis not present

## 2022-06-10 DIAGNOSIS — G8918 Other acute postprocedural pain: Secondary | ICD-10-CM | POA: Diagnosis not present

## 2022-06-10 DIAGNOSIS — Z7982 Long term (current) use of aspirin: Secondary | ICD-10-CM | POA: Diagnosis not present

## 2022-06-10 DIAGNOSIS — Z96651 Presence of right artificial knee joint: Secondary | ICD-10-CM

## 2022-06-10 DIAGNOSIS — Z7902 Long term (current) use of antithrombotics/antiplatelets: Secondary | ICD-10-CM | POA: Diagnosis not present

## 2022-06-10 HISTORY — PX: KNEE ARTHROPLASTY: SHX992

## 2022-06-10 SURGERY — ARTHROPLASTY, KNEE, TOTAL, USING IMAGELESS COMPUTER-ASSISTED NAVIGATION
Anesthesia: Regional | Site: Knee | Laterality: Right

## 2022-06-10 MED ORDER — BUPIVACAINE HCL (PF) 0.5 % IJ SOLN
INTRAMUSCULAR | Status: AC
  Filled 2022-06-10: qty 30

## 2022-06-10 MED ORDER — HYDROMORPHONE HCL 1 MG/ML IJ SOLN: 0.5000 mg | INTRAMUSCULAR | Status: DC | PRN

## 2022-06-10 MED ORDER — SODIUM CHLORIDE 0.9 % IR SOLN
Status: DC | PRN
  Administered 2022-06-10: 1000 mL
  Administered 2022-06-10: 3000 mL

## 2022-06-10 MED ORDER — POLYETHYLENE GLYCOL 3350 17 G PO PACK: 17.0000 g | PACK | Freq: Every day | ORAL | Status: DC | PRN

## 2022-06-10 MED ORDER — OXYCODONE HCL 5 MG PO TABS
5.0000 mg | ORAL_TABLET | ORAL | Status: DC | PRN
  Administered 2022-06-10 – 2022-06-11 (×5): 10 mg via ORAL
  Filled 2022-06-10 (×5): qty 2

## 2022-06-10 MED ORDER — LORATADINE 10 MG PO TABS
10.0000 mg | ORAL_TABLET | Freq: Every day | ORAL | Status: DC
  Administered 2022-06-11: 10 mg via ORAL
  Filled 2022-06-10: qty 1

## 2022-06-10 MED ORDER — PROPOFOL 1000 MG/100ML IV EMUL
INTRAVENOUS | Status: AC
  Filled 2022-06-10: qty 100

## 2022-06-10 MED ORDER — PHENOL 1.4 % MT LIQD: 1.0000 | OROMUCOSAL | Status: DC | PRN

## 2022-06-10 MED ORDER — OXYCODONE HCL 5 MG PO TABS: 10.0000 mg | ORAL_TABLET | ORAL | Status: DC | PRN

## 2022-06-10 MED ORDER — METOCLOPRAMIDE HCL 5 MG/ML IJ SOLN: 5.0000 mg | Freq: Three times a day (TID) | INTRAMUSCULAR | Status: DC | PRN

## 2022-06-10 MED ORDER — ORAL CARE MOUTH RINSE: 15.0000 mL | Freq: Once | OROMUCOSAL | Status: AC

## 2022-06-10 MED ORDER — ACETAMINOPHEN 500 MG PO TABS
1000.0000 mg | ORAL_TABLET | Freq: Once | ORAL | Status: AC
  Administered 2022-06-10: 1000 mg via ORAL
  Filled 2022-06-10: qty 2

## 2022-06-10 MED ORDER — PHENYLEPHRINE HCL-NACL 20-0.9 MG/250ML-% IV SOLN
INTRAVENOUS | Status: DC | PRN
  Administered 2022-06-10: 25 ug/min via INTRAVENOUS

## 2022-06-10 MED ORDER — METHOCARBAMOL 500 MG IVPB - SIMPLE MED: 500.0000 mg | Freq: Four times a day (QID) | INTRAVENOUS | Status: DC | PRN

## 2022-06-10 MED ORDER — TRANEXAMIC ACID-NACL 1000-0.7 MG/100ML-% IV SOLN
1000.0000 mg | INTRAVENOUS | Status: AC
  Administered 2022-06-10: 1000 mg via INTRAVENOUS
  Filled 2022-06-10: qty 100

## 2022-06-10 MED ORDER — ONDANSETRON HCL 4 MG/2ML IJ SOLN
INTRAMUSCULAR | Status: DC | PRN
  Administered 2022-06-10: 4 mg via INTRAVENOUS

## 2022-06-10 MED ORDER — MECLIZINE HCL 25 MG PO TABS
25.0000 mg | ORAL_TABLET | Freq: Three times a day (TID) | ORAL | Status: DC | PRN
  Administered 2022-06-10: 25 mg via ORAL
  Filled 2022-06-10: qty 1

## 2022-06-10 MED ORDER — SODIUM CHLORIDE (PF) 0.9 % IJ SOLN
INTRAMUSCULAR | Status: AC
  Filled 2022-06-10: qty 50

## 2022-06-10 MED ORDER — LACTATED RINGERS IV SOLN: INTRAVENOUS | Status: DC

## 2022-06-10 MED ORDER — ONDANSETRON HCL 4 MG/2ML IJ SOLN: 4.0000 mg | Freq: Once | INTRAMUSCULAR | Status: DC | PRN

## 2022-06-10 MED ORDER — NITROGLYCERIN 0.4 MG SL SUBL: 0.4000 mg | SUBLINGUAL_TABLET | SUBLINGUAL | Status: DC | PRN

## 2022-06-10 MED ORDER — AMISULPRIDE (ANTIEMETIC) 5 MG/2ML IV SOLN: 10.0000 mg | Freq: Once | INTRAVENOUS | Status: DC | PRN

## 2022-06-10 MED ORDER — POVIDONE-IODINE 10 % EX SWAB: 2.0000 | Freq: Once | CUTANEOUS | Status: DC

## 2022-06-10 MED ORDER — VITAMIN D 25 MCG (1000 UNIT) PO TABS
2000.0000 [IU] | ORAL_TABLET | Freq: Every day | ORAL | Status: DC
  Administered 2022-06-11: 2000 [IU] via ORAL
  Filled 2022-06-10: qty 2

## 2022-06-10 MED ORDER — ACETAMINOPHEN 10 MG/ML IV SOLN: 1000.0000 mg | Freq: Once | INTRAVENOUS | Status: DC | PRN

## 2022-06-10 MED ORDER — SODIUM CHLORIDE 0.9 % IV SOLN: INTRAVENOUS | Status: DC

## 2022-06-10 MED ORDER — PROPOFOL 500 MG/50ML IV EMUL
INTRAVENOUS | Status: DC | PRN
  Administered 2022-06-10: 40 ug/kg/min via INTRAVENOUS

## 2022-06-10 MED ORDER — ONDANSETRON HCL 4 MG/2ML IJ SOLN
INTRAMUSCULAR | Status: AC
  Filled 2022-06-10: qty 2

## 2022-06-10 MED ORDER — KETOROLAC TROMETHAMINE 30 MG/ML IJ SOLN
INTRAMUSCULAR | Status: AC
  Filled 2022-06-10: qty 1

## 2022-06-10 MED ORDER — CEFAZOLIN SODIUM-DEXTROSE 2-4 GM/100ML-% IV SOLN
2.0000 g | Freq: Four times a day (QID) | INTRAVENOUS | Status: AC
  Administered 2022-06-10 (×2): 2 g via INTRAVENOUS
  Filled 2022-06-10 (×2): qty 100

## 2022-06-10 MED ORDER — CLOPIDOGREL BISULFATE 75 MG PO TABS
75.0000 mg | ORAL_TABLET | Freq: Every day | ORAL | Status: DC
  Administered 2022-06-11: 75 mg via ORAL
  Filled 2022-06-10: qty 1

## 2022-06-10 MED ORDER — EZETIMIBE 10 MG PO TABS
10.0000 mg | ORAL_TABLET | Freq: Every day | ORAL | Status: DC
  Administered 2022-06-11: 10 mg via ORAL
  Filled 2022-06-10: qty 1

## 2022-06-10 MED ORDER — RANOLAZINE ER 500 MG PO TB12
1000.0000 mg | ORAL_TABLET | Freq: Two times a day (BID) | ORAL | Status: DC
  Administered 2022-06-10 – 2022-06-11 (×2): 1000 mg via ORAL
  Filled 2022-06-10 (×2): qty 2

## 2022-06-10 MED ORDER — EPINEPHRINE PF 1 MG/ML IJ SOLN
INTRAMUSCULAR | Status: AC
  Filled 2022-06-10: qty 1

## 2022-06-10 MED ORDER — CEFAZOLIN SODIUM-DEXTROSE 2-4 GM/100ML-% IV SOLN
2.0000 g | INTRAVENOUS | Status: AC
  Administered 2022-06-10: 2 g via INTRAVENOUS
  Filled 2022-06-10: qty 100

## 2022-06-10 MED ORDER — ONDANSETRON HCL 4 MG PO TABS: 4.0000 mg | ORAL_TABLET | Freq: Four times a day (QID) | ORAL | Status: DC | PRN

## 2022-06-10 MED ORDER — VITAMIN B-12 1000 MCG PO TABS
2000.0000 ug | ORAL_TABLET | Freq: Every day | ORAL | Status: DC
  Administered 2022-06-11: 2000 ug via ORAL
  Filled 2022-06-10: qty 2

## 2022-06-10 MED ORDER — PANTOPRAZOLE SODIUM 40 MG PO TBEC
40.0000 mg | DELAYED_RELEASE_TABLET | Freq: Every day | ORAL | Status: DC
  Administered 2022-06-11: 40 mg via ORAL
  Filled 2022-06-10: qty 1

## 2022-06-10 MED ORDER — METOCLOPRAMIDE HCL 5 MG PO TABS: 5.0000 mg | ORAL_TABLET | Freq: Three times a day (TID) | ORAL | Status: DC | PRN

## 2022-06-10 MED ORDER — PSEUDOEPHEDRINE HCL ER 120 MG PO TB12
120.0000 mg | ORAL_TABLET | Freq: Two times a day (BID) | ORAL | Status: DC
  Administered 2022-06-10 – 2022-06-11 (×2): 120 mg via ORAL
  Filled 2022-06-10 (×2): qty 1

## 2022-06-10 MED ORDER — CHLORHEXIDINE GLUCONATE 0.12 % MT SOLN
15.0000 mL | Freq: Once | OROMUCOSAL | Status: AC
  Administered 2022-06-10: 15 mL via OROMUCOSAL

## 2022-06-10 MED ORDER — DIPHENHYDRAMINE HCL 12.5 MG/5ML PO ELIX
12.5000 mg | ORAL_SOLUTION | ORAL | Status: DC | PRN
  Administered 2022-06-11: 25 mg via ORAL
  Filled 2022-06-10: qty 10

## 2022-06-10 MED ORDER — DEXAMETHASONE SODIUM PHOSPHATE 10 MG/ML IJ SOLN
INTRAMUSCULAR | Status: AC
  Filled 2022-06-10: qty 1

## 2022-06-10 MED ORDER — ISOPROPYL ALCOHOL 70 % SOLN
Status: DC | PRN
  Administered 2022-06-10: 1 via TOPICAL

## 2022-06-10 MED ORDER — BUPIVACAINE IN DEXTROSE 0.75-8.25 % IT SOLN
INTRATHECAL | Status: DC | PRN
  Administered 2022-06-10: 1.8 mL via INTRATHECAL

## 2022-06-10 MED ORDER — FENTANYL CITRATE PF 50 MCG/ML IJ SOSY
100.0000 ug | PREFILLED_SYRINGE | Freq: Once | INTRAMUSCULAR | Status: DC
  Filled 2022-06-10: qty 2

## 2022-06-10 MED ORDER — ACETAMINOPHEN 500 MG PO TABS
1000.0000 mg | ORAL_TABLET | Freq: Four times a day (QID) | ORAL | Status: DC
  Administered 2022-06-10 – 2022-06-11 (×3): 1000 mg via ORAL
  Filled 2022-06-10 (×3): qty 2

## 2022-06-10 MED ORDER — FENTANYL CITRATE PF 50 MCG/ML IJ SOSY: 25.0000 ug | PREFILLED_SYRINGE | INTRAMUSCULAR | Status: DC | PRN

## 2022-06-10 MED ORDER — MENTHOL 3 MG MT LOZG: 1.0000 | LOZENGE | OROMUCOSAL | Status: DC | PRN

## 2022-06-10 MED ORDER — ASPIRIN 81 MG PO CHEW
81.0000 mg | CHEWABLE_TABLET | Freq: Two times a day (BID) | ORAL | Status: DC
  Administered 2022-06-10 – 2022-06-11 (×2): 81 mg via ORAL
  Filled 2022-06-10 (×2): qty 1

## 2022-06-10 MED ORDER — ALUM & MAG HYDROXIDE-SIMETH 200-200-20 MG/5ML PO SUSP: 30.0000 mL | ORAL | Status: DC | PRN

## 2022-06-10 MED ORDER — ISOSORBIDE MONONITRATE ER 60 MG PO TB24
90.0000 mg | ORAL_TABLET | Freq: Every day | ORAL | Status: DC
  Administered 2022-06-11: 90 mg via ORAL
  Filled 2022-06-10: qty 1

## 2022-06-10 MED ORDER — ROPIVACAINE HCL 5 MG/ML IJ SOLN
INTRAMUSCULAR | Status: DC | PRN
  Administered 2022-06-10: 30 mL via PERINEURAL

## 2022-06-10 MED ORDER — MIDAZOLAM HCL 2 MG/2ML IJ SOLN
2.0000 mg | Freq: Once | INTRAMUSCULAR | Status: AC
  Administered 2022-06-10: 2 mg via INTRAVENOUS
  Filled 2022-06-10: qty 2

## 2022-06-10 MED ORDER — DEXAMETHASONE SODIUM PHOSPHATE 10 MG/ML IJ SOLN
INTRAMUSCULAR | Status: DC | PRN
  Administered 2022-06-10: 8 mg via INTRAVENOUS

## 2022-06-10 MED ORDER — METHOCARBAMOL 500 MG PO TABS
500.0000 mg | ORAL_TABLET | Freq: Four times a day (QID) | ORAL | Status: DC | PRN
  Administered 2022-06-10: 500 mg via ORAL
  Filled 2022-06-10: qty 1

## 2022-06-10 MED ORDER — ATORVASTATIN CALCIUM 40 MG PO TABS
80.0000 mg | ORAL_TABLET | Freq: Every evening | ORAL | Status: DC
  Administered 2022-06-10: 80 mg via ORAL
  Filled 2022-06-10: qty 2

## 2022-06-10 MED ORDER — SENNA 8.6 MG PO TABS
1.0000 | ORAL_TABLET | Freq: Two times a day (BID) | ORAL | Status: DC
  Administered 2022-06-10 – 2022-06-11 (×2): 8.6 mg via ORAL
  Filled 2022-06-10 (×2): qty 1

## 2022-06-10 MED ORDER — DOCUSATE SODIUM 100 MG PO CAPS
100.0000 mg | ORAL_CAPSULE | Freq: Two times a day (BID) | ORAL | Status: DC
  Administered 2022-06-10 – 2022-06-11 (×2): 100 mg via ORAL
  Filled 2022-06-10 (×2): qty 1

## 2022-06-10 MED ORDER — ONDANSETRON HCL 4 MG/2ML IJ SOLN: 4.0000 mg | Freq: Four times a day (QID) | INTRAMUSCULAR | Status: DC | PRN

## 2022-06-10 MED ORDER — ACETAMINOPHEN 325 MG PO TABS: 325.0000 mg | ORAL_TABLET | Freq: Four times a day (QID) | ORAL | Status: DC | PRN

## 2022-06-10 SURGICAL SUPPLY — 73 items
ADH SKN CLS APL DERMABOND .7 (GAUZE/BANDAGES/DRESSINGS) ×2
APL PRP STRL LF DISP 70% ISPRP (MISCELLANEOUS) ×2
BAG COUNTER SPONGE SURGICOUNT (BAG) IMPLANT
BAG SPEC THK2 15X12 ZIP CLS (MISCELLANEOUS)
BAG SPNG CNTER NS LX DISP (BAG)
BAG ZIPLOCK 12X15 (MISCELLANEOUS) IMPLANT
BATTERY INSTRU NAVIGATION (MISCELLANEOUS) ×3 IMPLANT
BLADE SAW RECIPROCATING 77.5 (BLADE) ×1 IMPLANT
BNDG CMPR 5X4 KNIT ELC UNQ LF (GAUZE/BANDAGES/DRESSINGS) ×1
BNDG CMPR 5X62 HK CLSR LF (GAUZE/BANDAGES/DRESSINGS) ×1
BNDG CMPR MED 10X6 ELC LF (GAUZE/BANDAGES/DRESSINGS) ×1
BNDG ELASTIC 4INX 5YD STR LF (GAUZE/BANDAGES/DRESSINGS) ×2 IMPLANT
BNDG ELASTIC 6INX 5YD STR LF (GAUZE/BANDAGES/DRESSINGS) ×1 IMPLANT
BNDG ELASTIC 6X10 VLCR STRL LF (GAUZE/BANDAGES/DRESSINGS) IMPLANT
BTRY SRG DRVR LF (MISCELLANEOUS) ×3
CHLORAPREP W/TINT 26 (MISCELLANEOUS) ×2 IMPLANT
COMP FEM CMTLS PS NRW 11 RT (Joint) ×1 IMPLANT
COMP TIB KNEE PS G 0 RT (Joint) ×1 IMPLANT
COMPONENT FEM CMTS PS NRW 11RT (Joint) IMPLANT
COMPONENT TIB KNEE PS G 0 RT (Joint) IMPLANT
COVER SURGICAL LIGHT HANDLE (MISCELLANEOUS) ×1 IMPLANT
DERMABOND ADVANCED .7 DNX12 (GAUZE/BANDAGES/DRESSINGS) ×2 IMPLANT
DRAPE SHEET LG 3/4 BI-LAMINATE (DRAPES) ×6 IMPLANT
DRAPE U-SHAPE 47X51 STRL (DRAPES) ×2 IMPLANT
DRSG AQUACEL AG ADV 3.5X10 (GAUZE/BANDAGES/DRESSINGS) ×2 IMPLANT
ELECT BLADE TIP CTD 4 INCH (ELECTRODE) ×2 IMPLANT
ELECT REM PT RETURN 15FT ADLT (MISCELLANEOUS) ×2 IMPLANT
GAUZE SPONGE 4X4 12PLY STRL (GAUZE/BANDAGES/DRESSINGS) ×2 IMPLANT
GLOVE BIO SURGEON STRL SZ7 (GLOVE) ×2 IMPLANT
GLOVE BIO SURGEON STRL SZ8.5 (GLOVE) ×4 IMPLANT
GLOVE BIOGEL PI IND STRL 7.5 (GLOVE) ×1 IMPLANT
GLOVE BIOGEL PI IND STRL 8.5 (GLOVE) ×2 IMPLANT
GOWN SPEC L3 XXLG W/TWL (GOWN DISPOSABLE) ×2 IMPLANT
GOWN STRL REUS W/ TWL XL LVL3 (GOWN DISPOSABLE) ×1 IMPLANT
GOWN STRL REUS W/TWL XL LVL3 (GOWN DISPOSABLE) ×1
HANDPIECE INTERPULSE COAX TIP (DISPOSABLE) ×1
HDLS TROCR DRIL PIN KNEE 75 (PIN) ×1
HOLDER FOLEY CATH W/STRAP (MISCELLANEOUS) ×2 IMPLANT
HOOD PEEL AWAY T7 (MISCELLANEOUS) ×3 IMPLANT
INSERT TIB ASF PS 7-12 10 GH (Insert) IMPLANT
KIT TURNOVER KIT A (KITS) IMPLANT
MARKER SKIN DUAL TIP RULER LAB (MISCELLANEOUS) ×1 IMPLANT
NDL SAFETY ECLIP 18X1.5 (MISCELLANEOUS) ×1 IMPLANT
NDL SPNL 18GX3.5 QUINCKE PK (NEEDLE) ×2 IMPLANT
NEEDLE SPNL 18GX3.5 QUINCKE PK (NEEDLE) ×1 IMPLANT
NS IRRIG 1000ML POUR BTL (IV SOLUTION) ×2 IMPLANT
PACK TOTAL KNEE CUSTOM (KITS) ×1 IMPLANT
PADDING CAST ABS COTTON 6X4 NS (CAST SUPPLIES) IMPLANT
PADDING CAST COTTON 6X4 STRL (CAST SUPPLIES) ×2 IMPLANT
PATELLA STD SZ 38X10 (Miscellaneous) IMPLANT
PIN DRILL HDLS TROCAR 75 4PK (PIN) IMPLANT
PROTECTOR NERVE ULNAR (MISCELLANEOUS) ×1 IMPLANT
SAW OSC TIP CART 19.5X105X1.3 (SAW) ×2 IMPLANT
SCREW FEMALE HEX FIX 25X2.5 (ORTHOPEDIC DISPOSABLE SUPPLIES) IMPLANT
SEALER BIPOLAR AQUA 6.0 (INSTRUMENTS) ×2 IMPLANT
SET HNDPC FAN SPRY TIP SCT (DISPOSABLE) ×1 IMPLANT
SET PAD KNEE POSITIONER (MISCELLANEOUS) ×1 IMPLANT
SOLUTION PRONTOSAN WOUND 350ML (IRRIGATION / IRRIGATOR) IMPLANT
SPIKE FLUID TRANSFER (MISCELLANEOUS) ×4 IMPLANT
SUT MNCRL AB 3-0 PS2 18 (SUTURE) ×2 IMPLANT
SUT MON AB 2-0 CT1 36 (SUTURE) ×1 IMPLANT
SUT STRATAFIX PDO 1 14 VIOLET (SUTURE) ×1
SUT STRATFX PDO 1 14 VIOLET (SUTURE) ×1
SUT VIC AB 1 CTX 36 (SUTURE) ×2
SUT VIC AB 1 CTX36XBRD ANBCTR (SUTURE) ×2 IMPLANT
SUT VIC AB 2-0 CT1 27 (SUTURE) ×1
SUT VIC AB 2-0 CT1 TAPERPNT 27 (SUTURE) ×2 IMPLANT
SUTURE STRATFX PDO 1 14 VIOLET (SUTURE) ×2 IMPLANT
SYR 3ML LL SCALE MARK (SYRINGE) ×1 IMPLANT
TRAY FOLEY MTR SLVR 16FR STAT (SET/KITS/TRAYS/PACK) IMPLANT
TUBE SUCTION HIGH CAP CLEAR NV (SUCTIONS) ×1 IMPLANT
WATER STERILE IRR 1000ML POUR (IV SOLUTION) ×4 IMPLANT
WRAP KNEE MAXI GEL POST OP (GAUZE/BANDAGES/DRESSINGS) IMPLANT

## 2022-06-10 NOTE — H&P (View-Only) (Signed)
TOTAL KNEE ADMISSION H&P  Patient is being admitted for right total knee arthroplasty.  Subjective:  Chief Complaint:right knee pain.  HPI: Bill Johnson, 67 y.o. male, has a history of pain and functional disability in the right knee due to arthritis and has failed non-surgical conservative treatments for greater than 12 weeks to includeNSAID's and/or analgesics, corticosteriod injections, viscosupplementation injections, flexibility and strengthening excercises, use of assistive devices, and activity modification.  Onset of symptoms was gradual, starting 10 years ago with rapidlly worsening course since that time. The patient noted no past surgery on the right knee(s).  Patient currently rates pain in the right knee(s) at 10 out of 10 with activity. Patient has night pain, worsening of pain with activity and weight bearing, pain that interferes with activities of daily living, pain with passive range of motion, crepitus, and joint swelling.  Patient has evidence of subchondral cysts, subchondral sclerosis, periarticular osteophytes, and joint space narrowing by imaging studies. There is no active infection.  Patient Active Problem List   Diagnosis Date Noted   Coronary artery disease of native artery of native heart with stable angina pectoris (HCC)    Encounter for general adult medical examination with abnormal findings 12/23/2020   Prostate cancer screening 12/23/2020   Aortic atherosclerosis (HCC) 10/23/2020   Pulmonary nodule 09/20/2020   Unstable angina (HCC) 04/23/2020   Tubulovillous adenoma of colon 01/11/2020   Status post coronary artery stent placement 01/04/2020   Tubulovillous adenoma of rectum 10/26/2019   CAD, multiple vessel 07/06/2019   Alcohol abuse 03/27/2019   Angina pectoris (HCC) 03/19/2019   Other spondylosis, cervical region 09/06/2018   Hyperlipemia 07/26/2018   GERD (gastroesophageal reflux disease) 06/24/2018   OA (osteoarthritis) of shoulder 06/28/2017    B12 deficiency 07/01/2016   Essential hypertension 07/01/2016   COPD GOLD 0  07/01/2016   GAD (generalized anxiety disorder) 07/01/2016   Vertigo 11/28/2014   Hearing loss 11/28/2014   Tremor 11/28/2014   Past Medical History:  Diagnosis Date   Alcohol abuse    Allergy    seasonal   Anginal pain (HCC)    Anxiety    CAD (coronary artery disease)    a. 06/2019 cath - s/p successful PCI of the ostial to mid LAD with IVUS guidance and DES x 2.   COPD (chronic obstructive pulmonary disease) (HCC)    Essential hypertension    GERD (gastroesophageal reflux disease)    HOH (hard of hearing)    Hyperlipidemia    Hypertension    OA (osteoarthritis) of knee    Pulmonary nodules    a. seen on coronary CT 05/2019, will need OP f/u.   Rotator cuff tear arthropathy of both shoulders    On DISABILITY, non operative case   Shoulder pain    Tobacco abuse    Vertigo     Past Surgical History:  Procedure Laterality Date   CARDIAC CATHETERIZATION  06/2005   no significant CAD   CATARACT EXTRACTION Left    COLONOSCOPY WITH PROPOFOL N/A 05/01/2019   Procedure: COLONOSCOPY WITH PROPOFOL;  Surgeon: Corbin Ade, MD;  [facility]: [facility];  Service: Endoscopy;  Laterality: N/A;  7:30am   COLONOSCOPY WITH PROPOFOL N/A 03/07/2020   Procedure: COLONOSCOPY WITH PROPOFOL;  Surgeon: Corbin Ade, MD;  Location: [facility];  Service: Endoscopy;  Laterality: N/A;  8:30am   CORONARY PRESSURE/FFR STUDY N/A 07/05/2019   Procedure: INTRAVASCULAR PRESSURE WIRE/FFR STUDY;  Surgeon: Swaziland, Peter M, MD;  Location: [facility] INVASIVE  CV LAB;  Service: Cardiovascular;  Laterality: N/A;   CORONARY STENT INTERVENTION N/A 07/05/2019   Procedure: CORONARY STENT INTERVENTION;  Surgeon: Swaziland, Peter M, MD;  Location: [facility] INVASIVE CV LAB;  Service: Cardiovascular;  Laterality: N/A;   CORONARY STENT INTERVENTION N/A 07/20/2019   Procedure: CORONARY STENT INTERVENTION;  Surgeon: Corky Crafts, MD;  Location: [facility]  INVASIVE CV LAB;  Service: Cardiovascular;  Laterality: N/A;   CORONARY ULTRASOUND/IVUS N/A 07/05/2019   Procedure: Intravascular Ultrasound/IVUS;  Surgeon: Swaziland, Peter M, MD;  Location: [facility] INVASIVE CV LAB;  Service: Cardiovascular;  Laterality: N/A;   ESOPHAGOGASTRODUODENOSCOPY  08/2005   Dr. Marina Goodell: small hiatal hernia   HERNIA REPAIR  1983   LEFT HEART CATH AND CORONARY ANGIOGRAPHY N/A 07/05/2019   Procedure: LEFT HEART CATH AND CORONARY ANGIOGRAPHY;  Surgeon: Swaziland, Peter M, MD;  Location: [facility] INVASIVE CV LAB;  Service: Cardiovascular;  Laterality: N/A;   LEFT HEART CATH AND CORONARY ANGIOGRAPHY N/A 07/20/2019   Procedure: LEFT HEART CATH AND CORONARY ANGIOGRAPHY;  Surgeon: Corky Crafts, MD;  Location: [facility] INVASIVE CV LAB;  Service: Cardiovascular;  Laterality: N/A;   LEFT HEART CATH AND CORONARY ANGIOGRAPHY N/A 04/24/2020   Procedure: LEFT HEART CATH AND CORONARY ANGIOGRAPHY;  Surgeon: Lyn Records, MD;  Location: [facility] INVASIVE CV LAB;  Service: Cardiovascular;  Laterality: N/A;   LEFT HEART CATH AND CORONARY ANGIOGRAPHY N/A 01/02/2021   Procedure: LEFT HEART CATH AND CORONARY ANGIOGRAPHY;  Surgeon: Kathleene Hazel, MD;  Location: [facility] INVASIVE CV LAB;  Service: Cardiovascular;  Laterality: N/A;   POLYPECTOMY  05/01/2019   Procedure: POLYPECTOMY;  Surgeon: Corbin Ade, MD;  Location: [facility];  Service: Endoscopy;;   POLYPECTOMY  03/07/2020   Procedure: POLYPECTOMY;  Surgeon: Corbin Ade, MD;  Location: [facility];  Service: Endoscopy;;   ROTATOR CUFF REPAIR Bilateral    TONSILLECTOMY  1977    Current Outpatient Medications  Medication Sig Dispense Refill Last Dose   acetaminophen (TYLENOL) 500 MG tablet Take 1,000 mg by mouth as needed for mild pain or headache.      albuterol (VENTOLIN HFA) 108 (90 Base) MCG/ACT inhaler Inhale 2 puffs into the lungs every 6 (six) hours as needed for wheezing or shortness of breath. (Patient not taking: Reported on  06/03/2022) 1 each 2    aspirin 81 MG tablet Take 81 mg by mouth daily.      atorvastatin (LIPITOR) 80 MG tablet Take 1 tablet (80 mg total) by mouth every evening. 90 tablet 2    cetirizine (ZYRTEC) 10 MG tablet Take 10 mg by mouth daily.      Cholecalciferol (VITAMIN D3) 50 MCG (2000 UT) CAPS Take 2,000 Units by mouth daily.      clopidogrel (PLAVIX) 75 MG tablet Take 1 tablet (75 mg total) by mouth daily. 90 tablet 3    CVS VITAMIN B-12 2000 MCG TBCR Take 2,000 mcg by mouth daily.      diazepam (VALIUM) 5 MG tablet Take 1 tablet (5 mg total) by mouth every 12 (twelve) hours as needed for anxiety. 45 tablet 2    Evolocumab (REPATHA SURECLICK) 140 MG/ML SOAJ Inject 140 mg into the skin every 14 (fourteen) days. 6 mL 3    ezetimibe (ZETIA) 10 MG tablet Take 1 tablet (10 mg total) by mouth daily. 90 tablet 3    HYDROcodone-acetaminophen (NORCO/VICODIN) 5-325 MG tablet Take 0.5-1 tablets by mouth as needed for moderate pain or severe pain.  isosorbide mononitrate (IMDUR) 60 MG 24 hr tablet Take 1.5 tablets (90 mg total) by mouth daily. 135 tablet 3    meclizine (ANTIVERT) 25 MG tablet TAKE 1 TABLET BY MOUTH THREE TIMES DAILY AS NEEDED FOR DIZZINESS (Patient taking differently: Take 25 mg by mouth 3 (three) times daily as needed for dizziness or nausea.) 90 tablet 2    nitroGLYCERIN (NITROSTAT) 0.4 MG SL tablet Place 1 tablet (0.4 mg total) under the tongue every 5 (five) minutes as needed for chest pain (up to 3 doses). 25 tablet 3    pantoprazole (PROTONIX) 40 MG tablet Take 1 tablet (40 mg total) by mouth 2 (two) times daily. (Patient taking differently: Take 40 mg by mouth daily.) 180 tablet 2    ranolazine (RANEXA) 1000 MG SR tablet Take 1 tablet (1,000 mg total) by mouth 2 (two) times daily. 180 tablet 2    No current facility-administered medications for this visit.   No Known Allergies  Social History   Tobacco Use   Smoking status: Former    Packs/day: 1.00    Years: 43.00     Additional pack years: 0.00    Total pack years: 43.00    Types: Cigarettes    Start date: 04/15/1970    Quit date: 07/26/2020    Years since quitting: 1.8   Smokeless tobacco: Never  Substance Use Topics   Alcohol use: Not Currently    Comment: Drinks 3-4 beers/day     Family History  Problem Relation Age of Onset   Heart disease Mother    Diabetes Mother    Hypertension Mother    Heart disease Father    Hypertension Father    Stroke Other    Cancer Other    Cancer Brother        prostate   Colon cancer Neg Hx      Review of Systems  Musculoskeletal:  Positive for arthralgias, gait problem and joint swelling.  All other systems reviewed and are negative.   Objective:  Physical Exam Constitutional:      Appearance: Normal appearance.  HENT:     Head: Normocephalic and atraumatic.     Nose: Nose normal.     Mouth/Throat:     Mouth: Mucous membranes are moist.     Pharynx: Oropharynx is clear.  Eyes:     Conjunctiva/sclera: Conjunctivae normal.  Cardiovascular:     Rate and Rhythm: Normal rate and regular rhythm.     Pulses: Normal pulses.     Heart sounds: Normal heart sounds.  Pulmonary:     Effort: Pulmonary effort is normal.     Breath sounds: Normal breath sounds.  Abdominal:     General: Abdomen is flat.     Palpations: Abdomen is soft.  Genitourinary:    Comments: deferred Musculoskeletal:     Cervical back: Normal range of motion and neck supple.     Comments: Examination the right knee reveals no skin wounds or lesions. He has swelling, trace effusion. No warmth or erythema. Varus deformity. Tenderness to palpation medial joint line, lateral joint line, peripatellar retinacular tissues with a positive grind sign. Range of motion 15 to 120 degrees without any ligamentous instability. Painless range of motion the hip  Distally, there is no focal motor or sensory deficit. Has palpable pedal pulse.  Ambulates with an antalgic gait.  Skin:    General:  Skin is warm and dry.     Capillary Refill: Capillary refill takes less than 2 seconds.  Neurological:     General: No focal deficit present.     Mental Status: He is alert and oriented to person, place, and time.  Psychiatric:        Mood and Affect: Mood normal.        Behavior: Behavior normal.        Thought Content: Thought content normal.        Judgment: Judgment normal.     Vital signs in last 24 hours: @VSRANGES @  Labs:   Estimated body mass index is 28.41 kg/m as calculated from the following:   Height as of 06/03/22: 5\' 6"  (1.676 m).   Weight as of 06/03/22: 79.8 kg.   Imaging Review Plain radiographs demonstrate severe degenerative joint disease of the right knee(s). The overall alignment issignificant varus. The bone quality appears to be adequate for age and reported activity level.      Assessment/Plan:  End stage arthritis, right knee   The patient history, physical examination, clinical judgment of the provider and imaging studies are consistent with end stage degenerative joint disease of the right knee(s) and total knee arthroplasty is deemed medically necessary. The treatment options including medical management, injection therapy arthroscopy and arthroplasty were discussed at length. The risks and benefits of total knee arthroplasty were presented and reviewed. The risks due to aseptic loosening, infection, stiffness, patella tracking problems, thromboembolic complications and other imponderables were discussed. The patient acknowledged the explanation, agreed to proceed with the plan and consent was signed. Patient is being admitted for inpatient treatment for surgery, pain control, PT, OT, prophylactic antibiotics, VTE prophylaxis, progressive ambulation and ADL's and discharge planning. The patient is planning to be discharged home with OPPT after an overnight stay.   Therapy Plans: outpatient therapy. [facility] [place] [facility]st PT appt to be scheduled  06/15/22. Disposition: Home with Daughter Planned DVT Prophylaxis: aspirin 81mg  BID and plavix DME needed: Has rolling walker.  PCP: Cleared.  Cardiology:  TXA: IV  Allergies: NDKA.  Anesthesia Concerns: None.  BMI: 29 Last HgbA1c: 5.9.  Other: - CAD, s/p PCI 2021. Aspirin and plavix.  - Vertigo history on meclizine  - Oxycodone, zofran.  - 06/03/22: Hgb 13.6, K+ 4.6, Cr. 0.79.     Patient's anticipated LOS is less than 2 midnights, meeting these requirements: - Younger than 8 - Lives within 1 hour of care - Has a competent adult at home to recover with post-op recover - NO history of  - Chronic pain requiring opiods  - Diabetes  - Coronary Artery Disease  - Heart failure  - Heart attack  - Stroke  - DVT/VTE  - Cardiac arrhythmia  - Respiratory Failure/COPD  - Renal failure  - Anemia  - Advanced Liver disease

## 2022-06-10 NOTE — Anesthesia Postprocedure Evaluation (Signed)
Anesthesia Post Note  Patient: Calin L Cumpston  Procedure(s) Performed: COMPUTER ASSISTED TOTAL KNEE ARTHROPLASTY (Right: Knee)     Patient location during evaluation: PACU Anesthesia Type: Regional and Spinal Level of consciousness: awake Pain management: pain level controlled Vital Signs Assessment: post-procedure vital signs reviewed and stable Respiratory status: spontaneous breathing, nonlabored ventilation and respiratory function stable Cardiovascular status: blood pressure returned to baseline and stable Postop Assessment: no apparent nausea or vomiting Anesthetic complications: no   No notable events documented.  Last Vitals:  Vitals:   06/10/22 1515 06/10/22 1600  BP: 105/73 112/69  Pulse: 65   Resp: 17 18  Temp: 36.9 C 36.9 C  SpO2: 97%     Last Pain:  Vitals:   06/10/22 1600  TempSrc:   PainSc: 7                  Avinash Maltos P Carlotta Telfair

## 2022-06-10 NOTE — Anesthesia Procedure Notes (Signed)
Anesthesia Regional Block: Adductor canal block   Pre-Anesthetic Checklist: , timeout performed,  Correct Patient, Correct Site, Correct Laterality,  Correct Procedure,, site marked,  Risks and benefits discussed,  Surgical consent,  Pre-op evaluation,  At surgeon's request and post-op pain management  Laterality: Right  Prep: chloraprep       Needles:  Injection technique: Single-shot  Needle Type: Echogenic Stimulator Needle     Needle Length: 10cm  Needle Gauge: 20     Additional Needles:   Procedures:,,,, ultrasound used (permanent image in chart),,    Narrative:  Start time: 06/10/2022 10:15 AM End time: 06/10/2022 10:25 AM Injection made incrementally with aspirations every 5 mL.  Performed by: Personally  Anesthesiologist: Leonides Grills, MD  Additional Notes: Functioning IV was confirmed and monitors were applied. A time-out was performed. Hand hygiene and sterile gloves were used. The thigh was placed in a frog-leg position and prepped in a sterile fashion. A 20ga Bbraun echogenic stimulator needle was placed using ultrasound guidance.  Negative aspiration and negative test dose prior to incremental administration of local anesthetic. The patient tolerated the procedure well.

## 2022-06-10 NOTE — Anesthesia Procedure Notes (Signed)
Procedure Name: MAC Date/Time: 06/10/2022 12:02 PM  Performed by: Elisabeth Cara, CRNAPre-anesthesia Checklist: Patient identified, Emergency Drugs available, Suction available, Patient being monitored and Timeout performed Patient Re-evaluated:Patient Re-evaluated prior to induction Oxygen Delivery Method: Simple face mask Placement Confirmation: positive ETCO2 Dental Injury: Teeth and Oropharynx as per pre-operative assessment

## 2022-06-10 NOTE — Evaluation (Signed)
Physical Therapy Evaluation Patient Details Name: Bill Johnson MRN: [id number] DOB: 1955-04-18 Today's Date: 06/10/2022  History of Present Illness  67 yo male presents to therapy s/p R TKA on 06/10/2022 due to failure of conservative measures. Pt PMH includes but is not limited to: CAD s/p stent placement, cervical spondylosis, HDL, GERD, HTN, COPD, vertigo, HOH, B RTC repair and tremors.  Clinical Impression      Bill Johnson is a 67 y.o. male POD 0 s/p R TKA. Patient reports IND with mobility at baseline. Patient is now limited by functional impairments (see PT problem list below) and requires min guard for bed mobility and min guard and cues for transfers. Patient was able to ambulate 50 feet with RW and min guard level of assist. Patient instructed in exercise to facilitate ROM and circulation to manage edema. Patient will benefit from continued skilled PT interventions to address impairments and progress towards PLOF. Acute PT will follow to progress mobility and stair training in preparation for safe discharge home with family support and OPPT starting 06/15/2022.     Recommendations for follow up therapy are one component of a multi-disciplinary discharge planning process, led by the attending physician.  Recommendations may be updated based on patient status, additional functional criteria and insurance authorization.  Follow Up Recommendations       Assistance Recommended at Discharge Intermittent Supervision/Assistance  Patient can return home with the following  A little help with walking and/or transfers;A little help with bathing/dressing/bathroom;Assistance with cooking/housework;Assist for transportation;Help with stairs or ramp for entrance    Equipment Recommendations None recommended by PT  Recommendations for Other Services       Functional Status Assessment Patient has had a recent decline in their functional status and demonstrates the ability to make  significant improvements in function in a reasonable and predictable amount of time.     Precautions / Restrictions Precautions Precautions: Knee;Fall Restrictions Weight Bearing Restrictions: No      Mobility  Bed Mobility Overal bed mobility: Needs Assistance Bed Mobility: Supine to Sit     Supine to sit: Min guard     General bed mobility comments: increased time, cues and HOB elevated    Transfers Overall transfer level: Needs assistance Equipment used: Rolling walker (2 wheels) Transfers: Sit to/from Stand Sit to Stand: Min guard           General transfer comment: cues for proper UE placement    Ambulation/Gait Ambulation/Gait assistance: Min guard Gait Distance (Feet): 50 Feet Assistive device: Rolling walker (2 wheels) Gait Pattern/deviations: Step-to pattern, Antalgic, Trunk flexed Gait velocity: decreased        Stairs            Wheelchair Mobility    Modified Rankin (Stroke Patients Only)       Balance Overall balance assessment: Needs assistance Sitting-balance support: Feet supported Sitting balance-Leahy Scale: Good     Standing balance support: Bilateral upper extremity supported, During functional activity, Reliant on assistive device for balance Standing balance-Leahy Scale: Poor                               Pertinent Vitals/Pain Pain Assessment Pain Assessment: 0-10 Pain Score: 4  Pain Location: R LE Pain Descriptors / Indicators: Aching, Discomfort, Operative site guarding Pain Intervention(s): Limited activity within patient's tolerance, Monitored during session, Premedicated before session, Repositioned, Ice applied    Home Living Family/patient expects to be discharged  to:: Private residence Living Arrangements: Alone Available Help at Discharge: Family (pt is going to daughters home per Ortho notes) Type of Home: House Home Access: Stairs to enter Entrance Stairs-Rails: None Entrance  Stairs-Number of Steps: 1 (4 inch threshold)   Home Layout: One level Home Equipment: Agricultural consultant (2 wheels);Cane - single point      Prior Function Prior Level of Function : Independent/Modified Independent;Driving             Mobility Comments: IND with all ADLs, self care tasks, IADLs, yard work and driving       Higher education careers adviser        Extremity/Trunk Assessment   Upper Extremity Assessment Upper Extremity Assessment: RUE deficits/detail;LUE deficits/detail (hx of B shoulder sx, L shoulder dysfunction is pronounced)    Lower Extremity Assessment Lower Extremity Assessment: RLE deficits/detail RLE Deficits / Details: ankle DF/PF 5/5; SLR < 10 degree lag RLE Sensation: WNL    Cervical / Trunk Assessment Cervical / Trunk Assessment: Other exceptions (reports cervical bone spurs)  Communication   Communication: HOH (B hearing aids; speak into L ear)  Cognition Arousal/Alertness: Awake/alert Behavior During Therapy: WFL for tasks assessed/performed Overall Cognitive Status: Within Functional Limits for tasks assessed                                          General Comments      Exercises Total Joint Exercises Ankle Circles/Pumps: AROM, Both, 20 reps   Assessment/Plan    PT Assessment Patient needs continued PT services  PT Problem List Decreased strength;Decreased range of motion;Decreased activity tolerance;Decreased balance;Decreased mobility;Decreased coordination;Pain       PT Treatment Interventions DME instruction;Gait training;Stair training;Functional mobility training;Therapeutic activities;Therapeutic exercise;Balance training;Neuromuscular re-education;Patient/family education;Modalities    PT Goals (Current goals can be found in the Care Plan section)  Acute Rehab PT Goals Patient Stated Goal: to get stronger and go home PT Goal Formulation: With patient Time For Goal Achievement: 06/24/22 Potential to Achieve Goals:  Good    Frequency 7X/week     Co-evaluation               AM-PAC PT "6 Clicks" Mobility  Outcome Measure Help needed turning from your back to your side while in a flat bed without using bedrails?: A Little Help needed moving from lying on your back to sitting on the side of a flat bed without using bedrails?: A Little Help needed moving to and from a bed to a chair (including a wheelchair)?: A Little Help needed standing up from a chair using your arms (e.g., wheelchair or bedside chair)?: A Little Help needed to walk in hospital room?: A Little Help needed climbing 3-5 steps with a railing? : A Lot 6 Click Score: 17    End of Session         PT Visit Diagnosis: Unsteadiness on feet (R26.81);Other abnormalities of gait and mobility (R26.89);Muscle weakness (generalized) (M62.81);Pain Pain - Right/Left: Right Pain - part of body: Knee    Time: 1191-4782 PT Time Calculation (min) (ACUTE ONLY): 32 min   Charges:   PT Evaluation $PT Eval Low Complexity: 1 Low PT Treatments $Gait Training: 8-22 mins        Johnny Bridge, PT Acute Rehab   Jacqualyn Posey 06/10/2022, 6:48 PM

## 2022-06-10 NOTE — Care Plan (Signed)
Ortho Bundle Case Management Note  Patient Details  Name: Bill Johnson MRN: [id number] Date of Birth: Feb 01, 1955                  R TKA on 06-10-22  DCP: Home with dtr, [name] (cell for dtr for 1st 2 wks p/o is [phone])  DME: No needs, has a RW  PT: EO [place] on 06-16-22   DME Arranged:  N/A DME Agency:       Additional Comments: Please contact me with any questions of if this plan should need to change.   Ennis Forts, RN,CCM [facility]  [phone] 06/10/2022, 9:27 AM

## 2022-06-10 NOTE — Discharge Instructions (Signed)
 Dr. Brian Swinteck Total Joint Specialist [facility] [address] , [place] [address] [phone]  TOTAL KNEE REPLACEMENT POSTOPERATIVE DIRECTIONS    Knee Rehabilitation, Guidelines Following Surgery  Results after knee surgery are often greatly improved when you follow the exercise, range of motion and muscle strengthening exercises prescribed by your doctor. Safety measures are also important to protect the knee from further injury. Any time any of these exercises cause you to have increased pain or swelling in your knee joint, decrease the amount until you are comfortable again and slowly increase them. If you have problems or questions, call your caregiver or physical therapist for advice.   WEIGHT BEARING Weight bearing as tolerated with assist device (walker, cane, etc) as directed, use it as long as suggested by your surgeon or therapist, typically at least 4-6 weeks.  HOME CARE INSTRUCTIONS  Remove items at home which could result in a fall. This includes throw rugs or furniture in walking pathways.  Continue medications as instructed at time of discharge. You may have some home medications which will be placed on hold until you complete the course of blood thinner medication.  You may start showering once you are discharged home but do not submerge the incision under water. Just pat the incision dry and apply a dry gauze dressing on daily. Walk with walker as instructed.  You may resume a sexual relationship in one month or when given the OK by your doctor.  Use walker as long as suggested by your caregivers. Avoid periods of inactivity such as sitting longer than an hour when not asleep. This helps prevent blood clots.  You may put full weight on your legs and walk as much as is comfortable.  You may return to work once you are cleared by your doctor.  Do not drive a car for 6 weeks or until released by you surgeon.  Do not drive while  taking narcotics.  Wear the elastic stockings for three weeks following surgery during the day but you may remove then at night. Make sure you keep all of your appointments after your operation with all of your doctors and caregivers. You should call the office at the above phone number and make an appointment for approximately two weeks after the date of your surgery. Do not remove your surgical dressing. The dressing is waterproof; you may take showers in 3 days, but do not take tub baths or submerge the dressing. Please pick up a stool softener and laxative for home use as long as you are requiring pain medications. ICE to the affected knee every three hours for 30 minutes at a time and then as needed for pain and swelling.  Continue to use ice on the knee for pain and swelling from surgery. You may notice swelling that will progress down to the foot and ankle.  This is normal after surgery.  Elevate the leg when you are not up walking on it.   It is important for you to complete the blood thinner medication as prescribed by your doctor. Continue to use the breathing machine which will help keep your temperature down.  It is common for your temperature to cycle up and down following surgery, especially at night when you are not up moving around and exerting yourself.  The breathing machine keeps your lungs expanded and your temperature down.  RANGE OF MOTION AND STRENGTHENING EXERCISES  Rehabilitation of the knee is important following a knee injury or an   operation. After just a few days of immobilization, the muscles of the thigh which control the knee become weakened and shrink (atrophy). Knee exercises are designed to build up the tone and strength of the thigh muscles and to improve knee motion. Often times heat used for twenty to thirty minutes before working out will loosen up your tissues and help with improving the range of motion but do not use heat for the first two weeks following surgery.  These exercises can be done on a training (exercise) mat, on the floor, on a table or on a bed. Use what ever works the best and is most comfortable for you Knee exercises include:  Leg Lifts - While your knee is still immobilized in a splint or cast, you can do straight leg raises. Lift the leg to 60 degrees, hold for 3 sec, and slowly lower the leg. Repeat 10-20 times 2-3 times daily. Perform this exercise against resistance later as your knee gets better.  Quad and Hamstring Sets - Tighten up the muscle on the front of the thigh (Quad) and hold for 5-10 sec. Repeat this 10-20 times hourly. Hamstring sets are done by pushing the foot backward against an object and holding for 5-10 sec. Repeat as with quad sets.  A rehabilitation program following serious knee injuries can speed recovery and prevent re-injury in the future due to weakened muscles. Contact your doctor or a physical therapist for more information on knee rehabilitation.   POST-OPERATIVE OPIOID TAPER INSTRUCTIONS: It is important to wean off of your opioid medication as soon as possible. If you do not need pain medication after your surgery it is ok to stop day one. Opioids include: Codeine, Hydrocodone(Norco, Vicodin), Oxycodone(Percocet, oxycontin) and hydromorphone amongst others.  Long term and even short term use of opiods can cause: Increased pain response Dependence Constipation Depression Respiratory depression And more.  Withdrawal symptoms can include Flu like symptoms Nausea, vomiting And more Techniques to manage these symptoms Hydrate well Eat regular healthy meals Stay active Use relaxation techniques(deep breathing, meditating, yoga) Do Not substitute Alcohol to help with tapering If you have been on opioids for less than two weeks and do not have pain than it is ok to stop all together.  Plan to wean off of opioids This plan should start within one week post op of your joint replacement. Maintain the same  interval or time between taking each dose and first decrease the dose.  Cut the total daily intake of opioids by one tablet each day Next start to increase the time between doses. The last dose that should be eliminated is the evening dose.    SKILLED REHAB INSTRUCTIONS: If the patient is transferred to a skilled rehab facility following release from the hospital, a list of the current medications will be sent to the facility for the patient to continue.  When discharged from the skilled rehab facility, please have the facility set up the patient's Home Health Physical Therapy prior to being released. Also, the skilled facility will be responsible for providing the patient with their medications at time of release from the facility to include their pain medication, the muscle relaxants, and their blood thinner medication. If the patient is still at the rehab facility at time of the two week follow up appointment, the skilled rehab facility will also need to assist the patient in arranging follow up appointment in our office and any transportation needs.  MAKE SURE YOU:  Understand these instructions.  Will watch   your condition.  Will get help right away if you are not doing well or get worse.    Pick up stool softner and laxative for home use following surgery while on pain medications. Do NOT remove your dressing. You may shower.  Do not take tub baths or submerge incision under water. May shower starting three days after surgery. Please use a clean towel to pat the incision dry following showers. Continue to use ice for pain and swelling after surgery. Do not use any lotions or creams on the incision until instructed by your surgeon.  

## 2022-06-10 NOTE — Plan of Care (Signed)
  Problem: Activity: Goal: Ability to avoid complications of mobility impairment will improve Outcome: Progressing Goal: Range of joint motion will improve Outcome: Progressing   Problem: Pain Management: Goal: Pain level will decrease with appropriate interventions Outcome: Progressing   Problem: Safety: Goal: Ability to remain free from injury will improve Outcome: Progressing   

## 2022-06-10 NOTE — Transfer of Care (Signed)
Immediate Anesthesia Transfer of Care Note  Patient: Bill Johnson  Procedure(s) Performed: COMPUTER ASSISTED TOTAL KNEE ARTHROPLASTY (Right: Knee)  Patient Location: PACU  Anesthesia Type:MAC and Spinal  Level of Consciousness: awake, alert , oriented, and patient cooperative  Airway & Oxygen Therapy: Patient Spontanous Breathing and Patient connected to face mask oxygen  Post-op Assessment: Report given to RN, Post -op Vital signs reviewed and stable, and Patient moving all extremities  Post vital signs: Reviewed and stable  Last Vitals:  Vitals Value Taken Time  BP 112/69 06/10/22 1430  Temp    Pulse 81 06/10/22 1433  Resp 22 06/10/22 1433  SpO2 98 % 06/10/22 1433  Vitals shown include unvalidated device data.  Last Pain:  Vitals:   06/10/22 1050  TempSrc:   PainSc: 0-No pain         Complications: No notable events documented.

## 2022-06-10 NOTE — Interval H&P Note (Signed)
History and Physical Interval Note:  06/10/2022 10:54 AM  Bill Johnson  has presented today for surgery, with the diagnosis of Right knee osteoarthritis.  The various methods of treatment have been discussed with the patient and family. After consideration of risks, benefits and other options for treatment, the patient has consented to  Procedure(s) with comments: COMPUTER ASSISTED TOTAL KNEE ARTHROPLASTY (Right) - 150 as a surgical intervention.  The patient's history has been reviewed, patient examined, no change in status, stable for surgery.  I have reviewed the patient's chart and labs.  Questions were answered to the patient's satisfaction.     [name]

## 2022-06-10 NOTE — Anesthesia Procedure Notes (Signed)
Spinal  Patient location during procedure: OR Start time: 06/10/2022 12:03 PM End time: 06/10/2022 12:08 PM Reason for block: surgical anesthesia Staffing Performed: anesthesiologist  Anesthesiologist: Leonides Grills, MD Performed by: Leonides Grills, MD Authorized by: Leonides Grills, MD   Preanesthetic Checklist Completed: patient identified, IV checked, risks and benefits discussed, surgical consent, monitors and equipment checked, pre-op evaluation and timeout performed Spinal Block Patient position: sitting Prep: DuraPrep Patient monitoring: cardiac monitor, continuous pulse ox and blood pressure Approach: midline Location: L4-5 Injection technique: single-shot Needle Needle type: Pencan  Needle gauge: 24 G Needle length: 9 cm Assessment Sensory level: T10 Events: CSF return Additional Notes Functioning IV was confirmed and monitors were applied. Sterile prep and drape, including hand hygiene and sterile gloves were used. The patient was positioned and the spine was prepped. The skin was anesthetized with lidocaine.  Free flow of clear CSF was obtained prior to injecting local anesthetic into the CSF.  The spinal needle aspirated freely following injection.  The needle was carefully withdrawn.  The patient tolerated the procedure well.

## 2022-06-10 NOTE — Op Note (Signed)
OPERATIVE REPORT  SURGEON: Samson Frederic, MD   ASSISTANT: Clint Bolder, PA-C  PREOPERATIVE DIAGNOSIS: Primary Right knee arthritis.   POSTOPERATIVE DIAGNOSIS: Primary Right knee arthritis.   PROCEDURE: Computer assisted Right total knee arthroplasty.   IMPLANTS: Zimmer Persona PPS Cementless CR femur, size 11. Persona 0 degree Spiked Keel OsseoTi Tibia, size G. Vivacit-E polyethelyene insert, size 10 mm, CR. TM standard patella, size 38 mm.  ANESTHESIA:  MAC, Regional, and Spinal  TOURNIQUET TIME: Not utilized.   ESTIMATED BLOOD LOSS:-50 mL    ANTIBIOTICS: 2 g Ancef.  DRAINS: None.  COMPLICATIONS: None   CONDITION: PACU - hemodynamically stable.   BRIEF CLINICAL NOTE: Bill Johnson is a 67 y.o. male with a long-standing history of Right knee arthritis. After failing conservative management, the patient was indicated for total knee arthroplasty. The risks, benefits, and alternatives to the procedure were explained, and the patient elected to proceed.  PROCEDURE IN DETAIL: Adductor canal block was obtained in the pre-op holding area. Once inside the operative room, spinal anesthesia was obtained, and a foley catheter was inserted. The patient was then positioned and the lower extremity was prepped and draped in the normal sterile surgical fashion.  A time-out was called verifying side and site of surgery. The patient received IV antibiotics within 60 minutes of beginning the procedure. A tourniquet was not utilized.   An anterior approach to the knee was performed utilizing a midvastus arthrotomy. A medial release was performed and the patellar fat pad was excised. Stryker imageless navigation was used to cut the distal femur perpendicular to the mechanical axis. A freehand patellar resection was performed, and the patella was sized an prepared with 3 lug holes.  Nagivation was used to make a neutral proximal tibia resection, taking 8 mm of bone from the less affected lateral  side with 3 degrees of slope. The menisci were excised. A spacer block was placed, and the alignment and balance in extension were confirmed.   The distal femur was sized using the 3-degree external rotation guide referencing the posterior femoral cortex. The appropriate 4-in-1 cutting block was pinned into place. Rotation was checked using Whiteside's line, the epicondylar axis, and then confirmed with a spacer block in flexion. The remaining femoral cuts were performed, taking care to protect the MCL.  The tibia was sized and the trial tray was pinned into place. The remaining trail components were inserted. The knee was stable to varus and valgus stress through a full range of motion. The patella tracked centrally, and the PCL was well balanced. The trial components were removed, and the proximal tibial surface was prepared. Final components were impacted into place. The knee was tested for a final time and found to be well balanced.   The wound was copiously irrigated with Prontosan solution and normal saline using pule lavage.  Marcaine solution was injected into the periarticular soft tissue.  The wound was closed in layers using #1 Vicryl and Stratafix for the fascia, 2-0 Vicryl for the subcutaneous fat, 2-0 Monocryl for the deep dermal layer, 3-0 running Monocryl subcuticular Stitch, and 4-0 Monocryl stay sutures at both ends of the wound. Dermabond was applied to the skin.  Once the glue was fully dried, an Aquacell Ag and compressive dressing were applied.  The patient was transported to the recovery room in stable condition.  Sponge, needle, and instrument counts were correct at the end of the case x2.  The patient tolerated the procedure well and there were no known  complications.  The aquamantis was utilized for this case to help facilitate better hemostasis as patient was felt to be at increased risk of bleeding because of recent anticoagulation use, no tourniquet use.  A oscillating saw tip  was utilized for this case to prevent damage to the soft tissue structures such as muscles, ligaments and tendons, and to ensure accurate bone cuts. This patient was at increased risk for above structures due to  minimally invasive approach.  Please note that a surgical assistant was a medical necessity for this procedure in order to perform it in a safe and expeditious manner. Surgical assistant was necessary to retract the ligaments and vital neurovascular structures to prevent injury to them and also necessary for proper positioning of the limb to allow for anatomic placement of the prosthesis.

## 2022-06-10 NOTE — H&P (Signed)
TOTAL KNEE ADMISSION H&P  Patient is being admitted for right total knee arthroplasty.  Subjective:  Chief Complaint:right knee pain.  HPI: Bill Johnson, 67 y.o. male, has a history of pain and functional disability in the right knee due to arthritis and has failed non-surgical conservative treatments for greater than 12 weeks to includeNSAID's and/or analgesics, corticosteriod injections, viscosupplementation injections, flexibility and strengthening excercises, use of assistive devices, and activity modification.  Onset of symptoms was gradual, starting 10 years ago with rapidlly worsening course since that time. The patient noted no past surgery on the right knee(s).  Patient currently rates pain in the right knee(s) at 10 out of 10 with activity. Patient has night pain, worsening of pain with activity and weight bearing, pain that interferes with activities of daily living, pain with passive range of motion, crepitus, and joint swelling.  Patient has evidence of subchondral cysts, subchondral sclerosis, periarticular osteophytes, and joint space narrowing by imaging studies. There is no active infection.  Patient Active Problem List   Diagnosis Date Noted   Coronary artery disease of native artery of native heart with stable angina pectoris (HCC)    Encounter for general adult medical examination with abnormal findings 12/23/2020   Prostate cancer screening 12/23/2020   Aortic atherosclerosis (HCC) 10/23/2020   Pulmonary nodule 09/20/2020   Unstable angina (HCC) 04/23/2020   Tubulovillous adenoma of colon 01/11/2020   Status post coronary artery stent placement 01/04/2020   Tubulovillous adenoma of rectum 10/26/2019   CAD, multiple vessel 07/06/2019   Alcohol abuse 03/27/2019   Angina pectoris (HCC) 03/19/2019   Other spondylosis, cervical region 09/06/2018   Hyperlipemia 07/26/2018   GERD (gastroesophageal reflux disease) 06/24/2018   OA (osteoarthritis) of shoulder 06/28/2017    B12 deficiency 07/01/2016   Essential hypertension 07/01/2016   COPD GOLD 0  07/01/2016   GAD (generalized anxiety disorder) 07/01/2016   Vertigo 11/28/2014   Hearing loss 11/28/2014   Tremor 11/28/2014   Past Medical History:  Diagnosis Date   Alcohol abuse    Allergy    seasonal   Anginal pain (HCC)    Anxiety    CAD (coronary artery disease)    a. 06/2019 cath - s/p successful PCI of the ostial to mid LAD with IVUS guidance and DES x 2.   COPD (chronic obstructive pulmonary disease) (HCC)    Essential hypertension    GERD (gastroesophageal reflux disease)    HOH (hard of hearing)    Hyperlipidemia    Hypertension    OA (osteoarthritis) of knee    Pulmonary nodules    a. seen on coronary CT 05/2019, will need OP f/u.   Rotator cuff tear arthropathy of both shoulders    On DISABILITY, non operative case   Shoulder pain    Tobacco abuse    Vertigo     Past Surgical History:  Procedure Laterality Date   CARDIAC CATHETERIZATION  06/2005   no significant CAD   CATARACT EXTRACTION Left    COLONOSCOPY WITH PROPOFOL N/A 05/01/2019   Procedure: COLONOSCOPY WITH PROPOFOL;  Surgeon: Rourk, Robert M, MD;  [facility];  Service: Endoscopy;  Laterality: N/A;  7:30am   COLONOSCOPY WITH PROPOFOL N/A 03/07/2020   Procedure: COLONOSCOPY WITH PROPOFOL;  Surgeon: Rourk, Robert M, MD;  [facility]: [facility];  Service: Endoscopy;  Laterality: N/A;  8:30am   CORONARY PRESSURE/FFR STUDY N/A 07/05/2019   Procedure: INTRAVASCULAR PRESSURE WIRE/FFR STUDY;  Surgeon: Jordan, Peter M, MD;  [facility];  Service: Cardiovascular;  Laterality: N/A;   CORONARY STENT INTERVENTION N/A 07/05/2019   Procedure: CORONARY STENT INTERVENTION;  Surgeon: Jordan, Peter M, MD;  [facility];  Service: Cardiovascular;  Laterality: N/A;   CORONARY STENT INTERVENTION N/A 07/20/2019   Procedure: CORONARY STENT INTERVENTION;  Surgeon: Varanasi, Jayadeep S, MD;  [facility];  Service: Cardiovascular;  Laterality: N/A;   CORONARY ULTRASOUND/IVUS N/A 07/05/2019   Procedure: Intravascular Ultrasound/IVUS;  Surgeon: Jordan, Peter M, MD;  [facility];  Service: Cardiovascular;  Laterality: N/A;   ESOPHAGOGASTRODUODENOSCOPY  08/2005   Dr. Perry: small hiatal hernia   HERNIA REPAIR  1983   LEFT HEART CATH AND CORONARY ANGIOGRAPHY N/A 07/05/2019   Procedure: LEFT HEART CATH AND CORONARY ANGIOGRAPHY;  Surgeon: Jordan, Peter M, MD;  [facility];  Service: Cardiovascular;  Laterality: N/A;   LEFT HEART CATH AND CORONARY ANGIOGRAPHY N/A 07/20/2019   Procedure: LEFT HEART CATH AND CORONARY ANGIOGRAPHY;  Surgeon: Varanasi, Jayadeep S, MD;  [facility];  Service: Cardiovascular;  Laterality: N/A;   LEFT HEART CATH AND CORONARY ANGIOGRAPHY N/A 04/24/2020   Procedure: LEFT HEART CATH AND CORONARY ANGIOGRAPHY;  Surgeon: Smith, Henry W, MD;  [facility];  Service: Cardiovascular;  Laterality: N/A;   LEFT HEART CATH AND CORONARY ANGIOGRAPHY N/A 01/02/2021   Procedure: LEFT HEART CATH AND CORONARY ANGIOGRAPHY;  Surgeon: McAlhany, Christopher D, MD;  [facility];  Service: Cardiovascular;  Laterality: N/A;   POLYPECTOMY  05/01/2019   Procedure: POLYPECTOMY;  Surgeon: Rourk, Robert M, MD;  [facility];  Service: Endoscopy;;   POLYPECTOMY  03/07/2020   Procedure: POLYPECTOMY;  Surgeon: Rourk, Robert M, MD;  [facility];  Service: Endoscopy;;   ROTATOR CUFF REPAIR Bilateral    TONSILLECTOMY  1977    Current Outpatient Medications  Medication Sig Dispense Refill Last Dose   acetaminophen (TYLENOL) 500 MG tablet Take 1,000 mg by mouth as needed for mild pain or headache.      albuterol (VENTOLIN HFA) 108 (90 Base) MCG/ACT inhaler Inhale 2 puffs into the lungs every 6 (six) hours as needed for wheezing or shortness of breath. (Patient not taking: Reported on  06/03/2022) 1 each 2    aspirin 81 MG tablet Take 81 mg by mouth daily.      atorvastatin (LIPITOR) 80 MG tablet Take 1 tablet (80 mg total) by mouth every evening. 90 tablet 2    cetirizine (ZYRTEC) 10 MG tablet Take 10 mg by mouth daily.      Cholecalciferol (VITAMIN D3) 50 MCG (2000 UT) CAPS Take 2,000 Units by mouth daily.      clopidogrel (PLAVIX) 75 MG tablet Take 1 tablet (75 mg total) by mouth daily. 90 tablet 3    [employer] VITAMIN B-12 2000 MCG TBCR Take 2,000 mcg by mouth daily.      diazepam (VALIUM) 5 MG tablet Take 1 tablet (5 mg total) by mouth every 12 (twelve) hours as needed for anxiety. 45 tablet 2    Evolocumab (REPATHA SURECLICK) 140 MG/ML SOAJ Inject 140 mg into the skin every 14 (fourteen) days. 6 mL 3    ezetimibe (ZETIA) 10 MG tablet Take 1 tablet (10 mg total) by mouth daily. 90 tablet 3    HYDROcodone-acetaminophen (NORCO/VICODIN) 5-325 MG tablet Take 0.5-1 tablets by mouth as needed for moderate pain or severe pain.        isosorbide mononitrate (IMDUR) 60 MG 24 hr tablet Take 1.5 tablets (90 mg total) by mouth daily. 135 tablet 3    meclizine (ANTIVERT) 25 MG tablet TAKE 1 TABLET BY MOUTH THREE TIMES DAILY AS NEEDED FOR DIZZINESS (Patient taking differently: Take 25 mg by mouth 3 (three) times daily as needed for dizziness or nausea.) 90 tablet 2    nitroGLYCERIN (NITROSTAT) 0.4 MG SL tablet Place 1 tablet (0.4 mg total) under the tongue every 5 (five) minutes as needed for chest pain (up to 3 doses). 25 tablet 3    pantoprazole (PROTONIX) 40 MG tablet Take 1 tablet (40 mg total) by mouth 2 (two) times daily. (Patient taking differently: Take 40 mg by mouth daily.) 180 tablet 2    ranolazine (RANEXA) 1000 MG SR tablet Take 1 tablet (1,000 mg total) by mouth 2 (two) times daily. 180 tablet 2    No current facility-administered medications for this visit.   No Known Allergies  Social History   Tobacco Use   Smoking status: Former    Packs/day: 1.00    Years: 43.00     Additional pack years: 0.00    Total pack years: 43.00    Types: Cigarettes    Start date: 04/15/1970    Quit date: 07/26/2020    Years since quitting: 1.8   Smokeless tobacco: Never  Substance Use Topics   Alcohol use: Not Currently    Comment: Drinks 3-4 beers/day     Family History  Problem Relation Age of Onset   Heart disease Mother    Diabetes Mother    Hypertension Mother    Heart disease Father    Hypertension Father    Stroke Other    Cancer Other    Cancer Brother        prostate   Colon cancer Neg Hx      Review of Systems  Musculoskeletal:  Positive for arthralgias, gait problem and joint swelling.  All other systems reviewed and are negative.   Objective:  Physical Exam Constitutional:      Appearance: Normal appearance.  HENT:     Head: Normocephalic and atraumatic.     Nose: Nose normal.     Mouth/Throat:     Mouth: Mucous membranes are moist.     Pharynx: Oropharynx is clear.  Eyes:     Conjunctiva/sclera: Conjunctivae normal.  Cardiovascular:     Rate and Rhythm: Normal rate and regular rhythm.     Pulses: Normal pulses.     Heart sounds: Normal heart sounds.  Pulmonary:     Effort: Pulmonary effort is normal.     Breath sounds: Normal breath sounds.  Abdominal:     General: Abdomen is flat.     Palpations: Abdomen is soft.  Genitourinary:    Comments: deferred Musculoskeletal:     Cervical back: Normal range of motion and neck supple.     Comments: Examination the right knee reveals no skin wounds or lesions. He has swelling, trace effusion. No warmth or erythema. Varus deformity. Tenderness to palpation medial joint line, lateral joint line, peripatellar retinacular tissues with a positive grind sign. Range of motion 15 to 120 degrees without any ligamentous instability. Painless range of motion the hip  Distally, there is no focal motor or sensory deficit. Has palpable pedal pulse.  Ambulates with an antalgic gait.  Skin:    General:  Skin is warm and dry.     Capillary Refill: Capillary refill takes less than 2 seconds.    Neurological:     General: No focal deficit present.     Mental Status: He is alert and oriented to person, place, and time.  Psychiatric:        Mood and Affect: Mood normal.        Behavior: Behavior normal.        Thought Content: Thought content normal.        Judgment: Judgment normal.     Vital signs in last 24 hours: @VSRANGES@  Labs:   Estimated body mass index is 28.41 kg/m as calculated from the following:   Height as of 06/03/22: 5' 6" (1.676 m).   Weight as of 06/03/22: 79.8 kg.   Imaging Review Plain radiographs demonstrate severe degenerative joint disease of the right knee(s). The overall alignment issignificant varus. The bone quality appears to be adequate for age and reported activity level.      Assessment/Plan:  End stage arthritis, right knee   The patient history, physical examination, clinical judgment of the provider and imaging studies are consistent with end stage degenerative joint disease of the right knee(s) and total knee arthroplasty is deemed medically necessary. The treatment options including medical management, injection therapy arthroscopy and arthroplasty were discussed at length. The risks and benefits of total knee arthroplasty were presented and reviewed. The risks due to aseptic loosening, infection, stiffness, patella tracking problems, thromboembolic complications and other imponderables were discussed. The patient acknowledged the explanation, agreed to proceed with the plan and consent was signed. Patient is being admitted for inpatient treatment for surgery, pain control, PT, OT, prophylactic antibiotics, VTE prophylaxis, progressive ambulation and ADL's and discharge planning. The patient is planning to be discharged home with OPPT after an overnight stay.   Therapy Plans: outpatient therapy. [facility]st PT appt to be scheduled  06/15/22. Disposition: Home with Daughter Planned DVT Prophylaxis: aspirin 81mg BID and plavix DME needed: Has rolling walker.  PCP: Cleared.  Cardiology:  TXA: IV  Allergies: NDKA.  Anesthesia Concerns: None.  BMI: 29 Last HgbA1c: 5.9.  Other: - CAD, s/p PCI 2021. Aspirin and plavix.  - Vertigo history on meclizine  - Oxycodone, zofran.  - 06/03/22: Hgb 13.6, K+ 4.6, Cr. 0.79.     Patient's anticipated LOS is less than 2 midnights, meeting these requirements: - Younger than 65 - Lives within 1 hour of care - Has a competent adult at home to recover with post-op recover - NO history of  - Chronic pain requiring opiods  - Diabetes  - Coronary Artery Disease  - Heart failure  - Heart attack  - Stroke  - DVT/VTE  - Cardiac arrhythmia  - Respiratory Failure/COPD  - Renal failure  - Anemia  - Advanced Liver disease   

## 2022-06-11 DIAGNOSIS — J449 Chronic obstructive pulmonary disease, unspecified: Secondary | ICD-10-CM | POA: Diagnosis not present

## 2022-06-11 DIAGNOSIS — Z87891 Personal history of nicotine dependence: Secondary | ICD-10-CM | POA: Diagnosis not present

## 2022-06-11 DIAGNOSIS — I25119 Atherosclerotic heart disease of native coronary artery with unspecified angina pectoris: Secondary | ICD-10-CM | POA: Diagnosis not present

## 2022-06-11 DIAGNOSIS — Z7902 Long term (current) use of antithrombotics/antiplatelets: Secondary | ICD-10-CM | POA: Diagnosis not present

## 2022-06-11 DIAGNOSIS — I10 Essential (primary) hypertension: Secondary | ICD-10-CM | POA: Diagnosis not present

## 2022-06-11 DIAGNOSIS — R42 Dizziness and giddiness: Secondary | ICD-10-CM | POA: Diagnosis not present

## 2022-06-11 DIAGNOSIS — M1711 Unilateral primary osteoarthritis, right knee: Secondary | ICD-10-CM | POA: Diagnosis not present

## 2022-06-11 DIAGNOSIS — Z7982 Long term (current) use of aspirin: Secondary | ICD-10-CM | POA: Diagnosis not present

## 2022-06-11 DIAGNOSIS — Z955 Presence of coronary angioplasty implant and graft: Secondary | ICD-10-CM | POA: Diagnosis not present

## 2022-06-11 LAB — BASIC METABOLIC PANEL
Anion gap: 5 (ref 5–15)
BUN: 6 mg/dL — ABNORMAL LOW (ref 8–23)
CO2: 23 mmol/L (ref 22–32)
Calcium: 7.6 mg/dL — ABNORMAL LOW (ref 8.9–10.3)
Chloride: 103 mmol/L (ref 98–111)
Creatinine, Ser: 0.77 mg/dL (ref 0.61–1.24)
GFR, Estimated: >60 mL/min (ref >60)
Glucose, Bld: 141 mg/dL — ABNORMAL HIGH (ref 70–99)
Potassium: 3.8 mmol/L (ref 3.5–5.1)
Sodium: 131 mmol/L — ABNORMAL LOW (ref 135–145)

## 2022-06-11 LAB — CBC
HCT: 34.7 % — ABNORMAL LOW (ref 39.0–52.0)
Hemoglobin: 11 g/dL — ABNORMAL LOW (ref 13.0–17.0)
MCH: 32.4 pg (ref 26.0–34.0)
MCHC: 31.7 g/dL (ref 30.0–36.0)
MCV: 102.1 fL — ABNORMAL HIGH (ref 80.0–100.0)
Platelets: 210 10*3/uL (ref 150–400)
RBC: 3.4 MIL/uL — ABNORMAL LOW (ref 4.22–5.81)
RDW: 13.2 % (ref 11.5–15.5)
WBC: 10.5 10*3/uL (ref 4.0–10.5)
nRBC: 0 % (ref 0.0–0.2)

## 2022-06-11 MED ORDER — SENNA 8.6 MG PO TABS: 2.0000 | ORAL_TABLET | Freq: Every day | ORAL | 0 refills | Status: AC

## 2022-06-11 MED ORDER — ONDANSETRON HCL 4 MG PO TABS: 4.0000 mg | ORAL_TABLET | Freq: Three times a day (TID) | ORAL | 0 refills | Status: AC | PRN

## 2022-06-11 MED ORDER — POLYETHYLENE GLYCOL 3350 17 G PO PACK: 17.0000 g | PACK | Freq: Every day | ORAL | 0 refills | Status: AC | PRN

## 2022-06-11 MED ORDER — DOCUSATE SODIUM 100 MG PO CAPS: 100.0000 mg | ORAL_CAPSULE | Freq: Two times a day (BID) | ORAL | 0 refills | Status: AC

## 2022-06-11 MED ORDER — ACETAMINOPHEN 500 MG PO TABS: 1000.0000 mg | ORAL_TABLET | Freq: Three times a day (TID) | ORAL | 0 refills | Status: AC | PRN

## 2022-06-11 MED ORDER — OXYCODONE HCL 5 MG PO TABS: 5.0000 mg | ORAL_TABLET | ORAL | 0 refills | Status: AC | PRN

## 2022-06-11 MED ORDER — ASPIRIN 81 MG PO CHEW: 81.0000 mg | CHEWABLE_TABLET | Freq: Two times a day (BID) | ORAL | 0 refills | Status: AC

## 2022-06-11 NOTE — Progress Notes (Signed)
Physical Therapy Treatment Patient Details Name: Bill Johnson MRN: [id number] DOB: 03-15-1955 Today's Date: 06/11/2022   History of Present Illness 67 yo male presents to therapy s/p R TKA on 06/10/2022 due to failure of conservative measures. Pt PMH includes but is not limited to: CAD s/p stent placement, cervical spondylosis, HDL, GERD, HTN, COPD, vertigo, HOH, B RTC repair and tremors.    PT Comments    Pt is progressing well with mobility and is ready to DC home from a PT standpoint. Reviewed HEP with [name], who will be primary CG. Also completed stair training.    Recommendations for follow up therapy are one component of a multi-disciplinary discharge planning process, led by the attending physician.  Recommendations may be updated based on patient status, additional functional criteria and insurance authorization.  Follow Up Recommendations       Assistance Recommended at Discharge Intermittent Supervision/Assistance  Patient can return home with the following A little help with walking and/or transfers;A little help with bathing/dressing/bathroom;Assistance with cooking/housework;Assist for transportation;Help with stairs or ramp for entrance   Equipment Recommendations  None recommended by PT    Recommendations for Other Services       Precautions / Restrictions Precautions Precautions: Knee;Fall Precaution Booklet Issued: Yes (comment) Precaution Comments: reviewed no pillow under knee Restrictions Weight Bearing Restrictions: No     Mobility  Bed Mobility Overal bed mobility: Modified Independent Bed Mobility: Supine to Sit     Supine to sit: Modified independent (Device/Increase time), HOB elevated     General bed mobility comments: up in recliner    Transfers Overall transfer level: Needs assistance Equipment used: Rolling walker (2 wheels) Transfers: Sit to/from Stand Sit to Stand: Supervision           General transfer  comment: cues for proper UE placement    Ambulation/Gait Ambulation/Gait assistance: Supervision Gait Distance (Feet): 40 Feet Assistive device: Rolling walker (2 wheels) Gait Pattern/deviations: Step-to pattern, Decreased step length - left, Decreased step length - right Gait velocity: decreased     General Gait Details: steady, no loss of balance, no buckling   Stairs Stairs: Yes Stairs assistance: Min guard Stair Management: No rails, Forwards, Step to pattern, With walker Number of Stairs: 1 General stair comments: 1 step x 2 trials, VCs sequencing, daughter present   Wheelchair Mobility    Modified Rankin (Stroke Patients Only)       Balance Overall balance assessment: Needs assistance Sitting-balance support: Feet supported Sitting balance-Leahy Scale: Good     Standing balance support: Bilateral upper extremity supported, During functional activity, Reliant on assistive device for balance Standing balance-Leahy Scale: Fair                              Cognition Arousal/Alertness: Awake/alert Behavior During Therapy: WFL for tasks assessed/performed Overall Cognitive Status: Within Functional Limits for tasks assessed                                          Exercises Total Joint Exercises Ankle Circles/Pumps: AROM, Both, 20 reps Quad Sets: AROM, Both, 5 reps, Supine Short Arc Quad: AROM, Right, 10 reps, Supine Heel Slides: AAROM, Right, 10 reps, Supine Hip ABduction/ADduction: AROM, Right, 10 reps, Supine Straight Leg Raises: AROM, Right, 5 reps, Supine Long Arc Quad: AROM, Right, 10 reps, Seated Knee Flexion:  AAROM, Right, 10 reps, Seated Goniometric ROM: ~5-80* AAROM    General Comments        Pertinent Vitals/Pain Pain Assessment Pain Score: 5  Pain Location: R knee Pain Descriptors / Indicators: Sore Pain Intervention(s): Limited activity within patient's tolerance, Monitored during session, Premedicated before  session, Ice applied    Home Living                          Prior Function            PT Goals (current goals can now be found in the care plan section) Acute Rehab PT Goals Patient Stated Goal: to get stronger and go home PT Goal Formulation: With patient Time For Goal Achievement: 06/24/22 Potential to Achieve Goals: Good Progress towards PT goals: Progressing toward goals    Frequency    7X/week      PT Plan Current plan remains appropriate    Co-evaluation              AM-PAC PT "6 Clicks" Mobility   Outcome Measure  Help needed turning from your back to your side while in a flat bed without using bedrails?: A Little Help needed moving from lying on your back to sitting on the side of a flat bed without using bedrails?: A Little Help needed moving to and from a bed to a chair (including a wheelchair)?: A Little Help needed standing up from a chair using your arms (e.g., wheelchair or bedside chair)?: A Little Help needed to walk in hospital room?: A Little Help needed climbing 3-5 steps with a railing? : A Little 6 Click Score: 18    End of Session Equipment Utilized During Treatment: Gait belt Activity Tolerance: Patient tolerated treatment well Patient left: in chair;with call bell/phone within reach;with chair alarm set;with family/visitor present Nurse Communication: Mobility status PT Visit Diagnosis: Unsteadiness on feet (R26.81);Other abnormalities of gait and mobility (R26.89);Muscle weakness (generalized) (M62.81);Pain Pain - Right/Left: Right Pain - part of body: Knee     Time: 1610-9604 PT Time Calculation (min) (ACUTE ONLY): 35 min  Charges:  $Gait Training: 8-22 mins $Therapeutic Exercise: 8-22 mins                     Ralene Bathe Kistler PT 06/11/2022  Acute Rehabilitation Services  Office [phone]

## 2022-06-11 NOTE — Progress Notes (Signed)
    Subjective:  Patient reports pain as mild to moderate.  Denies N/V/CP/SOB/Abd pain. He states that his pain is controlled with the medication. He denies any tingling or numbness in LE bilaterally. He states he ambulated well with PT yesterday.   Objective:   VITALS:   Vitals:   06/10/22 2142 06/11/22 0122 06/11/22 0524 06/11/22 0958  BP: 139/67 133/71 (!) 142/83 133/70  Pulse: 79 76 76 87  Resp: 17 15 17 20   Temp: 97.6 F (36.4 C) 97.7 F (36.5 C) 97.8 F (36.6 C) 97.7 F (36.5 C)  TempSrc: Oral Oral Oral Oral  SpO2: 94% 93% 95% 96%  Weight:      Height:        Patient sitting up in bed. NAD.  Neurologically intact ABD soft Neurovascular intact Sensation intact distally Intact pulses distally Dorsiflexion/Plantar flexion intact Incision: dressing C/D/I No cellulitis present Compartment soft   Lab Results  Component Value Date   WBC 10.5 06/11/2022   HGB 11.0 (L) 06/11/2022   HCT 34.7 (L) 06/11/2022   MCV 102.1 (H) 06/11/2022   PLT 210 06/11/2022   BMET    Component Value Date/Time   NA 131 (L) 06/11/2022 0349   NA 142 12/23/2020 1415   K 3.8 06/11/2022 0349   CL 103 06/11/2022 0349   CO2 23 06/11/2022 0349   GLUCOSE 141 (H) 06/11/2022 0349   BUN 6 (L) 06/11/2022 0349   BUN 6 (L) 12/23/2020 1415   CREATININE 0.77 06/11/2022 0349   CREATININE 0.71 12/01/2019 1435   CALCIUM 7.6 (L) 06/11/2022 0349   EGFR 97 12/23/2020 1415   GFRNONAA >60 06/11/2022 0349     Assessment/Plan: 1 Day Post-Op   Principal Problem:   Osteoarthritis of right knee   WBAT with walker.  DVT ppx: Aspirin and and plavix , SCDs, TEDS PO pain control PT/OT: Patient ambulated 50 feet with PT yesterday. Continue PT today.  Dispo: Discharge home once cleared with PT. Rolling walker order for patient.     Clois Dupes, PA-C 06/11/2022, 9:59 AM   [facility]  [place] [address], [address], [place], [place] [address] Phone:  [phone] [email or web address] Facebook  [employer]

## 2022-06-11 NOTE — TOC Transition Note (Signed)
Transition of Care [facility]) - CM/SW Discharge Note  Patient Details  Name: Bill Johnson MRN: [id number] Date of Birth: 08/21/1955  [facility]) CM/SW Contact:  Ewing Schlein, LCSW Phone Number: 06/11/2022, 9:51 AM  Clinical Narrative: Patient is expected to discharge home after working with PT. CSW met with patient to confirm discharge plan and needs. Patient will go home with OPPT at [facility]. Patient reported he has a rollator at home, but will need a rolling walker and has not received one through insurance in the past 5 years. [employer] delivered rolling walker to patient's room. TOC signing off.    Final next level of care: OP Rehab Barriers to Discharge: No Barriers Identified  Patient Goals and CMS Choice CMS Medicare.gov Compare Post Acute Care list provided to:: Patient Choice offered to / list presented to : Patient  Discharge Plan and Services Additional resources added to the After Visit Summary for        DME Arranged: Walker rolling DME Agency: [employer] Date DME Agency Contacted: 06/11/22 Representative spoke with at DME Agency: Yvonna Alanis  Social Determinants of Health (SDOH) Interventions SDOH Screenings   Food Insecurity: No Food Insecurity (06/10/2022)  Housing: Low Risk  (06/10/2022)  Transportation Needs: No Transportation Needs (06/10/2022)  Utilities: Not At Risk (06/10/2022)  Alcohol Screen: Low Risk  (12/01/2019)  Depression (PHQ2-9): Low Risk  (04/22/2021)  Tobacco Use: Medium Risk (06/10/2022)   Readmission Risk Interventions     No data to display

## 2022-06-11 NOTE — Progress Notes (Addendum)
Physical Therapy Treatment Patient Details Name: Bill Johnson MRN: [id number] DOB: 1955/11/18 Today's Date: 06/11/2022   History of Present Illness 67 yo male presents to therapy s/p R TKA on 06/10/2022 due to failure of conservative measures. Pt PMH includes but is not limited to: CAD s/p stent placement, cervical spondylosis, HDL, GERD, HTN, COPD, vertigo, HOH, B RTC repair and tremors.    PT Comments    Pt is progressing well with mobility, he ambulated 130' with RW, no loss of balance. Instructed pt in TKA HEP, he demonstrates good understanding. Will plan to review HEP and stair training with pt's daughter once she arrives later this morning, then I expect pt will be ready to DC home from a PT standpoint.   Recommendations for follow up therapy are one component of a multi-disciplinary discharge planning process, led by the attending physician.  Recommendations may be updated based on patient status, additional functional criteria and insurance authorization.  Follow Up Recommendations       Assistance Recommended at Discharge Intermittent Supervision/Assistance  Patient can return home with the following A little help with walking and/or transfers;A little help with bathing/dressing/bathroom;Assistance with cooking/housework;Assist for transportation;Help with stairs or ramp for entrance   Equipment Recommendations  None recommended by PT    Recommendations for Other Services       Precautions / Restrictions Precautions Precautions: Knee;Fall Precaution Booklet Issued: Yes (comment) Precaution Comments: reviewed no pillow under knee Restrictions Weight Bearing Restrictions: No     Mobility  Bed Mobility Overal bed mobility: Modified Independent Bed Mobility: Supine to Sit     Supine to sit: Modified independent (Device/Increase time), HOB elevated          Transfers Overall transfer level: Needs assistance Equipment used: Rolling walker (2 wheels) Transfers:  Sit to/from Stand Sit to Stand: Supervision           General transfer comment: cues for proper UE placement    Ambulation/Gait Ambulation/Gait assistance: Supervision Gait Distance (Feet): 130 Feet Assistive device: Rolling walker (2 wheels) Gait Pattern/deviations: Step-to pattern, Decreased step length - left, Decreased step length - right Gait velocity: decreased     General Gait Details: steady, no loss of balance, no buckling   Stairs             Wheelchair Mobility    Modified Rankin (Stroke Patients Only)       Balance Overall balance assessment: Needs assistance Sitting-balance support: Feet supported Sitting balance-Leahy Scale: Good     Standing balance support: Bilateral upper extremity supported, During functional activity, Reliant on assistive device for balance Standing balance-Leahy Scale: Fair                              Cognition Arousal/Alertness: Awake/alert Behavior During Therapy: WFL for tasks assessed/performed Overall Cognitive Status: Within Functional Limits for tasks assessed                                          Exercises Total Joint Exercises Ankle Circles/Pumps: AROM, Both, 20 reps Quad Sets: AROM, Both, 5 reps, Supine Short Arc Quad: AROM, Right, 10 reps, Supine Heel Slides: AAROM, Right, 10 reps, Supine Hip ABduction/ADduction: AROM, Right, 10 reps, Supine Straight Leg Raises: AROM, Right, 5 reps, Supine Long Arc Quad: AROM, Right, 10 reps, Seated Knee Flexion: AAROM, Right, 10 reps,  Seated Goniometric ROM: ~5-80* AAROM    General Comments        Pertinent Vitals/Pain Pain Assessment Pain Score: 4  Pain Location: R knee Pain Descriptors / Indicators: Sore Pain Intervention(s): Limited activity within patient's tolerance, Monitored during session, Premedicated before session, Ice applied    Home Living                          Prior Function            PT  Goals (current goals can now be found in the care plan section) Acute Rehab PT Goals Patient Stated Goal: to get stronger and go home PT Goal Formulation: With patient Time For Goal Achievement: 06/24/22 Potential to Achieve Goals: Good Progress towards PT goals: Progressing toward goals    Frequency    7X/week      PT Plan Current plan remains appropriate    Co-evaluation              AM-PAC PT "6 Clicks" Mobility   Outcome Measure  Help needed turning from your back to your side while in a flat bed without using bedrails?: A Little Help needed moving from lying on your back to sitting on the side of a flat bed without using bedrails?: A Little Help needed moving to and from a bed to a chair (including a wheelchair)?: A Little Help needed standing up from a chair using your arms (e.g., wheelchair or bedside chair)?: A Little Help needed to walk in hospital room?: A Little Help needed climbing 3-5 steps with a railing? : A Little 6 Click Score: 18    End of Session Equipment Utilized During Treatment: Gait belt Activity Tolerance: Patient tolerated treatment well Patient left: in chair;with call bell/phone within reach;with chair alarm set Nurse Communication: Mobility status PT Visit Diagnosis: Unsteadiness on feet (R26.81);Other abnormalities of gait and mobility (R26.89);Muscle weakness (generalized) (M62.81);Pain Pain - Right/Left: Right Pain - part of body: Knee     Time: 1610-9604 PT Time Calculation (min) (ACUTE ONLY): 27 min  Charges:  $Gait Training: 8-22 mins $Therapeutic Exercise: 8-22 mins                     Ralene Bathe Kistler PT 06/11/2022  Acute Rehabilitation Services  Office [phone]

## 2022-06-12 ENCOUNTER — Encounter (HOSPITAL_COMMUNITY): Payer: Self-pay | Admitting: Orthopedic Surgery

## 2022-06-16 DIAGNOSIS — M25561 Pain in right knee: Secondary | ICD-10-CM | POA: Diagnosis not present

## 2022-06-16 DIAGNOSIS — M25661 Stiffness of right knee, not elsewhere classified: Secondary | ICD-10-CM | POA: Diagnosis not present

## 2022-06-18 DIAGNOSIS — M25561 Pain in right knee: Secondary | ICD-10-CM | POA: Diagnosis not present

## 2022-06-18 DIAGNOSIS — M25661 Stiffness of right knee, not elsewhere classified: Secondary | ICD-10-CM | POA: Diagnosis not present

## 2022-06-23 DIAGNOSIS — Z471 Aftercare following joint replacement surgery: Secondary | ICD-10-CM | POA: Diagnosis not present

## 2022-06-23 DIAGNOSIS — Z96651 Presence of right artificial knee joint: Secondary | ICD-10-CM | POA: Diagnosis not present

## 2022-06-23 DIAGNOSIS — M25661 Stiffness of right knee, not elsewhere classified: Secondary | ICD-10-CM | POA: Diagnosis not present

## 2022-06-23 DIAGNOSIS — M25561 Pain in right knee: Secondary | ICD-10-CM | POA: Diagnosis not present

## 2022-06-25 DIAGNOSIS — M25661 Stiffness of right knee, not elsewhere classified: Secondary | ICD-10-CM | POA: Diagnosis not present

## 2022-06-25 DIAGNOSIS — M25561 Pain in right knee: Secondary | ICD-10-CM | POA: Diagnosis not present

## 2022-06-29 DIAGNOSIS — M25561 Pain in right knee: Secondary | ICD-10-CM | POA: Diagnosis not present

## 2022-06-29 DIAGNOSIS — M25661 Stiffness of right knee, not elsewhere classified: Secondary | ICD-10-CM | POA: Diagnosis not present

## 2022-07-01 DIAGNOSIS — M25661 Stiffness of right knee, not elsewhere classified: Secondary | ICD-10-CM | POA: Diagnosis not present

## 2022-07-01 DIAGNOSIS — M25561 Pain in right knee: Secondary | ICD-10-CM | POA: Diagnosis not present

## 2022-07-03 DIAGNOSIS — M25661 Stiffness of right knee, not elsewhere classified: Secondary | ICD-10-CM | POA: Diagnosis not present

## 2022-07-03 DIAGNOSIS — M25561 Pain in right knee: Secondary | ICD-10-CM | POA: Diagnosis not present

## 2022-07-06 DIAGNOSIS — M25661 Stiffness of right knee, not elsewhere classified: Secondary | ICD-10-CM | POA: Diagnosis not present

## 2022-07-06 DIAGNOSIS — M25561 Pain in right knee: Secondary | ICD-10-CM | POA: Diagnosis not present

## 2022-07-07 ENCOUNTER — Ambulatory Visit: Payer: Medicare Other | Admitting: Internal Medicine

## 2022-07-07 DIAGNOSIS — E782 Mixed hyperlipidemia: Secondary | ICD-10-CM | POA: Diagnosis not present

## 2022-07-07 DIAGNOSIS — G4700 Insomnia, unspecified: Secondary | ICD-10-CM | POA: Diagnosis not present

## 2022-07-07 DIAGNOSIS — K219 Gastro-esophageal reflux disease without esophagitis: Secondary | ICD-10-CM | POA: Diagnosis not present

## 2022-07-07 DIAGNOSIS — I10 Essential (primary) hypertension: Secondary | ICD-10-CM | POA: Diagnosis not present

## 2022-07-07 DIAGNOSIS — Z96651 Presence of right artificial knee joint: Secondary | ICD-10-CM | POA: Diagnosis not present

## 2022-07-07 DIAGNOSIS — M62838 Other muscle spasm: Secondary | ICD-10-CM | POA: Diagnosis not present

## 2022-07-08 DIAGNOSIS — M25561 Pain in right knee: Secondary | ICD-10-CM | POA: Diagnosis not present

## 2022-07-08 DIAGNOSIS — M25661 Stiffness of right knee, not elsewhere classified: Secondary | ICD-10-CM | POA: Diagnosis not present

## 2022-07-10 DIAGNOSIS — M25661 Stiffness of right knee, not elsewhere classified: Secondary | ICD-10-CM | POA: Diagnosis not present

## 2022-07-10 DIAGNOSIS — M25561 Pain in right knee: Secondary | ICD-10-CM | POA: Diagnosis not present

## 2022-07-13 DIAGNOSIS — M25561 Pain in right knee: Secondary | ICD-10-CM | POA: Diagnosis not present

## 2022-07-13 DIAGNOSIS — M25661 Stiffness of right knee, not elsewhere classified: Secondary | ICD-10-CM | POA: Diagnosis not present

## 2022-07-15 DIAGNOSIS — M25661 Stiffness of right knee, not elsewhere classified: Secondary | ICD-10-CM | POA: Diagnosis not present

## 2022-07-15 DIAGNOSIS — M25561 Pain in right knee: Secondary | ICD-10-CM | POA: Diagnosis not present

## 2022-07-17 DIAGNOSIS — M25661 Stiffness of right knee, not elsewhere classified: Secondary | ICD-10-CM | POA: Diagnosis not present

## 2022-07-17 DIAGNOSIS — M25561 Pain in right knee: Secondary | ICD-10-CM | POA: Diagnosis not present

## 2022-07-21 DIAGNOSIS — Z96651 Presence of right artificial knee joint: Secondary | ICD-10-CM | POA: Diagnosis not present

## 2022-07-21 DIAGNOSIS — Z471 Aftercare following joint replacement surgery: Secondary | ICD-10-CM | POA: Diagnosis not present

## 2022-07-21 DIAGNOSIS — M25561 Pain in right knee: Secondary | ICD-10-CM | POA: Diagnosis not present

## 2022-07-21 DIAGNOSIS — M25661 Stiffness of right knee, not elsewhere classified: Secondary | ICD-10-CM | POA: Diagnosis not present

## 2022-07-24 DIAGNOSIS — M25561 Pain in right knee: Secondary | ICD-10-CM | POA: Diagnosis not present

## 2022-07-24 DIAGNOSIS — M25661 Stiffness of right knee, not elsewhere classified: Secondary | ICD-10-CM | POA: Diagnosis not present

## 2022-07-28 DIAGNOSIS — M25661 Stiffness of right knee, not elsewhere classified: Secondary | ICD-10-CM | POA: Diagnosis not present

## 2022-07-28 DIAGNOSIS — M25561 Pain in right knee: Secondary | ICD-10-CM | POA: Diagnosis not present

## 2022-07-31 DIAGNOSIS — G8929 Other chronic pain: Secondary | ICD-10-CM | POA: Diagnosis not present

## 2022-07-31 DIAGNOSIS — M12812 Other specific arthropathies, not elsewhere classified, left shoulder: Secondary | ICD-10-CM | POA: Diagnosis not present

## 2022-07-31 DIAGNOSIS — M25532 Pain in left wrist: Secondary | ICD-10-CM | POA: Diagnosis not present

## 2022-08-18 DIAGNOSIS — M12812 Other specific arthropathies, not elsewhere classified, left shoulder: Secondary | ICD-10-CM | POA: Diagnosis not present

## 2022-08-18 DIAGNOSIS — M25512 Pain in left shoulder: Secondary | ICD-10-CM | POA: Diagnosis not present

## 2022-08-18 DIAGNOSIS — G8929 Other chronic pain: Secondary | ICD-10-CM | POA: Diagnosis not present

## 2022-08-18 DIAGNOSIS — M25511 Pain in right shoulder: Secondary | ICD-10-CM | POA: Diagnosis not present

## 2022-08-18 DIAGNOSIS — M12811 Other specific arthropathies, not elsewhere classified, right shoulder: Secondary | ICD-10-CM | POA: Diagnosis not present

## 2022-08-20 DIAGNOSIS — H43393 Other vitreous opacities, bilateral: Secondary | ICD-10-CM | POA: Diagnosis not present

## 2022-08-20 DIAGNOSIS — Z961 Presence of intraocular lens: Secondary | ICD-10-CM | POA: Diagnosis not present

## 2022-08-20 DIAGNOSIS — H2511 Age-related nuclear cataract, right eye: Secondary | ICD-10-CM | POA: Diagnosis not present

## 2022-08-23 NOTE — Progress Notes (Unsigned)
Cardiology Office Note:    Date:  08/25/2022   ID:  Bill Johnson, DOB [birth year], MRN [id number]  PCP:  Elder Negus, NP   [facility] Providers Cardiologist:  None {   Referring MD: Elder Negus, NP    History of Present Illness:    Bill Johnson is a 67 y.o. male with a hx of HTN, GERD with esophagitis, COPD, OA, cervical spondylosis, anxiety, tobacco and alcohol abuse, CAD s/p DESx2 to o-mLAD 07/05/19 for CP and +CCTA, then DES to 95% OM1 stenosis 07/20/19 for residual symptoms with recent cath for chest pain with stable disease who now presents to clinic for follow-up.  Patient admitted from 04/23/20-04/25/20 for chest pain. Cath done on that admission showed widely patent previously placed stents in mid-LAD (mild ISR), prox-to-mid LAD, and OM1 with no new lesions. He was then discharged on medical management. Of note, during previous admission for chest pain it was thought that his jailed diagonal may be the source of his discomfort.  Saw me in clinic on 12/30/20 as urgent visit where he was having significant chest pain with minimal exertion and decreased exercise capacity. We referred for repeat cath on 01/02/2021 patent LAD and OM stents, moderate D1 disease, moderate PDA disease which was overall stable. Recommended for continued medical management.  Was seen in clinic on  01/2021 where he was doing well. Was still having SOB so we referred to [facility] who recommended prn symbicort.  Was last seen 03/2022 by Robin Searing for pre-op evaluation prior to knee replacement. Was doing well from a CV standpoint.  Today, the patient overall feels okay. Recovering well from recent knee surgery. No chest pain, SOB, orthopnea, LE edema, or PND. Completed PT and did well. Blood pressure is well controlled. Tolerating medications as prescribed.    Past Medical History:  Diagnosis Date   Alcohol abuse    Allergy    seasonal   Anginal pain (HCC)    Anxiety    CAD (coronary  artery disease)    a. 06/2019 cath - s/p successful PCI of the ostial to mid LAD with IVUS guidance and DES x 2.   COPD (chronic obstructive pulmonary disease) (HCC)    Essential hypertension    GERD (gastroesophageal reflux disease)    HOH (hard of hearing)    Hyperlipidemia    Hypertension    OA (osteoarthritis) of knee    Pulmonary nodules    a. seen on coronary CT 05/2019, will need OP f/u.   Rotator cuff tear arthropathy of both shoulders    On DISABILITY, non operative case   Shoulder pain    Tobacco abuse    Vertigo     Past Surgical History:  Procedure Laterality Date   CARDIAC CATHETERIZATION  06/2005   no significant CAD   CATARACT EXTRACTION Left    COLONOSCOPY WITH PROPOFOL N/A 05/01/2019   Procedure: COLONOSCOPY WITH PROPOFOL;  Surgeon: Corbin Ade, MD;  [facility];  Service: Endoscopy;  Laterality: N/A;  7:30am   COLONOSCOPY WITH PROPOFOL N/A 03/07/2020   Procedure: COLONOSCOPY WITH PROPOFOL;  Surgeon: Corbin Ade, MD;  [facility]: [facility];  Service: Endoscopy;  Laterality: N/A;  8:30am   CORONARY PRESSURE/FFR STUDY N/A 07/05/2019   Procedure: INTRAVASCULAR PRESSURE WIRE/FFR STUDY;  Surgeon: Swaziland, Peter M, MD;  Location: [facility] INVASIVE CV LAB;  Service: Cardiovascular;  Laterality: N/A;   CORONARY STENT INTERVENTION N/A 07/05/2019   Procedure: CORONARY STENT INTERVENTION;  Surgeon: Swaziland, Peter  M, MD;  Location: [facility] INVASIVE CV LAB;  Service: Cardiovascular;  Laterality: N/A;   CORONARY STENT INTERVENTION N/A 07/20/2019   Procedure: CORONARY STENT INTERVENTION;  Surgeon: Corky Crafts, MD;  Location: [facility] INVASIVE CV LAB;  Service: Cardiovascular;  Laterality: N/A;   CORONARY ULTRASOUND/IVUS N/A 07/05/2019   Procedure: Intravascular Ultrasound/IVUS;  Surgeon: Swaziland, Peter M, MD;  Location: [facility] INVASIVE CV LAB;  Service: Cardiovascular;  Laterality: N/A;   ESOPHAGOGASTRODUODENOSCOPY  08/2005   Dr. Marina Goodell: small hiatal hernia   HERNIA REPAIR   1983   KNEE ARTHROPLASTY Right 06/10/2022   Procedure: COMPUTER ASSISTED TOTAL KNEE ARTHROPLASTY;  Surgeon: Samson Frederic, MD;  Location: [facility];  Service: Orthopedics;  Laterality: Right;  150   LEFT HEART CATH AND CORONARY ANGIOGRAPHY N/A 07/05/2019   Procedure: LEFT HEART CATH AND CORONARY ANGIOGRAPHY;  Surgeon: Swaziland, Peter M, MD;  Location: [facility] INVASIVE CV LAB;  Service: Cardiovascular;  Laterality: N/A;   LEFT HEART CATH AND CORONARY ANGIOGRAPHY N/A 07/20/2019   Procedure: LEFT HEART CATH AND CORONARY ANGIOGRAPHY;  Surgeon: Corky Crafts, MD;  Location: [facility] INVASIVE CV LAB;  Service: Cardiovascular;  Laterality: N/A;   LEFT HEART CATH AND CORONARY ANGIOGRAPHY N/A 04/24/2020   Procedure: LEFT HEART CATH AND CORONARY ANGIOGRAPHY;  Surgeon: Lyn Records, MD;  Location: [facility] INVASIVE CV LAB;  Service: Cardiovascular;  Laterality: N/A;   LEFT HEART CATH AND CORONARY ANGIOGRAPHY N/A 01/02/2021   Procedure: LEFT HEART CATH AND CORONARY ANGIOGRAPHY;  Surgeon: Kathleene Hazel, MD;  Location: [facility] INVASIVE CV LAB;  Service: Cardiovascular;  Laterality: N/A;   POLYPECTOMY  05/01/2019   Procedure: POLYPECTOMY;  Surgeon: Corbin Ade, MD;  [facility];  Service: Endoscopy;;   POLYPECTOMY  03/07/2020   Procedure: POLYPECTOMY;  Surgeon: Corbin Ade, MD;  [facility];  Service: Endoscopy;;   ROTATOR CUFF REPAIR Bilateral    TONSILLECTOMY  1977    Current Medications: Current Meds  Medication Sig   acetaminophen (TYLENOL) 500 MG tablet Take 2 tablets (1,000 mg total) by mouth every 8 (eight) hours as needed for mild pain, headache, moderate pain or fever.   atorvastatin (LIPITOR) 80 MG tablet Take 1 tablet (80 mg total) by mouth every evening.   cetirizine (ZYRTEC) 10 MG tablet Take 10 mg by mouth daily.   Cholecalciferol (VITAMIN D3) 50 MCG (2000 UT) CAPS Take 2,000 Units by mouth daily.   clopidogrel (PLAVIX) 75 MG tablet Take 1 tablet (75 mg total) by  mouth daily.   CVS VITAMIN B-12 2000 MCG TBCR Take 2,000 mcg by mouth daily.   diazepam (VALIUM) 5 MG tablet Take 1 tablet (5 mg total) by mouth every 12 (twelve) hours as needed for anxiety.   Evolocumab (REPATHA SURECLICK) 140 MG/ML SOAJ Inject 140 mg into the skin every 14 (fourteen) days.   ezetimibe (ZETIA) 10 MG tablet Take 1 tablet (10 mg total) by mouth daily.   isosorbide mononitrate (IMDUR) 60 MG 24 hr tablet Take 1.5 tablets (90 mg total) by mouth daily.   meclizine (ANTIVERT) 25 MG tablet TAKE 1 TABLET BY MOUTH THREE TIMES DAILY AS NEEDED FOR DIZZINESS (Patient taking differently: Take 25 mg by mouth 3 (three) times daily as needed for dizziness or nausea.)   nitroGLYCERIN (NITROSTAT) 0.4 MG SL tablet Place 1 tablet (0.4 mg total) under the tongue every 5 (five) minutes as needed for chest pain (up to 3 doses).   ondansetron (ZOFRAN) 4 MG tablet Take 1 tablet (4  mg total) by mouth every 8 (eight) hours as needed for nausea or vomiting.   pantoprazole (PROTONIX) 40 MG tablet Take 1 tablet (40 mg total) by mouth 2 (two) times daily. (Patient taking differently: Take 40 mg by mouth daily.)   ranolazine (RANEXA) 1000 MG SR tablet Take 1 tablet (1,000 mg total) by mouth 2 (two) times daily.     Allergies:   Patient has no known allergies.   Social History   Socioeconomic History   Marital status: Divorced    Spouse name: Not on file   Number of children: 2   Years of education: 12   Highest education level: Not on file  Occupational History   Occupation: Self-employed Office manager)  Tobacco Use   Smoking status: Former    Current packs/day: 0.00    Average packs/day: 1 pack/day for 50.3 years (50.3 ttl pk-yrs)    Types: Cigarettes    Start date: 04/15/1970    Quit date: 07/26/2020    Years since quitting: 2.0   Smokeless tobacco: Never  Vaping Use   Vaping status: Never Used  Substance and Sexual Activity   Alcohol use: Not Currently    Comment: Drinks 3-4 beers/day    Drug  use: No   Sexual activity: Yes  Other Topics Concern   Not on file  Social History Narrative   Lives at home with nephew and niece in-law   Caffeine use: Drinks coffee/soda every day   Social Determinants of Health   Financial Resource Strain: Low Risk  (03/19/2022)   Received from [facility], [facility]   Overall Financial Resource Strain (CARDIA)    Difficulty of Paying Living Expenses: Not hard at all  Food Insecurity: No Food Insecurity (06/10/2022)   Hunger Vital Sign    Worried About Running Out of Food in the Last Year: Never true    Ran Out of Food in the Last Year: Never true  Transportation Needs: No Transportation Needs (06/10/2022)   PRAPARE - Administrator, Civil Service (Medical): No    Lack of Transportation (Non-Medical): No  Physical Activity: Unknown (09/02/2021)   Received from [facility], [facility]   Exercise Vital Sign    Days of Exercise per Week: 0 days    Minutes of Exercise per Session: Not on file  Stress: No Stress Concern Present (09/02/2021)   Received from [facility], [facility] - Occupational Stress Questionnaire    Feeling of Stress : Only a little  Social Connections: Socially Isolated (09/02/2021)   Received from [facility], [facility]   Social Network    How would you rate your social network (family, work, friends)?: Little participation, lonely and socially isolated     Family History: The patient's family history includes Cancer in his brother and another family member; Diabetes in his mother; Heart disease in his father and mother; Hypertension in his father and mother; Stroke in an other family member. There is no history of Colon cancer.  ROS:   Please see the history of present illness.       EKGs/Labs/Other Studies Reviewed:    The following studies were reviewed today: ECG 26-Mar-2022: NSR with HR 68  01/02/21:   2nd Diag lesion is 60% stenosed.   RPDA  lesion is 60% stenosed.   RV Branch lesion is 10% stenosed.   1st Mrg lesion is 10% stenosed.   Mid LAD lesion is 30% stenosed.   The  left ventricular systolic function is normal.   LV end diastolic pressure is normal.   The left ventricular ejection fraction is 55-65% by visual estimate.   There is no mitral valve regurgitation.   Patent mid LAD stents without significant restenosis. Moderate ostial Diagonal branch stenosis, jailed by LAD stent with normal flow.  Patent obtuse marginal branch stent without restenosis. This vessel arises from what may be a large intermediate branch. 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 The proximal segment of the PDA has a 50-60% stenosis that is unchanged from prior cardiac caths.  Normal LV systolic function.  Mild elevation LV filling pressures, LVEDP 17 mmHg.    Recommendations: Continue medical management of CAD. I suspect that his dyspnea is secondary to deconditioning but cannot exclude microvascular disease.    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  The vessel is very small in size and contains luminal irregularities. The right coronary is dominant.  The PDA arises from the distal portion of the mid vessel.  The origin of the PDA contains a 50 to 60% eccentric narrowing. Left main is widely patent. The left main gives origin to the LAD and what should be considered a ramus intermedius that then bifurcates.  The superior most branch of the ramus bifurcation contains a patent stent. The LAD is a large vessel and contains an ostial to mid vessel stent the jails a diagonal distally.  The diagonal contains ostial 50 to 70% narrowing. Normal left ventricular function.  LVEF 55%.   RECOMMENDATIONS:   The  previously placed stents are widely patent. Ongoing chest pain without enzyme abnormality or EKG changes suggests the possibility of an alternative explanation. Per treating team   Diagnostic Dominance: Right           Echocardiogram: 02/2019 IMPRESSIONS   1. Left ventricular ejection fraction, by estimation, is 55 to 60%. The  left ventricle has normal function. The left ventricle has no regional  wall motion abnormalities. Left ventricular diastolic parameters were  normal.   2. Right ventricular systolic function is normal. The right ventricular  size is normal. Tricuspid regurgitation signal is inadequate for assessing  PA pressure.   3. Left atrial size was upper normal.   4. Right atrial size was upper normal.   5. The mitral valve is grossly normal. Trivial mitral valve  regurgitation.   6. The aortic valve is tricuspid. Aortic valve regurgitation is mild.   7. The inferior vena cava is normal in size with greater than 50%  respiratory variability, suggesting right atrial pressure of 3 mmHg   Cardiac catheterization 07/05/2019   Prox LAD to Mid LAD lesion is 70% stenosed. A drug-eluting stent was successfully placed using a STENT RESOLUTE ONYX 3.0X22. Post intervention, there is a 0% residual stenosis. Mid LAD lesion is 50% stenosed. A drug-eluting stent was successfully placed using a STENT RESOLUTE ONYX 2.5X30. Post intervention, there is a 0% residual stenosis. 1st Mrg lesion is 95% stenosed. The left ventricular systolic function is normal. LV end diastolic pressure is normal. The left ventricular ejection fraction is 55-65% by visual estimate.   1. Severe 2 vessel obstructive CAD. Abnormal FFR of the LAD. 2. Normal LV function 3. Normal LVEDP 4. Successful PCI of  the ostial to mid LAD with IVUS guidance and DES x 2.    Plan: DAPT for one year. Optimize medical therapy. Smoking cessation. If patient continues to have symptoms despite optimal therapy could  consider PCI of the OM but this is a small branch and supplies a relatively small area of myocardium.   Diagnostic Dominance: Right   Intervention         Cardiac catheterization 07/20/2019   Previously placed Mid LAD drug eluting stent is widely patent. Previously placed Prox LAD to Mid LAD drug eluting stent is widely patent. 1st Mrg lesion is 95% stenosed. A drug-eluting stent was successfully placed using a STENT RESOLUTE ONYX 2.25X26, postdilated to 2.6 mm. Post intervention, there is a 0% residual stenosis. The left ventricular systolic function is normal. LV end diastolic pressure is normal. The left ventricular ejection fraction is 55-65% by visual estimate. There is no aortic valve stenosis.   Continue aggressive secondary prevention.  Consider clopidogrel monotherapy after 12 months.    Watch overnight.  Home tomorrow.    He is interested in getting the COVID-19 vaccine.  I encouraged him to get the vaccines when possible.   Diagnostic Dominance: Right Intervention         Recent Labs: 06/11/2022: BUN 6; Creatinine, Ser 0.77; Hemoglobin 11.0; Platelets 210; Potassium 3.8; Sodium 131  Recent Lipid Panel    Component Value Date/Time   CHOL 59 (L) 05/05/2021 1007   TRIG 51 05/05/2021 1007   HDL 43 05/05/2021 1007   CHOLHDL 1.4 05/05/2021 1007   CHOLHDL 3.2 04/24/2020 0400   VLDL 41 (H) 04/24/2020 0400   LDLCALC 2 05/05/2021 1007   LDLCALC 71 09/01/2019 0859           Physical Exam:    VS:  BP 126/72   Pulse 85   Ht 5\' 6"  (1.676 m)   Wt 164 lb (74.4 kg)   SpO2 95%   BMI 26.47 kg/m     Wt Readings from Last 3 Encounters:  08/25/22 164 lb (74.4 kg)  06/10/22 176 lb (79.8 kg)  06/03/22 176 lb (79.8 kg)     GEN:  Comfortable, NAD HEENT: Normal NECK: No JVD; No carotid bruits CARDIAC: RRR, no murmurs RESPIRATORY:  Clear bilaterally with no wheezing ABDOMEN: Soft, non-tender, non-distended MUSCULOSKELETAL:  Right knee brace in place. No  edema SKIN: Warm and dry NEUROLOGIC:  Alert and oriented x 3 PSYCHIATRIC:  Normal affect   ASSESSMENT:    1. CAD, multiple vessel   2. Essential hypertension   3. Mixed hyperlipidemia   4. Tobacco abuse   5. Hypercholesterolemia   6. Pulmonary emphysema, unspecified emphysema type (HCC)        PLAN:    In order of problems listed above:  #Multivessel CAD s/p PCI to LAD and OM: Last cath on 12/2020 for chest pain with patent LAD and OM stents, 60% D2, 30% mLAD, 60% RPDA which was overall stable from prior. Recommended for continued medical management. Currently, doing well without exertional symptoms. -TTE 02/2022 with LVEF 55-60%, normal RV, trivial MR, mild AR -Continue ASA 81mg  daily -Continue lipitor 80mg  daily -Continue zetia 10mg  daily -Continue plavix 75mg  daily -Continue imdur 90mg  daily -Continue ranexa 1000mg  BID -Patient quit smoking -Continue diet and exercising efforts as detailed below -Will refill nitro for chest pain -If symptoms do not improve, can pursue ischemic testing at that time  #HTN: Well controlled. Off atenolol and amlodipine due to orthostasis -Continue imdur 90mg  daily  #  HLD: LDL 2; goal <55.  -Continue lipitor 80mg  daily -Continue zetia 10mg  daily -Continue repatha -Repeat lipids at time of appointment with Dr. Duard Brady Abuse: -Quit successfully  #Overweight: -Continue diet and lifestyle modifications as below  Exercise recommendations: Goal of exercising for at least 30 minutes a day, at least 5 times per week.  Please exercise to a moderate exertion.  This means that while exercising it is difficult to speak in full sentences, however you are not so short of breath that you feel you must stop, and not so comfortable that you can carry on a full conversation.  Exertion level should be approximately a 5/10, if 10 is the most exertion you can perform.  Diet recommendations: Recommend a heart healthy diet such as the  Mediterranean diet.  This diet consists of plant based foods, healthy fats, lean meats, olive oil.  It suggests limiting the intake of simple carbohydrates such as white breads, pastries, and pastas.  It also limits the amount of red meat, wine, and dairy products such as cheese that one should consume on a daily basis.       Medication Adjustments/Labs and Tests Ordered: Current medicines are reviewed at length with the patient today.  Concerns regarding medicines are outlined above.  No orders of the defined types were placed in this encounter.  No orders of the defined types were placed in this encounter.   Patient Instructions  Medication Instructions:   Your physician recommends that you continue on your current medications as directed. Please refer to the Current Medication list given to you today.  *If you need a refill on your cardiac medications before your next appointment, please call your pharmacy*    Follow-Up: At [facility], you and your health needs are our priority.  As part of our continuing mission to provide you with exceptional heart care, we have created designated Provider Care Teams.  These Care Teams include your primary Cardiologist (physician) and Advanced Practice Providers (APPs -  Physician Assistants and Nurse Practitioners) who all work together to provide you with the care you need, when you need it.  We recommend signing up for the patient portal called "MyChart".  Sign up information is provided on this After Visit Summary.  MyChart is used to connect with patients for Virtual Visits (Telemedicine).  Patients are able to view lab/test results, encounter notes, upcoming appointments, etc.  Non-urgent messages can be sent to your provider as well.   To learn more about what you can do with MyChart, go to [email or web address].    Your next appointment:   6 month(s) with lipids at that visit  Provider:   Dina Rich, MD          Signed, Meriam Sprague, MD  08/25/2022 4:10 PM    [facility]

## 2022-08-25 ENCOUNTER — Ambulatory Visit: Payer: 59 | Attending: Cardiology | Admitting: Cardiology

## 2022-08-25 ENCOUNTER — Encounter: Payer: Self-pay | Admitting: Cardiology

## 2022-08-25 VITALS — BP 126/72 | HR 85 | Ht 66.0 in | Wt 164.0 lb

## 2022-08-25 DIAGNOSIS — J439 Emphysema, unspecified: Secondary | ICD-10-CM | POA: Diagnosis not present

## 2022-08-25 DIAGNOSIS — E7800 Pure hypercholesterolemia, unspecified: Secondary | ICD-10-CM | POA: Diagnosis not present

## 2022-08-25 DIAGNOSIS — E782 Mixed hyperlipidemia: Secondary | ICD-10-CM | POA: Diagnosis not present

## 2022-08-25 DIAGNOSIS — Z720 Tobacco use: Secondary | ICD-10-CM

## 2022-08-25 DIAGNOSIS — I10 Essential (primary) hypertension: Secondary | ICD-10-CM

## 2022-08-25 DIAGNOSIS — I2510 Atherosclerotic heart disease of native coronary artery without angina pectoris: Secondary | ICD-10-CM

## 2022-08-25 NOTE — Patient Instructions (Signed)
Medication Instructions:   Your physician recommends that you continue on your current medications as directed. Please refer to the Current Medication list given to you today.  *If you need a refill on your cardiac medications before your next appointment, please call your pharmacy*    Follow-Up: At [facility], you and your health needs are our priority.  As part of our continuing mission to provide you with exceptional heart care, we have created designated Provider Care Teams.  These Care Teams include your primary Cardiologist (physician) and Advanced Practice Providers (APPs -  Physician Assistants and Nurse Practitioners) who all work together to provide you with the care you need, when you need it.  We recommend signing up for the patient portal called "MyChart".  Sign up information is provided on this After Visit Summary.  MyChart is used to connect with patients for Virtual Visits (Telemedicine).  Patients are able to view lab/test results, encounter notes, upcoming appointments, etc.  Non-urgent messages can be sent to your provider as well.   To learn more about what you can do with MyChart, go to [email or web address].    Your next appointment:   6 month(s) with lipids at that visit  Provider:   Dina Rich, MD

## 2022-10-07 DIAGNOSIS — R7303 Prediabetes: Secondary | ICD-10-CM | POA: Diagnosis not present

## 2022-10-07 DIAGNOSIS — H9191 Unspecified hearing loss, right ear: Secondary | ICD-10-CM | POA: Diagnosis not present

## 2022-10-07 DIAGNOSIS — I700 Atherosclerosis of aorta: Secondary | ICD-10-CM | POA: Diagnosis not present

## 2022-10-07 DIAGNOSIS — Z23 Encounter for immunization: Secondary | ICD-10-CM | POA: Diagnosis not present

## 2022-10-07 DIAGNOSIS — E782 Mixed hyperlipidemia: Secondary | ICD-10-CM | POA: Diagnosis not present

## 2022-10-07 DIAGNOSIS — G4700 Insomnia, unspecified: Secondary | ICD-10-CM | POA: Diagnosis not present

## 2022-10-07 DIAGNOSIS — I10 Essential (primary) hypertension: Secondary | ICD-10-CM | POA: Diagnosis not present

## 2022-10-07 DIAGNOSIS — J449 Chronic obstructive pulmonary disease, unspecified: Secondary | ICD-10-CM | POA: Diagnosis not present

## 2022-10-19 DIAGNOSIS — M47812 Spondylosis without myelopathy or radiculopathy, cervical region: Secondary | ICD-10-CM | POA: Diagnosis not present

## 2022-11-02 DIAGNOSIS — M12812 Other specific arthropathies, not elsewhere classified, left shoulder: Secondary | ICD-10-CM | POA: Diagnosis not present

## 2022-11-02 DIAGNOSIS — M25511 Pain in right shoulder: Secondary | ICD-10-CM | POA: Diagnosis not present

## 2022-11-02 DIAGNOSIS — M12811 Other specific arthropathies, not elsewhere classified, right shoulder: Secondary | ICD-10-CM | POA: Diagnosis not present

## 2022-11-02 DIAGNOSIS — G8929 Other chronic pain: Secondary | ICD-10-CM | POA: Diagnosis not present

## 2022-11-02 DIAGNOSIS — G5602 Carpal tunnel syndrome, left upper limb: Secondary | ICD-10-CM | POA: Diagnosis not present

## 2022-11-09 DIAGNOSIS — G5602 Carpal tunnel syndrome, left upper limb: Secondary | ICD-10-CM | POA: Diagnosis not present

## 2022-11-09 DIAGNOSIS — M19032 Primary osteoarthritis, left wrist: Secondary | ICD-10-CM | POA: Diagnosis not present

## 2022-11-09 DIAGNOSIS — M25532 Pain in left wrist: Secondary | ICD-10-CM | POA: Diagnosis not present

## 2022-11-17 DIAGNOSIS — H25011 Cortical age-related cataract, right eye: Secondary | ICD-10-CM | POA: Diagnosis not present

## 2022-11-17 DIAGNOSIS — H25041 Posterior subcapsular polar age-related cataract, right eye: Secondary | ICD-10-CM | POA: Diagnosis not present

## 2022-11-17 DIAGNOSIS — H2511 Age-related nuclear cataract, right eye: Secondary | ICD-10-CM | POA: Diagnosis not present

## 2022-11-17 DIAGNOSIS — Z961 Presence of intraocular lens: Secondary | ICD-10-CM | POA: Diagnosis not present

## 2022-11-23 DIAGNOSIS — M79672 Pain in left foot: Secondary | ICD-10-CM | POA: Diagnosis not present

## 2022-11-23 DIAGNOSIS — L110 Acquired keratosis follicularis: Secondary | ICD-10-CM | POA: Diagnosis not present

## 2022-11-23 DIAGNOSIS — L609 Nail disorder, unspecified: Secondary | ICD-10-CM | POA: Diagnosis not present

## 2022-11-23 DIAGNOSIS — M79674 Pain in right toe(s): Secondary | ICD-10-CM | POA: Diagnosis not present

## 2022-11-23 DIAGNOSIS — M79675 Pain in left toe(s): Secondary | ICD-10-CM | POA: Diagnosis not present

## 2022-11-23 DIAGNOSIS — M79671 Pain in right foot: Secondary | ICD-10-CM | POA: Diagnosis not present

## 2022-11-23 DIAGNOSIS — I739 Peripheral vascular disease, unspecified: Secondary | ICD-10-CM | POA: Diagnosis not present

## 2022-11-30 DIAGNOSIS — H25041 Posterior subcapsular polar age-related cataract, right eye: Secondary | ICD-10-CM | POA: Diagnosis not present

## 2022-11-30 DIAGNOSIS — H2511 Age-related nuclear cataract, right eye: Secondary | ICD-10-CM | POA: Diagnosis not present

## 2022-11-30 DIAGNOSIS — H25011 Cortical age-related cataract, right eye: Secondary | ICD-10-CM | POA: Diagnosis not present

## 2022-12-04 ENCOUNTER — Telehealth: Payer: Self-pay

## 2022-12-04 DIAGNOSIS — E782 Mixed hyperlipidemia: Secondary | ICD-10-CM

## 2022-12-04 DIAGNOSIS — I2510 Atherosclerotic heart disease of native coronary artery without angina pectoris: Secondary | ICD-10-CM

## 2022-12-04 DIAGNOSIS — I25118 Atherosclerotic heart disease of native coronary artery with other forms of angina pectoris: Secondary | ICD-10-CM

## 2022-12-04 MED ORDER — RANOLAZINE ER 1000 MG PO TB12
1000.0000 mg | ORAL_TABLET | Freq: Two times a day (BID) | ORAL | 0 refills | Status: DC
Start: 2022-12-04 — End: 2023-03-02

## 2022-12-04 NOTE — Telephone Encounter (Signed)
Per front office staff the pt walked in requesting a refill of Ranolazine SR  1000mg  1 tablet by mouth twice daily.  Pt requests Rx be sent to [facility], [place].  Pt is scheduled to see Dr Jens Som 03/02/2023.  Ranolazine SR 1000mg  - 1 tablet twice daily #90 with 0 RF sent to pharmacy as requested

## 2022-12-31 DIAGNOSIS — M79674 Pain in right toe(s): Secondary | ICD-10-CM | POA: Diagnosis not present

## 2022-12-31 DIAGNOSIS — M2011 Hallux valgus (acquired), right foot: Secondary | ICD-10-CM | POA: Diagnosis not present

## 2022-12-31 DIAGNOSIS — M1990 Unspecified osteoarthritis, unspecified site: Secondary | ICD-10-CM | POA: Diagnosis not present

## 2022-12-31 DIAGNOSIS — M79671 Pain in right foot: Secondary | ICD-10-CM | POA: Diagnosis not present

## 2022-12-31 DIAGNOSIS — M7751 Other enthesopathy of right foot: Secondary | ICD-10-CM | POA: Diagnosis not present

## 2023-01-11 ENCOUNTER — Ambulatory Visit (INDEPENDENT_AMBULATORY_CARE_PROVIDER_SITE_OTHER): Payer: 59

## 2023-01-11 ENCOUNTER — Ambulatory Visit (INDEPENDENT_AMBULATORY_CARE_PROVIDER_SITE_OTHER): Payer: 59 | Admitting: Podiatry

## 2023-01-11 ENCOUNTER — Encounter: Payer: Self-pay | Admitting: Podiatry

## 2023-01-11 DIAGNOSIS — M19071 Primary osteoarthritis, right ankle and foot: Secondary | ICD-10-CM | POA: Diagnosis not present

## 2023-01-11 DIAGNOSIS — M2011 Hallux valgus (acquired), right foot: Secondary | ICD-10-CM

## 2023-01-11 NOTE — Progress Notes (Signed)
Subjective:  Patient ID: CHRISTROPHER UNTIEDT, male    DOB: 1955-03-21,  MRN: [id number]  Chief Complaint  Patient presents with   Foot Pain    "My toe is crossing over the other toe and I have this big knot."    Discussed the use of AI scribe software for clinical note transcription with the patient, who gave verbal consent to proceed.  History of Present Illness   The patient presents with a chief complaint of a painful foot, specifically a large knot that has developed over the last six weeks. The patient has a history of arthritis and has previously undergone knee replacement surgery. The patient reports that the foot pain began with an overlapping toe, which was initially managed with cotton for separation. However, the development of the knot has exacerbated the pain, causing the patient to modify his gait and walk on the side of the foot.  The patient has previously received cortisone shots for shoulder pain and steroid injections in the neck, but is reluctant to undergo similar treatment for the foot. The patient also has a history of heart disease, with three stents placed in the past and a diagnosis of angina. The patient reports occasional angina pain, but no breathing issues. The patient is currently on Plavix, a blood thinner, and takes cholesterol injections every two weeks.  The patient lives alone and has concerns about post-operative recovery, particularly mobility issues due to the recent knee surgery. The patient also has a pet dog to care for, which further complicates the recovery process. The patient is scheduled to see a cardiologist in February for preoperative clearance.          Objective:    Physical Exam   MUSCULOSKELETAL: Dorsal, medial, and lateral spurring around right first MTP joint. Hallux limitus with pain on attempted range of motion and crepitus. Severe narrowing of first metatarsophalangeal joint space with dorsal osteophytosis and hallux valgus deformity.  Strong palpable DP and PT pulse on the right foot.       No images are attached to the encounter.    Results   Procedure: Corticosteroid injection Description: Following sterile prep with Betadine, 20 mg of Kenalog, 4 mg of dexamethasone, and 0.5 mL of 2% lidocaine plain were injected into the right first MTP joint through a dorsal approach. Informed Consent: Discussed the risks, benefits, and potential complications of the procedure, including infection, bone and skin healing, and the recovery process. Informed consent was signed.  RADIOLOGY Right foot X-ray: Severe narrowing of the first metatarsophalangeal joint space with dorsal osteophytosis and hallux valgus deformity      Assessment:   1. Hav (hallux abducto valgus), right   2. Osteoarthritis of first metatarsophalangeal (MTP) joint of right foot      Plan:  Patient was evaluated and treated and all questions answered.  Assessment and Plan    First Metatarsophalangeal (MTP) Joint Arthritis Severe narrowing of the joint space, dorsal osteophytosis, and hallux valgus deformity have led to pain and limited range of motion for him. We discussed surgical and non-surgical options. For temporary relief, we administered a corticosteroid injection into the right first MTP joint, consisting of 20mg  Kenalog, 4mg  Dexamethasone, and 0.5cc 2% Lidocaine plain. We scheduled fusion of the first MTP joint for the end of February 2025, pending preoperative clearance from a cardiologist.  We discussed the recovery process from surgical fusion.  We discussed typically I prefer to have nonweightbearing for 3 to 4 weeks in the postoperative period.  We discussed that gradual protected weightbearing could be possible with use of a walker and a CAM boot.  We discussed the risk benefits and potential complications of the procedure including but not limited to pain, swelling, infection, scar, numbness which may be temporary or permanent, chronic pain,  stiffness, nerve pain or damage, wound healing problems, bone healing problems including delayed or non-union.  He understands wishes to proceed.  Informed sent signed and reviewed.   Angina/Heart Disease He reports intermittent angina, currently managed with Plavix and Tor injections every two weeks. A cardiology follow-up is scheduled for March 02, 2023, for preoperative clearance.  Post Knee Replacement Surgery He is still experiencing difficulty bending the knee after surgery in May 2024. We encouraged him to continue working on knee mobility and strength prior to foot surgery.  General Health Maintenance / Followup Plans Preoperative clearance from a cardiologist is scheduled for February 2025.        Surgical plan:  Procedure: -Right first MTP fusion  Location: -[facility]  Anesthesia plan: -General  Postoperative pain plan: - Tylenol 1000 mg every 6 hours, gabapentin 300 mg every 8 hours x5 days, oxycodone 5 mg 1-2 tabs every 6 hours only as needed  DVT prophylaxis: -He will resume his Plavix post surgery  WB Restrictions / DME needs: -Weightbearing to heel in cam boot with walker    No follow-ups on file.

## 2023-01-13 DIAGNOSIS — I2089 Other forms of angina pectoris: Secondary | ICD-10-CM | POA: Diagnosis not present

## 2023-01-13 DIAGNOSIS — M62838 Other muscle spasm: Secondary | ICD-10-CM | POA: Diagnosis not present

## 2023-01-13 DIAGNOSIS — M2011 Hallux valgus (acquired), right foot: Secondary | ICD-10-CM | POA: Diagnosis not present

## 2023-01-13 DIAGNOSIS — R7303 Prediabetes: Secondary | ICD-10-CM | POA: Diagnosis not present

## 2023-01-13 DIAGNOSIS — Z96651 Presence of right artificial knee joint: Secondary | ICD-10-CM | POA: Diagnosis not present

## 2023-01-13 DIAGNOSIS — I10 Essential (primary) hypertension: Secondary | ICD-10-CM | POA: Diagnosis not present

## 2023-01-13 DIAGNOSIS — Z0001 Encounter for general adult medical examination with abnormal findings: Secondary | ICD-10-CM | POA: Diagnosis not present

## 2023-01-18 ENCOUNTER — Telehealth: Payer: Self-pay

## 2023-01-18 NOTE — Telephone Encounter (Signed)
[name] stopped by the office to let me know he doesn't want to have surgery at this time. He had a knee replacement recently and would like to wait on toe surgery.

## 2023-01-28 DIAGNOSIS — M47812 Spondylosis without myelopathy or radiculopathy, cervical region: Secondary | ICD-10-CM | POA: Diagnosis not present

## 2023-01-29 ENCOUNTER — Telehealth: Payer: Self-pay

## 2023-01-29 DIAGNOSIS — M25532 Pain in left wrist: Secondary | ICD-10-CM | POA: Diagnosis not present

## 2023-01-29 DIAGNOSIS — G5602 Carpal tunnel syndrome, left upper limb: Secondary | ICD-10-CM | POA: Diagnosis not present

## 2023-01-29 NOTE — Telephone Encounter (Signed)
 Preop televisit has now been scheduled, med rec and consent done.

## 2023-01-29 NOTE — Telephone Encounter (Signed)
  Patient Consent for Virtual Visit        Bill Johnson has provided verbal consent on 01/29/2023 for a virtual visit (video or telephone).   CONSENT FOR VIRTUAL VISIT FOR:  Bill Johnson  By participating in this virtual visit I agree to the following:  I hereby voluntarily request, consent and authorize [facility] and its employed or contracted physicians, physician assistants, nurse practitioners or other licensed health care professionals (the Practitioner), to provide me with telemedicine health care services (the "Services) as deemed necessary by the treating Practitioner. I acknowledge and consent to receive the Services by the Practitioner via telemedicine. I understand that the telemedicine visit will involve communicating with the Practitioner through live audiovisual communication technology and the disclosure of certain medical information by electronic transmission. I acknowledge that I have been given the opportunity to request an in-person assessment or other available alternative prior to the telemedicine visit and am voluntarily participating in the telemedicine visit.  I understand that I have the right to withhold or withdraw my consent to the use of telemedicine in the course of my care at any time, without affecting my right to future care or treatment, and that the Practitioner or I may terminate the telemedicine visit at any time. I understand that I have the right to inspect all information obtained and/or recorded in the course of the telemedicine visit and may receive copies of available information for a reasonable fee.  I understand that some of the potential risks of receiving the Services via telemedicine include:  Delay or interruption in medical evaluation due to technological equipment failure or disruption; Information transmitted may not be sufficient (e.g. poor resolution of images) to allow for appropriate medical decision making by the  Practitioner; and/or  In rare instances, security protocols could fail, causing a breach of personal health information.  Furthermore, I acknowledge that it is my responsibility to provide information about my medical history, conditions and care that is complete and accurate to the best of my ability. I acknowledge that Practitioner's advice, recommendations, and/or decision may be based on factors not within their control, such as incomplete or inaccurate data provided by me or distortions of diagnostic images or specimens that may result from electronic transmissions. I understand that the practice of medicine is not an exact science and that Practitioner makes no warranties or guarantees regarding treatment outcomes. I acknowledge that a copy of this consent can be made available to me via my patient portal [facility] MyChart), or I can request a printed copy by calling the office of [facility].    I understand that my insurance will be billed for this visit.   I have read or had this consent read to me. I understand the contents of this consent, which adequately explains the benefits and risks of the Services being provided via telemedicine.  I have been provided ample opportunity to ask questions regarding this consent and the Services and have had my questions answered to my satisfaction. I give my informed consent for the services to be provided through the use of telemedicine in my medical care

## 2023-01-29 NOTE — Telephone Encounter (Signed)
 Primary Cardiologist:Brian Pietro, MD   Preoperative team, please contact this patient and set up a phone call appointment for further preoperative risk assessment. Please obtain consent and complete medication review. Thank you for your help.   Per previous agreement from primary cardiologist, and pending no symptoms at the time of the appointment, patient may hold Plavix  for 5 days prior to procedure and should resume as soon as hemodynamically stable.   I also confirmed the patient resides in the state of [place] . As per [facility] Medical Board telemedicine laws, the patient must reside in the state in which the provider is licensed.   Rosaline EMERSON Bane, NP-C  01/29/2023, 2:11 PM [address] Office [phone] Fax [phone]

## 2023-01-29 NOTE — Telephone Encounter (Signed)
   Pre-operative Risk Assessment    Patient Name: Bill Johnson  DOB: 1955/05/24 MRN: [id number]   Date of last office visit: 08/25/22 Dr. Hobart Date of next office visit: 03/02/23 Dr. Pietro   Request for Surgical Clearance    Procedure:   Lt Carpal Tunnel  Date of Surgery:  Clearance 02/10/23                                Surgeon:  None Surgeon's Group or Practice Name:  [facility] Phone number:  [phone] Fax number:  [phone]   Type of Clearance Requested:   - Medical  - Pharmacy:  Hold Clopidogrel  (Plavix )     Type of Anesthesia:  Not Indicated   Additional requests/questions:    [name] [name]   01/29/2023, 1:19 PM

## 2023-02-01 ENCOUNTER — Ambulatory Visit: Payer: 59 | Attending: Nurse Practitioner | Admitting: Nurse Practitioner

## 2023-02-01 ENCOUNTER — Encounter: Payer: Self-pay | Admitting: Nurse Practitioner

## 2023-02-01 DIAGNOSIS — Z01810 Encounter for preprocedural cardiovascular examination: Secondary | ICD-10-CM | POA: Diagnosis not present

## 2023-02-01 NOTE — Progress Notes (Signed)
 Virtual Visit via Telephone Note   Because of Bill Johnson's co-morbid illnesses, he is at least at moderate risk for complications without adequate follow up.  This format is felt to be most appropriate for this patient at this time.  The patient did not have access to video technology/had technical difficulties with video requiring transitioning to audio format only (telephone).  All issues noted in this document were discussed and addressed.  No physical exam could be performed with this format.  Please refer to the patient's chart for his consent to telehealth for [facility].  Evaluation Performed:  Preoperative cardiovascular risk assessment _____________   Date:  02/01/2023   Patient ID:  Bill Johnson, DOB 1955/05/06, MRN [id number] Patient Location:  Home Provider location:   Office  Primary Care Provider:  McClanahan, Kyra, NP Primary Cardiologist:  Redell Shallow, MD  Chief Complaint / Patient Profile   68 y.o. y/o male with a h/o hypertension, GERD with esophagitis, hyperlipidemia, COPD, OA, CAD s/p DES x 2 to o-mLAD 07/05/2019 and DES to OM1 07/20/2019, repeat cath 03/2020 for chest pain - patent stents, no new lesions, and former tobacco abuse who is pending left carpal tunnel repair on 02/10/23 and presents today for telephonic preoperative cardiovascular risk assessment.  History of Present Illness    Bill Johnson is a 68 y.o. male who presents via audio/video conferencing for a telehealth visit today.  Pt was last seen in cardiology clinic on 08/25/2022 by Dr. Hobart.  At that time Avary L Polanco was doing well.  The patient is now pending procedure as outlined above. Since his last visit, he denies chest pain, shortness of breath, lower extremity edema, fatigue, palpitations, melena, hematuria, hemoptysis, diaphoresis, weakness, presyncope, syncope, orthopnea, and PND. He has been somewhat limited by neck, knee, and shoulder pain remains active with  house work and yard work and is able to achieve > 4 METS activity without concerning cardiac symptoms.  Past Medical History    Past Medical History:  Diagnosis Date   Alcohol  abuse    Allergy    seasonal   Anginal pain (HCC)    Anxiety    CAD (coronary artery disease)    a. 06/2019 cath - s/p successful PCI of the ostial to mid LAD with IVUS guidance and DES x 2.   COPD (chronic obstructive pulmonary disease) (HCC)    Essential hypertension    GERD (gastroesophageal reflux disease)    HOH (hard of hearing)    Hyperlipidemia    Hypertension    OA (osteoarthritis) of knee    Pulmonary nodules    a. seen on coronary CT 05/2019, will need OP f/u.   Rotator cuff tear arthropathy of both shoulders    On DISABILITY, non operative case   Shoulder pain    Tobacco abuse    Vertigo    Past Surgical History:  Procedure Laterality Date   CARDIAC CATHETERIZATION  06/2005   no significant CAD   CATARACT EXTRACTION Left    COLONOSCOPY WITH PROPOFOL  N/A 05/01/2019   Procedure: COLONOSCOPY WITH PROPOFOL ;  Surgeon: Shaaron Lamar HERO, MD;  [facility];  Service: Endoscopy;  Laterality: N/A;  7:30am   COLONOSCOPY WITH PROPOFOL  N/A 03/07/2020   Procedure: COLONOSCOPY WITH PROPOFOL ;  Surgeon: Shaaron Lamar HERO, MD;  [facility]: [facility];  Service: Endoscopy;  Laterality: N/A;  8:30am   CORONARY PRESSURE/FFR STUDY N/A 07/05/2019   Procedure: INTRAVASCULAR PRESSURE WIRE/FFR STUDY;  Surgeon: Jordan, Peter  M, MD;  Location: [facility] INVASIVE CV LAB;  Service: Cardiovascular;  Laterality: N/A;   CORONARY STENT INTERVENTION N/A 07/05/2019   Procedure: CORONARY STENT INTERVENTION;  Surgeon: Jordan, Peter M, MD;  Location: [facility] INVASIVE CV LAB;  Service: Cardiovascular;  Laterality: N/A;   CORONARY STENT INTERVENTION N/A 07/20/2019   Procedure: CORONARY STENT INTERVENTION;  Surgeon: Dann Candyce RAMAN, MD;  Location: [facility] INVASIVE CV LAB;  Service: Cardiovascular;  Laterality: N/A;   CORONARY  ULTRASOUND/IVUS N/A 07/05/2019   Procedure: Intravascular Ultrasound/IVUS;  Surgeon: Jordan, Peter M, MD;  Location: [facility] INVASIVE CV LAB;  Service: Cardiovascular;  Laterality: N/A;   ESOPHAGOGASTRODUODENOSCOPY  08/2005   Dr. Abran: small hiatal hernia   HERNIA REPAIR  1983   KNEE ARTHROPLASTY Right 06/10/2022   Procedure: COMPUTER ASSISTED TOTAL KNEE ARTHROPLASTY;  Surgeon: Fidel Rogue, MD;  Location: [facility];  Service: Orthopedics;  Laterality: Right;  150   LEFT HEART CATH AND CORONARY ANGIOGRAPHY N/A 07/05/2019   Procedure: LEFT HEART CATH AND CORONARY ANGIOGRAPHY;  Surgeon: Jordan, Peter M, MD;  Location: [facility] INVASIVE CV LAB;  Service: Cardiovascular;  Laterality: N/A;   LEFT HEART CATH AND CORONARY ANGIOGRAPHY N/A 07/20/2019   Procedure: LEFT HEART CATH AND CORONARY ANGIOGRAPHY;  Surgeon: Dann Candyce RAMAN, MD;  Location: [facility] INVASIVE CV LAB;  Service: Cardiovascular;  Laterality: N/A;   LEFT HEART CATH AND CORONARY ANGIOGRAPHY N/A 04/24/2020   Procedure: LEFT HEART CATH AND CORONARY ANGIOGRAPHY;  Surgeon: Claudene Victory ORN, MD;  Location: [facility] INVASIVE CV LAB;  Service: Cardiovascular;  Laterality: N/A;   LEFT HEART CATH AND CORONARY ANGIOGRAPHY N/A 01/02/2021   Procedure: LEFT HEART CATH AND CORONARY ANGIOGRAPHY;  Surgeon: Verlin Lonni BIRCH, MD;  Location: [facility] INVASIVE CV LAB;  Service: Cardiovascular;  Laterality: N/A;   POLYPECTOMY  05/01/2019   Procedure: POLYPECTOMY;  Surgeon: Shaaron Lamar HERO, MD;  [facility];  Service: Endoscopy;;   POLYPECTOMY  03/07/2020   Procedure: POLYPECTOMY;  Surgeon: Shaaron Lamar HERO, MD;  [facility];  Service: Endoscopy;;   ROTATOR CUFF REPAIR Bilateral    TONSILLECTOMY  1977    Allergies  No Known Allergies  Home Medications    Prior to Admission medications   Medication Sig Start Date End Date Taking? Authorizing Provider  acetaminophen  (TYLENOL ) 500 MG tablet Take 2 tablets (1,000 mg total) by mouth every 8 (eight)  hours as needed for mild pain, headache, moderate pain or fever. 06/11/22   Leigh Valery RAMAN, PA-C  atorvastatin  (LIPITOR ) 80 MG tablet Take 1 tablet (80 mg total) by mouth every evening. 12/10/20   Tobie Suzzane POUR, MD  cetirizine (ZYRTEC) 10 MG tablet Take 10 mg by mouth daily.    [provider]  Cholecalciferol  (VITAMIN D3) 50 MCG (2000 UT) CAPS Take 2,000 Units by mouth daily.    [provider]  clopidogrel  (PLAVIX ) 75 MG tablet Take 1 tablet (75 mg total) by mouth daily. 03/31/22   Wyn Jackee VEAR Mickey., NP  [facility] VITAMIN B-12 2000 MCG TBCR Take 2,000 mcg by mouth daily.    [provider]  diazepam  (VALIUM ) 5 MG tablet Take 1 tablet (5 mg total) by mouth every 12 (twelve) hours as needed for anxiety. 04/22/21   Tobie Suzzane POUR, MD  Evolocumab  (REPATHA  SURECLICK) 140 MG/ML SOAJ Inject 140 mg into the skin every 14 (fourteen) days. 03/17/22   Hobart Powell BRAVO, MD  ezetimibe  (ZETIA ) 10 MG tablet Take 1 tablet (10 mg total) by mouth daily. 02/20/22  Hobart Powell BRAVO, MD  isosorbide  mononitrate (IMDUR ) 60 MG 24 hr tablet Take 1.5 tablets (90 mg total) by mouth daily. 01/27/22 01/22/23  Hobart Powell BRAVO, MD  meclizine  (ANTIVERT ) 25 MG tablet TAKE 1 TABLET BY MOUTH THREE TIMES DAILY AS NEEDED FOR DIZZINESS Patient taking differently: Take 25 mg by mouth 3 (three) times daily as needed for dizziness or nausea. 02/05/21   Tobie Suzzane POUR, MD  methylPREDNISolone  (MEDROL  DOSEPAK) 4 MG TBPK tablet Take 4 mg by mouth daily. 01/28/23   [provider]  nitroGLYCERIN  (NITROSTAT ) 0.4 MG SL tablet Place 1 tablet (0.4 mg total) under the tongue every 5 (five) minutes as needed for chest pain (up to 3 doses). 02/24/22 02/24/23  Hobart Powell BRAVO, MD  ondansetron  (ZOFRAN ) 4 MG tablet Take 1 tablet (4 mg total) by mouth every 8 (eight) hours as needed for nausea or vomiting. 06/11/22 06/11/23  Leigh Valery RAMAN, PA-C  pantoprazole  (PROTONIX ) 40 MG tablet Take 1 tablet (40 mg total) by mouth  2 (two) times daily. Patient taking differently: Take 40 mg by mouth daily. 12/10/20   Tobie Suzzane POUR, MD  ranolazine  (RANEXA ) 1000 MG SR tablet Take 1 tablet (1,000 mg total) by mouth 2 (two) times daily. 12/04/22   Pietro Redell RAMAN, MD    Physical Exam    Vital Signs:  Basilio LITTIE Colburn does not have vital signs available for review today.  Given telephonic nature of communication, physical exam is limited. AAOx3. NAD. Normal affect.  Speech and respirations are unlabored.  Accessory Clinical Findings    None  Assessment & Plan    1.  Preoperative Cardiovascular Risk Assessment: According to the Revised Cardiac Risk Index (RCRI), his Perioperative Risk of Major Cardiac Event is (%): 0.9. His Functional Capacity in METs is: 6.61 according to the Duke Activity Status Index (DASI). The patient is doing well from a cardiac perspective. Therefore, based on ACC/AHA guidelines, the patient would be at acceptable risk for the planned procedure without further cardiovascular testing.   The patient was advised that if he develops new symptoms prior to surgery to contact our office to arrange for a follow-up visit, and he verbalized understanding.  Per previous agreement from primary cardiologist, and pending no symptoms at the time of the appointment, patient may hold Plavix  for 5 days prior to procedure and should resume as soon as hemodynamically stable. Ideally aspirin  should be continued without interruption, however if the bleeding risk is too great, aspirin  may be held for 5-7 days prior to surgery. Please resume aspirin  post operatively when it is felt to be safe from a bleeding standpoint.   A copy of this note will be routed to requesting surgeon.  Time:   Today, I have spent 10 minutes with the patient with telehealth technology discussing medical history, symptoms, and management plan.    Rosaline EMERSON Bane, NP-C  02/01/2023, 3:21 PM [address] Office  [phone] Fax [phone]

## 2023-02-02 DIAGNOSIS — M12812 Other specific arthropathies, not elsewhere classified, left shoulder: Secondary | ICD-10-CM | POA: Diagnosis not present

## 2023-02-02 DIAGNOSIS — M12811 Other specific arthropathies, not elsewhere classified, right shoulder: Secondary | ICD-10-CM | POA: Diagnosis not present

## 2023-02-03 ENCOUNTER — Other Ambulatory Visit: Payer: Self-pay | Admitting: Neurosurgery

## 2023-02-03 DIAGNOSIS — M47812 Spondylosis without myelopathy or radiculopathy, cervical region: Secondary | ICD-10-CM

## 2023-02-04 ENCOUNTER — Encounter: Payer: Self-pay | Admitting: *Deleted

## 2023-02-04 DIAGNOSIS — Z01818 Encounter for other preprocedural examination: Secondary | ICD-10-CM | POA: Diagnosis not present

## 2023-02-10 DIAGNOSIS — Z7902 Long term (current) use of antithrombotics/antiplatelets: Secondary | ICD-10-CM | POA: Diagnosis not present

## 2023-02-10 DIAGNOSIS — Z955 Presence of coronary angioplasty implant and graft: Secondary | ICD-10-CM | POA: Diagnosis not present

## 2023-02-10 DIAGNOSIS — K219 Gastro-esophageal reflux disease without esophagitis: Secondary | ICD-10-CM | POA: Diagnosis not present

## 2023-02-10 DIAGNOSIS — K449 Diaphragmatic hernia without obstruction or gangrene: Secondary | ICD-10-CM | POA: Diagnosis not present

## 2023-02-10 DIAGNOSIS — I10 Essential (primary) hypertension: Secondary | ICD-10-CM | POA: Diagnosis not present

## 2023-02-10 DIAGNOSIS — I25119 Atherosclerotic heart disease of native coronary artery with unspecified angina pectoris: Secondary | ICD-10-CM | POA: Diagnosis not present

## 2023-02-10 DIAGNOSIS — G5602 Carpal tunnel syndrome, left upper limb: Secondary | ICD-10-CM | POA: Diagnosis not present

## 2023-02-10 DIAGNOSIS — J432 Centrilobular emphysema: Secondary | ICD-10-CM | POA: Diagnosis not present

## 2023-02-10 DIAGNOSIS — Z7982 Long term (current) use of aspirin: Secondary | ICD-10-CM | POA: Diagnosis not present

## 2023-02-10 DIAGNOSIS — J449 Chronic obstructive pulmonary disease, unspecified: Secondary | ICD-10-CM | POA: Diagnosis not present

## 2023-02-19 ENCOUNTER — Other Ambulatory Visit: Payer: Self-pay

## 2023-02-19 MED ORDER — EZETIMIBE 10 MG PO TABS: 10.0000 mg | ORAL_TABLET | Freq: Every day | ORAL | 1 refills | Status: DC

## 2023-02-22 NOTE — Progress Notes (Signed)
 HPI: FU hx of HTN, tobacco and alcohol  abuse, CAD s/p DESx2 to o-mLAD 07/05/19 for CP and +CCTA, then DES to 95% OM1 stenosis 07/20/19 for residual symptoms.  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   Repeat cath on 01/02/2021 patent LAD and OM stents, moderate D1 disease, moderate PDA disease which was overall stable. Recommended for continued medical management. Echo 3/24 showed normal LV function; mild AI; trace MR.    Since last seen, he has dyspnea with more vigorous activities.  No orthopnea, PND, pedal edema or syncope.  He has an occasional sharp pain in his chest for seconds.  He also feels tightness after activities that is unchanged.  Current Outpatient Medications  Medication Sig Dispense Refill   acetaminophen  (TYLENOL ) 500 MG tablet Take 2 tablets (1,000 mg total) by mouth every 8 (eight) hours as needed for mild pain, headache, moderate pain or fever. 30 tablet 0   atorvastatin  (LIPITOR ) 80 MG tablet Take 1 tablet (80 mg total) by mouth every evening. 90 tablet 2   cetirizine (ZYRTEC) 10 MG tablet Take 10 mg by mouth daily.     Cholecalciferol  (VITAMIN D3) 50 MCG (2000 UT) CAPS Take 2,000 Units by mouth daily.     clopidogrel  (PLAVIX ) 75 MG tablet Take 1 tablet (75 mg total) by mouth daily. 90 tablet 3   CVS VITAMIN B-12 2000 MCG TBCR Take 2,000 mcg by mouth daily.     diazepam  (VALIUM ) 5 MG tablet Take 1 tablet (5 mg total) by mouth every 12 (twelve) hours as needed for anxiety. 45 tablet 2   Evolocumab  (REPATHA  SURECLICK) 140 MG/ML SOAJ Inject 140 mg into the skin every 14 (fourteen) days. 6 mL 3   ezetimibe  (ZETIA ) 10 MG tablet Take 1 tablet (10 mg total) by mouth daily. 90 tablet 1   meclizine  (ANTIVERT ) 25 MG tablet TAKE 1 TABLET BY MOUTH THREE TIMES DAILY AS NEEDED FOR DIZZINESS (Patient taking differently: Take 25 mg by mouth 3 (three) times daily as needed for dizziness or nausea.) 90 tablet 2   ondansetron  (ZOFRAN ) 4 MG tablet  Take 1 tablet (4 mg total) by mouth every 8 (eight) hours as needed for nausea or vomiting. 30 tablet 0   pantoprazole  (PROTONIX ) 40 MG tablet Take 1 tablet (40 mg total) by mouth 2 (two) times daily. (Patient taking differently: Take 40 mg by mouth daily.) 180 tablet 2   ranolazine  (RANEXA ) 1000 MG SR tablet Take 1 tablet (1,000 mg total) by mouth 2 (two) times daily. 180 tablet 0   isosorbide  mononitrate (IMDUR ) 60 MG 24 hr tablet Take 1.5 tablets (90 mg total) by mouth daily. 135 tablet 3   nitroGLYCERIN  (NITROSTAT ) 0.4 MG SL tablet Place 1 tablet (0.4 mg total) under the tongue every 5 (five) minutes as needed for chest pain (up to 3 doses). 25 tablet 3   No current facility-administered medications for this visit.     Past Medical History:  Diagnosis Date   Alcohol  abuse    Allergy    seasonal   Anginal pain (HCC)    Anxiety    CAD (coronary artery disease)    a. 06/2019 cath - s/p successful PCI of the ostial to mid LAD with IVUS guidance and DES x 2.   COPD (chronic obstructive pulmonary disease) (HCC)    Essential hypertension    GERD (gastroesophageal reflux disease)    HOH (hard of hearing)    Hyperlipidemia  Hypertension    OA (osteoarthritis) of knee    Pulmonary nodules    a. seen on coronary CT 05/2019, will need OP f/u.   Rotator cuff tear arthropathy of both shoulders    On DISABILITY, non operative case   Shoulder pain    Tobacco abuse    Vertigo     Past Surgical History:  Procedure Laterality Date   CARDIAC CATHETERIZATION  06/2005   no significant CAD   CATARACT EXTRACTION Left    COLONOSCOPY WITH PROPOFOL  N/A 05/01/2019   Procedure: COLONOSCOPY WITH PROPOFOL ;  Surgeon: Shaaron Lamar HERO, MD;  [facility]: [facility];  Service: Endoscopy;  Laterality: N/A;  7:30am   COLONOSCOPY WITH PROPOFOL  N/A 03/07/2020   Procedure: COLONOSCOPY WITH PROPOFOL ;  Surgeon: Shaaron Lamar HERO, MD;  Location: [facility];  Service: Endoscopy;  Laterality: N/A;  8:30am    CORONARY PRESSURE/FFR STUDY N/A 07/05/2019   Procedure: INTRAVASCULAR PRESSURE WIRE/FFR STUDY;  Surgeon: Jordan, Peter M, MD;  Location: [facility] INVASIVE CV LAB;  Service: Cardiovascular;  Laterality: N/A;   CORONARY STENT INTERVENTION N/A 07/05/2019   Procedure: CORONARY STENT INTERVENTION;  Surgeon: Jordan, Peter M, MD;  Location: [facility] INVASIVE CV LAB;  Service: Cardiovascular;  Laterality: N/A;   CORONARY STENT INTERVENTION N/A 07/20/2019   Procedure: CORONARY STENT INTERVENTION;  Surgeon: Dann Candyce RAMAN, MD;  Location: [facility] INVASIVE CV LAB;  Service: Cardiovascular;  Laterality: N/A;   CORONARY ULTRASOUND/IVUS N/A 07/05/2019   Procedure: Intravascular Ultrasound/IVUS;  Surgeon: Jordan, Peter M, MD;  Location: [facility] INVASIVE CV LAB;  Service: Cardiovascular;  Laterality: N/A;   ESOPHAGOGASTRODUODENOSCOPY  08/2005   Dr. Abran: small hiatal hernia   HERNIA REPAIR  1983   KNEE ARTHROPLASTY Right 06/10/2022   Procedure: COMPUTER ASSISTED TOTAL KNEE ARTHROPLASTY;  Surgeon: Fidel Rogue, MD;  Location: [facility];  Service: Orthopedics;  Laterality: Right;  150   LEFT HEART CATH AND CORONARY ANGIOGRAPHY N/A 07/05/2019   Procedure: LEFT HEART CATH AND CORONARY ANGIOGRAPHY;  Surgeon: Jordan, Peter M, MD;  Location: [facility] INVASIVE CV LAB;  Service: Cardiovascular;  Laterality: N/A;   LEFT HEART CATH AND CORONARY ANGIOGRAPHY N/A 07/20/2019   Procedure: LEFT HEART CATH AND CORONARY ANGIOGRAPHY;  Surgeon: Dann Candyce RAMAN, MD;  Location: [facility] INVASIVE CV LAB;  Service: Cardiovascular;  Laterality: N/A;   LEFT HEART CATH AND CORONARY ANGIOGRAPHY N/A 04/24/2020   Procedure: LEFT HEART CATH AND CORONARY ANGIOGRAPHY;  Surgeon: Claudene Victory ORN, MD;  Location: [facility] INVASIVE CV LAB;  Service: Cardiovascular;  Laterality: N/A;   LEFT HEART CATH AND CORONARY ANGIOGRAPHY N/A 01/02/2021   Procedure: LEFT HEART CATH AND CORONARY ANGIOGRAPHY;  Surgeon: Verlin Lonni BIRCH, MD;  Location: [facility] INVASIVE CV LAB;  Service:  Cardiovascular;  Laterality: N/A;   POLYPECTOMY  05/01/2019   Procedure: POLYPECTOMY;  Surgeon: Shaaron Lamar HERO, MD;  Location: [facility];  Service: Endoscopy;;   POLYPECTOMY  03/07/2020   Procedure: POLYPECTOMY;  Surgeon: Shaaron Lamar HERO, MD;  Location: [facility];  Service: Endoscopy;;   ROTATOR CUFF REPAIR Bilateral    TONSILLECTOMY  1977    Social History   Socioeconomic History   Marital status: Divorced    Spouse name: Not on file   Number of children: 2   Years of education: 12   Highest education level: Not on file  Occupational History   Occupation: Self-employed office manager)  Tobacco Use   Smoking status: Former    Current packs/day: 0.00    Average packs/day: 1 pack/day for  50.3 years (50.3 ttl pk-yrs)    Types: Cigarettes    Start date: 04/15/1970    Quit date: 07/26/2020    Years since quitting: 2.6   Smokeless tobacco: Never  Vaping Use   Vaping status: Never Used  Substance and Sexual Activity   Alcohol  use: Not Currently    Comment: Drinks 3-4 beers/day    Drug use: No   Sexual activity: Yes  Other Topics Concern   Not on file  Social History Narrative   Lives at home with nephew and niece in-law   Caffeine use: Drinks coffee/soda every day   Social Drivers of Corporate Investment Banker Strain: Low Risk  (01/13/2023)   Received from [facility]   Overall Financial Resource Strain (CARDIA)    Difficulty of Paying Living Expenses: Not hard at all  Food Insecurity: No Food Insecurity (01/13/2023)   [facility] from [facility]   Hunger Vital Sign    Worried About Running Out of Food in the Last Year: Never true    Ran Out of Food in the Last Year: Never true  Transportation Needs: No Transportation Needs (01/13/2023)   Received from [facility] - Transportation    Lack of Transportation (Medical): No    Lack of Transportation (Non-Medical): No  Physical Activity: Unknown (01/13/2023)   Received from [facility]   Exercise  Vital Sign    Days of Exercise per Week: 0 days    Minutes of Exercise per Session: Not on file  Stress: No Stress Concern Present (09/02/2021)   Received from [facility] - Occupational Stress Questionnaire    Feeling of Stress : Only a little  Social Connections: Socially Isolated (01/13/2023)   Received from [facility]   Social Network    How would you rate your social network (family, work, friends)?: Little participation, lonely and socially isolated  Intimate Partner Violence: Not At Risk (01/13/2023)   Received from [facility]   HITS    Over the last 12 months how often did your partner physically hurt you?: Never    Over the last 12 months how often did your partner insult you or talk down to you?: Never    Over the last 12 months how often did your partner threaten you with physical harm?: Never    Over the last 12 months how often did your partner scream or curse at you?: Never    Family History  Problem Relation Age of Onset   Heart disease Mother    Diabetes Mother    Hypertension Mother    Heart disease Father    Hypertension Father    Stroke Other    Cancer Other    Cancer Brother        prostate   Colon cancer Neg Hx     ROS: no fevers or chills, productive cough, hemoptysis, dysphasia, odynophagia, melena, hematochezia, dysuria, hematuria, rash, seizure activity, orthopnea, PND, pedal edema, claudication. Remaining systems are negative.  Physical Exam: Well-developed well-nourished in no acute distress.  Skin is warm and dry.  HEENT is normal.  Neck is supple.  Chest is clear to auscultation with normal expansion.  Cardiovascular exam is regular rate and rhythm.  Abdominal exam nontender or distended. No masses palpated.  Positive bruit Extremities show no edema. neuro grossly intact  EKG Interpretation Date/Time:  Tuesday March 02 2023 08:14:39 EST Ventricular Rate:  65 PR  Interval:  142  QRS Duration:  92 QT Interval:  422 QTC Calculation: 438 R Axis:   56  Text Interpretation: Normal sinus rhythm Normal ECG Confirmed by Pietro Rogue ([id number]) on 03/02/2023 8:18:08 AM    A/P  1 coronary artery disease with history of PCI of the LAD and OM-patient has occasional chest heaviness after activities.  This is unchanged.  Most recent catheterization revealed moderate coronary artery disease.  Plan to continue medical therapy with aspirin  and statin.  Patient can discontinue Plavix .  He will continue on isosorbide  and Ranexa .  2 hypertension-patient's blood pressure is controlled.  Continue present medical regimen.  3 hyperlipidemia-continue Lipitor , Repatha  and Zetia .  Check lipids.  Recent liver functions are normal.  4 tobacco abuse-patient has discontinued previously.  5 bruit-schedule abdominal ultrasound to exclude aneurysm.  Rogue Pietro, MD

## 2023-02-23 ENCOUNTER — Ambulatory Visit
Admission: RE | Admit: 2023-02-23 | Discharge: 2023-02-23 | Disposition: A | Discharge status: Home / Self Care | Payer: 59 | Source: Ambulatory Visit | Attending: Neurosurgery | Admitting: Neurosurgery

## 2023-02-23 DIAGNOSIS — M4312 Spondylolisthesis, cervical region: Secondary | ICD-10-CM | POA: Diagnosis not present

## 2023-02-23 DIAGNOSIS — M47812 Spondylosis without myelopathy or radiculopathy, cervical region: Secondary | ICD-10-CM

## 2023-02-25 DIAGNOSIS — M79671 Pain in right foot: Secondary | ICD-10-CM | POA: Diagnosis not present

## 2023-03-02 ENCOUNTER — Encounter: Payer: Self-pay | Admitting: Cardiology

## 2023-03-02 ENCOUNTER — Ambulatory Visit: Payer: 59 | Attending: Cardiology | Admitting: Cardiology

## 2023-03-02 VITALS — BP 120/70 | HR 65 | Ht 66.0 in | Wt 160.5 lb

## 2023-03-02 DIAGNOSIS — I25118 Atherosclerotic heart disease of native coronary artery with other forms of angina pectoris: Secondary | ICD-10-CM

## 2023-03-02 DIAGNOSIS — I2510 Atherosclerotic heart disease of native coronary artery without angina pectoris: Secondary | ICD-10-CM | POA: Diagnosis not present

## 2023-03-02 DIAGNOSIS — E782 Mixed hyperlipidemia: Secondary | ICD-10-CM | POA: Diagnosis not present

## 2023-03-02 DIAGNOSIS — R0989 Other specified symptoms and signs involving the circulatory and respiratory systems: Secondary | ICD-10-CM | POA: Diagnosis not present

## 2023-03-02 LAB — LIPID PANEL
Chol/HDL Ratio: 1.4 {ratio} (ref 0.0–5.0)
Cholesterol, Total: 72 mg/dL — ABNORMAL LOW (ref 100–199)
HDL: 51 mg/dL (ref >39)
LDL Chol Calc (NIH): 6 mg/dL (ref 0–99)
Triglycerides: 69 mg/dL (ref 0–149)
VLDL Cholesterol Cal: 15 mg/dL (ref 5–40)

## 2023-03-02 MED ORDER — EZETIMIBE 10 MG PO TABS: 10.0000 mg | ORAL_TABLET | Freq: Every day | ORAL | 3 refills | Status: AC

## 2023-03-02 MED ORDER — ATORVASTATIN CALCIUM 80 MG PO TABS: 80.0000 mg | ORAL_TABLET | Freq: Every evening | ORAL | 3 refills | Status: AC

## 2023-03-02 MED ORDER — RANOLAZINE ER 1000 MG PO TB12: 1000.0000 mg | ORAL_TABLET | Freq: Two times a day (BID) | ORAL | 3 refills | Status: DC

## 2023-03-02 MED ORDER — ISOSORBIDE MONONITRATE ER 60 MG PO TB24: 90.0000 mg | ORAL_TABLET | Freq: Every day | ORAL | 3 refills | Status: AC

## 2023-03-02 MED ORDER — REPATHA SURECLICK 140 MG/ML ~~LOC~~ SOAJ: 140.0000 mg | SUBCUTANEOUS | 3 refills | Status: DC

## 2023-03-02 NOTE — Patient Instructions (Signed)
 Medication Instructions:   STOP PLAVIX   *If you need a refill on your cardiac medications before your next appointment, please call your pharmacy*   Lab Work:  Your physician recommends that you HAVE LAB WORK TODAY  If you have labs (blood work) drawn today and your tests are completely normal, you will receive your results only by: MyChart Message (if you have MyChart) OR A paper copy in the mail If you have any lab test that is abnormal or we need to change your treatment, we will call you to review the results.   Testing/Procedures:  Your physician has requested that you have an abdominal aorta duplex. During this test, an ultrasound is used to evaluate the aorta. Allow 30 minutes for this exam. Do not eat after midnight the day before and avoid carbonated beverages.  Please note: We ask at that you not bring children with you during ultrasound (echo/ vascular) testing. Due to room size and safety concerns, children are not allowed in the ultrasound rooms during exams. Our front office staff cannot provide observation of children in our lobby area while testing is being conducted. An adult accompanying a patient to their appointment will only be allowed in the ultrasound room at the discretion of the ultrasound technician under special circumstances. We apologize for any inconvenience. [facility]   Follow-Up: At [facility], you and your health needs are our priority.  As part of our continuing mission to provide you with exceptional heart care, we have created designated Provider Care Teams.  These Care Teams include your primary Cardiologist (physician) and Advanced Practice Providers (APPs -  Physician Assistants and Nurse Practitioners) who all work together to provide you with the care you need, when you need it.  We recommend signing up for the patient portal called MyChart.  Sign up information is provided on this After Visit Summary.  MyChart is used to connect  with patients for Virtual Visits (Telemedicine).  Patients are able to view lab/test results, encounter notes, upcoming appointments, etc.  Non-urgent messages can be sent to your provider as well.   To learn more about what you can do with MyChart, go to [email or web address].    Your next appointment:   6 month(s)  Provider:   Redell Shallow, MD

## 2023-03-03 ENCOUNTER — Encounter: Payer: Self-pay | Admitting: *Deleted

## 2023-03-08 ENCOUNTER — Encounter: Payer: Self-pay | Admitting: *Deleted

## 2023-03-09 DIAGNOSIS — H9190 Unspecified hearing loss, unspecified ear: Secondary | ICD-10-CM | POA: Diagnosis not present

## 2023-03-09 DIAGNOSIS — M25561 Pain in right knee: Secondary | ICD-10-CM | POA: Diagnosis not present

## 2023-03-09 DIAGNOSIS — H547 Unspecified visual loss: Secondary | ICD-10-CM | POA: Diagnosis not present

## 2023-03-09 DIAGNOSIS — G8929 Other chronic pain: Secondary | ICD-10-CM | POA: Diagnosis not present

## 2023-03-09 DIAGNOSIS — Z7382 Dual sensory impairment: Secondary | ICD-10-CM | POA: Diagnosis not present

## 2023-03-16 DIAGNOSIS — M47892 Other spondylosis, cervical region: Secondary | ICD-10-CM | POA: Diagnosis not present

## 2023-03-16 DIAGNOSIS — M4722 Other spondylosis with radiculopathy, cervical region: Secondary | ICD-10-CM | POA: Diagnosis not present

## 2023-03-25 NOTE — Pre-Procedure Instructions (Signed)
 Surgical Instructions   Your procedure is scheduled on Wednesday, March 5th. Report to [facility] Main Entrance "A" at 06:30 A.M., then check in with the Admitting office. Any questions or running late day of surgery: call [phone]  Questions prior to your surgery date: call [phone], Monday-Friday, 8am-4pm. If you experience any cold or flu symptoms such as cough, fever, chills, shortness of breath, etc. between now and your scheduled surgery, please notify us at the above number.     Remember:  Do not eat after midnight the night before your surgery  You may drink clear liquids until 05:30 AM the morning of your surgery.   Clear liquids allowed are: Water, Non-Citrus Juices (without pulp), Carbonated Beverages, Clear Tea (no milk, honey, etc.), Black Coffee Only (NO MILK, CREAM OR POWDERED CREAMER of any kind), and Gatorade.    Take these medicines the morning of surgery with A SIP OF WATER  cetirizine (ZYRTEC)  ezetimibe (ZETIA)  isosorbide mononitrate (IMDUR)  pantoprazole (PROTONIX)  ranolazine (RANEXA)    May take these medicines IF NEEDED: acetaminophen (TYLENOL)  diazepam (VALIUM)  meclizine (ANTIVERT)  methocarbamol (ROBAXIN)  nitroGLYCERIN (NITROSTAT)- If you have to take this medication prior to surgery, please call [phone] and report this to a nurse    One week prior to surgery, STOP taking any Aspirin (unless otherwise instructed by your surgeon) Aleve, Naproxen, Ibuprofen, Motrin, Advil, Goody's, BC's, all herbal medications, fish oil, and non-prescription vitamins.                     Do NOT Smoke (Tobacco/Vaping) for 24 hours prior to your procedure.  If you use a CPAP at night, you may bring your mask/headgear for your overnight stay.   You will be asked to remove any contacts, glasses, piercing's, hearing aid's, dentures/partials prior to surgery. Please bring cases for these items if needed.    Patients discharged the day of surgery will not  be allowed to drive home, and someone needs to stay with them for 24 hours.  SURGICAL WAITING ROOM VISITATION Patients may have no more than 2 support people in the waiting area - these visitors may rotate.   Pre-op nurse will coordinate an appropriate time for 1 ADULT support person, who may not rotate, to accompany patient in pre-op.  Children under the age of 25 must have an adult with them who is not the patient and must remain in the main waiting area with an adult.  If the patient needs to stay at the hospital during part of their recovery, the visitor guidelines for inpatient rooms apply.  Please refer to the [facility] website for the visitor guidelines for any additional information.   If you received a COVID test during your pre-op visit  it is requested that you wear a mask when out in public, stay away from anyone that may not be feeling well and notify your surgeon if you develop symptoms. If you have been in contact with anyone that has tested positive in the last 10 days please notify you surgeon.      Pre-operative 5 CHG Bathing Instructions   You can play a key role in reducing the risk of infection after surgery. Your skin needs to be as free of germs as possible. You can reduce the number of germs on your skin by washing with CHG (chlorhexidine gluconate) soap before surgery. CHG is an antiseptic soap that kills germs and continues to kill germs even after washing.  DO NOT use if you have an allergy to chlorhexidine/CHG or antibacterial soaps. If your skin becomes reddened or irritated, stop using the CHG and notify one of our RNs at [phone].   Please shower with the CHG soap starting 4 days before surgery using the following schedule:     Please keep in mind the following:  DO NOT shave, including legs and underarms, starting the day of your first shower.   You may shave your face at any point before/day of surgery.  Place clean sheets on your bed the day you  start using CHG soap. Use a clean washcloth (not used since being washed) for each shower. DO NOT sleep with pets once you start using the CHG.   CHG Shower Instructions:  Wash your face and private area with normal soap. If you choose to wash your hair, wash first with your normal shampoo.  After you use shampoo/soap, rinse your hair and body thoroughly to remove shampoo/soap residue.  Turn the water OFF and apply about 3 tablespoons (45 ml) of CHG soap to a CLEAN washcloth.  Apply CHG soap ONLY FROM YOUR NECK DOWN TO YOUR TOES (washing for 3-5 minutes)  DO NOT use CHG soap on face, private areas, open wounds, or sores.  Pay special attention to the area where your surgery is being performed.  If you are having back surgery, having someone wash your back for you may be helpful. Wait 2 minutes after CHG soap is applied, then you may rinse off the CHG soap.  Pat dry with a clean towel  Put on clean clothes/pajamas   If you choose to wear lotion, please use ONLY the CHG-compatible lotions that are listed below.  Additional instructions for the day of surgery: DO NOT APPLY any lotions, deodorants, cologne, or perfumes.   Do not bring valuables to the hospital. [facility] is not responsible for any belongings/valuables. Do not wear nail polish, gel polish, artificial nails, or any other type of covering on natural nails (fingers and toes) Do not wear jewelry or makeup Put on clean/comfortable clothes.  Please brush your teeth.  Ask your nurse before applying any prescription medications to the skin.     CHG Compatible Lotions   Aveeno Moisturizing lotion  Cetaphil Moisturizing Cream  Cetaphil Moisturizing Lotion  Clairol Herbal Essence Moisturizing Lotion, Dry Skin  Clairol Herbal Essence Moisturizing Lotion, Extra Dry Skin  Clairol Herbal Essence Moisturizing Lotion, Normal Skin  Curel Age Defying Therapeutic Moisturizing Lotion with Alpha Hydroxy  Curel Extreme Care Body Lotion   Curel Soothing Hands Moisturizing Hand Lotion  Curel Therapeutic Moisturizing Cream, Fragrance-Free  Curel Therapeutic Moisturizing Lotion, Fragrance-Free  Curel Therapeutic Moisturizing Lotion, Original Formula  Eucerin Daily Replenishing Lotion  Eucerin Dry Skin Therapy Plus Alpha Hydroxy Crme  Eucerin Dry Skin Therapy Plus Alpha Hydroxy Lotion  Eucerin Original Crme  Eucerin Original Lotion  Eucerin Plus Crme Eucerin Plus Lotion  Eucerin TriLipid Replenishing Lotion  Keri Anti-Bacterial Hand Lotion  Keri Deep Conditioning Original Lotion Dry Skin Formula Softly Scented  Keri Deep Conditioning Original Lotion, Fragrance Free Sensitive Skin Formula  Keri Lotion Fast Absorbing Fragrance Free Sensitive Skin Formula  Keri Lotion Fast Absorbing Softly Scented Dry Skin Formula  Keri Original Lotion  Keri Skin Renewal Lotion Keri Silky Smooth Lotion  Keri Silky Smooth Sensitive Skin Lotion  Nivea Body Creamy Conditioning Oil  Nivea Body Extra Enriched Teacher, adult education Moisturizing Lotion Nivea Crme  Nivea  Skin Firming Lotion  NutraDerm 30 Skin Lotion  NutraDerm Skin Lotion  NutraDerm Therapeutic Skin Cream  NutraDerm Therapeutic Skin Lotion  ProShield Protective Hand Cream  Provon moisturizing lotion  Please read over the following fact sheets that you were given.

## 2023-03-26 ENCOUNTER — Encounter (HOSPITAL_COMMUNITY): Payer: Self-pay

## 2023-03-26 ENCOUNTER — Other Ambulatory Visit: Payer: Self-pay | Admitting: Neurosurgery

## 2023-03-26 ENCOUNTER — Encounter (HOSPITAL_COMMUNITY)
Admission: RE | Admit: 2023-03-26 | Discharge: 2023-03-26 | Disposition: A | Discharge status: Home / Self Care | Payer: 59 | Source: Ambulatory Visit | Attending: Cardiology | Admitting: Cardiology

## 2023-03-26 ENCOUNTER — Ambulatory Visit (HOSPITAL_COMMUNITY)
Admission: RE | Admit: 2023-03-26 | Discharge: 2023-03-26 | Disposition: A | Discharge status: Home / Self Care | Payer: 59 | Source: Ambulatory Visit | Attending: Cardiology | Admitting: Cardiology

## 2023-03-26 ENCOUNTER — Other Ambulatory Visit: Payer: Self-pay

## 2023-03-26 VITALS — BP 120/74 | HR 87 | Temp 97.7°F | Resp 17 | Ht 67.0 in | Wt 146.7 lb

## 2023-03-26 DIAGNOSIS — Z955 Presence of coronary angioplasty implant and graft: Secondary | ICD-10-CM | POA: Insufficient documentation

## 2023-03-26 DIAGNOSIS — E785 Hyperlipidemia, unspecified: Secondary | ICD-10-CM | POA: Diagnosis not present

## 2023-03-26 DIAGNOSIS — K219 Gastro-esophageal reflux disease without esophagitis: Secondary | ICD-10-CM | POA: Diagnosis not present

## 2023-03-26 DIAGNOSIS — R0989 Other specified symptoms and signs involving the circulatory and respiratory systems: Secondary | ICD-10-CM | POA: Diagnosis not present

## 2023-03-26 DIAGNOSIS — F1010 Alcohol abuse, uncomplicated: Secondary | ICD-10-CM | POA: Diagnosis present

## 2023-03-26 DIAGNOSIS — I10 Essential (primary) hypertension: Secondary | ICD-10-CM | POA: Insufficient documentation

## 2023-03-26 DIAGNOSIS — Z01818 Encounter for other preprocedural examination: Secondary | ICD-10-CM

## 2023-03-26 DIAGNOSIS — F1011 Alcohol abuse, in remission: Secondary | ICD-10-CM | POA: Insufficient documentation

## 2023-03-26 DIAGNOSIS — I2510 Atherosclerotic heart disease of native coronary artery without angina pectoris: Secondary | ICD-10-CM | POA: Insufficient documentation

## 2023-03-26 DIAGNOSIS — Z136 Encounter for screening for cardiovascular disorders: Secondary | ICD-10-CM | POA: Diagnosis not present

## 2023-03-26 DIAGNOSIS — Z87891 Personal history of nicotine dependence: Secondary | ICD-10-CM | POA: Insufficient documentation

## 2023-03-26 LAB — CBC
HCT: 40.6 % (ref 39.0–52.0)
Hemoglobin: 13.5 g/dL (ref 13.0–17.0)
MCH: 32.8 pg (ref 26.0–34.0)
MCHC: 33.3 g/dL (ref 30.0–36.0)
MCV: 98.5 fL (ref 80.0–100.0)
Platelets: 186 10*3/uL (ref 150–400)
RBC: 4.12 MIL/uL — ABNORMAL LOW (ref 4.22–5.81)
RDW: 13.3 % (ref 11.5–15.5)
WBC: 5.7 10*3/uL (ref 4.0–10.5)
nRBC: 0 % (ref 0.0–0.2)

## 2023-03-26 LAB — COMPREHENSIVE METABOLIC PANEL
ALT: 13 U/L (ref 0–44)
AST: 14 U/L — ABNORMAL LOW (ref 15–41)
Albumin: 3.7 g/dL (ref 3.5–5.0)
Alkaline Phosphatase: 47 U/L (ref 38–126)
Anion gap: 9 (ref 5–15)
BUN: <5 mg/dL — ABNORMAL LOW (ref 8–23)
CO2: 25 mmol/L (ref 22–32)
Calcium: 8.9 mg/dL (ref 8.9–10.3)
Chloride: 105 mmol/L (ref 98–111)
Creatinine, Ser: 0.75 mg/dL (ref 0.61–1.24)
GFR, Estimated: >60 mL/min (ref >60)
Glucose, Bld: 94 mg/dL (ref 70–99)
Potassium: 4.2 mmol/L (ref 3.5–5.1)
Sodium: 139 mmol/L (ref 135–145)
Total Bilirubin: 1 mg/dL (ref 0.0–1.2)
Total Protein: 6.2 g/dL — ABNORMAL LOW (ref 6.5–8.1)

## 2023-03-26 LAB — TYPE AND SCREEN
ABO/RH(D): A POS
Antibody Screen: NEGATIVE

## 2023-03-26 LAB — SURGICAL PCR SCREEN
MRSA, PCR: NEGATIVE
Staphylococcus aureus: NEGATIVE

## 2023-03-26 NOTE — Progress Notes (Signed)
 PCP - Elder Negus, NP Cardiologist - Dr. Olga Millers  PPM/ICD - denies   Chest x-ray - 03/19/22 EKG - 03/02/23 Stress Test - denies ECHO - 03/11/22 Cardiac Cath - 01/02/21  Sleep Study - denies   DM- denies  Last dose of GLP1 agonist-  n/a   Blood Thinner Instructions: n/a Aspirin Instructions: Hold 1 week. Last dose 2/28  ERAS Protcol - clears until 0530   COVID TEST- n/a   Anesthesia review: yes, cardiac hx  Patient denies shortness of breath, fever, cough and chest pain at PAT appointment   All instructions explained to the patient, with a verbal understanding of the material. Patient agrees to go over the instructions while at home for a better understanding. The opportunity to ask questions was provided.

## 2023-03-29 ENCOUNTER — Encounter: Payer: Self-pay | Admitting: *Deleted

## 2023-03-29 NOTE — Anesthesia Preprocedure Evaluation (Signed)
 Anesthesia Evaluation  Patient identified by MRN, date of birth, ID band Patient awake    Reviewed: Allergy & Precautions, NPO status , Patient's Chart, lab work & pertinent test results  Airway Mallampati: I  TM Distance: >3 FB Neck ROM: Full    Dental  (+) Edentulous Upper, Edentulous Lower   Pulmonary COPD, former smoker   breath sounds clear to auscultation       Cardiovascular hypertension, Pt. on medications (-) angina + CAD and + Cardiac Stents   Rhythm:Regular Rate:Normal  '24 ECHO: EF 55 to 60%.  1. The LV has normal function, no regional wall motion abnormalities.   2. RVF is normal. The right ventricular size is normal.   3. The mitral valve is grossly normal. Trivial mitral valve regurgitation.   4. The aortic valve is tricuspid. Aortic valve regurgitation is mild. Aortic regurgitation PHT measures 703 msec.     Neuro/Psych   Anxiety     vertigo    GI/Hepatic Neg liver ROS,GERD  Medicated and Controlled,,  Endo/Other  negative endocrine ROS    Renal/GU negative Renal ROS     Musculoskeletal   Abdominal   Peds  Hematology Hb 13.5, plt 186k   Anesthesia Other Findings   Reproductive/Obstetrics                             Anesthesia Physical Anesthesia Plan  ASA: 3  Anesthesia Plan: General   Post-op Pain Management: Tylenol PO (pre-op)*   Induction: Intravenous  PONV Risk Score and Plan: 2 and Ondansetron and Dexamethasone  Airway Management Planned: Oral ETT and Video Laryngoscope Planned  Additional Equipment: None  Intra-op Plan:   Post-operative Plan: Extubation in OR  Informed Consent: I have reviewed the patients History and Physical, chart, labs and discussed the procedure including the risks, benefits and alternatives for the proposed anesthesia with the patient or authorized representative who has indicated his/her understanding and acceptance.      Dental advisory given  Plan Discussed with: CRNA and Surgeon  Anesthesia Plan Comments: (PAT note by Antionette Poles, PA-C: 68 year old male follows with cardiology for history of HTN, HLD, CAD s/p DES x 2 to LAD 07/05/19 for CP and+CCTA,then DES to 95% OM1 stenosis 07/20/19 for residual symptoms.  Most recent cath 12/2020 revealed moderate disease, no intervention taken at that time.  Echo 2/24 showed EF 55 to 60%, normal RV function, mild aortic regurgitation.  Last seen by Dr. Jens Som 03/02/2023, stable from cardiac standpoint.  Plavix was discontinued at that time.  He was continued on isosorbide and Ranexa for management of chronic stable angina.  Blood pressure well-controlled, no changes to medical regimen.  Ultrasound was ordered to evaluate abdominal bruit; test done 03/18/2023 showed no AAA.  Other pertinent history includes GERD, current smoker, prior EtOH abuse.  Cervical MRI 02/15/2023 with evidence of multilevel chronic compressive myelopathy.  Preop labs reviewed, unremarkable.  EKG 03/02/2023: NSR.  Rate 65.  TTE 03/11/2022: 1. Left ventricular ejection fraction, by estimation, is 55 to 60%. The  left ventricle has normal function. The left ventricle has no regional  wall motion abnormalities. Left ventricular diastolic parameters are  indeterminate.  2. Right ventricular systolic function is normal. The right ventricular  size is normal. Tricuspid regurgitation signal is inadequate for assessing  PA pressure.  3. The mitral valve is grossly normal. Trivial mitral valve  regurgitation.  4. The aortic valve is tricuspid. Aortic valve regurgitation is  mild.  Aortic regurgitation PHT measures 703 msec.  5. The inferior vena cava is normal in size with greater than 50%  respiratory variability, suggesting right atrial pressure of 3 mmHg.   Comparison(s): No significant change from prior study. Prior images  reviewed side by side.   Cath 01/02/2021:   2nd Diag lesion is  60% stenosed.   RPDA lesion is 60% stenosed.   RV Branch lesion is 10% stenosed.   1st Mrg lesion is 10% stenosed.   Mid LAD lesion is 30% stenosed.   The left ventricular systolic function is normal.   LV end diastolic pressure is normal.   The left ventricular ejection fraction is 55-65% by visual estimate.   There is no mitral valve regurgitation.  1. Patent mid LAD stents without significant restenosis. Moderate ostial Diagonal branch stenosis, jailed by LAD stent with normal flow.  2. Patent obtuse marginal branch stent without restenosis. This vessel arises from what may be a large intermediate branch. There is a small anomalous branch that arises from the RCA ostium that may represent a small Circumflex branch 3. Large dominant RCA with a high take off of the PDA. The proximal segment of the PDA has a 50-60% stenosis that is unchanged from prior cardiac caths.  4. Normal LV systolic function.  5. Mild elevation LV filling pressures, LVEDP 17 mmHg.   Recommendations: Continue medical management of CAD. I suspect that his dyspnea is secondary to deconditioning but cannot exclude microvascular disease.    )        Anesthesia Quick Evaluation

## 2023-03-29 NOTE — Progress Notes (Signed)
 Anesthesia Chart Review:  68 year old male follows with cardiology for history of HTN, HLD, CAD s/p DES x 2 to LAD 07/05/19 for CP and +CCTA, then DES to 95% OM1 stenosis 07/20/19 for residual symptoms.  Most recent cath 12/2020 revealed moderate disease, no intervention taken at that time.  Echo 2/24 showed EF 55 to 60%, normal RV function, mild aortic regurgitation.  Last seen by Dr. Jens Som 03/02/2023, stable from cardiac standpoint.  Plavix was discontinued at that time.  He was continued on isosorbide and Ranexa for management of chronic stable angina.  Blood pressure well-controlled, no changes to medical regimen.  Ultrasound was ordered to evaluate abdominal bruit; test done 03/18/2023 showed no AAA.  Other pertinent history includes GERD, current smoker, prior EtOH abuse.  Cervical MRI 02/15/2023 with evidence of multilevel chronic compressive myelopathy.  Preop labs reviewed, unremarkable.  EKG 03/02/2023: NSR.  Rate 65.  TTE 03/11/2022:  1. Left ventricular ejection fraction, by estimation, is 55 to 60%. The  left ventricle has normal function. The left ventricle has no regional  wall motion abnormalities. Left ventricular diastolic parameters are  indeterminate.   2. Right ventricular systolic function is normal. The right ventricular  size is normal. Tricuspid regurgitation signal is inadequate for assessing  PA pressure.   3. The mitral valve is grossly normal. Trivial mitral valve  regurgitation.   4. The aortic valve is tricuspid. Aortic valve regurgitation is mild.  Aortic regurgitation PHT measures 703 msec.   5. The inferior vena cava is normal in size with greater than 50%  respiratory variability, suggesting right atrial pressure of 3 mmHg.   Comparison(s): No significant change from prior study. Prior images  reviewed side by side.   Cath 01/02/2021:   2nd Diag lesion is 60% stenosed.   RPDA lesion is 60% stenosed.   RV Branch lesion is 10% stenosed.   1st Mrg lesion is  10% stenosed.   Mid LAD lesion is 30% stenosed.   The left ventricular systolic function is normal.   LV end diastolic pressure is normal.   The left ventricular ejection fraction is 55-65% by visual estimate.   There is no mitral valve regurgitation.   Patent mid LAD stents without significant restenosis. Moderate ostial Diagonal branch stenosis, jailed by LAD stent with normal flow.  Patent obtuse marginal branch stent without restenosis. This vessel arises from what may be a large intermediate branch. 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 The proximal segment of the PDA has a 50-60% stenosis that is unchanged from prior cardiac caths.  Normal LV systolic function.  Mild elevation LV filling pressures, LVEDP 17 mmHg.    Recommendations: Continue medical management of CAD. I suspect that his dyspnea is secondary to deconditioning but cannot exclude microvascular disease.     Zannie Cove [facility] Short Stay Center/Anesthesiology Phone [phone] 03/29/2023 10:53 AM

## 2023-03-31 ENCOUNTER — Encounter (HOSPITAL_COMMUNITY): Payer: Self-pay | Admitting: Neurosurgery

## 2023-03-31 ENCOUNTER — Inpatient Hospital Stay (HOSPITAL_COMMUNITY): Admitting: Physician Assistant

## 2023-03-31 ENCOUNTER — Other Ambulatory Visit: Payer: Self-pay

## 2023-03-31 ENCOUNTER — Inpatient Hospital Stay (HOSPITAL_COMMUNITY)

## 2023-03-31 ENCOUNTER — Inpatient Hospital Stay (HOSPITAL_COMMUNITY): Admitting: Anesthesiology

## 2023-03-31 ENCOUNTER — Inpatient Hospital Stay (HOSPITAL_COMMUNITY)
Admission: RE | Admit: 2023-03-31 | Discharge: 2023-04-01 | DRG: 472 | Disposition: A | Discharge status: Home / Self Care | Payer: 59 | Attending: Neurosurgery | Admitting: Neurosurgery

## 2023-03-31 ENCOUNTER — Encounter (HOSPITAL_COMMUNITY)
Admission: RE | Disposition: A | Discharge status: Home / Self Care | Payer: Self-pay | Source: Home / Self Care | Attending: Neurosurgery

## 2023-03-31 DIAGNOSIS — E785 Hyperlipidemia, unspecified: Secondary | ICD-10-CM | POA: Diagnosis present

## 2023-03-31 DIAGNOSIS — F419 Anxiety disorder, unspecified: Secondary | ICD-10-CM | POA: Diagnosis present

## 2023-03-31 DIAGNOSIS — H9190 Unspecified hearing loss, unspecified ear: Secondary | ICD-10-CM | POA: Diagnosis not present

## 2023-03-31 DIAGNOSIS — Z96651 Presence of right artificial knee joint: Secondary | ICD-10-CM | POA: Diagnosis present

## 2023-03-31 DIAGNOSIS — Z87891 Personal history of nicotine dependence: Secondary | ICD-10-CM

## 2023-03-31 DIAGNOSIS — G9589 Other specified diseases of spinal cord: Secondary | ICD-10-CM | POA: Diagnosis present

## 2023-03-31 DIAGNOSIS — J449 Chronic obstructive pulmonary disease, unspecified: Secondary | ICD-10-CM | POA: Diagnosis not present

## 2023-03-31 DIAGNOSIS — I2510 Atherosclerotic heart disease of native coronary artery without angina pectoris: Secondary | ICD-10-CM | POA: Diagnosis present

## 2023-03-31 DIAGNOSIS — M47812 Spondylosis without myelopathy or radiculopathy, cervical region: Secondary | ICD-10-CM

## 2023-03-31 DIAGNOSIS — M4692 Unspecified inflammatory spondylopathy, cervical region: Secondary | ICD-10-CM | POA: Diagnosis not present

## 2023-03-31 DIAGNOSIS — Z955 Presence of coronary angioplasty implant and graft: Secondary | ICD-10-CM | POA: Diagnosis not present

## 2023-03-31 DIAGNOSIS — M40202 Unspecified kyphosis, cervical region: Secondary | ICD-10-CM | POA: Diagnosis not present

## 2023-03-31 DIAGNOSIS — Z79899 Other long term (current) drug therapy: Secondary | ICD-10-CM | POA: Diagnosis not present

## 2023-03-31 DIAGNOSIS — I10 Essential (primary) hypertension: Secondary | ICD-10-CM

## 2023-03-31 DIAGNOSIS — K219 Gastro-esophageal reflux disease without esophagitis: Secondary | ICD-10-CM | POA: Diagnosis present

## 2023-03-31 DIAGNOSIS — Z0189 Encounter for other specified special examinations: Secondary | ICD-10-CM | POA: Diagnosis not present

## 2023-03-31 DIAGNOSIS — Z7982 Long term (current) use of aspirin: Secondary | ICD-10-CM | POA: Diagnosis not present

## 2023-03-31 DIAGNOSIS — M50122 Cervical disc disorder at C5-C6 level with radiculopathy: Secondary | ICD-10-CM | POA: Diagnosis not present

## 2023-03-31 DIAGNOSIS — Z981 Arthrodesis status: Secondary | ICD-10-CM | POA: Diagnosis not present

## 2023-03-31 DIAGNOSIS — M4722 Other spondylosis with radiculopathy, cervical region: Principal | ICD-10-CM | POA: Diagnosis present

## 2023-03-31 LAB — POCT I-STAT EG7
Acid-base deficit: 5 mmol/L — ABNORMAL HIGH (ref 0.0–2.0)
Bicarbonate: 21.8 mmol/L (ref 20.0–28.0)
Calcium, Ion: 1.23 mmol/L (ref 1.15–1.40)
HCT: 28 % — ABNORMAL LOW (ref 39.0–52.0)
Hemoglobin: 9.5 g/dL — ABNORMAL LOW (ref 13.0–17.0)
O2 Saturation: 91 %
Potassium: 3.8 mmol/L (ref 3.5–5.1)
Sodium: 139 mmol/L (ref 135–145)
TCO2: 23 mmol/L (ref 22–32)
pCO2, Ven: 45.9 mmHg (ref 44–60)
pH, Ven: 7.285 (ref 7.25–7.43)
pO2, Ven: 68 mmHg — ABNORMAL HIGH (ref 32–45)

## 2023-03-31 LAB — ABO/RH: ABO/RH(D): A POS

## 2023-03-31 SURGERY — ANTERIOR CERVICAL DECOMPRESSION/DISCECTOMY FUSION 3 LEVELS
Anesthesia: General | Site: Spine Cervical

## 2023-03-31 MED ORDER — PROPOFOL 10 MG/ML IV BOLUS
INTRAVENOUS | Status: DC | PRN
  Administered 2023-03-31: 100 mg via INTRAVENOUS

## 2023-03-31 MED ORDER — PHENYLEPHRINE HCL-NACL 20-0.9 MG/250ML-% IV SOLN
INTRAVENOUS | Status: DC | PRN
  Administered 2023-03-31: 30 ug/min via INTRAVENOUS

## 2023-03-31 MED ORDER — ALBUMIN HUMAN 5 % IV SOLN
INTRAVENOUS | Status: DC | PRN
Start: 2023-03-31 — End: 2023-03-31

## 2023-03-31 MED ORDER — ONDANSETRON HCL 4 MG PO TABS: 4.0000 mg | ORAL_TABLET | Freq: Four times a day (QID) | ORAL | Status: DC | PRN

## 2023-03-31 MED ORDER — THROMBIN 5000 UNITS EX KIT
PACK | CUTANEOUS | Status: AC
  Filled 2023-03-31: qty 1

## 2023-03-31 MED ORDER — CALCIUM CHLORIDE 10 % IV SOLN
INTRAVENOUS | Status: AC
  Filled 2023-03-31: qty 10

## 2023-03-31 MED ORDER — ROCURONIUM BROMIDE 10 MG/ML (PF) SYRINGE
PREFILLED_SYRINGE | INTRAVENOUS | Status: AC
  Filled 2023-03-31: qty 10

## 2023-03-31 MED ORDER — EVOLOCUMAB 140 MG/ML ~~LOC~~ SOAJ: 140.0000 mg | SUBCUTANEOUS | Status: DC

## 2023-03-31 MED ORDER — THROMBIN 20000 UNITS EX SOLR: CUTANEOUS | Status: DC | PRN

## 2023-03-31 MED ORDER — NITROGLYCERIN 0.4 MG SL SUBL: 0.4000 mg | SUBLINGUAL_TABLET | SUBLINGUAL | Status: DC | PRN

## 2023-03-31 MED ORDER — HYDROMORPHONE HCL 1 MG/ML IJ SOLN
0.2500 mg | INTRAMUSCULAR | Status: DC | PRN
  Administered 2023-03-31 (×2): 0.5 mg via INTRAVENOUS

## 2023-03-31 MED ORDER — PHENYLEPHRINE 80 MCG/ML (10ML) SYRINGE FOR IV PUSH (FOR BLOOD PRESSURE SUPPORT)
PREFILLED_SYRINGE | INTRAVENOUS | Status: AC
  Filled 2023-03-31: qty 10

## 2023-03-31 MED ORDER — OXYCODONE HCL 5 MG PO TABS
10.0000 mg | ORAL_TABLET | ORAL | Status: DC | PRN
  Administered 2023-03-31 – 2023-04-01 (×4): 10 mg via ORAL
  Filled 2023-03-31 (×4): qty 2

## 2023-03-31 MED ORDER — MIDAZOLAM HCL 2 MG/2ML IJ SOLN
INTRAMUSCULAR | Status: DC | PRN
  Administered 2023-03-31 (×2): 1 mg via INTRAVENOUS

## 2023-03-31 MED ORDER — SODIUM CHLORIDE 0.9% FLUSH: 3.0000 mL | INTRAVENOUS | Status: DC | PRN

## 2023-03-31 MED ORDER — VITAMIN B-12 1000 MCG PO TABS
2000.0000 ug | ORAL_TABLET | Freq: Every day | ORAL | Status: DC
  Administered 2023-04-01: 2000 ug via ORAL
  Filled 2023-03-31: qty 2
  Filled 2023-03-31: qty 20

## 2023-03-31 MED ORDER — METHOCARBAMOL 1000 MG/10ML IJ SOLN: 500.0000 mg | Freq: Four times a day (QID) | INTRAMUSCULAR | Status: DC | PRN

## 2023-03-31 MED ORDER — SENNOSIDES-DOCUSATE SODIUM 8.6-50 MG PO TABS: 1.0000 | ORAL_TABLET | Freq: Every evening | ORAL | Status: DC | PRN

## 2023-03-31 MED ORDER — LIDOCAINE 2% (20 MG/ML) 5 ML SYRINGE
INTRAMUSCULAR | Status: AC
  Filled 2023-03-31: qty 5

## 2023-03-31 MED ORDER — METHOCARBAMOL 500 MG PO TABS
ORAL_TABLET | ORAL | Status: AC
  Filled 2023-03-31: qty 1

## 2023-03-31 MED ORDER — MORPHINE SULFATE (PF) 2 MG/ML IV SOLN: 2.0000 mg | INTRAVENOUS | Status: DC | PRN

## 2023-03-31 MED ORDER — EZETIMIBE 10 MG PO TABS
10.0000 mg | ORAL_TABLET | Freq: Every day | ORAL | Status: DC
  Administered 2023-04-01: 10 mg via ORAL
  Filled 2023-03-31: qty 1

## 2023-03-31 MED ORDER — VITAMIN D 25 MCG (1000 UNIT) PO TABS
2000.0000 [IU] | ORAL_TABLET | Freq: Every day | ORAL | Status: DC
  Administered 2023-03-31 – 2023-04-01 (×2): 2000 [IU] via ORAL
  Filled 2023-03-31 (×2): qty 2

## 2023-03-31 MED ORDER — 0.9 % SODIUM CHLORIDE (POUR BTL) OPTIME
TOPICAL | Status: DC | PRN
  Administered 2023-03-31: 1000 mL

## 2023-03-31 MED ORDER — DEXAMETHASONE SODIUM PHOSPHATE 10 MG/ML IJ SOLN
INTRAMUSCULAR | Status: AC
Start: 2023-03-31 — End: ?
  Filled 2023-03-31: qty 1

## 2023-03-31 MED ORDER — LORATADINE 10 MG PO TABS
10.0000 mg | ORAL_TABLET | Freq: Every day | ORAL | Status: DC
  Administered 2023-03-31: 10 mg via ORAL
  Filled 2023-03-31: qty 1

## 2023-03-31 MED ORDER — ORAL CARE MOUTH RINSE: 15.0000 mL | Freq: Once | OROMUCOSAL | Status: AC

## 2023-03-31 MED ORDER — DEXAMETHASONE SODIUM PHOSPHATE 4 MG/ML IJ SOLN
4.0000 mg | Freq: Four times a day (QID) | INTRAMUSCULAR | Status: DC
  Administered 2023-03-31 (×2): 4 mg via INTRAVENOUS
  Filled 2023-03-31 (×2): qty 1

## 2023-03-31 MED ORDER — OXYCODONE HCL 5 MG PO TABS
ORAL_TABLET | ORAL | Status: AC
  Filled 2023-03-31: qty 1

## 2023-03-31 MED ORDER — OXYCODONE HCL 5 MG PO TABS
5.0000 mg | ORAL_TABLET | Freq: Once | ORAL | Status: AC | PRN
  Administered 2023-03-31: 5 mg via ORAL

## 2023-03-31 MED ORDER — FENTANYL CITRATE (PF) 250 MCG/5ML IJ SOLN
INTRAMUSCULAR | Status: DC | PRN
  Administered 2023-03-31: 50 ug via INTRAVENOUS
  Administered 2023-03-31: 150 ug via INTRAVENOUS
  Administered 2023-03-31: 50 ug via INTRAVENOUS

## 2023-03-31 MED ORDER — CEFAZOLIN SODIUM-DEXTROSE 2-4 GM/100ML-% IV SOLN
INTRAVENOUS | Status: AC
  Filled 2023-03-31: qty 100

## 2023-03-31 MED ORDER — MENTHOL 3 MG MT LOZG: 1.0000 | LOZENGE | OROMUCOSAL | Status: DC | PRN

## 2023-03-31 MED ORDER — PHENYLEPHRINE 80 MCG/ML (10ML) SYRINGE FOR IV PUSH (FOR BLOOD PRESSURE SUPPORT)
PREFILLED_SYRINGE | INTRAVENOUS | Status: DC | PRN
  Administered 2023-03-31 (×2): 160 ug via INTRAVENOUS

## 2023-03-31 MED ORDER — PANTOPRAZOLE SODIUM 40 MG PO TBEC
40.0000 mg | DELAYED_RELEASE_TABLET | Freq: Two times a day (BID) | ORAL | Status: DC
Start: 2023-03-31 — End: 2023-04-01
  Administered 2023-03-31 – 2023-04-01 (×2): 40 mg via ORAL
  Filled 2023-03-31 (×2): qty 1

## 2023-03-31 MED ORDER — SUGAMMADEX SODIUM 200 MG/2ML IV SOLN
INTRAVENOUS | Status: AC
  Filled 2023-03-31: qty 2

## 2023-03-31 MED ORDER — ACETAMINOPHEN 325 MG PO TABS: 650.0000 mg | ORAL_TABLET | ORAL | Status: DC | PRN

## 2023-03-31 MED ORDER — POTASSIUM CHLORIDE IN NACL 20-0.9 MEQ/L-% IV SOLN
INTRAVENOUS | Status: DC
  Filled 2023-03-31: qty 1000

## 2023-03-31 MED ORDER — CALCIUM CHLORIDE 10 % IV SOLN
INTRAVENOUS | Status: DC | PRN
Start: 2023-03-31 — End: 2023-03-31
  Administered 2023-03-31: 300 mg via INTRAVENOUS

## 2023-03-31 MED ORDER — HYDROMORPHONE HCL 1 MG/ML IJ SOLN
INTRAMUSCULAR | Status: AC
  Filled 2023-03-31: qty 1

## 2023-03-31 MED ORDER — RANOLAZINE ER 500 MG PO TB12
1000.0000 mg | ORAL_TABLET | Freq: Two times a day (BID) | ORAL | Status: DC
Start: 2023-03-31 — End: 2023-04-01
  Administered 2023-03-31 – 2023-04-01 (×2): 1000 mg via ORAL
  Filled 2023-03-31 (×2): qty 2

## 2023-03-31 MED ORDER — ACETAMINOPHEN 650 MG RE SUPP: 650.0000 mg | RECTAL | Status: DC | PRN

## 2023-03-31 MED ORDER — BISACODYL 5 MG PO TBEC
5.0000 mg | DELAYED_RELEASE_TABLET | Freq: Every day | ORAL | Status: DC | PRN
  Administered 2023-03-31: 5 mg via ORAL
  Filled 2023-03-31: qty 1

## 2023-03-31 MED ORDER — MEPERIDINE HCL 25 MG/ML IJ SOLN: 6.2500 mg | INTRAMUSCULAR | Status: DC | PRN

## 2023-03-31 MED ORDER — ONDANSETRON HCL 4 MG/2ML IJ SOLN
INTRAMUSCULAR | Status: DC | PRN
  Administered 2023-03-31: 4 mg via INTRAVENOUS

## 2023-03-31 MED ORDER — ROCURONIUM BROMIDE 10 MG/ML (PF) SYRINGE
PREFILLED_SYRINGE | INTRAVENOUS | Status: DC | PRN
  Administered 2023-03-31: 5 mg via INTRAVENOUS
  Administered 2023-03-31: 60 mg via INTRAVENOUS
  Administered 2023-03-31 (×2): 10 mg via INTRAVENOUS
  Administered 2023-03-31: 5 mg via INTRAVENOUS
  Administered 2023-03-31: 10 mg via INTRAVENOUS

## 2023-03-31 MED ORDER — ASPIRIN 81 MG PO TBEC
81.0000 mg | DELAYED_RELEASE_TABLET | Freq: Every day | ORAL | Status: DC
  Administered 2023-03-31 – 2023-04-01 (×2): 81 mg via ORAL
  Filled 2023-03-31 (×2): qty 1

## 2023-03-31 MED ORDER — ATORVASTATIN CALCIUM 80 MG PO TABS
80.0000 mg | ORAL_TABLET | Freq: Every evening | ORAL | Status: DC
  Administered 2023-03-31: 80 mg via ORAL
  Filled 2023-03-31: qty 1

## 2023-03-31 MED ORDER — OXYCODONE HCL 5 MG PO TABS
5.0000 mg | ORAL_TABLET | ORAL | Status: DC | PRN
  Administered 2023-04-01: 5 mg via ORAL
  Filled 2023-03-31: qty 1

## 2023-03-31 MED ORDER — DEXMEDETOMIDINE HCL IN NACL 80 MCG/20ML IV SOLN
INTRAVENOUS | Status: DC | PRN
  Administered 2023-03-31 (×2): 8 ug via INTRAVENOUS
  Administered 2023-03-31: 16 ug via INTRAVENOUS

## 2023-03-31 MED ORDER — OXYCODONE HCL 5 MG/5ML PO SOLN: 5.0000 mg | Freq: Once | ORAL | Status: AC | PRN

## 2023-03-31 MED ORDER — SODIUM CHLORIDE 0.9 % IV SOLN
250.0000 mL | INTRAVENOUS | Status: DC
  Administered 2023-03-31: 250 mL via INTRAVENOUS

## 2023-03-31 MED ORDER — LIDOCAINE-EPINEPHRINE 0.5 %-1:200000 IJ SOLN
INTRAMUSCULAR | Status: AC
  Filled 2023-03-31: qty 50

## 2023-03-31 MED ORDER — DEXMEDETOMIDINE HCL IN NACL 80 MCG/20ML IV SOLN
INTRAVENOUS | Status: AC
  Filled 2023-03-31: qty 20

## 2023-03-31 MED ORDER — CEFAZOLIN SODIUM-DEXTROSE 2-3 GM-%(50ML) IV SOLR
INTRAVENOUS | Status: DC | PRN
  Administered 2023-03-31: 2 g via INTRAVENOUS

## 2023-03-31 MED ORDER — THROMBIN 20000 UNITS EX SOLR
CUTANEOUS | Status: AC
  Filled 2023-03-31: qty 20000

## 2023-03-31 MED ORDER — LIDOCAINE 2% (20 MG/ML) 5 ML SYRINGE
INTRAMUSCULAR | Status: DC | PRN
  Administered 2023-03-31: 20 mg via INTRAVENOUS

## 2023-03-31 MED ORDER — ISOSORBIDE MONONITRATE ER 60 MG PO TB24
90.0000 mg | ORAL_TABLET | Freq: Every day | ORAL | Status: DC
  Administered 2023-04-01: 90 mg via ORAL
  Filled 2023-03-31: qty 2

## 2023-03-31 MED ORDER — CHLORHEXIDINE GLUCONATE 0.12 % MT SOLN
15.0000 mL | Freq: Once | OROMUCOSAL | Status: AC
  Administered 2023-03-31: 15 mL via OROMUCOSAL
  Filled 2023-03-31: qty 15

## 2023-03-31 MED ORDER — MECLIZINE HCL 25 MG PO TABS
25.0000 mg | ORAL_TABLET | Freq: Three times a day (TID) | ORAL | Status: DC | PRN
  Administered 2023-04-01: 25 mg via ORAL
  Filled 2023-03-31 (×2): qty 1

## 2023-03-31 MED ORDER — PHENOL 1.4 % MT LIQD: 1.0000 | OROMUCOSAL | Status: DC | PRN

## 2023-03-31 MED ORDER — SODIUM CHLORIDE 0.9 % IV SOLN
INTRAVENOUS | Status: DC | PRN
Start: 2023-03-31 — End: 2023-03-31

## 2023-03-31 MED ORDER — LIDOCAINE-EPINEPHRINE 0.5 %-1:200000 IJ SOLN
INTRAMUSCULAR | Status: DC | PRN
  Administered 2023-03-31: 7 mL

## 2023-03-31 MED ORDER — MIDAZOLAM HCL 2 MG/2ML IJ SOLN
INTRAMUSCULAR | Status: AC
  Filled 2023-03-31: qty 2

## 2023-03-31 MED ORDER — DOCUSATE SODIUM 100 MG PO CAPS
100.0000 mg | ORAL_CAPSULE | Freq: Two times a day (BID) | ORAL | Status: DC
  Administered 2023-03-31 – 2023-04-01 (×2): 100 mg via ORAL
  Filled 2023-03-31 (×2): qty 1

## 2023-03-31 MED ORDER — DEXAMETHASONE SODIUM PHOSPHATE 10 MG/ML IJ SOLN
INTRAMUSCULAR | Status: DC | PRN
  Administered 2023-03-31: 10 mg via INTRAVENOUS

## 2023-03-31 MED ORDER — PROPOFOL 10 MG/ML IV BOLUS
INTRAVENOUS | Status: AC
  Filled 2023-03-31: qty 20

## 2023-03-31 MED ORDER — DEXAMETHASONE 4 MG PO TABS
4.0000 mg | ORAL_TABLET | Freq: Four times a day (QID) | ORAL | Status: DC
  Administered 2023-04-01 (×2): 4 mg via ORAL
  Filled 2023-03-31 (×2): qty 1

## 2023-03-31 MED ORDER — THROMBIN 5000 UNITS EX SOLR: OROMUCOSAL | Status: DC | PRN

## 2023-03-31 MED ORDER — SODIUM CHLORIDE 0.9% FLUSH
3.0000 mL | Freq: Two times a day (BID) | INTRAVENOUS | Status: DC
  Administered 2023-03-31: 3 mL via INTRAVENOUS

## 2023-03-31 MED ORDER — MIDAZOLAM HCL 2 MG/2ML IJ SOLN: 0.5000 mg | Freq: Once | INTRAMUSCULAR | Status: DC | PRN

## 2023-03-31 MED ORDER — FENTANYL CITRATE (PF) 250 MCG/5ML IJ SOLN
INTRAMUSCULAR | Status: AC
  Filled 2023-03-31: qty 5

## 2023-03-31 MED ORDER — ESMOLOL HCL 100 MG/10ML IV SOLN
INTRAVENOUS | Status: AC
  Filled 2023-03-31: qty 10

## 2023-03-31 MED ORDER — METHOCARBAMOL 500 MG PO TABS
500.0000 mg | ORAL_TABLET | Freq: Four times a day (QID) | ORAL | Status: DC | PRN
  Administered 2023-03-31 – 2023-04-01 (×3): 500 mg via ORAL
  Filled 2023-03-31 (×2): qty 1

## 2023-03-31 MED ORDER — DIAZEPAM 5 MG PO TABS: 5.0000 mg | ORAL_TABLET | Freq: Two times a day (BID) | ORAL | Status: DC | PRN

## 2023-03-31 MED ORDER — LACTATED RINGERS IV SOLN: INTRAVENOUS | Status: DC

## 2023-03-31 MED ORDER — ACETAMINOPHEN 500 MG PO TABS
1000.0000 mg | ORAL_TABLET | Freq: Once | ORAL | Status: AC
  Administered 2023-03-31: 1000 mg via ORAL
  Filled 2023-03-31: qty 2

## 2023-03-31 MED ORDER — SUGAMMADEX SODIUM 200 MG/2ML IV SOLN
INTRAVENOUS | Status: DC | PRN
  Administered 2023-03-31: 200 mg via INTRAVENOUS

## 2023-03-31 MED ORDER — EPHEDRINE SULFATE-NACL 50-0.9 MG/10ML-% IV SOSY
PREFILLED_SYRINGE | INTRAVENOUS | Status: DC | PRN
Start: 2023-03-31 — End: 2023-03-31
  Administered 2023-03-31 (×2): 5 mg via INTRAVENOUS
  Administered 2023-03-31: 10 mg via INTRAVENOUS
  Administered 2023-03-31: 5 mg via INTRAVENOUS

## 2023-03-31 MED ORDER — MAGNESIUM CITRATE PO SOLN: 1.0000 | Freq: Once | ORAL | Status: DC | PRN

## 2023-03-31 MED ORDER — ONDANSETRON HCL 4 MG/2ML IJ SOLN
INTRAMUSCULAR | Status: AC
  Filled 2023-03-31: qty 2

## 2023-03-31 MED ORDER — ONDANSETRON HCL 4 MG/2ML IJ SOLN: 4.0000 mg | Freq: Four times a day (QID) | INTRAMUSCULAR | Status: DC | PRN

## 2023-03-31 SURGICAL SUPPLY — 51 items
BAG COUNTER SPONGE SURGICOUNT (BAG) ×2 IMPLANT
BAND RUBBER #18 3X1/16 STRL (MISCELLANEOUS) ×4 IMPLANT
BASKET BONE COLLECTION (BASKET) IMPLANT
BLADE CLIPPER SURG (BLADE) IMPLANT
BUR DRUM 4.0 (BURR) ×2 IMPLANT
BUR MATCHSTICK NEURO 3.0 LAGG (BURR) ×2 IMPLANT
BUR ROUND FLUTED 4 SOFT TCH (BURR) IMPLANT
CAGE COPR SANTORINI 14X12X32 (Cage) IMPLANT
CANISTER SUCT 3000ML PPV (MISCELLANEOUS) ×2 IMPLANT
DERMABOND ADVANCED .7 DNX12 (GAUZE/BANDAGES/DRESSINGS) ×1 IMPLANT
DRAPE HALF SHEET 40X57 (DRAPES) IMPLANT
DRAPE LAPAROTOMY 100X72 PEDS (DRAPES) ×2 IMPLANT
DRAPE MICROSCOPE SLANT 54X150 (MISCELLANEOUS) ×1 IMPLANT
DURAPREP 6ML APPLICATOR 50/CS (WOUND CARE) ×1 IMPLANT
ELECT COATED BLADE 2.86 ST (ELECTRODE) ×1 IMPLANT
ELECT REM PT RETURN 9FT ADLT (ELECTROSURGICAL) ×1 IMPLANT
ELECTRODE REM PT RTRN 9FT ADLT (ELECTROSURGICAL) ×1 IMPLANT
EVACUATOR 1/8 PVC DRAIN (DRAIN) IMPLANT
GAUZE 4X4 16PLY ~~LOC~~+RFID DBL (SPONGE) IMPLANT
GLOVE ECLIPSE 6.5 STRL STRAW (GLOVE) ×2 IMPLANT
GLOVE EXAM NITRILE XL STR (GLOVE) IMPLANT
GOWN STRL REUS W/ TWL LRG LVL3 (GOWN DISPOSABLE) ×4 IMPLANT
GOWN STRL REUS W/ TWL XL LVL3 (GOWN DISPOSABLE) IMPLANT
GOWN STRL REUS W/TWL 2XL LVL3 (GOWN DISPOSABLE) IMPLANT
HALTER HD/CHIN CERV TRACTION D (MISCELLANEOUS) IMPLANT
HEMOSTAT POWDER KIT SURGIFOAM (HEMOSTASIS) IMPLANT
KIT BASIN OR (CUSTOM PROCEDURE TRAY) ×1 IMPLANT
KIT TURNOVER KIT B (KITS) ×1 IMPLANT
NDL HYPO 25X1 1.5 SAFETY (NEEDLE) ×1 IMPLANT
NDL SPNL 22GX3.5 QUINCKE BK (NEEDLE) ×2 IMPLANT
NEEDLE HYPO 25X1 1.5 SAFETY (NEEDLE) ×1 IMPLANT
NEEDLE SPNL 22GX3.5 QUINCKE BK (NEEDLE) ×1 IMPLANT
NS IRRIG 1000ML POUR BTL (IV SOLUTION) ×2 IMPLANT
PACK LAMINECTOMY NEURO (CUSTOM PROCEDURE TRAY) ×1 IMPLANT
PAD ARMBOARD 7.5X6 YLW CONV (MISCELLANEOUS) ×2 IMPLANT
PIN DISTRACTION 14MM (PIN) IMPLANT
PLATE ACP 46 1.9H 2L (Plate) IMPLANT
PLATE ACP 50 1.9H 2L (Plate) IMPLANT
SCREW ACP 3.5X13 S/T FIX (Screw) IMPLANT
SCREW ACP VA SD 3.5X15 (Screw) IMPLANT
SCREW ACP VA ST 4X13 (Screw) IMPLANT
SCREW FA ACP SD 3.5X15 (Screw) IMPLANT
SCREW VA ST 4.0X15 (Screw) IMPLANT
SPIKE FLUID TRANSFER (MISCELLANEOUS) ×1 IMPLANT
SPONGE INTESTINAL PEANUT (DISPOSABLE) ×2 IMPLANT
SPONGE SURGIFOAM ABS GEL 100 (HEMOSTASIS) IMPLANT
SUT VIC AB 0 CT1 27XBRD ANTBC (SUTURE) IMPLANT
SUT VIC AB 3-0 SH 8-18 (SUTURE) ×1 IMPLANT
TOWEL GREEN STERILE (TOWEL DISPOSABLE) ×1 IMPLANT
TOWEL GREEN STERILE FF (TOWEL DISPOSABLE) ×1 IMPLANT
WATER STERILE IRR 1000ML POUR (IV SOLUTION) ×1 IMPLANT

## 2023-03-31 NOTE — Transfer of Care (Signed)
 Immediate Anesthesia Transfer of Care Note  Patient: Bill Johnson  Procedure(s) Performed: CERVICAL FIVE AND SIX CORPECTOMY WITH PLATING (Spine Cervical)  Patient Location: PACU  Anesthesia Type:General  Level of Consciousness: awake, alert , patient cooperative, and responds to stimulation  Airway & Oxygen Therapy: Patient Spontanous Breathing and Patient connected to face mask oxygen  Post-op Assessment: Report given to RN and Post -op Vital signs reviewed and stable  Post vital signs: Reviewed and stable  Last Vitals:  Vitals Value Taken Time  BP 94/66 03/31/23 1402  Temp 98.4 03/31/23 1402  Pulse 95 03/31/23 1402  Resp 19 03/31/23 1402  SpO2 93 % 03/31/23 1402  Vitals shown include unfiled device data.  Pt denies pain.   Complications: No notable events documented.

## 2023-03-31 NOTE — Anesthesia Procedure Notes (Signed)
 Procedure Name: Intubation Date/Time: 03/31/2023 9:23 AM  Performed by: Flonnie Hailstone, CRNAPre-anesthesia Checklist: Patient identified, Emergency Drugs available, Suction available and Patient being monitored Patient Re-evaluated:Patient Re-evaluated prior to induction Oxygen Delivery Method: Circle system utilized Preoxygenation: Pre-oxygenation with 100% oxygen Induction Type: IV induction Ventilation: Mask ventilation without difficulty Laryngoscope Size: Glidescope and 3 Grade View: Grade I Tube type: Oral Tube size: 8.0 mm Number of attempts: 1 Airway Equipment and Method: Stylet, Rigid stylet and Video-laryngoscopy Placement Confirmation: ETT inserted through vocal cords under direct vision, positive ETCO2 and breath sounds checked- equal and bilateral Secured at: 21 cm Tube secured with: Tape Dental Injury: Teeth and Oropharynx as per pre-operative assessment  Comments: intubated by Samul Dada, CRNA; ebbs

## 2023-03-31 NOTE — Op Note (Signed)
 03/31/2023  6:55 PM  PATIENT:  Bill Johnson  68 y.o. male With cervical spondylosis and cervical radiculopathies. He has agreed to operative decompression.  PRE-OPERATIVE DIAGNOSIS:  Osteoarthritis of facet joint of cervical spine  POST-OPERATIVE DIAGNOSIS:  Osteoarthritis of facet joint of cervical spine  PROCEDURE:  Anterior Cervical decompression C4-7 via C5,C6 corpectomies, with microscopic dissection Arthrodesis C4-7 with 32mm Peek cage filled with autograft morsels Anterior instrumentation(Nuvasive) C4-7  SURGEON:   Surgeon(s): Coletta Memos, MD   ASSISTANTS:none  ANESTHESIA:   general  EBL:  Total I/O In: 3050 [I.V.:2750; IV Piggyback:300] Out: 1050 [Urine:600; Blood:450]  BLOOD ADMINISTERED:none  CELL SAVER GIVEN:none  COUNT:per nursing  DRAINS: (1 medium) Hemovact drain(s) in the prevertebral space with  Suction Open   SPECIMEN:  No Specimen  DICTATION: Mr. Dahms was taken to the operating room, intubated, and placed under general anesthesia without difficulty. He was positioned supine with his head in slight extension on a horseshoe headrest. The neck was prepped and draped in a sterile manner. I infiltrated 4 cc's 1/2%lidocaine/1:200,000 strength epinephrine into the planned incision starting from the midline to the medial border of the left sternocleidomastoid muscle. I opened the incision with a 10 blade and dissected sharply through soft tissue to the platysma. I dissected in the plane superior to the platysma both rostrally and caudally. I then opened the platysma in a horizontal fashion with Metzenbaum scissors, and dissected in the inferior plane rostrally and caudally. With both blunt and sharp technique I created an avascular corridor to the cervical spine. I placed a spinal needle(s) in the disc space at C3/4, and 4/5 and reliably located the 4/5 disc space . I then reflected the longus colli from C4 to C7 and placed self retaining retractors. I opened the  disc space(s) at C4/5,5/6, and 6/7 with a 15 blade. I removed disc with curettes, Kerrison punches, and the drill. Using the drill I performed corpectomies of C5, and C6 along with Kerrison punches. I decompressed the spinal canal and the C5,6,and 7 root(s) with the drill, Kerrison punches, and the curettes.  I removed the posterior longitudinal ligament to fully expose and decompress the thecal sac. I exposed the roots laterally taking down the 4/5,5/6, and the 6/7 uncovertebral joints. With the decompression complete I moved on to the arthrodesis. I used the drill to level the surfaces of C4 and C7. I removed soft tissue to prepare the disc space and the bony surfaces. I measured the space and placed a 32mm Peek Strut packed with autograft into the space.  I then placed the anterior instrumentation. I placed 2 screws in each vertebral body through the plate. I locked the screws into place. Intraoperative xray showed the graft, plate, and screws to be in good position. I irrigated the wound, achieved hemostasis, and closed the wound in layers. I approximated the platysma, and the subcuticular plane with vicryl sutures. I used Dermabond for a sterile dressing.   PLAN OF CARE: Admit to inpatient   PATIENT DISPOSITION:  PACU - hemodynamically stable.   Delay start of Pharmacological VTE agent (>24hrs) due to surgical blood loss or risk of bleeding:  yes

## 2023-03-31 NOTE — H&P (Signed)
 BP (!) 142/78 (BP Location: Right Arm)   Pulse 68   Temp 97.6 F (36.4 C) (Oral)   Resp 16   Ht 5\' 7"  (1.702 m)   Wt 66.5 kg   SpO2 97%   BMI 22.96 kg/m   Bill Johnson is here today for evaluation of neck pain that he has had he says for at least 4 years.  The pain is quite severe, causing him to actually shed tears at times.  He says he can do little to nothing secondary to the neck pain.  He associates it with a number of pops and cracks and moving his head from right to left is the most painful thing that he can do.  His pain has not been relieved with injections or anything else.  He has a recent MRI, which was performed on January 28th.  On his clinic note from 01/25 when he had an office visit with my colleague, Celedonio Miyamoto, he had radiofrequency ablation procedures at C3-4 on the right and on the left side at C4-5.  And he would get more than 50% relief for over 6 months, but this time he did not.  He says that the pain just got worse and at this point time he wanted to seek a surgical evaluation.  He does have rotator cuff problem, which is treated by Orthopedics.  He uses a brace on his left hand at times. He has had carpal tunnel surgery there.  He has taken a Medrol Dosepak to see if that will help.  He says that sometimes the pain is decreased.  He says the pain is 4/10.  He also feels that he is weak in both hands.  MEDICATIONS: Include Albuterol, Alprazolam, Amlodipine, Anoro Ellipta inhaler, Low-dose aspirin, Atenolol, Atorvastatin, Ezetomibe, Isosorbide Dinitrate, Meclizine, and the most recent Dosepak of Medrol was on January 28, 2023, but he actually is no longer taking the Albuterol or the Anoro is much better.     He had a right knee replacement.  He weighs 159 pounds.  Blood pressure is 130/79, pulse 69.  Pain is 4/10.  ALLERGIES: He has no known drug allergies.     He does not have bowel or bladder dysfunction.  PHYSICAL EXAMINATION: On exam, Bill Johnson is alert, oriented  by 4.  He is severely hard of hearing to voice.  Tongue and uvula in the midline.  Shoulder shrug is normal.  He has on manual exam 5/5 strength in the upper extremities.  Grip is about 4+ over 5.  He has normal strength in the lower extremities.  Romberg is negative.  He can tandem walk.  It takes some effort, but he is able to do it, and he has normal muscle tone and bulk in the lower extremities.  Gait is normal.  IMAGING: MRI of the cervical spine is reviewed and what he has is significant facet arthropathy at a number of levels.  But, the radiologist believes that he is fused at the  C3-4 level.  At 4-5, 5-6, 6-7, he has a great deal of spondylitic change and that spondylitic change is where he has a focal kyphosis centered at the 5-6 and 6-7 disc space.  He has some myelomalacia and altered cord signal at that level.  He cannot remember any injury to his spinal cord, but it certainly is present on T2 and STIR imaging.  His canal is actually not very stenotic at all at any level.  He has some mild foraminal  narrowing, but his biggest problem is not anything in the upper extremities.  His biggest problem as he states clearly is neck pain.  He has a gigantic 4-5 facet on the right side.  5-6 he has some foraminal narrowing and some mild stenosis in the canal and foraminal narrowing on both sides at 6-7 and again mild stenosis.  ASSESSMENT AND PLAN: Mr. Lege on his exam and given the MRI findings I think is a good operative candidate.  There is never a solution that guarantees all the pain will go away, and he is no different.  But, I do think he will be better.  He has considerable facet arthropathy and I think this is leading to some of the discomfort in his neck.  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  It is not clear to me whether I will do this as a two-level corpectomy or try to  utilize the bone that is there and just do 3 discectomies at 4-5, 5-6, and 6-7.  I have given Mr. Lucken a sheet that goes over all of the operation, is one that I wrote, and we will get him scheduled at a convenient time for himself.  I will also emphasize the only time it is too late to ask any question is after we put him to sleep.

## 2023-04-01 ENCOUNTER — Ambulatory Visit: Payer: 59 | Admitting: Podiatry

## 2023-04-01 MED ORDER — DEXAMETHASONE 4 MG PO TABS
4.0000 mg | ORAL_TABLET | Freq: Three times a day (TID) | ORAL | 0 refills | Status: AC
Start: 2023-04-01 — End: 2023-04-03

## 2023-04-01 MED ORDER — OXYCODONE HCL 5 MG PO TABS: 5.0000 mg | ORAL_TABLET | ORAL | 0 refills | Status: AC | PRN

## 2023-04-01 NOTE — Progress Notes (Signed)
 Patient alert and oriented, void, ambulate. Surgical site clean and dry no sign of infection. D/ c instructions explain and given to the patient all questions answered.

## 2023-04-01 NOTE — Evaluation (Signed)
 Occupational Therapy Evaluation Patient Details Name: Bill Johnson MRN: [id number] DOB: 1955-03-27 Today's Date: 04/01/2023   History of Present Illness   Bill Johnson is a 68 y.o. male presenting s/p anterior cervical decompression C4-7 via C5,C6 corpectomies, with microscopic dissection  Arthrodesis C4-7 on 03/31/2023. PMHx includes HTN, HLD, CAD, GERD, R TKA     Clinical Impressions Pt evaluated s/p admission list above. At baseline, pt lives at home alone, has a dog, completes all ADLs/IADLs and functional mobility tasks with up to MOD I. Upon evaluation, pt completed all aspects of functional mobility and ADLs with up to MOD I. Pt was able to recall cervical precautions from PT evaluation and reported using compensatory strategies prior to surgical procedure due to rotator cuff injury and previous operations. Pt adhered to both functionally without verbal cues. Pt does not have acute OT needs. Recommends pt discharge home with family support without follow-up OT services.    If plan is discharge home, recommend the following:   Assist for transportation;Assistance with cooking/housework;Help with stairs or ramp for entrance     Functional Status Assessment   Patient has had a recent decline in their functional status and demonstrates the ability to make significant improvements in function in a reasonable and predictable amount of time.     Equipment Recommendations   None recommended by OT     Recommendations for Other Services         Precautions/Restrictions   Precautions Precautions: Fall;Cervical Precaution Booklet Issued: Yes (comment) Recall of Precautions/Restrictions: Intact Precaution/Restrictions Comments: Reviewed precautions with pt prior to engaging in functional tasks Restrictions Weight Bearing Restrictions Per Provider Order: No     Mobility Bed Mobility Overal bed mobility: Modified Independent             General bed mobility  comments: Pt able to demonstrate bed mobility using log roll technique without physical assistance    Transfers Overall transfer level: Independent Equipment used: None               General transfer comment: Pt completed STS I. No LOB observed      Balance Overall balance assessment: No apparent balance deficits (not formally assessed)                                         ADL either performed or assessed with clinical judgement   ADL Overall ADL's : Modified independent                                             Vision Baseline Vision/History: 0 No visual deficits Ability to See in Adequate Light: 0 Adequate Patient Visual Report: No change from baseline Vision Assessment?: No apparent visual deficits     Perception Perception: Within Functional Limits       Praxis Praxis: Not tested       Pertinent Vitals/Pain Pain Assessment Pain Assessment: Faces Faces Pain Scale: Hurts a little bit Pain Location: incision site Pain Descriptors / Indicators: Discomfort, Sore Pain Intervention(s): Monitored during session, Ice applied     Extremity/Trunk Assessment Upper Extremity Assessment Upper Extremity Assessment: Overall WFL for tasks assessed   Lower Extremity Assessment Lower Extremity Assessment: Defer to PT evaluation   Cervical / Trunk Assessment Cervical / Trunk Assessment:  Neck Surgery   Communication Communication Communication: No apparent difficulties Factors Affecting Communication: Hearing impaired   Cognition Arousal: Alert Behavior During Therapy: WFL for tasks assessed/performed Cognition: No apparent impairments             OT - Cognition Comments: Answered questions appropriately and adhered to cervical precautions functionally                 Following commands: Intact       Cueing  General Comments   Cueing Techniques: Verbal cues  VSS on RA   Exercises     Shoulder  Instructions      Home Living Family/patient expects to be discharged to:: Private residence Living Arrangements: Alone Available Help at Discharge: Family;Friend(s);Available PRN/intermittently Type of Home: House             Bathroom Shower/Tub: Chief Strategy Officer: Standard                Prior Functioning/Environment Prior Level of Function : Independent/Modified Independent;Driving             Mobility Comments: Pt reports completed functional mobility independently ADLs Comments: Pt completed ADLs/IADLs MODI-I. Pt reported using compensatory strategies on handout prior to procedure due to previous operations and injuries. Pt also reports having a dog at home that he cares for    OT Problem List: Decreased strength;Decreased activity tolerance;Decreased knowledge of precautions   OT Treatment/Interventions:        OT Goals(Current goals can be found in the care plan section)   Acute Rehab OT Goals Patient Stated Goal: home OT Goal Formulation: With patient Time For Goal Achievement: 04/01/23 Potential to Achieve Goals: Good   OT Frequency:       Co-evaluation              AM-PAC OT "6 Clicks" Daily Activity     Outcome Measure Help from another person eating meals?: None Help from another person taking care of personal grooming?: None Help from another person toileting, which includes using toliet, bedpan, or urinal?: None Help from another person bathing (including washing, rinsing, drying)?: None Help from another person to put on and taking off regular upper body clothing?: None Help from another person to put on and taking off regular lower body clothing?: None 6 Click Score: 24   End of Session Equipment Utilized During Treatment: Gait belt Nurse Communication: Mobility status  Activity Tolerance: Patient tolerated treatment well Patient left: in bed;with call bell/phone within reach  OT Visit Diagnosis: Unsteadiness  on feet (R26.81);Other abnormalities of gait and mobility (R26.89);Muscle weakness (generalized) (M62.81)                Time: 5621-3086 OT Time Calculation (min): 20 min Charges:  OT General Charges $OT Visit: 1 Visit OT Evaluation $OT Eval Low Complexity: 1 Low  [address], [place]  Bill Johnson 04/01/2023, 10:15 AM

## 2023-04-01 NOTE — Evaluation (Signed)
 Physical Therapy Evaluation and DIscharge Patient Details Name: Bill Johnson MRN: [id number] DOB: 01-30-1955 Today's Date: 04/01/2023  History of Present Illness  Bill Johnson is a 68 y.o. male presenting s/p anterior cervical decompression C4-7 via C5,C6 corpectomies, with microscopic dissection  Arthrodesis C4-7 on 03/31/2023. PMHx includes HTN, HLD, CAD, GERD, R TKA  Clinical Impression  Patient evaluated by Physical Therapy with no further acute PT needs identified. All education has been completed and the patient has no further questions. Pt reports less pain now than before surgery and complains most of soreness at incision and throat. Pt from home alone but nephew is coming to stay with him a few days. Pt educated on precautions and activity modifications for home as well as posture. Pt mobilizing at mod I level, ready for d/c home.  See below for any follow-up Physical Therapy or equipment needs. PT is signing off. Thank you for this referral.         If plan is discharge home, recommend the following: Assistance with cooking/housework;Assist for transportation   Can travel by private vehicle        Equipment Recommendations None recommended by PT  Recommendations for Other Services       Functional Status Assessment Patient has not had a recent decline in their functional status     Precautions / Restrictions Precautions Precautions: Fall;Cervical Precaution Booklet Issued: Yes (comment) Recall of Precautions/Restrictions: Intact Precaution/Restrictions Comments: reviewed precautions Restrictions Weight Bearing Restrictions Per Provider Order: No      Mobility  Bed Mobility Overal bed mobility: Modified Independent             General bed mobility comments: Pt able to demonstrate bed mobility using log roll technique without physical assistance. Needed vc's for return to supine    Transfers Overall transfer level: Independent Equipment used: None                General transfer comment: pt has vertigo at times, encouraged to wait before ambulating to make sure he's not dizzy, for fall prevention    Ambulation/Gait Ambulation/Gait assistance: Modified independent (Device/Increase time) Gait Distance (Feet): 400 Feet Assistive device: None Gait Pattern/deviations: Step-through pattern Gait velocity: WFL Gait velocity interpretation: >4.37 ft/sec, indicative of normal walking speed   General Gait Details: kyphotic posture, cues for abdominal activation and maintaining as erect posture as possible  Stairs            Wheelchair Mobility     Tilt Bed    Modified Rankin (Stroke Patients Only)       Balance Overall balance assessment: No apparent balance deficits (not formally assessed)                                           Pertinent Vitals/Pain Pain Assessment Pain Assessment: Faces Faces Pain Scale: Hurts a little bit Pain Location: incision site, throat Pain Descriptors / Indicators: Discomfort, Sore Pain Intervention(s): Limited activity within patient's tolerance, Monitored during session    Home Living Family/patient expects to be discharged to:: Private residence Living Arrangements: Alone Available Help at Discharge: Family;Friend(s);Available PRN/intermittently Type of Home: House Home Access: Level entry       Home Layout: One level Home Equipment: Agricultural consultant (2 wheels);Cane - single point;Shower seat Additional Comments: lives alone but nephew is coming to stay with him for a few days    Prior  Function Prior Level of Function : Independent/Modified Independent;Driving             Mobility Comments: Pt reports completed functional mobility independently, still mows own grass ADLs Comments: independent     Extremity/Trunk Assessment   Upper Extremity Assessment Upper Extremity Assessment: Defer to OT evaluation    Lower Extremity Assessment Lower Extremity  Assessment: Overall WFL for tasks assessed (h/o R TKA)    Cervical / Trunk Assessment Cervical / Trunk Assessment: Neck Surgery  Communication   Communication Communication: No apparent difficulties Factors Affecting Communication: Hearing impaired    Cognition Arousal: Alert Behavior During Therapy: WFL for tasks assessed/performed   PT - Cognitive impairments: No apparent impairments                         Following commands: Intact       Cueing Cueing Techniques: Verbal cues     General Comments General comments (skin integrity, edema, etc.): discussed activity modifications and proper posture    Exercises Other Exercises Other Exercises: standing with back to wall gently pushing head towards wall, 5x Other Exercises: IS x5, pulled 2500   Assessment/Plan    PT Assessment Patient does not need any further PT services  PT Problem List         PT Treatment Interventions      PT Goals (Current goals can be found in the Care Plan section)  Acute Rehab PT Goals Patient Stated Goal: get back to walking and mowing his own yard PT Goal Formulation: All assessment and education complete, DC therapy    Frequency       Johnson-evaluation               AM-PAC PT "6 Clicks" Mobility  Outcome Measure Help needed turning from your back to your side while in a flat bed without using bedrails?: None Help needed moving from lying on your back to sitting on the side of a flat bed without using bedrails?: None Help needed moving to and from a bed to a chair (including a wheelchair)?: None Help needed standing up from a chair using your arms (e.g., wheelchair or bedside chair)?: None Help needed to walk in hospital room?: None Help needed climbing 3-5 steps with a railing? : None 6 Click Score: 24    End of Session   Activity Tolerance: Patient tolerated treatment well Patient left: in bed;with call bell/phone within reach Nurse Communication: Mobility  status PT Visit Diagnosis: Pain Pain - part of body:  (neck, throat)    Time: 1610-9604 PT Time Calculation (min) (ACUTE ONLY): 20 min   Charges:   PT Evaluation $PT Eval Low Complexity: 1 Low   PT General Charges $$ ACUTE PT VISIT: 1 Visit         Bill Johnson, PT  Acute Rehab Services Secure chat preferred Office [phone]   Bill Johnson 04/01/2023, 10:32 AM

## 2023-04-01 NOTE — Anesthesia Postprocedure Evaluation (Signed)
 Anesthesia Post Note  Patient: Bill Johnson  Procedure(s) Performed: CERVICAL FIVE AND SIX CORPECTOMY WITH PLATING (Spine Cervical)     Patient location during evaluation: PACU Anesthesia Type: General Level of consciousness: awake and alert Pain management: pain level controlled Vital Signs Assessment: post-procedure vital signs reviewed and stable Respiratory status: spontaneous breathing, nonlabored ventilation, respiratory function stable and patient connected to nasal cannula oxygen Cardiovascular status: blood pressure returned to baseline and stable Postop Assessment: no apparent nausea or vomiting Anesthetic complications: no   There were no known notable events for this encounter.  Last Vitals:  Vitals:   04/01/23 0418 04/01/23 0718  BP: 126/72 132/72  Pulse: 96 100  Resp: 18 18  Temp: 36.6 C 37 C  SpO2: 94% 96%    Last Pain:  Vitals:   04/01/23 0832  TempSrc:   PainSc: 7                  Hosey Burmester P Sonya Gunnoe

## 2023-04-01 NOTE — Discharge Instructions (Signed)
Anterior Cervical Fusion Care After Pinching of the nerves is a common cause of long-term pain. When this happens, a procedure called an anterior cervical fusion is sometimes performed. It relieves the pressure on the pinched nerve roots or spinal cord in the neck. An anterior cervical fusion means that the operation is done through the front (anterior) of your neck to fuse bones in your neck together. This procedure is done to relieve the pressure on pinched nerve roots or spinal cord. This operation is done to control the movement of your spine, which may be pressing on the nerves. This may relieve the pain. The procedure that stops the movement of the spine is called a fusion. The cut by the surgeon (incision) is usually within a skin fold line under your chin. After moving the neck muscles gently apart, the neurosurgeon uses an operating microscope and removes the injured intervertebral disk (the cushion or pad of tissue between the bones of the spine). This takes the pressure off the nerves or spinal cord. This is called decompression. The area where the disc was removed is then filled with a bone graft. The graft will fuse the vertebrae together over time. This means it causes the vertebral bodies to grow together. The bone graft may be obtained from your own bone (your hip for example), or may be obtained from a bone bank. Receiving bone from a bone bank is similar to a blood bank, only the bone comes from human donors who have recently died. This type of graft is referred to as allograft bone. The preformed bone plug is safe and will not be rejected by your body. It does not contain blood cells. In some cases, the surgeon may use hardware in your neck to help stabilize it. This means that metal plates or pins or screws may be used to:  Provide extra support to the neck.   Help the bones to grow together more easily.  A cervical fusion procedure takes a couple hours to several hours, depending on  what needs to be done. Your caregiver will be able to answer your questions for you. HOME CARE INSTRUCTIONS   It will be normal to have a sore throat and have difficulty swallowing foods for a couple weeks following surgery. See your caregiver if this seems to be getting worse rather than better.   You may resume normal diet and activities as directed or allowed. Generally, walking and stair climbing are fine. Avoid lifting more than ten pounds and do no lifting above your head.   If given a cervical collar, remove only for bathing and eating, or as directed.   Use only showers for cleaning up, with no bathing, until seen.   You may apply ice to the surgical or bone donor site for 15 to 20 minutes each hour while awake for the first couple days following surgery. Put the ice in a plastic bag and place a towel between the bag of ice and your skin.   Change dressings if necessary or as directed.   You may drive in 10 days   Take prescribed medication as directed. Only take over-the-counter or prescription medicines for pain, discomfort, or fever as directed by your caregiver.   Make an appointment to see your caregiver for suture or staple removal when instructed.   If physical therapy was prescribed, follow your caregiver's directions.  SEEK IMMEDIATE MEDICAL CARE IF:  There is redness, swelling, or increasing pain in the wound.   There is  pus coming from the wound.   An unexplained oral temperature over 102 F (38.9 C) develops.   There is a bad smell coming from the wound or dressing.   You have swelling in your calf or leg.   You develop shortness of breath or chest pain.   The wound edges break open after sutures or staples have been removed.   Your pain is not controlled with medicine.   You seem to be getting worse rather than better.  Document Released: 08/27/2003 Document Revised: 09/24/2010 Document Reviewed: 11/02/2007 [facility] Patient Information 2012 [place].

## 2023-04-01 NOTE — Discharge Summary (Signed)
 Physician Discharge Summary  Patient ID: Bill Johnson MRN: [id number] DOB/AGE: 1955/07/10 68 y.o.  Admit date: 03/31/2023 Discharge date: 04/01/2023  Admission Diagnoses:cervical spondylosis C4/5,5/6,6/7 with radiculopathy  Discharge Diagnoses: same Principal Problem:   Cervical spondylosis with radiculopathy   Discharged Condition: good  Hospital Course: Bill Johnson was admitted and taken to the operating room for a Cervical Corpectomy C5,6 with Stryker peek implant filled with autograft, instrumentation from C4-C7. Nuvasive plating. Post op he is ambulating, voiding, and tolerating a regular diet.  Voice is raspy, wound with ecchymosis Treatments: surgery: Cervical Corpectomy C5,6 with anterior instrumentation C4-7  Discharge Exam: Blood pressure 132/72, pulse 100, temperature 98.6 F (37 C), temperature source Oral, resp. rate 18, height 5\' 7"  (1.702 m), weight 66.5 kg, SpO2 96%. General appearance: alert, cooperative, and no distress  Disposition: Discharge disposition: 01-Home or Self Care      Osteoarthritis of facet joint of cervical spine  Allergies as of 04/01/2023   No Known Allergies      Medication List     TAKE these medications    acetaminophen 500 MG tablet Commonly known as: TYLENOL Take 2 tablets (1,000 mg total) by mouth every 8 (eight) hours as needed for mild pain, headache, moderate pain or fever.   aspirin EC 81 MG tablet Take 81 mg by mouth daily. Swallow whole.   atorvastatin 80 MG tablet Commonly known as: LIPITOR Take 1 tablet (80 mg total) by mouth every evening.   cetirizine 10 MG tablet Commonly known as: ZYRTEC Take 10 mg by mouth daily.   [facility] Vitamin B-12 2000 MCG Tbcr Generic drug: Cyanocobalamin Take 2,000 mcg by mouth daily.   dexamethasone 4 MG tablet Commonly known as: DECADRON Take 1 tablet (4 mg total) by mouth 3 (three) times daily for 2 days.   diazepam 5 MG tablet Commonly known as: VALIUM Take 1 tablet (5 mg  total) by mouth every 12 (twelve) hours as needed for anxiety.   ezetimibe 10 MG tablet Commonly known as: ZETIA Take 1 tablet (10 mg total) by mouth daily.   isosorbide mononitrate 60 MG 24 hr tablet Commonly known as: IMDUR Take 1.5 tablets (90 mg total) by mouth daily.   meclizine 25 MG tablet Commonly known as: ANTIVERT TAKE 1 TABLET BY MOUTH THREE TIMES DAILY AS NEEDED FOR DIZZINESS   methocarbamol 500 MG tablet Commonly known as: ROBAXIN Take 500 mg by mouth every 6 (six) hours as needed for muscle spasms.   nitroGLYCERIN 0.4 MG SL tablet Commonly known as: Nitrostat Place 1 tablet (0.4 mg total) under the tongue every 5 (five) minutes as needed for chest pain (up to 3 doses).   ondansetron 4 MG tablet Commonly known as: Zofran Take 1 tablet (4 mg total) by mouth every 8 (eight) hours as needed for nausea or vomiting.   oxyCODONE 5 MG immediate release tablet Commonly known as: Oxy IR/ROXICODONE Take 1 tablet (5 mg total) by mouth every 3 (three) hours as needed for moderate pain (pain score 4-6).   pantoprazole 40 MG tablet Commonly known as: PROTONIX Take 1 tablet (40 mg total) by mouth 2 (two) times daily.   ranolazine 1000 MG SR tablet Commonly known as: Ranexa Take 1 tablet (1,000 mg total) by mouth 2 (two) times daily.   Repatha SureClick 140 MG/ML Soaj Generic drug: Evolocumab Inject 140 mg into the skin every 14 (fourteen) days.   vitamin D3 50 MCG (2000 UT) Caps Take 2,000 Units by mouth daily.  Follow-up Information     Coletta Memos, MD Follow up.   Specialty: Neurosurgery Why: keep your scheduled appointment Contact information: [address] [place] [address] [phone]                 Signed: Coletta Memos 04/01/2023, 1:44 PM

## 2023-04-02 ENCOUNTER — Encounter (HOSPITAL_COMMUNITY): Payer: Self-pay | Admitting: Neurosurgery

## 2023-04-08 ENCOUNTER — Encounter: Payer: Self-pay | Admitting: *Deleted

## 2023-04-22 DIAGNOSIS — M4722 Other spondylosis with radiculopathy, cervical region: Secondary | ICD-10-CM | POA: Diagnosis not present

## 2023-05-03 DIAGNOSIS — G8929 Other chronic pain: Secondary | ICD-10-CM | POA: Diagnosis not present

## 2023-05-03 DIAGNOSIS — M12811 Other specific arthropathies, not elsewhere classified, right shoulder: Secondary | ICD-10-CM | POA: Diagnosis not present

## 2023-05-03 DIAGNOSIS — M12812 Other specific arthropathies, not elsewhere classified, left shoulder: Secondary | ICD-10-CM | POA: Diagnosis not present

## 2023-07-06 DIAGNOSIS — M79671 Pain in right foot: Secondary | ICD-10-CM | POA: Diagnosis not present

## 2023-07-06 DIAGNOSIS — M19071 Primary osteoarthritis, right ankle and foot: Secondary | ICD-10-CM | POA: Diagnosis not present

## 2023-07-06 DIAGNOSIS — M2011 Hallux valgus (acquired), right foot: Secondary | ICD-10-CM | POA: Diagnosis not present

## 2023-07-06 DIAGNOSIS — M2021 Hallux rigidus, right foot: Secondary | ICD-10-CM | POA: Diagnosis not present

## 2023-07-26 ENCOUNTER — Ambulatory Visit: Admitting: Podiatry

## 2023-07-28 DIAGNOSIS — M12812 Other specific arthropathies, not elsewhere classified, left shoulder: Secondary | ICD-10-CM | POA: Diagnosis not present

## 2023-07-28 DIAGNOSIS — M12811 Other specific arthropathies, not elsewhere classified, right shoulder: Secondary | ICD-10-CM | POA: Diagnosis not present

## 2023-08-12 IMAGING — CT CT CHEST LUNG CANCER SCREENING LOW DOSE W/O CM
1 series · 10 of 10 positions shown, 13 images · non-contrast
Comparison: Chest CT 05/19/2019.

CLINICAL DATA: 65-year-old male former smoker (quit 1 year ago)
with 50 pack-year history of smoking. Lung cancer screening
examination.

EXAM:
CT CHEST WITHOUT CONTRAST LOW-DOSE FOR LUNG CANCER SCREENING
TECHNIQUE: Multidetector CT imaging of the chest was performed following the
standard protocol without IV contrast.

[ct lung segmentation data · axial · 0.71mm/px · z∈[+73,+73]mm · 10 of 298 frames shown]
[frame 1/298  mediastinal]
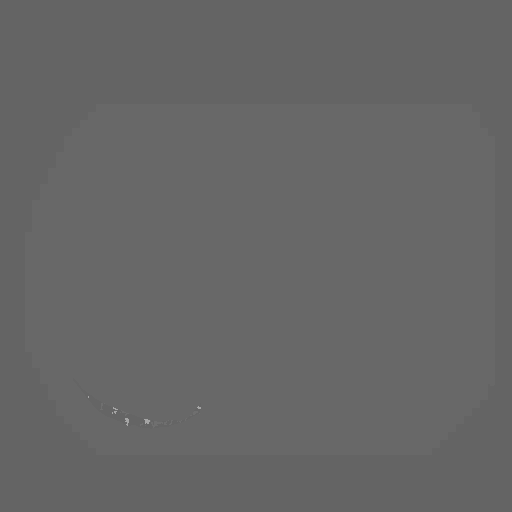
[frame 1/298  lung]
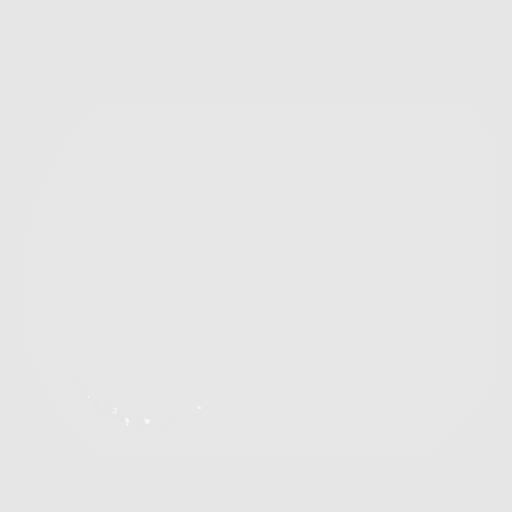
[frame 34/298  lung]
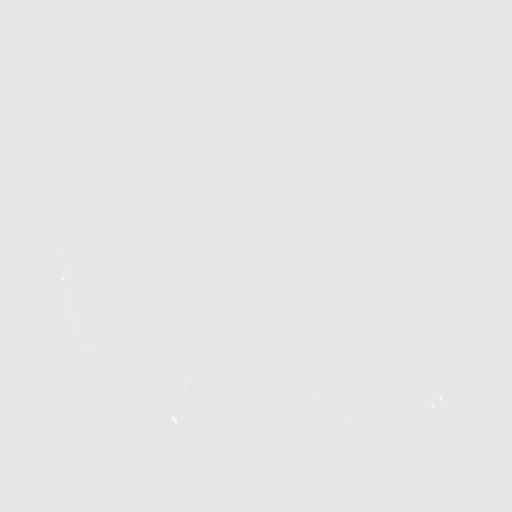
[frame 67/298  lung]
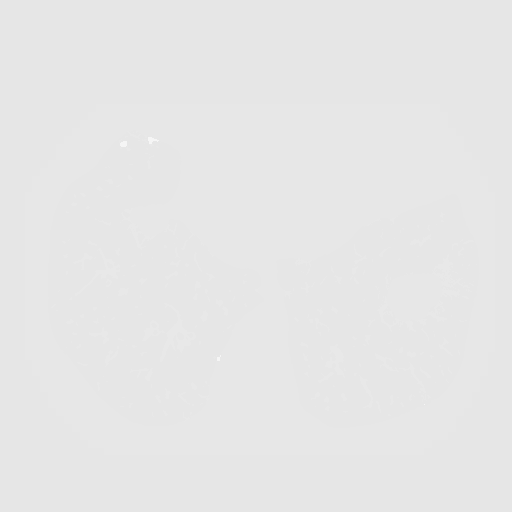
[frame 100/298  lung]
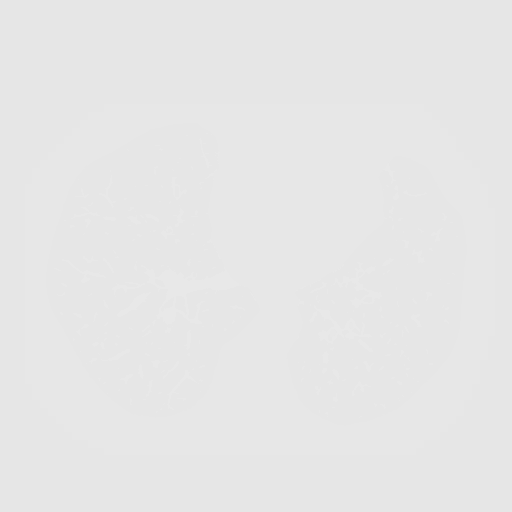
[frame 133/298  mediastinal]
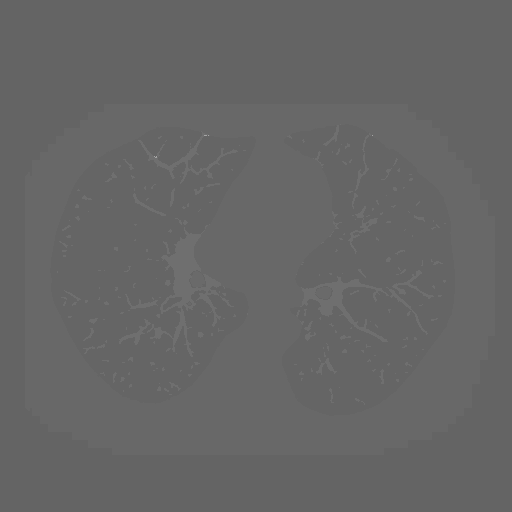
[frame 133/298  lung]
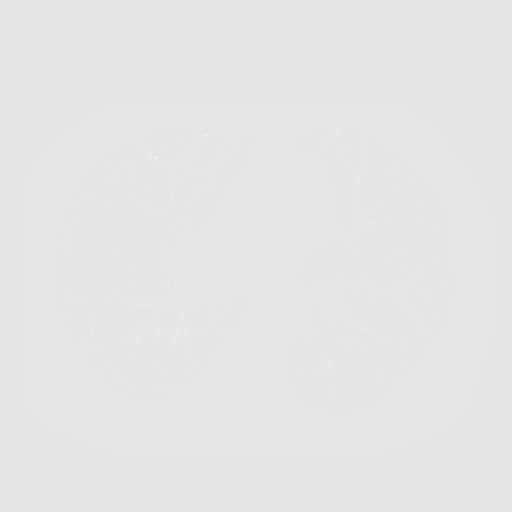
[frame 166/298  lung]
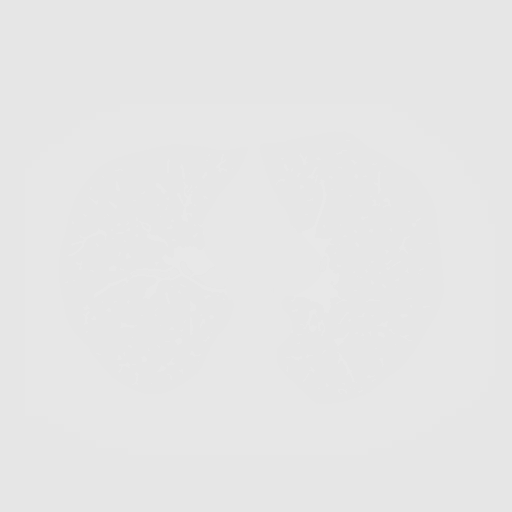
[frame 199/298  lung]
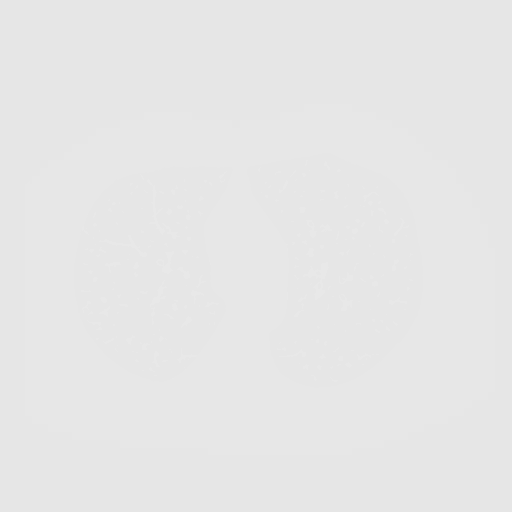
[frame 232/298  lung]
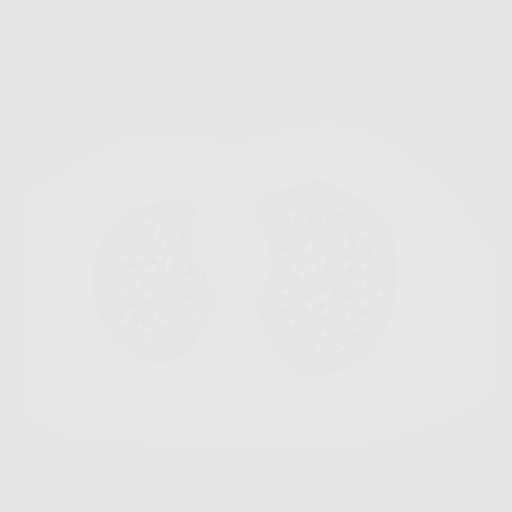
[frame 265/298  mediastinal]
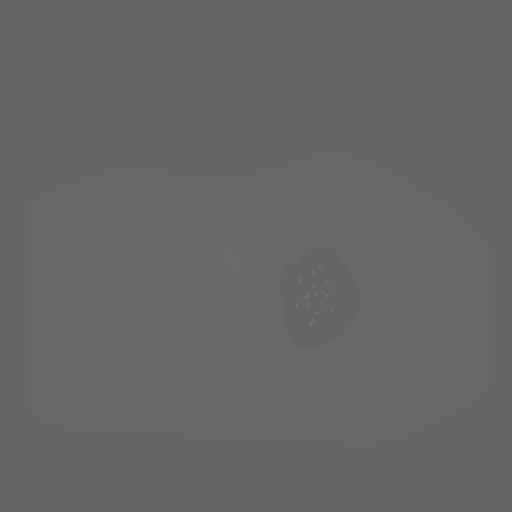
[frame 265/298  lung]
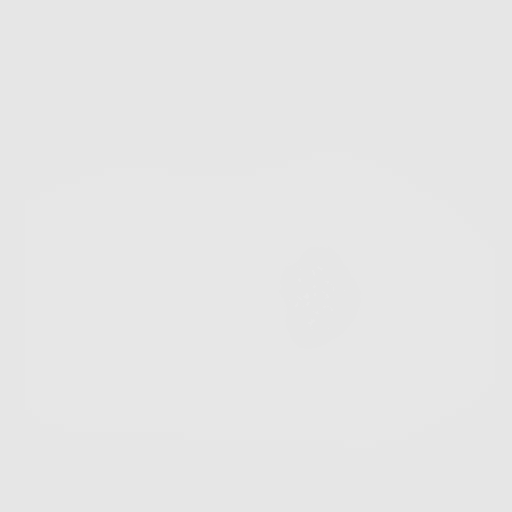
[frame 298/298  lung]
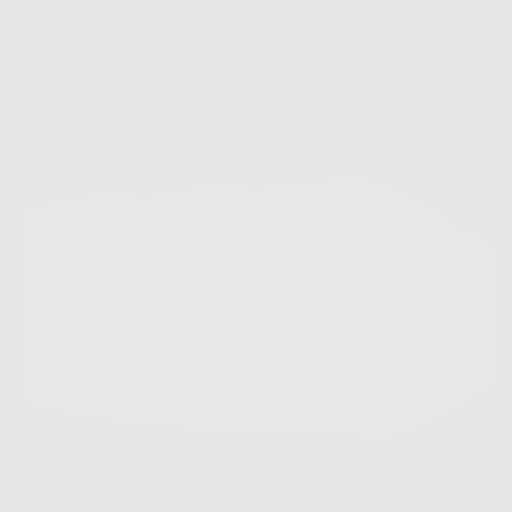

[10 of 10 positions shown; findings below may reference images not displayed]

FINDINGS: Cardiovascular: Heart size is normal. There is no significant
pericardial fluid, thickening or pericardial calcification. There is
aortic atherosclerosis, as well as atherosclerosis of the great
vessels of the mediastinum and the coronary arteries, including
calcified atherosclerotic plaque in the left main, left anterior
descending, left circumflex and right coronary arteries. Aberrant
origin of the left circumflex coronary artery from the right sinus
of Valsalva (normal anatomical variant) incidentally noted.

Mediastinum/Nodes: No pathologically enlarged mediastinal or hilar
lymph nodes. Please note that accurate exclusion of hilar adenopathy
is limited on noncontrast CT scans. Esophagus is unremarkable in
appearance. No axillary lymphadenopathy.

Lungs/Pleura: Multiple small pulmonary nodules are noted throughout
the lungs bilaterally, largest of which is in the right lower lobe
abutting the major fissure (axial image 221 of series 3), with a
volume derived mean diameter 5.6 mm. No larger more suspicious
appearing pulmonary nodules or masses are noted. No acute
consolidative airspace disease. No pleural effusions. Mild diffuse
bronchial wall thickening with mild centrilobular and paraseptal
emphysema.

Upper Abdomen: Unremarkable.

Musculoskeletal: There are no aggressive appearing lytic or blastic
lesions noted in the visualized portions of the skeleton.
IMPRESSION: 1. Lung-RADS 2S, benign appearance or behavior. Continue annual
screening with low-dose chest CT without contrast in 12 months.
2. The "S" modifier above refers to potentially clinically
significant non lung cancer related findings. Specifically, there is
aortic atherosclerosis, in addition to left main and 3 vessel
coronary artery disease. Please note that although the presence of
coronary artery calcium documents the presence of coronary artery
disease, the severity of this disease and any potential stenosis
cannot be assessed on this non-gated CT examination. Assessment for
potential risk factor modification, dietary therapy or pharmacologic
therapy may be warranted, if clinically indicated.
3. Mild diffuse bronchial wall thickening with mild centrilobular
and paraseptal emphysema; imaging findings suggestive of underlying
COPD.

Aortic Atherosclerosis (UNH3W-YRQ.Q) and Emphysema (UNH3W-TG5.F).

## 2023-08-20 DIAGNOSIS — R7303 Prediabetes: Secondary | ICD-10-CM | POA: Diagnosis not present

## 2023-08-20 DIAGNOSIS — G4700 Insomnia, unspecified: Secondary | ICD-10-CM | POA: Diagnosis not present

## 2023-08-20 DIAGNOSIS — E782 Mixed hyperlipidemia: Secondary | ICD-10-CM | POA: Diagnosis not present

## 2023-08-20 DIAGNOSIS — I10 Essential (primary) hypertension: Secondary | ICD-10-CM | POA: Diagnosis not present

## 2023-08-20 DIAGNOSIS — J449 Chronic obstructive pulmonary disease, unspecified: Secondary | ICD-10-CM | POA: Diagnosis not present

## 2023-08-20 DIAGNOSIS — I2089 Other forms of angina pectoris: Secondary | ICD-10-CM | POA: Diagnosis not present

## 2023-08-24 DIAGNOSIS — M4722 Other spondylosis with radiculopathy, cervical region: Secondary | ICD-10-CM | POA: Diagnosis not present

## 2023-08-31 ENCOUNTER — Other Ambulatory Visit: Payer: Self-pay

## 2023-08-31 DIAGNOSIS — E782 Mixed hyperlipidemia: Secondary | ICD-10-CM

## 2023-08-31 DIAGNOSIS — I2510 Atherosclerotic heart disease of native coronary artery without angina pectoris: Secondary | ICD-10-CM

## 2023-08-31 DIAGNOSIS — I25118 Atherosclerotic heart disease of native coronary artery with other forms of angina pectoris: Secondary | ICD-10-CM

## 2023-08-31 MED ORDER — NITROGLYCERIN 0.4 MG SL SUBL: 0.4000 mg | SUBLINGUAL_TABLET | SUBLINGUAL | 1 refills | Status: AC | PRN

## 2023-10-15 ENCOUNTER — Encounter (HOSPITAL_COMMUNITY): Payer: Self-pay | Admitting: Emergency Medicine

## 2023-10-15 ENCOUNTER — Emergency Department (HOSPITAL_COMMUNITY)

## 2023-10-15 ENCOUNTER — Emergency Department (HOSPITAL_COMMUNITY)
Admission: EM | Admit: 2023-10-15 | Discharge: 2023-10-15 | Disposition: A | Discharge status: Home / Self Care | Attending: Emergency Medicine | Admitting: Emergency Medicine

## 2023-10-15 ENCOUNTER — Other Ambulatory Visit: Payer: Self-pay

## 2023-10-15 DIAGNOSIS — R509 Fever, unspecified: Secondary | ICD-10-CM | POA: Diagnosis not present

## 2023-10-15 DIAGNOSIS — I2510 Atherosclerotic heart disease of native coronary artery without angina pectoris: Secondary | ICD-10-CM | POA: Diagnosis not present

## 2023-10-15 DIAGNOSIS — R079 Chest pain, unspecified: Secondary | ICD-10-CM | POA: Diagnosis not present

## 2023-10-15 DIAGNOSIS — R0789 Other chest pain: Secondary | ICD-10-CM | POA: Diagnosis not present

## 2023-10-15 DIAGNOSIS — Z7982 Long term (current) use of aspirin: Secondary | ICD-10-CM | POA: Diagnosis not present

## 2023-10-15 LAB — CBC
HCT: 38.2 % — ABNORMAL LOW (ref 39.0–52.0)
Hemoglobin: 12.6 g/dL — ABNORMAL LOW (ref 13.0–17.0)
MCH: 33.3 pg (ref 26.0–34.0)
MCHC: 33 g/dL (ref 30.0–36.0)
MCV: 101.1 fL — ABNORMAL HIGH (ref 80.0–100.0)
Platelets: 220 K/uL (ref 150–400)
RBC: 3.78 MIL/uL — ABNORMAL LOW (ref 4.22–5.81)
RDW: 14.5 % (ref 11.5–15.5)
WBC: 6.5 K/uL (ref 4.0–10.5)
nRBC: 0 % (ref 0.0–0.2)

## 2023-10-15 LAB — TROPONIN I (HIGH SENSITIVITY)
Troponin I (High Sensitivity): 6 ng/L (ref <18)
Troponin I (High Sensitivity): 6 ng/L (ref <18)

## 2023-10-15 LAB — BASIC METABOLIC PANEL WITH GFR
Anion gap: 11 (ref 5–15)
BUN: 6 mg/dL — ABNORMAL LOW (ref 8–23)
CO2: 24 mmol/L (ref 22–32)
Calcium: 8.5 mg/dL — ABNORMAL LOW (ref 8.9–10.3)
Chloride: 106 mmol/L (ref 98–111)
Creatinine, Ser: 0.67 mg/dL (ref 0.61–1.24)
GFR, Estimated: >60 mL/min (ref >60)
Glucose, Bld: 89 mg/dL (ref 70–99)
Potassium: 3.6 mmol/L (ref 3.5–5.1)
Sodium: 141 mmol/L (ref 135–145)

## 2023-10-15 NOTE — ED Provider Notes (Signed)
 [place] EM[facility]GENC[facility] DEPARTMENT AT [facility] Provider Note   CSN: [id number] Arrival date & time: 10/15/23  9779     Patient presents with: Chest Pain   Bill Johnson is a 68 y.o. male.   Patient is a 68 year old male with past medical history of coronary artery disease with prior stenting.  Patient presenting today with complaints of chest discomfort.  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  Later this evening, he began to develop discomfort in the center of his chest.  He describes the discomfort as sharp and occurs intermittently.  He has taken 4 nitroglycerin  at home with little relief.  He denies any shortness of breath, nausea, diaphoresis, or radiation to the arm or jaw.       Prior to Admission medications   Medication Sig Start Date End Date Taking? Authorizing Provider  acetaminophen  (TYLENOL ) 500 MG tablet Take 2 tablets (1,000 mg total) by mouth every 8 (eight) hours as needed for mild pain, headache, moderate pain or fever. 06/11/22   Leigh Valery RAMAN, PA-C  aspirin  EC 81 MG tablet Take 81 mg by mouth daily. Swallow whole.    [provider]  atorvastatin  (LIPITOR ) 80 MG tablet Take 1 tablet (80 mg total) by mouth every evening. 03/02/23   Pietro Redell RAMAN, MD  cetirizine (ZYRTEC) 10 MG tablet Take 10 mg by mouth daily.    [provider]  Cholecalciferol  (VITAMIN D3) 50 MCG (2000 UT) CAPS Take 2,000 Units by mouth daily.    [provider]  [facility] VITAMIN B-12 2000 MCG TBCR Take 2,000 mcg by mouth daily.    [provider]  diazepam  (VALIUM ) 5 MG tablet Take 1 tablet (5 mg total) by mouth every 12 (twelve) hours as needed for anxiety. 04/22/21   Tobie Suzzane POUR, MD  Evolocumab  (REPATHA  SURECLICK) 140 MG/ML SOAJ Inject 140 mg into the skin every 14 (fourteen) days. 03/02/23   Pietro Redell RAMAN, MD  ezetimibe  (ZETIA ) 10 MG tablet Take 1 tablet (10 mg total) by mouth daily. 03/02/23   Pietro Redell RAMAN, MD   isosorbide  mononitrate (IMDUR ) 60 MG 24 hr tablet Take 1.5 tablets (90 mg total) by mouth daily. 03/02/23 02/25/24  Pietro Redell RAMAN, MD  meclizine  (ANTIVERT ) 25 MG tablet TAKE 1 TABLET BY MOUTH THREE TIMES DAILY AS NEEDED FOR DIZZINESS 02/05/21   Tobie Suzzane POUR, MD  methocarbamol  (ROBAXIN ) 500 MG tablet Take 500 mg by mouth every 6 (six) hours as needed for muscle spasms. 06/10/22   [provider]  nitroGLYCERIN  (NITROSTAT ) 0.4 MG SL tablet Place 1 tablet (0.4 mg total) under the tongue every 5 (five) minutes as needed for chest pain (up to 3 doses). 08/31/23   Pietro Redell RAMAN, MD  oxyCODONE  (OXY IR/ROXICODONE ) 5 MG immediate release tablet Take 1 tablet (5 mg total) by mouth every 3 (three) hours as needed for moderate pain (pain score 4-6). 04/01/23   Gillie Duncans, MD  pantoprazole  (PROTONIX ) 40 MG tablet Take 1 tablet (40 mg total) by mouth 2 (two) times daily. 12/10/20   Tobie Suzzane POUR, MD  ranolazine  (RANEXA ) 1000 MG SR tablet Take 1 tablet (1,000 mg total) by mouth 2 (two) times daily. 03/02/23   Pietro Redell RAMAN, MD    Allergies: Patient has no known allergies.    Review of Systems  All other systems reviewed and are negative.   Updated Vital Signs BP (!) 146/69   Pulse 79   Temp 97.9  F (36.6 C) (Oral)   Resp 16   SpO2 95%   Physical Exam Vitals and nursing note reviewed.  Constitutional:      General: He is not in acute distress.    Appearance: He is well-developed. He is not diaphoretic.  HENT:     Head: Normocephalic and atraumatic.  Cardiovascular:     Rate and Rhythm: Normal rate and regular rhythm.     Heart sounds: No murmur heard.    No friction rub.  Pulmonary:     Effort: Pulmonary effort is normal. No respiratory distress.     Breath sounds: Normal breath sounds. No wheezing or rales.  Abdominal:     General: Bowel sounds are normal. There is no distension.     Palpations: Abdomen is soft.     Tenderness: There is no abdominal tenderness.   Musculoskeletal:        General: Normal range of motion.     Cervical back: Normal range of motion and neck supple.  Skin:    General: Skin is warm and dry.  Neurological:     Mental Status: He is alert and oriented to person, place, and time.     Coordination: Coordination normal.     (all labs ordered are listed, but only abnormal results are displayed) Labs Reviewed  CBC - Abnormal; Notable for the following components:      Result Value   RBC 3.78 (*)    Hemoglobin 12.6 (*)    HCT 38.2 (*)    MCV 101.1 (*)    All other components within normal limits  BASIC METABOLIC PANEL WITH GFR  TROPONIN I (HIGH SENSITIVITY)    EKG: EKG Interpretation Date/Time:  Friday October 15 2023 02:31:23 EDT Ventricular Rate:  78 PR Interval:  164 QRS Duration:  95 QT Interval:  402 QTC Calculation: 458 R Axis:   77  Text Interpretation: Sinus rhythm Consider left atrial enlargement No significant change since 03/02/2023 Confirmed by Geroldine Berg ([id number]) on 10/15/2023 2:34:06 AM  Radiology: No results found.   Procedures   Medications Ordered in the ED - No data to display                                  Medical Decision Making Amount and/or Complexity of Data Reviewed Labs: ordered. Radiology: ordered.   Patient is a 68 year old male presenting with complaints of chest discomfort as described in the HPI.  He has a history of coronary artery disease and prior stenting.  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  Patient arrives here with stable vital signs.  Physical examination is unremarkable.  Laboratory studies obtained including CBC, basic metabolic panel, and troponin x 2, all of which are unremarkable.  Chest x-ray showing no acute process.  At this point, I am seeing no evidence of a cardiac etiology.  His troponin is 6 initially and 6 upon repeat with no ongoing chest pain.  I doubt a cardiac etiology and feel as though he can  safely be discharged.  I will have him follow-up with his cardiologist and return as needed.     Final diagnoses:  None    ED Discharge Orders     None          Geroldine Berg, MD 10/15/23 [id number]

## 2023-10-15 NOTE — Discharge Instructions (Signed)
 Continue medications as previously prescribed.  Follow-up with cardiology and return to the ER if symptoms worsen or change.

## 2023-10-15 NOTE — ED Notes (Signed)
 ED Provider at bedside.

## 2023-10-15 NOTE — ED Triage Notes (Signed)
 Pt arrives via EMS from home with c/o intermittent CP since 1530 this afternoon. Pt states he had an active day working outside and reports taking 4 SL nitroglycerin  with minimal relief. Denies SHOB. Hx 3 stents placed in 2021. 324mg  asa given en route.

## 2023-12-06 NOTE — Progress Notes (Deleted)
 HPI: FU hx of HTN, tobacco and alcohol  abuse, CAD s/p DESx2 to o-mLAD 07/05/19 for CP and +CCTA, then DES to 95% OM1 stenosis 07/20/19 for residual symptoms.  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   Repeat cath on 01/02/2021 patent LAD and OM stents, moderate D1 disease, moderate PDA disease which was overall stable. Recommended for continued medical management. Echo 3/24 showed normal LV function; mild AI; trace MR. Abdominal ultrasound February 2025 showed no aneurysm.  Seen in the emergency room with atypical chest pain September 2025.  Chest x-ray without infiltrates and troponins normal.   Since last seen,   Current Outpatient Medications  Medication Sig Dispense Refill   acetaminophen  (TYLENOL ) 500 MG tablet Take 2 tablets (1,000 mg total) by mouth every 8 (eight) hours as needed for mild pain, headache, moderate pain or fever. 30 tablet 0   aspirin  EC 81 MG tablet Take 81 mg by mouth daily. Swallow whole.     atorvastatin  (LIPITOR ) 80 MG tablet Take 1 tablet (80 mg total) by mouth every evening. 90 tablet 3   cetirizine (ZYRTEC) 10 MG tablet Take 10 mg by mouth daily.     Cholecalciferol  (VITAMIN D3) 50 MCG (2000 UT) CAPS Take 2,000 Units by mouth daily.     [employer] VITAMIN B-12 2000 MCG TBCR Take 2,000 mcg by mouth daily.     diazepam  (VALIUM ) 5 MG tablet Take 1 tablet (5 mg total) by mouth every 12 (twelve) hours as needed for anxiety. 45 tablet 2   Evolocumab  (REPATHA  SURECLICK) 140 MG/ML SOAJ Inject 140 mg into the skin every 14 (fourteen) days. 6 mL 3   ezetimibe  (ZETIA ) 10 MG tablet Take 1 tablet (10 mg total) by mouth daily. 90 tablet 3   isosorbide  mononitrate (IMDUR ) 60 MG 24 hr tablet Take 1.5 tablets (90 mg total) by mouth daily. 135 tablet 3   meclizine  (ANTIVERT ) 25 MG tablet TAKE 1 TABLET BY MOUTH THREE TIMES DAILY AS NEEDED FOR DIZZINESS 90 tablet 2   methocarbamol  (ROBAXIN ) 500 MG tablet Take 500 mg by mouth every 6 (six) hours  as needed for muscle spasms.     nitroGLYCERIN  (NITROSTAT ) 0.4 MG SL tablet Place 1 tablet (0.4 mg total) under the tongue every 5 (five) minutes as needed for chest pain (up to 3 doses). 75 tablet 1   oxyCODONE  (OXY IR/ROXICODONE ) 5 MG immediate release tablet Take 1 tablet (5 mg total) by mouth every 3 (three) hours as needed for moderate pain (pain score 4-6). 30 tablet 0   pantoprazole  (PROTONIX ) 40 MG tablet Take 1 tablet (40 mg total) by mouth 2 (two) times daily. 180 tablet 2   ranolazine  (RANEXA ) 1000 MG SR tablet Take 1 tablet (1,000 mg total) by mouth 2 (two) times daily. 180 tablet 3   No current facility-administered medications for this visit.     Past Medical History:  Diagnosis Date   Alcohol  abuse    pt states he no longer drinks. He said he last drank about 2-3 years ago (03/26/23)   Allergy    seasonal   Anginal pain    Anxiety    CAD (coronary artery disease)    a. 06/2019 cath - s/p successful PCI of the ostial to mid LAD with IVUS guidance and DES x 2.   COPD (chronic obstructive pulmonary disease) (HCC)    pt denies   Essential hypertension    GERD (gastroesophageal reflux disease)  HOH (hard of hearing)    Hyperlipidemia    Hypertension    OA (osteoarthritis) of knee    Pulmonary nodules    a. seen on coronary CT 05/2019, will need OP f/u.   Rotator cuff tear arthropathy of both shoulders    On DISABILITY, non operative case   Shoulder pain    Tobacco abuse    Vertigo     Past Surgical History:  Procedure Laterality Date   ANTERIOR CERVICAL DECOMP/DISCECTOMY FUSION N/A 03/31/2023   Procedure: CERVICAL FIVE AND SIX CORPECTOMY WITH PLATING;  Surgeon: Gillie Duncans, MD;  Location: [facility] OR;  Service: Neurosurgery;  Laterality: N/A;  C3   CARDIAC CATHETERIZATION  06/2005   no significant CAD   CARPAL TUNNEL RELEASE Left 02/10/2023   CATARACT EXTRACTION Left    COLONOSCOPY WITH PROPOFOL  N/A 05/01/2019   Procedure: COLONOSCOPY WITH PROPOFOL ;  Surgeon: Shaaron Lamar HERO, MD;  Location: [facility];  Service: Endoscopy;  Laterality: N/A;  7:30am   COLONOSCOPY WITH PROPOFOL  N/A 03/07/2020   Procedure: COLONOSCOPY WITH PROPOFOL ;  Surgeon: Shaaron Lamar HERO, MD;  Location: [facility];  Service: Endoscopy;  Laterality: N/A;  8:30am   CORONARY PRESSURE/FFR STUDY N/A 07/05/2019   Procedure: INTRAVASCULAR PRESSURE WIRE/FFR STUDY;  Surgeon: Jordan, Peter M, MD;  Location: [facility] INVASIVE CV LAB;  Service: Cardiovascular;  Laterality: N/A;   CORONARY STENT INTERVENTION N/A 07/05/2019   Procedure: CORONARY STENT INTERVENTION;  Surgeon: Jordan, Peter M, MD;  Location: [facility] INVASIVE CV LAB;  Service: Cardiovascular;  Laterality: N/A;   CORONARY STENT INTERVENTION N/A 07/20/2019   Procedure: CORONARY STENT INTERVENTION;  Surgeon: Dann Candyce RAMAN, MD;  Location: [facility] INVASIVE CV LAB;  Service: Cardiovascular;  Laterality: N/A;   CORONARY ULTRASOUND/IVUS N/A 07/05/2019   Procedure: Intravascular Ultrasound/IVUS;  Surgeon: Jordan, Peter M, MD;  Location: [facility] INVASIVE CV LAB;  Service: Cardiovascular;  Laterality: N/A;   ESOPHAGOGASTRODUODENOSCOPY  08/2005   Dr. Abran: small hiatal hernia   HERNIA REPAIR  1983   KNEE ARTHROPLASTY Right 06/10/2022   Procedure: COMPUTER ASSISTED TOTAL KNEE ARTHROPLASTY;  Surgeon: Fidel Rogue, MD;  Location: [facility];  Service: Orthopedics;  Laterality: Right;  150   LEFT HEART CATH AND CORONARY ANGIOGRAPHY N/A 07/05/2019   Procedure: LEFT HEART CATH AND CORONARY ANGIOGRAPHY;  Surgeon: Jordan, Peter M, MD;  Location: [facility] INVASIVE CV LAB;  Service: Cardiovascular;  Laterality: N/A;   LEFT HEART CATH AND CORONARY ANGIOGRAPHY N/A 07/20/2019   Procedure: LEFT HEART CATH AND CORONARY ANGIOGRAPHY;  Surgeon: Dann Candyce RAMAN, MD;  Location: [facility] INVASIVE CV LAB;  Service: Cardiovascular;  Laterality: N/A;   LEFT HEART CATH AND CORONARY ANGIOGRAPHY N/A 04/24/2020   Procedure: LEFT HEART CATH AND CORONARY ANGIOGRAPHY;  Surgeon: Claudene Victory ORN,  MD;  Location: [facility] INVASIVE CV LAB;  Service: Cardiovascular;  Laterality: N/A;   LEFT HEART CATH AND CORONARY ANGIOGRAPHY N/A 01/02/2021   Procedure: LEFT HEART CATH AND CORONARY ANGIOGRAPHY;  Surgeon: Verlin Lonni BIRCH, MD;  Location: [facility] INVASIVE CV LAB;  Service: Cardiovascular;  Laterality: N/A;   MULTIPLE TOOTH EXTRACTIONS  01/2022   POLYPECTOMY  05/01/2019   Procedure: POLYPECTOMY;  Surgeon: Shaaron Lamar HERO, MD;  Location: [facility];  Service: Endoscopy;;   POLYPECTOMY  03/07/2020   Procedure: POLYPECTOMY;  Surgeon: Shaaron Lamar HERO, MD;  Location: [facility];  Service: Endoscopy;;   TONSILLECTOMY  1977    Social History   Socioeconomic History   Marital status: Divorced    Spouse name:  Not on file   Number of children: 2   Years of education: 12   Highest education level: Not on file  Occupational History   Occupation: Self-employed office manager)  Tobacco Use   Smoking status: Former    Current packs/day: 0.00    Average packs/day: 1 pack/day for 50.3 years (50.3 ttl pk-yrs)    Types: Cigarettes    Start date: 04/15/1970    Quit date: 07/26/2020    Years since quitting: 3.3   Smokeless tobacco: Never  Vaping Use   Vaping status: Never Used  Substance and Sexual Activity   Alcohol  use: Not Currently    Comment: pt states he last drank 2-3 years ago (03/26/23)   Drug use: No   Sexual activity: Yes  Other Topics Concern   Not on file  Social History Narrative   Lives at home with nephew and niece in-law   Caffeine use: Drinks coffee/soda every day   Social Drivers of Corporate Investment Banker Strain: Low Risk  (08/19/2023)   Received from [facility]   Overall Financial Resource Strain (CARDIA)    How hard is it for you to pay for the very basics like food, housing, medical care, and heating?: Not very hard  Food Insecurity: No Food Insecurity (08/19/2023)   Received from [facility]   Hunger Vital Sign    Within the past 12 months, you worried that  your food would run out before you got the money to buy more.: Never true    Within the past 12 months, the food you bought just didn't last and you didn't have money to get more.: Never true  Transportation Needs: No Transportation Needs (08/19/2023)   Received from [facility] - Transportation    In the past 12 months, has lack of transportation kept you from medical appointments or from getting medications?: No    In the past 12 months, has lack of transportation kept you from meetings, work, or from getting things needed for daily living?: No  Physical Activity: Insufficiently Active (08/19/2023)   Received from [facility]   Exercise Vital Sign    On average, how many days per week do you engage in moderate to strenuous exercise (like a brisk walk)?: 2 days    On average, how many minutes do you engage in exercise at this level?: 60 min  Stress: Stress Concern Present (08/19/2023)   Received from [facility] - Occupational Stress Questionnaire    Do you feel stress - tense, restless, nervous, or anxious, or unable to sleep at night because your mind is troubled all the time - these days?: To some extent  Social Connections: Somewhat Isolated (08/19/2023)   Received from [facility]   Social Network    How would you rate your social network (family, work, friends)?: Restricted participation with some degree of social isolation  Intimate Partner Violence: Patient Declined (08/19/2023)   Received from [facility]   HITS    Over the last 12 months how often did your partner physically hurt you?: Patient declined    Over the last 12 months how often did your partner insult you or talk down to you?: Patient declined    Over the last 12 months how often did your partner threaten you with physical harm?: Patient declined    Over the last 12 months how often did your partner scream or curse at you?: Patient declined  Family History   Problem Relation Age of Onset   Heart disease Mother    Diabetes Mother    Hypertension Mother    Heart disease Father    Hypertension Father    Stroke Other    Cancer Other    Cancer Brother        prostate   Colon cancer Neg Hx     ROS: no fevers or chills, productive cough, hemoptysis, dysphasia, odynophagia, melena, hematochezia, dysuria, hematuria, rash, seizure activity, orthopnea, PND, pedal edema, claudication. Remaining systems are negative.  Physical Exam: Well-developed well-nourished in no acute distress.  Skin is warm and dry.  HEENT is normal.  Neck is supple.  Chest is clear to auscultation with normal expansion.  Cardiovascular exam is regular rate and rhythm.  Abdominal exam nontender or distended. No masses palpated. Extremities show no edema. neuro grossly intact  ECG- personally reviewed  A/P  1 coronary artery disease-patient has a history of PCI of the LAD and OM.  He has occasional atypical chest pain which is chronic.  Continue aspirin  and statin.  Continue isosorbide  and Ranexa .  2 hypertension-blood pressure controlled.  Continue present medications.  3 hyperlipidemia-continue Repatha , Lipitor  and Zetia .  Redell Shallow, MD

## 2023-12-09 ENCOUNTER — Ambulatory Visit: Admitting: Cardiology

## 2024-02-23 NOTE — Progress Notes (Signed)
 "    HPI: FU hx of HTN, tobacco and alcohol  abuse, CAD s/p DESx2 to o-mLAD 07/05/19 for CP and +CCTA, then DES to 95% OM1 stenosis 07/20/19 for residual symptoms.  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   Repeat cath on 01/02/2021 patent LAD and OM stents, moderate D1 disease, moderate PDA disease which was overall stable. Recommended for continued medical management. Echo 3/24 showed normal LV function; mild AI; trace MR. Abdominal ultrasound February 2025 showed no aneurysm.  Seen in the emergency room September 2025 with atypical chest pain.  Troponins were normal.   Since last seen, patient notes some increased dyspnea.  He states he has chest pain that he takes nitroglycerin  for.  General fatigue.  Current Outpatient Medications  Medication Sig Dispense Refill   acetaminophen  (TYLENOL ) 500 MG tablet Take 2 tablets (1,000 mg total) by mouth every 8 (eight) hours as needed for mild pain, headache, moderate pain or fever. 30 tablet 0   aspirin  EC 81 MG tablet Take 81 mg by mouth daily. Swallow whole.     cetirizine (ZYRTEC) 10 MG tablet Take 10 mg by mouth daily.     Cholecalciferol  (VITAMIN D3) 50 MCG (2000 UT) CAPS Take 2,000 Units by mouth daily.     [employer]S VITAMIN B-12 2000 MCG TBCR Take 2,000 mcg by mouth daily.     diazepam  (VALIUM ) 5 MG tablet Take 1 tablet (5 mg total) by mouth every 12 (twelve) hours as needed for anxiety. 45 tablet 2   Evolocumab  (REPATHA  SURECLICK) 140 MG/ML SOAJ Inject 140 mg into the skin every 14 (fourteen) days. 6 mL 3   ezetimibe  (ZETIA ) 10 MG tablet Take 1 tablet (10 mg total) by mouth daily. 90 tablet 3   famotidine  (PEPCID ) 20 MG tablet Take 20 mg by mouth.     isosorbide  mononitrate (IMDUR ) 60 MG 24 hr tablet Take 1.5 tablets (90 mg total) by mouth daily. 135 tablet 3   meclizine  (ANTIVERT ) 25 MG tablet TAKE 1 TABLET BY MOUTH THREE TIMES DAILY AS NEEDED FOR DIZZINESS 90 tablet 2   methocarbamol  (ROBAXIN ) 500 MG tablet  Take 500 mg by mouth every 6 (six) hours as needed for muscle spasms.     nitroGLYCERIN  (NITROSTAT ) 0.4 MG SL tablet Place 1 tablet (0.4 mg total) under the tongue every 5 (five) minutes as needed for chest pain (up to 3 doses). 75 tablet 1   pantoprazole  (PROTONIX ) 40 MG tablet Take 1 tablet (40 mg total) by mouth 2 (two) times daily. 180 tablet 2   ranolazine  (RANEXA ) 1000 MG SR tablet Take 1 tablet (1,000 mg total) by mouth 2 (two) times daily. 180 tablet 3   atorvastatin  (LIPITOR ) 80 MG tablet Take 1 tablet (80 mg total) by mouth every evening. (Patient not taking: Reported on 03/02/2024) 90 tablet 3   oxyCODONE  (OXY IR/ROXICODONE ) 5 MG immediate release tablet Take 1 tablet (5 mg total) by mouth every 3 (three) hours as needed for moderate pain (pain score 4-6). (Patient not taking: Reported on 03/02/2024) 30 tablet 0   No current facility-administered medications for this visit.     Past Medical History:  Diagnosis Date   Alcohol  abuse    pt states he no longer drinks. He said he last drank about 2-3 years ago (03/26/23)   Allergy    seasonal   Anginal pain    Anxiety    CAD (coronary artery disease)    a. 06/2019 cath -  s/p successful PCI of the ostial to mid LAD with IVUS guidance and DES x 2.   COPD (chronic obstructive pulmonary disease) (HCC)    pt denies   Essential hypertension    GERD (gastroesophageal reflux disease)    HOH (hard of hearing)    Hyperlipidemia    Hypertension    OA (osteoarthritis) of knee    Pulmonary nodules    a. seen on coronary CT 05/2019, will need OP f/u.   Rotator cuff tear arthropathy of both shoulders    On DISABILITY, non operative case   Shoulder pain    Tobacco abuse    Vertigo     Past Surgical History:  Procedure Laterality Date   ANTERIOR CERVICAL DECOMP/DISCECTOMY FUSION N/A 03/31/2023   Procedure: CERVICAL FIVE AND SIX CORPECTOMY WITH PLATING;  Surgeon: Gillie Duncans, MD;  Location: [facility] OR;  Service: Neurosurgery;  Laterality: N/A;  C3    CARDIAC CATHETERIZATION  06/2005   no significant CAD   CARPAL TUNNEL RELEASE Left 02/10/2023   CATARACT EXTRACTION Left    COLONOSCOPY WITH PROPOFOL  N/A 05/01/2019   Procedure: COLONOSCOPY WITH PROPOFOL ;  Surgeon: Shaaron Lamar HERO, MD;  Location: [facility];  Service: Endoscopy;  Laterality: N/A;  7:30am   COLONOSCOPY WITH PROPOFOL  N/A 03/07/2020   Procedure: COLONOSCOPY WITH PROPOFOL ;  Surgeon: Shaaron Lamar HERO, MD;  Location: [facility];  Service: Endoscopy;  Laterality: N/A;  8:30am   CORONARY PRESSURE/FFR STUDY N/A 07/05/2019   Procedure: INTRAVASCULAR PRESSURE WIRE/FFR STUDY;  Surgeon: Jordan, Peter M, MD;  Location: [facility] INVASIVE CV LAB;  Service: Cardiovascular;  Laterality: N/A;   CORONARY STENT INTERVENTION N/A 07/05/2019   Procedure: CORONARY STENT INTERVENTION;  Surgeon: Jordan, Peter M, MD;  Location: [facility] INVASIVE CV LAB;  Service: Cardiovascular;  Laterality: N/A;   CORONARY STENT INTERVENTION N/A 07/20/2019   Procedure: CORONARY STENT INTERVENTION;  Surgeon: Dann Candyce RAMAN, MD;  Location: [facility] INVASIVE CV LAB;  Service: Cardiovascular;  Laterality: N/A;   CORONARY ULTRASOUND/IVUS N/A 07/05/2019   Procedure: Intravascular Ultrasound/IVUS;  Surgeon: Jordan, Peter M, MD;  Location: [facility] INVASIVE CV LAB;  Service: Cardiovascular;  Laterality: N/A;   ESOPHAGOGASTRODUODENOSCOPY  08/2005   Dr. Abran: small hiatal hernia   HERNIA REPAIR  1983   KNEE ARTHROPLASTY Right 06/10/2022   Procedure: COMPUTER ASSISTED TOTAL KNEE ARTHROPLASTY;  Surgeon: Fidel Rogue, MD;  Location: [facility] ORS;  Service: Orthopedics;  Laterality: Right;  150   LEFT HEART CATH AND CORONARY ANGIOGRAPHY N/A 07/05/2019   Procedure: LEFT HEART CATH AND CORONARY ANGIOGRAPHY;  Surgeon: Jordan, Peter M, MD;  Location: [facility] INVASIVE CV LAB;  Service: Cardiovascular;  Laterality: N/A;   LEFT HEART CATH AND CORONARY ANGIOGRAPHY N/A 07/20/2019   Procedure: LEFT HEART CATH AND CORONARY ANGIOGRAPHY;  Surgeon: Dann Candyce RAMAN, MD;  Location: [facility] INVASIVE CV LAB;  Service: Cardiovascular;  Laterality: N/A;   LEFT HEART CATH AND CORONARY ANGIOGRAPHY N/A 04/24/2020   Procedure: LEFT HEART CATH AND CORONARY ANGIOGRAPHY;  Surgeon: Claudene Victory ORN, MD;  Location: [facility] INVASIVE CV LAB;  Service: Cardiovascular;  Laterality: N/A;   LEFT HEART CATH AND CORONARY ANGIOGRAPHY N/A 01/02/2021   Procedure: LEFT HEART CATH AND CORONARY ANGIOGRAPHY;  Surgeon: Verlin Lonni BIRCH, MD;  Location: [facility] INVASIVE CV LAB;  Service: Cardiovascular;  Laterality: N/A;   MULTIPLE TOOTH EXTRACTIONS  01/2022   POLYPECTOMY  05/01/2019   Procedure: POLYPECTOMY;  Surgeon: Shaaron Lamar HERO, MD;  Location: [facility];  Service: Endoscopy;;   POLYPECTOMY  03/07/2020   Procedure: POLYPECTOMY;  Surgeon: Shaaron Lamar HERO, MD;  Location: [facility];  Service: Endoscopy;;   TONSILLECTOMY  1977    Social History   Socioeconomic History   Marital status: Divorced    Spouse name: Not on file   Number of children: 2   Years of education: 12   Highest education level: Not on file  Occupational History   Occupation: Self-employed office manager)  Tobacco Use   Smoking status: Former    Current packs/day: 0.00    Average packs/day: 1 pack/day for 50.3 years (50.3 ttl pk-yrs)    Types: Cigarettes    Start date: 04/15/1970    Quit date: 07/26/2020    Years since quitting: 3.6   Smokeless tobacco: Never  Vaping Use   Vaping status: Never Used  Substance and Sexual Activity   Alcohol  use: Not Currently    Comment: pt states he last drank 2-3 years ago (03/26/23)   Drug use: No   Sexual activity: Yes  Other Topics Concern   Not on file  Social History Narrative   Lives at home with nephew and niece in-law   Caffeine use: Drinks coffee/soda every day   Social Drivers of Health   Tobacco Use: Medium Risk (03/02/2024)   Patient History    Smoking Tobacco Use: Former    Smokeless Tobacco Use: Never    Passive Exposure: Not on file   Financial Resource Strain: High Risk (02/24/2024)   Received from [facility]   Overall Financial Resource Strain (CARDIA)    How hard is it for you to pay for the very basics like food, housing, medical care, and heating?: Very hard  Food Insecurity: No Food Insecurity (02/24/2024)   Received from [facility]   Epic    Within the past 12 months, you worried that your food would run out before you got the money to buy more.: Never true    Within the past 12 months, the food you bought just didn't last and you didn't have money to get more.: Never true  Transportation Needs: No Transportation Needs (02/24/2024)   Received from [facility]    In the past 12 months, has lack of transportation kept you from medical appointments or from getting medications?: No    In the past 12 months, has lack of transportation kept you from meetings, work, or from getting things needed for daily living?: No  Physical Activity: Unknown (02/24/2024)   Received from [facility]   Exercise Vital Sign    On average, how many days per week do you engage in moderate to strenuous exercise (like a brisk walk)?: 5 days    Minutes of Exercise per Session: Not on file  Stress: No Stress Concern Present (02/24/2024)   Received from [facility] - Occupational Stress Questionnaire    Do you feel stress - tense, restless, nervous, or anxious, or unable to sleep at night because your mind is troubled all the time - these days?: Not at all  Social Connections: Socially Integrated (02/24/2024)   Received from [facility]   Social Network    How would you rate your social network (family, work, friends)?: Good participation with social networks  Intimate Partner Violence: Not At Risk (02/24/2024)   Received from [facility]   HITS    Over the last 12 months how often did your partner physically hurt you?: Never    Over the  last 12 months how often did your partner  insult you or talk down to you?: Never    Over the last 12 months how often did your partner threaten you with physical harm?: Never    Over the last 12 months how often did your partner scream or curse at you?: Never  Depression (PHQ2-9): Low Risk (04/22/2021)   Depression (PHQ2-9)    PHQ-2 Score: 0  Alcohol  Screen: Not on file  Housing: Low Risk (02/24/2024)   Received from [facility]    In the last 12 months, was there a time when you were not able to pay the mortgage or rent on time?: No    In the past 12 months, how many times have you moved where you were living?: 0    At any time in the past 12 months, were you homeless or living in a shelter (including now)?: No  Utilities: Not At Risk (02/24/2024)   Received from [facility]    In the past 12 months has the electric, gas, oil, or water  company threatened to shut off services in your home?: No  Health Literacy: Not on file    Family History  Problem Relation Age of Onset   Heart disease Mother    Diabetes Mother    Hypertension Mother    Heart disease Father    Hypertension Father    Stroke Other    Cancer Other    Cancer Brother        prostate   Colon cancer Neg Hx     ROS: no fevers or chills, productive cough, hemoptysis, dysphasia, odynophagia, melena, hematochezia, dysuria, hematuria, rash, seizure activity, orthopnea, PND, pedal edema, claudication. Remaining systems are negative.  Physical Exam: Well-developed well-nourished in no acute distress.  Skin is warm and dry.  HEENT is normal.  Neck is supple.  Chest is clear to auscultation with normal expansion.  Cardiovascular exam is regular rate and rhythm.  Abdominal exam nontender or distended. No masses palpated. Extremities show no edema. neuro grossly intact  EKG Interpretation Date/Time:  Thursday March 02 2024 15:55:45 EST Ventricular Rate:  67 PR Interval:  150 QRS Duration:  94 QT Interval:  406 QTC Calculation: 429 R  Axis:   35  Text Interpretation: Normal sinus rhythm Normal ECG When compared with ECG of 15-Oct-2023 02:31, PREVIOUS ECG IS PRESENT Confirmed by Pietro Rogue ([id number]) on 03/02/2024 4:04:39 PM    A/P  1 coronary artery disease-patient with history of PCI of the LAD and obtuse marginal.  He has had difficulties with occasional chest pain in the past.  Most recent catheterization revealed moderate coronary disease.  Continue aspirin  and statin.  Continue Imdur  and Ranexa .  He is having continuing chest pain.  Will arrange stress PET to screen for ischemia.  2 hyperlipidemia-continue Zetia , Lipitor  and Repatha .  Check lipids and liver.  3 hypertension-blood pressure controlled.  Continue present medications.  4 tobacco abuse-patient discontinued.  Rogue Pietro, MD    "

## 2024-03-02 ENCOUNTER — Encounter: Payer: Self-pay | Admitting: Cardiology

## 2024-03-02 ENCOUNTER — Ambulatory Visit: Admitting: Cardiology

## 2024-03-02 VITALS — BP 114/50 | HR 79 | Ht 66.0 in | Wt 160.0 lb

## 2024-03-02 DIAGNOSIS — R072 Precordial pain: Secondary | ICD-10-CM

## 2024-03-02 DIAGNOSIS — I2510 Atherosclerotic heart disease of native coronary artery without angina pectoris: Secondary | ICD-10-CM | POA: Diagnosis not present

## 2024-03-02 DIAGNOSIS — E782 Mixed hyperlipidemia: Secondary | ICD-10-CM | POA: Diagnosis not present

## 2024-03-02 DIAGNOSIS — I25118 Atherosclerotic heart disease of native coronary artery with other forms of angina pectoris: Secondary | ICD-10-CM

## 2024-03-02 MED ORDER — REPATHA SURECLICK 140 MG/ML ~~LOC~~ SOAJ: 140.0000 mg | SUBCUTANEOUS | 3 refills | Status: AC

## 2024-03-02 MED ORDER — RANOLAZINE ER 1000 MG PO TB12: 1000.0000 mg | ORAL_TABLET | Freq: Two times a day (BID) | ORAL | 3 refills | Status: AC

## 2024-03-02 NOTE — Patient Instructions (Addendum)
" ° °  Testing/Procedures    Please report to Radiology at the [facility] Main Entrance 30 minutes early for your test.  [address] [place], [place] [address]   How to Prepare for Your Cardiac PET/CT Stress Test:  Nothing to eat or drink, except water , 3 hours prior to arrival time.  NO caffeine/decaffeinated products, or chocolate 12 hours prior to arrival. (Please note decaffeinated beverages (teas/coffees) still contain caffeine).  If you have caffeine within 12 hours prior, the test will need to be rescheduled.  Medication instructions: Do not take erectile dysfunction medications for 72 hours prior to test (sildenafil, tadalafil) Do not take nitrates (isosorbide  mononitrate, Ranexa ) the day before or day of test  You may take your remaining medications with water .  NO perfume, cologne or lotion on chest or abdomen area.  Total time is 1 to 2 hours; you may want to bring reading material for the waiting time.  IF YOU THINK YOU MAY BE PREGNANT, OR ARE NURSING PLEASE INFORM THE TECHNOLOGIST.  In preparation for your appointment, medication and supplies will be purchased.  Appointment availability is limited, so if you need to cancel or reschedule, please call the Radiology Department Scheduler at [phone] 24 hours in advance to avoid a cancellation fee of $100.00  What to Expect When you Arrive:  Once you arrive and check in for your appointment, you will be taken to a preparation room within the Radiology Department.  A technologist or Nurse will obtain your medical history, verify that you are correctly prepped for the exam, and explain the procedure.  Afterwards, an IV will be started in your arm and electrodes will be placed on your skin for EKG monitoring during the stress portion of the exam. Then you will be escorted to the PET/CT scanner.  There, staff will get you positioned on the scanner and obtain a blood pressure and EKG.  During the exam, you will  continue to be connected to the EKG and blood pressure machines.  A small, safe amount of a radioactive tracer will be injected in your IV to obtain a series of pictures of your heart along with an injection of a stress agent.    After your Exam:  It is recommended that you eat a meal and drink a caffeinated beverage to counter act any effects of the stress agent.  Drink plenty of fluids for the remainder of the day and urinate frequently for the first couple of hours after the exam.  Your doctor will inform you of your test results within 7-10 business days.  For more information and frequently asked questions, please visit our website: [email or web address]  For questions about your test or how to prepare for your test, please call: Cardiac Imaging Nurse Navigators Office: [phone]  For billing questions, please call [phone].    Follow-Up: At [facility], you and your health needs are our priority.  As part of our continuing mission to provide you with exceptional heart care, our providers are all part of one team.  This team includes your primary Cardiologist (physician) and Advanced Practice Providers or APPs (Physician Assistants and Nurse Practitioners) who all work together to provide you with the care you need, when you need it.  Your next appointment:   12 month(s)  Provider:   Redell Shallow, MD     Other Instructions             "

## 2024-03-03 ENCOUNTER — Encounter: Payer: Self-pay | Admitting: Cardiology

## 2024-03-03 ENCOUNTER — Ambulatory Visit: Payer: Self-pay | Admitting: Cardiology

## 2024-03-03 LAB — HEPATIC FUNCTION PANEL
ALT: 15 [IU]/L (ref 0–44)
AST: 19 [IU]/L (ref 0–40)
Albumin: 4 g/dL (ref 3.9–4.9)
Alkaline Phosphatase: 67 [IU]/L (ref 47–123)
Bilirubin Total: 0.6 mg/dL (ref 0.0–1.2)
Bilirubin, Direct: 0.29 mg/dL (ref 0.00–0.40)
Total Protein: 6.5 g/dL (ref 6.0–8.5)

## 2024-03-03 LAB — LIPID PANEL
Chol/HDL Ratio: 1.3 ratio (ref 0.0–5.0)
Cholesterol, Total: 106 mg/dL (ref 100–199)
HDL: 83 mg/dL
LDL Chol Calc (NIH): 9 mg/dL (ref 0–99)
Triglycerides: 67 mg/dL (ref 0–149)
VLDL Cholesterol Cal: 14 mg/dL (ref 5–40)
